# Patient Record
Sex: Female | Born: 1955 | Race: White | Hispanic: No | Smoking: Never smoker
Health system: Southern US, Community
[De-identification: ages and names within clinical notes are randomized; demographics above are authoritative.]

## PROBLEM LIST (undated history)

## (undated) DIAGNOSIS — M340 Progressive systemic sclerosis: Secondary | ICD-10-CM

## (undated) DIAGNOSIS — F410 Panic disorder [episodic paroxysmal anxiety] without agoraphobia: Secondary | ICD-10-CM

## (undated) DIAGNOSIS — C9590 Leukemia, unspecified not having achieved remission: Secondary | ICD-10-CM

## (undated) DIAGNOSIS — C9210 Chronic myeloid leukemia, BCR/ABL-positive, not having achieved remission: Secondary | ICD-10-CM

## (undated) DIAGNOSIS — Z9109 Other allergy status, other than to drugs and biological substances: Secondary | ICD-10-CM

## (undated) DIAGNOSIS — E119 Type 2 diabetes mellitus without complications: Secondary | ICD-10-CM

## (undated) DIAGNOSIS — M1990 Unspecified osteoarthritis, unspecified site: Secondary | ICD-10-CM

## (undated) DIAGNOSIS — E785 Hyperlipidemia, unspecified: Secondary | ICD-10-CM

## (undated) DIAGNOSIS — F32A Depression, unspecified: Secondary | ICD-10-CM

## (undated) DIAGNOSIS — D509 Iron deficiency anemia, unspecified: Secondary | ICD-10-CM

## (undated) DIAGNOSIS — I10 Essential (primary) hypertension: Secondary | ICD-10-CM

## (undated) DIAGNOSIS — Z8711 Personal history of peptic ulcer disease: Secondary | ICD-10-CM

## (undated) DIAGNOSIS — Z8719 Personal history of other diseases of the digestive system: Secondary | ICD-10-CM

## (undated) DIAGNOSIS — F329 Major depressive disorder, single episode, unspecified: Secondary | ICD-10-CM

## (undated) HISTORY — DX: Essential (primary) hypertension: I10

## (undated) HISTORY — DX: Hyperlipidemia, unspecified: E78.5

## (undated) HISTORY — DX: Personal history of other diseases of the digestive system: Z87.19

## (undated) HISTORY — PX: OTHER SURGICAL HISTORY: SHX169

## (undated) HISTORY — DX: Chronic myeloid leukemia, BCR/ABL-positive, not having achieved remission: C92.10

## (undated) HISTORY — DX: Panic disorder (episodic paroxysmal anxiety): F41.0

## (undated) HISTORY — PX: CARPAL TUNNEL RELEASE: SHX101

## (undated) HISTORY — PX: PARTIAL HYSTERECTOMY: SHX80

## (undated) HISTORY — DX: Other allergy status, other than to drugs and biological substances: Z91.09

## (undated) HISTORY — DX: Iron deficiency anemia, unspecified: D50.9

## (undated) HISTORY — DX: Progressive systemic sclerosis: M34.0

## (undated) HISTORY — DX: Personal history of peptic ulcer disease: Z87.11

## (undated) HISTORY — DX: Unspecified osteoarthritis, unspecified site: M19.90

## (undated) HISTORY — DX: Depression, unspecified: F32.A

## (undated) HISTORY — DX: Type 2 diabetes mellitus without complications: E11.9

## (undated) HISTORY — DX: Major depressive disorder, single episode, unspecified: F32.9

---

## 1988-02-26 HISTORY — PX: CARDIAC CATHETERIZATION: SHX172

## 2009-11-25 ENCOUNTER — Ambulatory Visit: Payer: Self-pay | Admitting: Internal Medicine

## 2009-12-08 ENCOUNTER — Ambulatory Visit: Payer: Self-pay | Admitting: Internal Medicine

## 2009-12-26 ENCOUNTER — Ambulatory Visit: Payer: Self-pay | Admitting: Internal Medicine

## 2010-01-02 ENCOUNTER — Ambulatory Visit: Payer: Self-pay | Admitting: Pain Medicine

## 2010-01-25 ENCOUNTER — Ambulatory Visit: Payer: Self-pay | Admitting: Internal Medicine

## 2010-02-25 ENCOUNTER — Ambulatory Visit: Payer: Self-pay | Admitting: Internal Medicine

## 2010-03-28 ENCOUNTER — Ambulatory Visit: Payer: Self-pay | Admitting: Internal Medicine

## 2010-04-22 ENCOUNTER — Emergency Department: Payer: Self-pay | Admitting: Emergency Medicine

## 2010-04-26 ENCOUNTER — Ambulatory Visit: Payer: Self-pay | Admitting: Internal Medicine

## 2010-05-08 ENCOUNTER — Ambulatory Visit: Payer: Self-pay | Admitting: Emergency Medicine

## 2010-05-10 ENCOUNTER — Ambulatory Visit: Payer: Self-pay | Admitting: Emergency Medicine

## 2010-05-27 ENCOUNTER — Ambulatory Visit: Payer: Self-pay | Admitting: Internal Medicine

## 2010-06-26 ENCOUNTER — Ambulatory Visit: Payer: Self-pay | Admitting: Internal Medicine

## 2010-08-27 ENCOUNTER — Ambulatory Visit: Payer: Self-pay | Admitting: Internal Medicine

## 2010-09-01 ENCOUNTER — Emergency Department: Payer: Self-pay | Admitting: Emergency Medicine

## 2010-09-02 ENCOUNTER — Emergency Department: Payer: Self-pay | Admitting: Emergency Medicine

## 2010-09-04 ENCOUNTER — Emergency Department: Payer: Self-pay | Admitting: Internal Medicine

## 2010-09-26 ENCOUNTER — Ambulatory Visit: Payer: Self-pay | Admitting: Internal Medicine

## 2010-10-27 ENCOUNTER — Ambulatory Visit: Payer: Self-pay

## 2010-10-27 ENCOUNTER — Ambulatory Visit: Payer: Self-pay | Admitting: Internal Medicine

## 2010-11-26 ENCOUNTER — Encounter: Payer: Self-pay | Admitting: Cardiovascular Disease

## 2010-11-26 ENCOUNTER — Ambulatory Visit: Payer: Self-pay | Admitting: Internal Medicine

## 2010-12-18 ENCOUNTER — Emergency Department: Payer: Self-pay | Admitting: Emergency Medicine

## 2010-12-27 ENCOUNTER — Ambulatory Visit: Payer: Self-pay | Admitting: Internal Medicine

## 2011-01-03 DIAGNOSIS — C9210 Chronic myeloid leukemia, BCR/ABL-positive, not having achieved remission: Secondary | ICD-10-CM | POA: Insufficient documentation

## 2011-01-15 DIAGNOSIS — M549 Dorsalgia, unspecified: Secondary | ICD-10-CM | POA: Insufficient documentation

## 2011-02-11 ENCOUNTER — Ambulatory Visit: Payer: Self-pay | Admitting: Internal Medicine

## 2011-02-26 ENCOUNTER — Ambulatory Visit: Payer: Self-pay | Admitting: Internal Medicine

## 2011-03-08 LAB — BASIC METABOLIC PANEL
Chloride: 105 mmol/L (ref 98–107)
Co2: 26 mmol/L (ref 21–32)
Creatinine: 0.75 mg/dL (ref 0.60–1.30)
EGFR (African American): >60
Osmolality: 284 (ref 275–301)
Potassium: 4.2 mmol/L (ref 3.5–5.1)
Sodium: 141 mmol/L (ref 136–145)

## 2011-03-08 LAB — URIC ACID: Uric Acid: 3.9 mg/dL (ref 2.6–6.0)

## 2011-03-08 LAB — HEPATIC FUNCTION PANEL A (ARMC)
Albumin: 3.3 g/dL — ABNORMAL LOW (ref 3.4–5.0)
Alkaline Phosphatase: 69 U/L (ref 50–136)
SGOT(AST): 20 U/L (ref 15–37)
Total Protein: 7.2 g/dL (ref 6.4–8.2)

## 2011-03-08 LAB — CBC CANCER CENTER
Basophil #: 0 x10 3/mm (ref 0.0–0.1)
Eosinophil #: 0.1 x10 3/mm (ref 0.0–0.7)
Eosinophil %: 1.8 %
HGB: 12.6 g/dL (ref 12.0–16.0)
Lymphocyte #: 1.7 x10 3/mm (ref 1.0–3.6)
MCH: 30.7 pg (ref 26.0–34.0)
MCHC: 33.9 g/dL (ref 32.0–36.0)
Monocyte #: 0.4 x10 3/mm (ref 0.0–0.7)
Neutrophil %: 64.5 %
Platelet: 262 x10 3/mm (ref 150–440)
RBC: 4.11 10*6/uL (ref 3.80–5.20)
RDW: 13.3 % (ref 11.5–14.5)

## 2011-03-08 LAB — MAGNESIUM: Magnesium: 1.5 mg/dL — ABNORMAL LOW

## 2011-03-08 LAB — PHOSPHORUS: Phosphorus: 2.4 mg/dL — ABNORMAL LOW (ref 2.5–4.9)

## 2011-03-15 ENCOUNTER — Ambulatory Visit: Payer: Self-pay | Admitting: Cardiovascular Disease

## 2011-03-26 LAB — CBC CANCER CENTER
Basophil #: 0 x10 3/mm (ref 0.0–0.1)
Eosinophil #: 0.1 x10 3/mm (ref 0.0–0.7)
Lymphocyte #: 1.3 x10 3/mm (ref 1.0–3.6)
Lymphocyte %: 17.1 %
MCV: 91 fL (ref 80–100)
Monocyte #: 0.5 x10 3/mm (ref 0.0–0.7)
Monocyte %: 6.3 %
Platelet: 337 x10 3/mm (ref 150–440)
RDW: 13.2 % (ref 11.5–14.5)
WBC: 7.8 x10 3/mm (ref 3.6–11.0)

## 2011-03-29 ENCOUNTER — Ambulatory Visit: Payer: Self-pay | Admitting: Internal Medicine

## 2011-04-02 ENCOUNTER — Encounter: Payer: Self-pay | Admitting: Cardiovascular Disease

## 2011-04-02 ENCOUNTER — Encounter: Payer: Self-pay | Admitting: *Deleted

## 2011-04-02 ENCOUNTER — Ambulatory Visit (INDEPENDENT_AMBULATORY_CARE_PROVIDER_SITE_OTHER): Payer: Medicaid Other | Admitting: Cardiovascular Disease

## 2011-04-02 VITALS — BP 141/85 | HR 91 | Ht 67.0 in | Wt 250.0 lb

## 2011-04-02 DIAGNOSIS — F419 Anxiety disorder, unspecified: Secondary | ICD-10-CM | POA: Insufficient documentation

## 2011-04-02 DIAGNOSIS — F329 Major depressive disorder, single episode, unspecified: Secondary | ICD-10-CM

## 2011-04-02 DIAGNOSIS — F341 Dysthymic disorder: Secondary | ICD-10-CM

## 2011-04-02 DIAGNOSIS — R079 Chest pain, unspecified: Secondary | ICD-10-CM

## 2011-04-02 DIAGNOSIS — G8929 Other chronic pain: Secondary | ICD-10-CM | POA: Insufficient documentation

## 2011-04-02 DIAGNOSIS — R0789 Other chest pain: Secondary | ICD-10-CM | POA: Insufficient documentation

## 2011-04-02 DIAGNOSIS — R9431 Abnormal electrocardiogram [ECG] [EKG]: Secondary | ICD-10-CM | POA: Insufficient documentation

## 2011-04-02 DIAGNOSIS — I10 Essential (primary) hypertension: Secondary | ICD-10-CM | POA: Insufficient documentation

## 2011-04-02 NOTE — Patient Instructions (Signed)
You are doing well. No medication changes were made. Please monitor your blood pressure if possible  Please call us if you have new issues that need to be addressed

## 2011-04-02 NOTE — Progress Notes (Signed)
Patient ID: Tara Levine, female    DOB: 11-01-1955, 56 y.o.   MRN: [id number]  HPI Comments: Tara Levine is a 56 year old woman, seen to establish care with Dr. 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 She reports having a cardiac catheterization 5 years ago that showed no significant coronary artery disease. This was done in [place]. She presents for abnormal EKG and chest pain.  Her main complaint today is her inability for anyone to listen to her and provide pain medications for her back pain. She was told that she could not be seen in pain clinic. She is changing primary care physicians. She does have psychiatry though they do not provide pain medication though do provide her benzodiazepines. She needs Xanax for sleep. She reports doing well previously on oxycodone.   Chest pain episodes are rare, seemed to come on at rest, nonexertional. She reports her mother had bypass surgery in her 70s.  EKG shows normal sinus rhythm with rate 92 beats per minute with poor R-wave progression through the anterior precordial leads, left axis deviation, no other significant abnormalities. This is unchanged from EKG at [facility] in January 2013.    Outpatient Encounter Prescriptions as of 04/02/2011  Medication Sig Dispense Refill  . ALPRAZolam (XANAX) 0.5 MG tablet Take 0.5 mg by mouth 3 (three) times daily as needed.      . cetirizine (ZYRTEC) 10 MG tablet Take 10 mg by mouth as needed.      . diphenhydrAMINE (BENADRYL) 25 MG tablet Take 25 mg by mouth daily.      Marland Kitchen docusate sodium (COLACE) 100 MG capsule Take 100 mg by mouth as directed.      . DULoxetine (CYMBALTA) 20 MG capsule Take 20 mg by mouth 3 (three) times daily.      Marland Kitchen  estradiol (ESTRACE) 2 MG tablet Take 2 mg by mouth daily.      . ferrous sulfate 325 (65 FE) MG tablet Take 325 mg by mouth.      . furosemide (LASIX) 20 MG tablet Take 20 mg by mouth daily as needed.      . hydrocortisone (ANUSOL-HC) 2.5 % rectal cream Place 1 application rectally daily.      . meclizine (ANTIVERT) 25 MG tablet Take 25 mg by mouth 3 (three) times daily as needed.      . Naproxen Sodium (ALEVE) 220 MG CAPS Take by mouth as needed.      . nilotinib (TASIGNA) 150 MG capsule Take 300 mg by mouth every 12 (twelve) hours.      Marland Kitchen olopatadine (PATANOL) 0.1 % ophthalmic solution Apply 1 drop to eye 2 (two) times daily.      . promethazine (PHENERGAN) 25 MG tablet Take 25 mg by mouth every 6 (six) hours as needed.      . senna (SENOKOT) 8.6 MG TABS Take 2 tablets by mouth.       . carisoprodol (SOMA) 350 MG tablet Take 350 mg by mouth 4 (four) times daily as needed. HAS NOT HAD RX IN 3 MONTH      . fluticasone (FLONASE) 50 MCG/ACT nasal spray Place 1 spray into the nose daily.      Marland Kitchen oxyCODONE (ROXICODONE) 15 MG immediate release tablet  Take 15 mg by mouth every 6 (six) hours as needed. HAS NOT HAD RX IN 3 MONTHS         Review of Systems  Constitutional: Positive for unexpected weight change.  HENT: Negative.   Eyes: Negative.   Respiratory: Negative.   Cardiovascular: Positive for chest pain.  Gastrointestinal: Negative.   Musculoskeletal: Positive for back pain, arthralgias and gait problem.  Skin: Negative.   Neurological: Negative.   Hematological: Negative.   Psychiatric/Behavioral: Negative.   All other systems reviewed and are negative.    BP 141/85  Pulse 91  Ht 5\' 7"  (1.702 m)  Wt 250 lb (113.399 kg)  BMI 39.16 kg/m2  Physical Exam  Nursing note and vitals reviewed. Constitutional: She is oriented to person, place, and time. She appears well-developed and well-nourished.  HENT:  Head: Normocephalic.  Nose: Nose normal.  Mouth/Throat: Oropharynx is clear  and moist.  Eyes: Conjunctivae are normal. Pupils are equal, round, and reactive to light.  Neck: Normal range of motion. Neck supple. No JVD present.  Cardiovascular: Normal rate, regular rhythm, S1 normal, S2 normal, normal heart sounds and intact distal pulses.  Exam reveals no gallop and no friction rub.   No murmur heard. Pulmonary/Chest: Effort normal and breath sounds normal. No respiratory distress. She has no wheezes. She has no rales. She exhibits no tenderness.  Abdominal: Soft. Bowel sounds are normal. She exhibits no distension. There is no tenderness.  Musculoskeletal: Normal range of motion. She exhibits no edema and no tenderness.  Lymphadenopathy:    She has no cervical adenopathy.  Neurological: She is alert and oriented to person, place, and time. Coordination normal.  Skin: Skin is warm and dry. No rash noted. No erythema.  Psychiatric: She has a normal mood and affect. Her behavior is normal. Judgment and thought content normal.         Assessment and Plan

## 2011-04-02 NOTE — Assessment & Plan Note (Signed)
History of chronic pain. No pain medications by her report over the past several months. She is trying to identify a source that can provide oxycodone for her symptoms.

## 2011-04-02 NOTE — Assessment & Plan Note (Signed)
EKG is essentially benign on today's visit and on EKG in January at [facility]. She has left axis deviation. 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

## 2011-04-02 NOTE — Assessment & Plan Note (Signed)
Chest pain symptoms are very rare, typically not associated with exertion. She reports recent cardiac catheterization history several years ago in [place] showing no significant coronary artery disease. We will confirm this and have requested her records. If in fact she has no history of significant coronary artery disease by recent cardiac catheterization, no further workup would be needed at this time. Symptoms are very atypical and in the setting of insignificant CAD, no further workup would be needed.

## 2011-04-02 NOTE — Assessment & Plan Note (Signed)
Blood pressure is borderline elevated today. She reports being on an ACE inhibitor previously. We have suggested she closely monitor her blood pressure and present her measurements to Dr. Leavy Cella.
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
## 2011-04-16 LAB — CBC CANCER CENTER
Basophil #: 0 x10 3/mm (ref 0.0–0.1)
Basophil %: 0.3 %
HCT: 38 % (ref 35.0–47.0)
Lymphocyte %: 23.3 %
Monocyte %: 6.4 %
Neutrophil #: 5.3 x10 3/mm (ref 1.4–6.5)
RBC: 4.18 10*6/uL (ref 3.80–5.20)
RDW: 14.4 % (ref 11.5–14.5)

## 2011-04-26 ENCOUNTER — Ambulatory Visit: Payer: Self-pay | Admitting: Internal Medicine

## 2011-05-27 DIAGNOSIS — E7800 Pure hypercholesterolemia, unspecified: Secondary | ICD-10-CM | POA: Insufficient documentation

## 2011-05-31 ENCOUNTER — Ambulatory Visit: Payer: Self-pay | Admitting: Internal Medicine

## 2011-05-31 LAB — CBC CANCER CENTER
Basophil %: 0.2 %
Eosinophil #: 0.2 x10 3/mm (ref 0.0–0.7)
HGB: 13.1 g/dL (ref 12.0–16.0)
Lymphocyte %: 20.5 %
MCHC: 33.5 g/dL (ref 32.0–36.0)
MCV: 91 fL (ref 80–100)
Monocyte #: 0.4 x10 3/mm (ref 0.0–0.7)
Monocyte %: 4.2 %
Neutrophil %: 72.7 %
RDW: 14.1 % (ref 11.5–14.5)

## 2011-05-31 LAB — MAGNESIUM: Magnesium: 1.7 mg/dL — ABNORMAL LOW

## 2011-05-31 LAB — POTASSIUM: Potassium: 4.2 mmol/L (ref 3.5–5.1)

## 2011-06-04 ENCOUNTER — Ambulatory Visit: Payer: Self-pay | Admitting: Pain Medicine

## 2011-06-26 ENCOUNTER — Ambulatory Visit: Payer: Self-pay | Admitting: Internal Medicine

## 2011-06-28 LAB — BASIC METABOLIC PANEL
Calcium, Total: 9.8 mg/dL (ref 8.5–10.1)
Chloride: 103 mmol/L (ref 98–107)
Creatinine: 0.81 mg/dL (ref 0.60–1.30)
EGFR (Non-African Amer.): >60

## 2011-06-28 LAB — CBC CANCER CENTER
Basophil #: 0 x10 3/mm (ref 0.0–0.1)
Eosinophil %: 3.4 %
HCT: 39.2 % (ref 35.0–47.0)
HGB: 13 g/dL (ref 12.0–16.0)
Lymphocyte #: 1.7 x10 3/mm (ref 1.0–3.6)
Lymphocyte %: 21.5 %
MCV: 92 fL (ref 80–100)
Monocyte #: 0.4 x10 3/mm (ref 0.2–0.9)
Monocyte %: 5.8 %
Neutrophil #: 5.3 x10 3/mm (ref 1.4–6.5)
Platelet: 266 x10 3/mm (ref 150–440)
RDW: 13.3 % (ref 11.5–14.5)

## 2011-06-28 LAB — HEPATIC FUNCTION PANEL A (ARMC)
Albumin: 3.7 g/dL (ref 3.4–5.0)
Bilirubin, Direct: 0.2 mg/dL (ref 0.00–0.20)
Bilirubin,Total: 0.3 mg/dL (ref 0.2–1.0)
SGOT(AST): 30 U/L (ref 15–37)
SGPT (ALT): 53 U/L
Total Protein: 7.4 g/dL (ref 6.4–8.2)

## 2011-06-28 LAB — PHOSPHORUS: Phosphorus: 2.4 mg/dL — ABNORMAL LOW (ref 2.5–4.9)

## 2011-07-26 LAB — CBC CANCER CENTER
Basophil #: 0 x10 3/mm (ref 0.0–0.1)
Basophil %: 0.2 %
Eosinophil #: 0.2 x10 3/mm (ref 0.0–0.7)
Eosinophil %: 2 %
HCT: 39.9 % (ref 35.0–47.0)
Lymphocyte #: 1.6 x10 3/mm (ref 1.0–3.6)
Lymphocyte %: 15.9 %
MCH: 30.1 pg (ref 26.0–34.0)
MCHC: 33.1 g/dL (ref 32.0–36.0)
MCV: 91 fL (ref 80–100)
Monocyte %: 4.8 %
Neutrophil %: 77.1 %
Platelet: 263 x10 3/mm (ref 150–440)
RDW: 13.3 % (ref 11.5–14.5)
WBC: 10.4 x10 3/mm (ref 3.6–11.0)

## 2011-07-26 LAB — BASIC METABOLIC PANEL
Anion Gap: 13 (ref 7–16)
Calcium, Total: 9.8 mg/dL (ref 8.5–10.1)
Co2: 22 mmol/L (ref 21–32)
Creatinine: 0.87 mg/dL (ref 0.60–1.30)
EGFR (African American): >60
EGFR (Non-African Amer.): >60
Potassium: 4.1 mmol/L (ref 3.5–5.1)
Sodium: 139 mmol/L (ref 136–145)

## 2011-07-26 LAB — HEPATIC FUNCTION PANEL A (ARMC)
Alkaline Phosphatase: 57 U/L (ref 50–136)
Bilirubin, Direct: <0.1 mg/dL (ref 0.00–0.20)
SGOT(AST): 25 U/L (ref 15–37)
SGPT (ALT): 27 U/L
Total Protein: 7.7 g/dL (ref 6.4–8.2)

## 2011-07-26 LAB — MAGNESIUM: Magnesium: 1.3 mg/dL — ABNORMAL LOW

## 2011-07-27 ENCOUNTER — Ambulatory Visit: Payer: Self-pay | Admitting: Internal Medicine

## 2011-08-23 LAB — BASIC METABOLIC PANEL
BUN: 22 mg/dL — ABNORMAL HIGH (ref 7–18)
Calcium, Total: 9.2 mg/dL (ref 8.5–10.1)
Chloride: 104 mmol/L (ref 98–107)
Creatinine: 0.8 mg/dL (ref 0.60–1.30)
EGFR (African American): >60
Glucose: 143 mg/dL — ABNORMAL HIGH (ref 65–99)
Potassium: 4.6 mmol/L (ref 3.5–5.1)

## 2011-08-23 LAB — CBC CANCER CENTER
Basophil %: 0.3 %
Eosinophil #: 0.2 x10 3/mm (ref 0.0–0.7)
HCT: 37.6 % (ref 35.0–47.0)
Lymphocyte %: 22 %
MCH: 29.3 pg (ref 26.0–34.0)
MCHC: 32.3 g/dL (ref 32.0–36.0)
MCV: 91 fL (ref 80–100)
Monocyte %: 5.8 %
Neutrophil #: 5.2 x10 3/mm (ref 1.4–6.5)
Neutrophil %: 69.4 %
Platelet: 233 x10 3/mm (ref 150–440)
RDW: 13.5 % (ref 11.5–14.5)

## 2011-08-23 LAB — HEPATIC FUNCTION PANEL A (ARMC)
Albumin: 3.3 g/dL — ABNORMAL LOW (ref 3.4–5.0)
Alkaline Phosphatase: 75 U/L (ref 50–136)
Bilirubin,Total: 0.3 mg/dL (ref 0.2–1.0)
SGOT(AST): 24 U/L (ref 15–37)
SGPT (ALT): 35 U/L

## 2011-08-23 LAB — MAGNESIUM: Magnesium: 1.9 mg/dL

## 2011-08-23 LAB — PHOSPHORUS: Phosphorus: 2.2 mg/dL — ABNORMAL LOW (ref 2.5–4.9)

## 2011-08-26 ENCOUNTER — Ambulatory Visit: Payer: Self-pay | Admitting: Internal Medicine

## 2011-09-20 LAB — HEPATIC FUNCTION PANEL A (ARMC)
Bilirubin, Direct: 0.1 mg/dL (ref 0.00–0.20)
Bilirubin,Total: 0.4 mg/dL (ref 0.2–1.0)
SGOT(AST): 19 U/L (ref 15–37)
Total Protein: 7.8 g/dL (ref 6.4–8.2)

## 2011-09-20 LAB — CBC CANCER CENTER
Basophil #: 0 x10 3/mm (ref 0.0–0.1)
Eosinophil #: 0.2 x10 3/mm (ref 0.0–0.7)
Eosinophil %: 2.2 %
HCT: 38.3 % (ref 35.0–47.0)
Lymphocyte #: 1.8 x10 3/mm (ref 1.0–3.6)
MCV: 90 fL (ref 80–100)
Monocyte #: 0.6 x10 3/mm (ref 0.2–0.9)
Monocyte %: 5.9 %
Neutrophil #: 6.8 x10 3/mm — ABNORMAL HIGH (ref 1.4–6.5)
Platelet: 279 x10 3/mm (ref 150–440)
RBC: 4.27 10*6/uL (ref 3.80–5.20)
WBC: 9.3 x10 3/mm (ref 3.6–11.0)

## 2011-09-20 LAB — BASIC METABOLIC PANEL
Anion Gap: 10 (ref 7–16)
Calcium, Total: 9.1 mg/dL (ref 8.5–10.1)
Chloride: 102 mmol/L (ref 98–107)
Co2: 24 mmol/L (ref 21–32)
EGFR (African American): >60
EGFR (Non-African Amer.): >60
Osmolality: 278 (ref 275–301)
Potassium: 4.4 mmol/L (ref 3.5–5.1)
Sodium: 136 mmol/L (ref 136–145)

## 2011-09-20 LAB — MAGNESIUM: Magnesium: 1.9 mg/dL

## 2011-09-20 LAB — PHOSPHORUS: Phosphorus: 2.3 mg/dL — ABNORMAL LOW (ref 2.5–4.9)

## 2011-09-26 ENCOUNTER — Ambulatory Visit: Payer: Self-pay | Admitting: Internal Medicine

## 2011-10-09 ENCOUNTER — Observation Stay: Payer: Self-pay | Admitting: Internal Medicine

## 2011-10-09 DIAGNOSIS — R079 Chest pain, unspecified: Secondary | ICD-10-CM

## 2011-10-09 DIAGNOSIS — R9431 Abnormal electrocardiogram [ECG] [EKG]: Secondary | ICD-10-CM

## 2011-10-09 LAB — COMPREHENSIVE METABOLIC PANEL
Albumin: 3.2 g/dL — ABNORMAL LOW (ref 3.4–5.0)
Anion Gap: 8 (ref 7–16)
BUN: 22 mg/dL — ABNORMAL HIGH (ref 7–18)
Bilirubin,Total: 0.2 mg/dL (ref 0.2–1.0)
Calcium, Total: 9.3 mg/dL (ref 8.5–10.1)
Chloride: 100 mmol/L (ref 98–107)
Co2: 29 mmol/L (ref 21–32)
Creatinine: 1.06 mg/dL (ref 0.60–1.30)
EGFR (African American): >60
EGFR (Non-African Amer.): 59 — ABNORMAL LOW
Glucose: 161 mg/dL — ABNORMAL HIGH (ref 65–99)
Potassium: 4.2 mmol/L (ref 3.5–5.1)
SGOT(AST): 22 U/L (ref 15–37)
SGPT (ALT): 28 U/L (ref 12–78)
Total Protein: 7.4 g/dL (ref 6.4–8.2)

## 2011-10-09 LAB — CBC CANCER CENTER
Basophil #: 0 x10 3/mm (ref 0.0–0.1)
Eosinophil #: 0.3 x10 3/mm (ref 0.0–0.7)
Eosinophil %: 4.2 %
HCT: 38.7 % (ref 35.0–47.0)
HGB: 12.6 g/dL (ref 12.0–16.0)
Lymphocyte #: 1.5 x10 3/mm (ref 1.0–3.6)
Lymphocyte %: 20.9 %
MCH: 29.2 pg (ref 26.0–34.0)
MCHC: 32.4 g/dL (ref 32.0–36.0)
MCV: 90 fL (ref 80–100)
Monocyte #: 0.4 x10 3/mm (ref 0.2–0.9)
Neutrophil %: 68.8 %
Platelet: 276 x10 3/mm (ref 150–440)
RBC: 4.3 10*6/uL (ref 3.80–5.20)
RDW: 13.8 % (ref 11.5–14.5)

## 2011-10-09 LAB — CK TOTAL AND CKMB (NOT AT ARMC)
CK, Total: 80 U/L (ref 21–215)
CK-MB: 1.4 ng/mL (ref 0.5–3.6)

## 2011-10-09 LAB — TROPONIN I
Troponin-I: <0.02 ng/mL
Troponin-I: <0.02 ng/mL

## 2011-10-09 LAB — MAGNESIUM: Magnesium: 1.7 mg/dL — ABNORMAL LOW

## 2011-10-10 DIAGNOSIS — R0602 Shortness of breath: Secondary | ICD-10-CM

## 2011-10-10 LAB — CBC WITH DIFFERENTIAL/PLATELET
HCT: 36.3 % (ref 35.0–47.0)
Lymphocyte #: 2.2 10*3/uL (ref 1.0–3.6)
MCH: 29.9 pg (ref 26.0–34.0)
MCHC: 33.6 g/dL (ref 32.0–36.0)
MCV: 89 fL (ref 80–100)
Monocyte #: 0.7 x10 3/mm (ref 0.2–0.9)
Neutrophil #: 4.5 10*3/uL (ref 1.4–6.5)
RDW: 13.4 % (ref 11.5–14.5)

## 2011-10-10 LAB — MAGNESIUM: Magnesium: 1.8 mg/dL

## 2011-10-10 LAB — BASIC METABOLIC PANEL
Anion Gap: 6 — ABNORMAL LOW (ref 7–16)
BUN: 27 mg/dL — ABNORMAL HIGH (ref 7–18)
Calcium, Total: 8.5 mg/dL (ref 8.5–10.1)
Co2: 28 mmol/L (ref 21–32)
EGFR (African American): >60
EGFR (Non-African Amer.): >60
Glucose: 128 mg/dL — ABNORMAL HIGH (ref 65–99)
Osmolality: 279 (ref 275–301)
Potassium: 4.2 mmol/L (ref 3.5–5.1)

## 2011-10-10 LAB — CK TOTAL AND CKMB (NOT AT ARMC): CK, Total: 75 U/L (ref 21–215)

## 2011-10-10 LAB — TROPONIN I: Troponin-I: <0.02 ng/mL

## 2011-10-18 LAB — BASIC METABOLIC PANEL WITH GFR
Anion Gap: 8
BUN: 23 mg/dL — ABNORMAL HIGH
Calcium, Total: 9.5 mg/dL
Chloride: 100 mmol/L
Co2: 28 mmol/L
Creatinine: 1.08 mg/dL
EGFR (African American): >60
EGFR (Non-African Amer.): 57 — ABNORMAL LOW
Glucose: 113 mg/dL — ABNORMAL HIGH
Osmolality: 276
Potassium: 4.5 mmol/L
Sodium: 136 mmol/L

## 2011-10-18 LAB — CBC CANCER CENTER
Basophil %: 0.2 %
Eosinophil #: 0.3 x10 3/mm (ref 0.0–0.7)
Eosinophil %: 2.6 %
HCT: 39.2 % (ref 35.0–47.0)
HGB: 12.8 g/dL (ref 12.0–16.0)
Lymphocyte %: 23.4 %
MCH: 29.2 pg (ref 26.0–34.0)
MCV: 90 fL (ref 80–100)
Monocyte #: 0.6 x10 3/mm (ref 0.2–0.9)
Monocyte %: 6.2 %
Neutrophil #: 6.8 x10 3/mm — ABNORMAL HIGH (ref 1.4–6.5)
Neutrophil %: 67.6 %
Platelet: 264 x10 3/mm (ref 150–440)
RBC: 4.37 10*6/uL (ref 3.80–5.20)
WBC: 10 x10 3/mm (ref 3.6–11.0)

## 2011-10-18 LAB — HEPATIC FUNCTION PANEL A (ARMC)
Albumin: 3.4 g/dL
Alkaline Phosphatase: 76 U/L
Bilirubin, Direct: 0.1 mg/dL
Bilirubin,Total: 0.2 mg/dL
SGOT(AST): 20 U/L
SGPT (ALT): 30 U/L
Total Protein: 7.6 g/dL

## 2011-10-18 LAB — MAGNESIUM: Magnesium: 1.6 mg/dL — ABNORMAL LOW

## 2011-10-18 LAB — PHOSPHORUS: Phosphorus: 2.7 mg/dL (ref 2.5–4.9)

## 2011-10-27 ENCOUNTER — Ambulatory Visit: Payer: Self-pay | Admitting: Internal Medicine

## 2011-10-31 ENCOUNTER — Encounter: Payer: Self-pay | Admitting: Cardiovascular Disease

## 2011-11-01 LAB — HEPATIC FUNCTION PANEL A (ARMC)
Albumin: 3.4 g/dL (ref 3.4–5.0)
Alkaline Phosphatase: 59 U/L (ref 50–136)
Bilirubin,Total: 0.4 mg/dL (ref 0.2–1.0)
SGOT(AST): 19 U/L (ref 15–37)
SGPT (ALT): 22 U/L (ref 12–78)
Total Protein: 7.6 g/dL (ref 6.4–8.2)

## 2011-11-01 LAB — CBC CANCER CENTER
Basophil %: 0.4 %
Eosinophil #: 0.2 x10 3/mm (ref 0.0–0.7)
Eosinophil %: 2.5 %
HCT: 37.3 % (ref 35.0–47.0)
HGB: 12.4 g/dL (ref 12.0–16.0)
Lymphocyte %: 19.9 %
MCH: 29.3 pg (ref 26.0–34.0)
MCHC: 33.1 g/dL (ref 32.0–36.0)
Monocyte #: 0.5 x10 3/mm (ref 0.2–0.9)
Neutrophil #: 6.2 x10 3/mm (ref 1.4–6.5)
RBC: 4.22 10*6/uL (ref 3.80–5.20)

## 2011-11-01 LAB — CREATININE, SERUM: EGFR (Non-African Amer.): 59 — ABNORMAL LOW

## 2011-11-01 LAB — POTASSIUM: Potassium: 4.4 mmol/L (ref 3.5–5.1)

## 2011-11-26 ENCOUNTER — Ambulatory Visit: Payer: Self-pay | Admitting: Internal Medicine

## 2011-11-29 ENCOUNTER — Emergency Department: Payer: Self-pay | Admitting: Unknown Physician Specialty

## 2011-11-29 LAB — URINALYSIS, COMPLETE
Bacteria: NONE SEEN
Bilirubin,UR: NEGATIVE
Glucose,UR: NEGATIVE mg/dL (ref 0–75)
Hyaline Cast: 4
Ketone: NEGATIVE
Leukocyte Esterase: NEGATIVE
Ph: 6 (ref 4.5–8.0)
RBC,UR: 1 /HPF (ref 0–5)
Squamous Epithelial: 1
WBC UR: <1 /HPF (ref 0–5)

## 2011-11-29 LAB — CBC CANCER CENTER
Basophil #: 0 x10 3/mm (ref 0.0–0.1)
Basophil %: 0.3 %
Eosinophil #: 0.2 x10 3/mm (ref 0.0–0.7)
Eosinophil %: 1.4 %
HCT: 39.5 % (ref 35.0–47.0)
HGB: 12.9 g/dL (ref 12.0–16.0)
Lymphocyte #: 2 x10 3/mm (ref 1.0–3.6)
Lymphocyte %: 17.3 %
MCH: 29 pg (ref 26.0–34.0)
MCV: 89 fL (ref 80–100)
Monocyte #: 0.7 x10 3/mm (ref 0.2–0.9)
Monocyte %: 5.8 %
Neutrophil #: 8.7 x10 3/mm — ABNORMAL HIGH (ref 1.4–6.5)
Neutrophil %: 75.2 %
RBC: 4.44 10*6/uL (ref 3.80–5.20)
RDW: 13.8 % (ref 11.5–14.5)
WBC: 11.6 x10 3/mm — ABNORMAL HIGH (ref 3.6–11.0)

## 2011-11-29 LAB — CBC
MCH: 28.8 pg (ref 26.0–34.0)
MCHC: 32.7 g/dL (ref 32.0–36.0)
Platelet: 303 10*3/uL (ref 150–440)
RBC: 4.31 10*6/uL (ref 3.80–5.20)

## 2011-11-29 LAB — COMPREHENSIVE METABOLIC PANEL
Albumin: 3.5 g/dL (ref 3.4–5.0)
Alkaline Phosphatase: 68 U/L (ref 50–136)
BUN: 16 mg/dL (ref 7–18)
Bilirubin,Total: 0.4 mg/dL (ref 0.2–1.0)
Calcium, Total: 9.5 mg/dL (ref 8.5–10.1)
Creatinine: 1.04 mg/dL (ref 0.60–1.30)
EGFR (African American): >60
Glucose: 105 mg/dL — ABNORMAL HIGH (ref 65–99)
SGOT(AST): 27 U/L (ref 15–37)
SGPT (ALT): 26 U/L (ref 12–78)
Total Protein: 7.7 g/dL (ref 6.4–8.2)

## 2011-11-29 LAB — TROPONIN I: Troponin-I: <0.02 ng/mL

## 2011-11-29 LAB — LIPASE, BLOOD: Lipase: 138 U/L (ref 73–393)

## 2011-12-13 LAB — CBC CANCER CENTER
Basophil #: 0 x10 3/mm (ref 0.0–0.1)
Eosinophil #: 0.3 x10 3/mm (ref 0.0–0.7)
HCT: 36.3 % (ref 35.0–47.0)
Lymphocyte %: 23.2 %
MCH: 28.8 pg (ref 26.0–34.0)
MCHC: 32.2 g/dL (ref 32.0–36.0)
Neutrophil #: 4.3 x10 3/mm (ref 1.4–6.5)
Platelet: 269 x10 3/mm (ref 150–440)
RDW: 13.8 % (ref 11.5–14.5)
WBC: 6.6 x10 3/mm (ref 3.6–11.0)

## 2011-12-13 LAB — MAGNESIUM: Magnesium: 1.6 mg/dL — ABNORMAL LOW

## 2011-12-13 LAB — HEPATIC FUNCTION PANEL A (ARMC)
Bilirubin, Direct: 0.1 mg/dL (ref 0.00–0.20)
Bilirubin,Total: 0.4 mg/dL (ref 0.2–1.0)
SGPT (ALT): 23 U/L (ref 12–78)
Total Protein: 7.5 g/dL (ref 6.4–8.2)

## 2011-12-13 LAB — POTASSIUM: Potassium: 4.4 mmol/L (ref 3.5–5.1)

## 2011-12-13 LAB — CREATININE, SERUM
Creatinine: 0.98 mg/dL (ref 0.60–1.30)
EGFR (Non-African Amer.): >60

## 2011-12-13 LAB — PHOSPHORUS: Phosphorus: 2.3 mg/dL — ABNORMAL LOW (ref 2.5–4.9)

## 2011-12-27 ENCOUNTER — Ambulatory Visit: Payer: Self-pay

## 2011-12-27 ENCOUNTER — Ambulatory Visit: Payer: Self-pay | Admitting: Internal Medicine

## 2012-01-15 LAB — CBC CANCER CENTER
Basophil #: 0 x10 3/mm (ref 0.0–0.1)
HCT: 36.6 % (ref 35.0–47.0)
HGB: 12.2 g/dL (ref 12.0–16.0)
Lymphocyte #: 1.7 x10 3/mm (ref 1.0–3.6)
MCHC: 33.5 g/dL (ref 32.0–36.0)
MCV: 89 fL (ref 80–100)
Monocyte #: 0.4 x10 3/mm (ref 0.2–0.9)
Monocyte %: 4.5 %
Neutrophil #: 6.1 x10 3/mm (ref 1.4–6.5)
RDW: 13.6 % (ref 11.5–14.5)

## 2012-01-24 LAB — CBC CANCER CENTER
Basophil %: 0.4 %
Eosinophil #: 0.2 x10 3/mm (ref 0.0–0.7)
HCT: 34.2 % — ABNORMAL LOW (ref 35.0–47.0)
Lymphocyte #: 1.7 x10 3/mm (ref 1.0–3.6)
MCH: 29.2 pg (ref 26.0–34.0)
MCHC: 32.9 g/dL (ref 32.0–36.0)
Monocyte #: 0.5 x10 3/mm (ref 0.2–0.9)
Neutrophil #: 5.9 x10 3/mm (ref 1.4–6.5)
Neutrophil %: 70.9 %
Platelet: 245 x10 3/mm (ref 150–440)
RBC: 3.85 10*6/uL (ref 3.80–5.20)

## 2012-01-24 LAB — CREATININE, SERUM
EGFR (African American): 51 — ABNORMAL LOW
EGFR (Non-African Amer.): 44 — ABNORMAL LOW

## 2012-01-24 LAB — HEPATIC FUNCTION PANEL A (ARMC): Bilirubin, Direct: 0.1 mg/dL (ref 0.00–0.20)

## 2012-01-26 ENCOUNTER — Ambulatory Visit: Payer: Self-pay | Admitting: Internal Medicine

## 2012-02-07 LAB — URINALYSIS, COMPLETE
Bacteria: NONE SEEN
Glucose,UR: NEGATIVE mg/dL (ref 0–75)
Hyaline Cast: 3
Leukocyte Esterase: NEGATIVE
Nitrite: NEGATIVE
Ph: 5 (ref 4.5–8.0)
Specific Gravity: 1.006 (ref 1.003–1.030)
Squamous Epithelial: NONE SEEN
WBC UR: 1 /HPF (ref 0–5)

## 2012-02-07 LAB — HEPATIC FUNCTION PANEL A (ARMC)
Albumin: 3.2 g/dL — ABNORMAL LOW (ref 3.4–5.0)
Alkaline Phosphatase: 66 U/L (ref 50–136)
Bilirubin,Total: 0.5 mg/dL (ref 0.2–1.0)
SGOT(AST): 22 U/L (ref 15–37)
SGPT (ALT): 26 U/L (ref 12–78)
Total Protein: 7.3 g/dL (ref 6.4–8.2)

## 2012-02-07 LAB — CBC CANCER CENTER
Basophil #: 0 x10 3/mm (ref 0.0–0.1)
Lymphocyte #: 1.7 x10 3/mm (ref 1.0–3.6)
MCH: 29.7 pg (ref 26.0–34.0)
MCV: 88 fL (ref 80–100)
Monocyte #: 0.6 x10 3/mm (ref 0.2–0.9)
Monocyte %: 3.9 %
Neutrophil %: 82.7 %
Platelet: 290 x10 3/mm (ref 150–440)
RDW: 13.2 % (ref 11.5–14.5)
WBC: 15 x10 3/mm — ABNORMAL HIGH (ref 3.6–11.0)

## 2012-02-07 LAB — BASIC METABOLIC PANEL
BUN: 17 mg/dL (ref 7–18)
Calcium, Total: 9.3 mg/dL (ref 8.5–10.1)
Chloride: 99 mmol/L (ref 98–107)
Co2: 24 mmol/L (ref 21–32)
Creatinine: 1.2 mg/dL (ref 0.60–1.30)
EGFR (African American): 59 — ABNORMAL LOW
Potassium: 4.2 mmol/L (ref 3.5–5.1)
Sodium: 134 mmol/L — ABNORMAL LOW (ref 136–145)

## 2012-02-07 LAB — MAGNESIUM: Magnesium: 1.5 mg/dL — ABNORMAL LOW

## 2012-02-08 LAB — URINE CULTURE

## 2012-02-17 DIAGNOSIS — M545 Low back pain, unspecified: Secondary | ICD-10-CM | POA: Insufficient documentation

## 2012-02-17 DIAGNOSIS — M542 Cervicalgia: Secondary | ICD-10-CM | POA: Insufficient documentation

## 2012-02-26 ENCOUNTER — Ambulatory Visit: Payer: Self-pay | Admitting: Internal Medicine

## 2012-03-26 DIAGNOSIS — L03319 Cellulitis of trunk, unspecified: Secondary | ICD-10-CM | POA: Insufficient documentation

## 2012-04-02 ENCOUNTER — Ambulatory Visit: Payer: Self-pay | Admitting: Internal Medicine

## 2012-04-02 ENCOUNTER — Ambulatory Visit: Payer: Self-pay | Admitting: Unknown Physician Specialty

## 2012-04-03 ENCOUNTER — Ambulatory Visit: Payer: Self-pay | Admitting: Internal Medicine

## 2012-04-03 LAB — HEPATIC FUNCTION PANEL A (ARMC)
Albumin: 3.3 g/dL — ABNORMAL LOW (ref 3.4–5.0)
Alkaline Phosphatase: 73 U/L (ref 50–136)
Bilirubin,Total: 0.3 mg/dL (ref 0.2–1.0)
SGOT(AST): 27 U/L (ref 15–37)
Total Protein: 7.7 g/dL (ref 6.4–8.2)

## 2012-04-03 LAB — CBC CANCER CENTER
Basophil #: 0 x10 3/mm (ref 0.0–0.1)
Eosinophil #: 0.2 x10 3/mm (ref 0.0–0.7)
Eosinophil %: 2.3 %
HCT: 37.9 % (ref 35.0–47.0)
MCH: 29.3 pg (ref 26.0–34.0)
MCHC: 33.5 g/dL (ref 32.0–36.0)
MCV: 87 fL (ref 80–100)
Monocyte #: 0.4 x10 3/mm (ref 0.2–0.9)
Neutrophil %: 75.1 %
Platelet: 294 x10 3/mm (ref 150–440)
RBC: 4.33 10*6/uL (ref 3.80–5.20)
RDW: 13.5 % (ref 11.5–14.5)
WBC: 8 x10 3/mm (ref 3.6–11.0)

## 2012-04-03 LAB — BASIC METABOLIC PANEL
Anion Gap: 9 (ref 7–16)
BUN: 15 mg/dL (ref 7–18)
Calcium, Total: 8.8 mg/dL (ref 8.5–10.1)
Chloride: 100 mmol/L (ref 98–107)
Co2: 28 mmol/L (ref 21–32)
EGFR (Non-African Amer.): >60
Osmolality: 277 (ref 275–301)
Sodium: 137 mmol/L (ref 136–145)

## 2012-04-03 LAB — PATHOLOGY REPORT

## 2012-04-25 ENCOUNTER — Ambulatory Visit: Payer: Self-pay | Admitting: Internal Medicine

## 2012-05-08 ENCOUNTER — Ambulatory Visit: Payer: Self-pay | Admitting: Internal Medicine

## 2012-05-08 DIAGNOSIS — I4581 Long QT syndrome: Secondary | ICD-10-CM

## 2012-05-08 LAB — BASIC METABOLIC PANEL
Anion Gap: 8 (ref 7–16)
Calcium, Total: 8.3 mg/dL — ABNORMAL LOW (ref 8.5–10.1)
Chloride: 102 mmol/L (ref 98–107)
Co2: 28 mmol/L (ref 21–32)
EGFR (African American): >60
Potassium: 4.1 mmol/L (ref 3.5–5.1)
Sodium: 138 mmol/L (ref 136–145)

## 2012-05-08 LAB — MAGNESIUM: Magnesium: 1.4 mg/dL — ABNORMAL LOW

## 2012-05-08 LAB — CBC CANCER CENTER
Basophil #: 0 x10 3/mm (ref 0.0–0.1)
Basophil %: 0.2 %
Eosinophil #: 0.1 x10 3/mm (ref 0.0–0.7)
HCT: 36.8 % (ref 35.0–47.0)
HGB: 12.2 g/dL (ref 12.0–16.0)
Lymphocyte #: 1.2 x10 3/mm (ref 1.0–3.6)
Lymphocyte %: 13.6 %
MCHC: 33.1 g/dL (ref 32.0–36.0)
MCV: 88 fL (ref 80–100)
RDW: 13.8 % (ref 11.5–14.5)

## 2012-05-08 LAB — HEPATIC FUNCTION PANEL A (ARMC)
Albumin: 3.1 g/dL — ABNORMAL LOW (ref 3.4–5.0)
Alkaline Phosphatase: 67 U/L (ref 50–136)
Bilirubin, Direct: <0.05 mg/dL (ref 0.00–0.20)
SGPT (ALT): 23 U/L (ref 12–78)

## 2012-05-08 LAB — PHOSPHORUS: Phosphorus: 1.4 mg/dL — ABNORMAL LOW (ref 2.5–4.9)

## 2012-05-21 LAB — CBC WITH DIFFERENTIAL/PLATELET
Basophil %: 0.5 %
Eosinophil #: 0.2 10*3/uL (ref 0.0–0.7)
Eosinophil %: 0.9 %
HCT: 37.4 % (ref 35.0–47.0)
HGB: 12.2 g/dL (ref 12.0–16.0)
Lymphocyte #: 1.1 10*3/uL (ref 1.0–3.6)
Lymphocyte %: 5.3 %
MCH: 28.7 pg (ref 26.0–34.0)
MCHC: 32.7 g/dL (ref 32.0–36.0)
MCV: 88 fL (ref 80–100)
Monocyte #: 0.6 x10 3/mm (ref 0.2–0.9)
Monocyte %: 2.9 %
Neutrophil #: 18 10*3/uL — ABNORMAL HIGH (ref 1.4–6.5)
Neutrophil %: 90.4 %
Platelet: 281 10*3/uL (ref 150–440)
WBC: 20 10*3/uL — ABNORMAL HIGH (ref 3.6–11.0)

## 2012-05-21 LAB — COMPREHENSIVE METABOLIC PANEL
Alkaline Phosphatase: 73 U/L (ref 50–136)
Anion Gap: 7 (ref 7–16)
BUN: 19 mg/dL — ABNORMAL HIGH (ref 7–18)
Calcium, Total: 9 mg/dL (ref 8.5–10.1)
Creatinine: 0.91 mg/dL (ref 0.60–1.30)
EGFR (Non-African Amer.): >60
Glucose: 135 mg/dL — ABNORMAL HIGH (ref 65–99)
SGOT(AST): 23 U/L (ref 15–37)
SGPT (ALT): 27 U/L (ref 12–78)
Total Protein: 7.5 g/dL (ref 6.4–8.2)

## 2012-05-21 LAB — LIPASE, BLOOD: Lipase: 180 U/L (ref 73–393)

## 2012-05-22 ENCOUNTER — Inpatient Hospital Stay: Payer: Self-pay | Admitting: Internal Medicine

## 2012-05-23 LAB — BASIC METABOLIC PANEL
Anion Gap: 6 — ABNORMAL LOW (ref 7–16)
BUN: 15 mg/dL (ref 7–18)
Calcium, Total: 7.7 mg/dL — ABNORMAL LOW (ref 8.5–10.1)
Chloride: 104 mmol/L (ref 98–107)
Creatinine: 1.08 mg/dL (ref 0.60–1.30)
Glucose: 119 mg/dL — ABNORMAL HIGH (ref 65–99)
Potassium: 3.6 mmol/L (ref 3.5–5.1)
Sodium: 137 mmol/L (ref 136–145)

## 2012-05-23 LAB — CBC WITH DIFFERENTIAL/PLATELET
Eosinophil %: 2.8 %
HCT: 31.8 % — ABNORMAL LOW (ref 35.0–47.0)
HGB: 10.4 g/dL — ABNORMAL LOW (ref 12.0–16.0)
MCH: 29.1 pg (ref 26.0–34.0)
MCHC: 32.8 g/dL (ref 32.0–36.0)
MCV: 89 fL (ref 80–100)
Monocyte %: 4.8 %
Neutrophil #: 5.8 10*3/uL (ref 1.4–6.5)
WBC: 7.7 10*3/uL (ref 3.6–11.0)

## 2012-05-24 LAB — COMPREHENSIVE METABOLIC PANEL
Alkaline Phosphatase: 68 U/L (ref 50–136)
Anion Gap: 10 (ref 7–16)
Bilirubin,Total: 0.2 mg/dL (ref 0.2–1.0)
Calcium, Total: 7.4 mg/dL — ABNORMAL LOW (ref 8.5–10.1)
Chloride: 100 mmol/L (ref 98–107)
Creatinine: 1.11 mg/dL (ref 0.60–1.30)
EGFR (Non-African Amer.): 55 — ABNORMAL LOW
Glucose: 140 mg/dL — ABNORMAL HIGH (ref 65–99)
Osmolality: 269 (ref 275–301)
Potassium: 4.2 mmol/L (ref 3.5–5.1)

## 2012-05-24 LAB — CBC WITH DIFFERENTIAL/PLATELET
Basophil #: 0 10*3/uL (ref 0.0–0.1)
Eosinophil %: 1.6 %
HCT: 29.1 % — ABNORMAL LOW (ref 35.0–47.0)
Lymphocyte %: 12.9 %
MCH: 30.2 pg (ref 26.0–34.0)
MCHC: 33.9 g/dL (ref 32.0–36.0)
MCV: 89 fL (ref 80–100)
Monocyte %: 5.6 %
Neutrophil #: 7.1 10*3/uL — ABNORMAL HIGH (ref 1.4–6.5)
Neutrophil %: 79.8 %
RBC: 3.27 10*6/uL — ABNORMAL LOW (ref 3.80–5.20)
RDW: 13.7 % (ref 11.5–14.5)

## 2012-05-25 LAB — BASIC METABOLIC PANEL
Anion Gap: 9 (ref 7–16)
Calcium, Total: 7.2 mg/dL — ABNORMAL LOW (ref 8.5–10.1)
Creatinine: 1.46 mg/dL — ABNORMAL HIGH (ref 0.60–1.30)
EGFR (African American): 46 — ABNORMAL LOW
EGFR (Non-African Amer.): 40 — ABNORMAL LOW
Glucose: 165 mg/dL — ABNORMAL HIGH (ref 65–99)
Osmolality: 274 (ref 275–301)
Sodium: 134 mmol/L — ABNORMAL LOW (ref 136–145)

## 2012-05-25 LAB — IRON AND TIBC
Iron Bind.Cap.(Total): 295 ug/dL (ref 250–450)
Unbound Iron-Bind.Cap.: 271 ug/dL

## 2012-05-25 LAB — FERRITIN: Ferritin (ARMC): 88 ng/mL (ref 8–388)

## 2012-05-25 LAB — CBC WITH DIFFERENTIAL/PLATELET
Basophil #: 0 10*3/uL (ref 0.0–0.1)
Eosinophil #: 0 10*3/uL (ref 0.0–0.7)
Eosinophil %: 0.1 %
HGB: 8.9 g/dL — ABNORMAL LOW (ref 12.0–16.0)
Lymphocyte #: 0.8 10*3/uL — ABNORMAL LOW (ref 1.0–3.6)
MCH: 29.7 pg (ref 26.0–34.0)
MCHC: 34.2 g/dL (ref 32.0–36.0)
MCV: 87 fL (ref 80–100)
Monocyte %: 3.4 %
Neutrophil #: 13.7 10*3/uL — ABNORMAL HIGH (ref 1.4–6.5)
RBC: 3 10*6/uL — ABNORMAL LOW (ref 3.80–5.20)
WBC: 15.1 10*3/uL — ABNORMAL HIGH (ref 3.6–11.0)

## 2012-05-26 ENCOUNTER — Ambulatory Visit: Payer: Self-pay | Admitting: Internal Medicine

## 2012-05-26 LAB — CBC WITH DIFFERENTIAL/PLATELET
Basophil #: 0 10*3/uL (ref 0.0–0.1)
Eosinophil #: 0.3 10*3/uL (ref 0.0–0.7)
Eosinophil %: 2.7 %
HCT: 24.2 % — ABNORMAL LOW (ref 35.0–47.0)
MCV: 88 fL (ref 80–100)
Monocyte #: 0.4 x10 3/mm (ref 0.2–0.9)
Monocyte %: 3.8 %
WBC: 9.8 10*3/uL (ref 3.6–11.0)

## 2012-05-26 LAB — BASIC METABOLIC PANEL
Calcium, Total: 7.2 mg/dL — ABNORMAL LOW (ref 8.5–10.1)
Chloride: 106 mmol/L (ref 98–107)
Creatinine: 0.97 mg/dL (ref 0.60–1.30)
Osmolality: 277 (ref 275–301)
Sodium: 138 mmol/L (ref 136–145)

## 2012-05-26 LAB — MAGNESIUM: Magnesium: 1.9 mg/dL

## 2012-05-27 LAB — BASIC METABOLIC PANEL
Anion Gap: 8 (ref 7–16)
Anion Gap: 8 (ref 7–16)
BUN: 18 mg/dL (ref 7–18)
Chloride: 107 mmol/L (ref 98–107)
Co2: 23 mmol/L (ref 21–32)
EGFR (African American): >60
EGFR (Non-African Amer.): 58 — ABNORMAL LOW
Glucose: 269 mg/dL — ABNORMAL HIGH (ref 65–99)
Osmolality: 285 (ref 275–301)
Potassium: 4.7 mmol/L (ref 3.5–5.1)

## 2012-05-27 LAB — CBC WITH DIFFERENTIAL/PLATELET
Basophil #: 0 10*3/uL (ref 0.0–0.1)
Eosinophil %: 0 %
HGB: 8.7 g/dL — ABNORMAL LOW (ref 12.0–16.0)
Lymphocyte %: 5.4 %
MCH: 28.2 pg (ref 26.0–34.0)
Monocyte #: 0.2 x10 3/mm (ref 0.2–0.9)
Monocyte %: 2.2 %
Neutrophil #: 7.7 10*3/uL — ABNORMAL HIGH (ref 1.4–6.5)
Neutrophil %: 92.3 %
RBC: 3.09 10*6/uL — ABNORMAL LOW (ref 3.80–5.20)
RDW: 14.4 % (ref 11.5–14.5)
WBC: 8.4 10*3/uL (ref 3.6–11.0)

## 2012-05-27 LAB — CULTURE, BLOOD (SINGLE)

## 2012-05-28 DIAGNOSIS — J984 Other disorders of lung: Secondary | ICD-10-CM

## 2012-05-28 DIAGNOSIS — I469 Cardiac arrest, cause unspecified: Secondary | ICD-10-CM

## 2012-05-28 DIAGNOSIS — I498 Other specified cardiac arrhythmias: Secondary | ICD-10-CM

## 2012-05-28 LAB — CBC WITH DIFFERENTIAL/PLATELET
Basophil #: 0 10*3/uL (ref 0.0–0.1)
Basophil %: 0.1 %
Eosinophil #: 0 10*3/uL (ref 0.0–0.7)
Eosinophil %: 0 %
HCT: 26.7 % — ABNORMAL LOW (ref 35.0–47.0)
HGB: 8.9 g/dL — ABNORMAL LOW (ref 12.0–16.0)
Lymphocyte #: 0.8 10*3/uL — ABNORMAL LOW (ref 1.0–3.6)
Lymphocyte %: 8.1 %
MCHC: 33.4 g/dL (ref 32.0–36.0)
Monocyte #: 0.4 x10 3/mm (ref 0.2–0.9)
Neutrophil #: 9.1 10*3/uL — ABNORMAL HIGH (ref 1.4–6.5)
Neutrophil %: 88.3 %
RBC: 3.03 10*6/uL — ABNORMAL LOW (ref 3.80–5.20)
RDW: 14.2 % (ref 11.5–14.5)
WBC: 10.3 10*3/uL (ref 3.6–11.0)

## 2012-05-28 LAB — BASIC METABOLIC PANEL
Anion Gap: 7 (ref 7–16)
BUN: 18 mg/dL (ref 7–18)
Chloride: 109 mmol/L — ABNORMAL HIGH (ref 98–107)
Co2: 23 mmol/L (ref 21–32)
Creatinine: 0.8 mg/dL (ref 0.60–1.30)
EGFR (Non-African Amer.): >60
Glucose: 129 mg/dL — ABNORMAL HIGH (ref 65–99)
Potassium: 4.3 mmol/L (ref 3.5–5.1)

## 2012-05-28 LAB — ALBUMIN: Albumin: 2.1 g/dL — ABNORMAL LOW (ref 3.4–5.0)

## 2012-05-29 DIAGNOSIS — I498 Other specified cardiac arrhythmias: Secondary | ICD-10-CM

## 2012-05-29 LAB — CBC WITH DIFFERENTIAL/PLATELET
Basophil #: 0 10*3/uL (ref 0.0–0.1)
Basophil %: 0.1 %
Eosinophil %: 0 %
HGB: 9 g/dL — ABNORMAL LOW (ref 12.0–16.0)
Lymphocyte #: 0.8 10*3/uL — ABNORMAL LOW (ref 1.0–3.6)
Lymphocyte %: 6.7 %
MCHC: 32.2 g/dL (ref 32.0–36.0)
MCV: 89 fL (ref 80–100)
Monocyte #: 0.3 x10 3/mm (ref 0.2–0.9)
Monocyte %: 2.7 %
Neutrophil #: 10.7 10*3/uL — ABNORMAL HIGH (ref 1.4–6.5)
Platelet: 279 10*3/uL (ref 150–440)
RBC: 3.16 10*6/uL — ABNORMAL LOW (ref 3.80–5.20)
RDW: 14.4 % (ref 11.5–14.5)
WBC: 11.8 10*3/uL — ABNORMAL HIGH (ref 3.6–11.0)

## 2012-05-29 LAB — BASIC METABOLIC PANEL
Anion Gap: 6 — ABNORMAL LOW (ref 7–16)
BUN: 24 mg/dL — ABNORMAL HIGH (ref 7–18)
Calcium, Total: 7.5 mg/dL — ABNORMAL LOW (ref 8.5–10.1)
Co2: 26 mmol/L (ref 21–32)
EGFR (African American): >60
EGFR (Non-African Amer.): >60
Potassium: 4.6 mmol/L (ref 3.5–5.1)
Sodium: 138 mmol/L (ref 136–145)

## 2012-05-29 LAB — TSH: Thyroid Stimulating Horm: 0.4 u[IU]/mL — ABNORMAL LOW

## 2012-05-29 LAB — TRIGLYCERIDES: Triglycerides: 374 mg/dL — ABNORMAL HIGH (ref 0–200)

## 2012-05-29 LAB — EXPECTORATED SPUTUM ASSESSMENT W GRAM STAIN, RFLX TO RESP C

## 2012-05-30 LAB — CBC WITH DIFFERENTIAL/PLATELET
Bands: 3 %
HCT: 29.3 % — ABNORMAL LOW (ref 35.0–47.0)
HGB: 9.7 g/dL — ABNORMAL LOW (ref 12.0–16.0)
Lymphocytes: 5 %
MCH: 29.5 pg (ref 26.0–34.0)
MCHC: 32.9 g/dL (ref 32.0–36.0)
MCV: 90 fL (ref 80–100)
Monocytes: 1 %
NRBC/100 WBC: 1 /
RDW: 14.2 % (ref 11.5–14.5)
WBC: 10.7 10*3/uL (ref 3.6–11.0)

## 2012-05-30 LAB — BASIC METABOLIC PANEL
Creatinine: 0.85 mg/dL (ref 0.60–1.30)
EGFR (African American): >60
Osmolality: 287 (ref 275–301)
Potassium: 4.7 mmol/L (ref 3.5–5.1)
Sodium: 140 mmol/L (ref 136–145)

## 2012-05-30 LAB — PHOSPHORUS: Phosphorus: 2.8 mg/dL (ref 2.5–4.9)

## 2012-05-31 LAB — CBC WITH DIFFERENTIAL/PLATELET
Bands: 3 %
HCT: 29.5 % — ABNORMAL LOW (ref 35.0–47.0)
HGB: 9.8 g/dL — ABNORMAL LOW (ref 12.0–16.0)
Lymphocytes: 11 %
MCH: 29.7 pg (ref 26.0–34.0)
MCHC: 33.1 g/dL (ref 32.0–36.0)
Monocytes: 4 %
Myelocyte: 1 %
Platelet: 306 10*3/uL (ref 150–440)
RBC: 3.29 10*6/uL — ABNORMAL LOW (ref 3.80–5.20)
RDW: 14.4 % (ref 11.5–14.5)
Segmented Neutrophils: 80 %
WBC: 12.4 10*3/uL — ABNORMAL HIGH (ref 3.6–11.0)

## 2012-05-31 LAB — BASIC METABOLIC PANEL
Anion Gap: 4 — ABNORMAL LOW (ref 7–16)
BUN: 34 mg/dL — ABNORMAL HIGH (ref 7–18)
Calcium, Total: 8.8 mg/dL (ref 8.5–10.1)
Chloride: 103 mmol/L (ref 98–107)
Co2: 30 mmol/L (ref 21–32)
EGFR (African American): >60
EGFR (Non-African Amer.): >60
Glucose: 141 mg/dL — ABNORMAL HIGH (ref 65–99)
Potassium: 4.8 mmol/L (ref 3.5–5.1)

## 2012-06-01 LAB — TRIGLYCERIDES: Triglycerides: 418 mg/dL — ABNORMAL HIGH (ref 0–200)

## 2012-06-01 LAB — BASIC METABOLIC PANEL
Anion Gap: 6 — ABNORMAL LOW (ref 7–16)
Co2: 31 mmol/L (ref 21–32)
EGFR (Non-African Amer.): 50 — ABNORMAL LOW
Osmolality: 281 (ref 275–301)
Potassium: 5 mmol/L (ref 3.5–5.1)
Sodium: 133 mmol/L — ABNORMAL LOW (ref 136–145)

## 2012-06-01 LAB — T4, FREE: Free Thyroxine: 0.91 ng/dL (ref 0.76–1.46)

## 2012-06-01 LAB — CBC WITH DIFFERENTIAL/PLATELET
Bands: 3 %
HGB: 11 g/dL — ABNORMAL LOW (ref 12.0–16.0)
MCH: 29.2 pg (ref 26.0–34.0)
MCV: 90 fL (ref 80–100)
Monocytes: 3 %
NRBC/100 WBC: 1 /
RBC: 3.75 10*6/uL — ABNORMAL LOW (ref 3.80–5.20)
Segmented Neutrophils: 82 %
WBC: 12.3 10*3/uL — ABNORMAL HIGH (ref 3.6–11.0)

## 2012-06-01 LAB — EXPECTORATED SPUTUM ASSESSMENT W GRAM STAIN, RFLX TO RESP C

## 2012-06-02 DIAGNOSIS — Z01810 Encounter for preprocedural cardiovascular examination: Secondary | ICD-10-CM

## 2012-06-02 LAB — SODIUM: Sodium: 135 mmol/L — ABNORMAL LOW (ref 136–145)

## 2012-06-02 LAB — CBC WITH DIFFERENTIAL/PLATELET
Basophil #: 0 10*3/uL (ref 0.0–0.1)
Basophil %: 0.4 %
Eosinophil #: 0 10*3/uL (ref 0.0–0.7)
Lymphocyte #: 2 10*3/uL (ref 1.0–3.6)
MCH: 29 pg (ref 26.0–34.0)
MCV: 89 fL (ref 80–100)
Neutrophil #: 9.1 10*3/uL — ABNORMAL HIGH (ref 1.4–6.5)
Platelet: 300 10*3/uL (ref 150–440)
WBC: 11.9 10*3/uL — ABNORMAL HIGH (ref 3.6–11.0)

## 2012-06-02 LAB — BASIC METABOLIC PANEL
Anion Gap: 7 (ref 7–16)
BUN: 48 mg/dL — ABNORMAL HIGH (ref 7–18)
Calcium, Total: 8.8 mg/dL (ref 8.5–10.1)
Chloride: 96 mmol/L — ABNORMAL LOW (ref 98–107)
Co2: 30 mmol/L (ref 21–32)
EGFR (African American): >60
EGFR (Non-African Amer.): 54 — ABNORMAL LOW
Glucose: 106 mg/dL — ABNORMAL HIGH (ref 65–99)
Osmolality: 279 (ref 275–301)
Potassium: 4.1 mmol/L (ref 3.5–5.1)

## 2012-06-02 LAB — CK TOTAL AND CKMB (NOT AT ARMC)
CK, Total: 32 U/L (ref 21–215)
CK, Total: 45 U/L (ref 21–215)
CK-MB: <0.5 ng/mL — ABNORMAL LOW (ref 0.5–3.6)
CK-MB: <0.5 ng/mL — ABNORMAL LOW (ref 0.5–3.6)

## 2012-06-02 LAB — TROPONIN I
Troponin-I: <0.02 ng/mL
Troponin-I: <0.02 ng/mL

## 2012-06-02 LAB — BUN: BUN: 51 mg/dL — ABNORMAL HIGH (ref 7–18)

## 2012-06-03 LAB — MAGNESIUM: Magnesium: 2.1 mg/dL

## 2012-06-03 LAB — PHOSPHORUS: Phosphorus: 4.4 mg/dL (ref 2.5–4.9)

## 2012-06-04 LAB — CBC WITH DIFFERENTIAL/PLATELET
Basophil #: 0 10*3/uL (ref 0.0–0.1)
Basophil %: 0.1 %
Eosinophil %: 0.7 %
HGB: 10.3 g/dL — ABNORMAL LOW (ref 12.0–16.0)
Monocyte #: 0.7 x10 3/mm (ref 0.2–0.9)
Monocyte %: 5.3 %
Neutrophil #: 10.2 10*3/uL — ABNORMAL HIGH (ref 1.4–6.5)
Neutrophil %: 78.1 %
Platelet: 263 10*3/uL (ref 150–440)
RBC: 3.55 10*6/uL — ABNORMAL LOW (ref 3.80–5.20)

## 2012-06-04 LAB — COMPREHENSIVE METABOLIC PANEL
Albumin: 2.4 g/dL — ABNORMAL LOW (ref 3.4–5.0)
Alkaline Phosphatase: 46 U/L — ABNORMAL LOW (ref 50–136)
Anion Gap: 7 (ref 7–16)
Bilirubin,Total: 0.3 mg/dL (ref 0.2–1.0)
Creatinine: 0.84 mg/dL (ref 0.60–1.30)
EGFR (African American): >60
EGFR (Non-African Amer.): >60
Glucose: 124 mg/dL — ABNORMAL HIGH (ref 65–99)
SGOT(AST): 19 U/L (ref 15–37)
SGPT (ALT): 23 U/L (ref 12–78)
Total Protein: 6.1 g/dL — ABNORMAL LOW (ref 6.4–8.2)

## 2012-06-06 LAB — CBC WITH DIFFERENTIAL/PLATELET
Basophil #: 0 10*3/uL (ref 0.0–0.1)
Basophil %: 0.2 %
Eosinophil #: 0.1 10*3/uL (ref 0.0–0.7)
Eosinophil %: 0.6 %
HCT: 32.5 % — ABNORMAL LOW (ref 35.0–47.0)
HGB: 10.5 g/dL — ABNORMAL LOW (ref 12.0–16.0)
Lymphocyte #: 1.5 10*3/uL (ref 1.0–3.6)
MCH: 29 pg (ref 26.0–34.0)
Monocyte #: 0.7 x10 3/mm (ref 0.2–0.9)
Monocyte %: 7.2 %
Neutrophil #: 7.9 10*3/uL — ABNORMAL HIGH (ref 1.4–6.5)
Neutrophil %: 77.4 %
Platelet: 231 10*3/uL (ref 150–440)
WBC: 10.2 10*3/uL (ref 3.6–11.0)

## 2012-06-06 LAB — PHOSPHORUS: Phosphorus: 2.7 mg/dL (ref 2.5–4.9)

## 2012-06-06 LAB — POTASSIUM: Potassium: 3.6 mmol/L (ref 3.5–5.1)

## 2012-06-06 LAB — OCCULT BLOOD X 1 CARD TO LAB, STOOL: Occult Blood, Feces: NEGATIVE

## 2012-06-06 LAB — BASIC METABOLIC PANEL
Anion Gap: 7 (ref 7–16)
BUN: 23 mg/dL — ABNORMAL HIGH (ref 7–18)
Calcium, Total: 8.5 mg/dL (ref 8.5–10.1)
Chloride: 105 mmol/L (ref 98–107)
Co2: 26 mmol/L (ref 21–32)
EGFR (African American): >60
Potassium: 3.2 mmol/L — ABNORMAL LOW (ref 3.5–5.1)

## 2012-06-06 LAB — MAGNESIUM: Magnesium: 1.6 mg/dL — ABNORMAL LOW

## 2012-06-07 LAB — CBC WITH DIFFERENTIAL/PLATELET
Basophil #: 0.1 10*3/uL (ref 0.0–0.1)
Basophil %: 0.5 %
Eosinophil %: 0.6 %
HCT: 34.7 % — ABNORMAL LOW (ref 35.0–47.0)
HGB: 11.3 g/dL — ABNORMAL LOW (ref 12.0–16.0)
Lymphocyte #: 1.7 10*3/uL (ref 1.0–3.6)
MCHC: 32.4 g/dL (ref 32.0–36.0)
Neutrophil #: 8.8 10*3/uL — ABNORMAL HIGH (ref 1.4–6.5)
Neutrophil %: 76.6 %
RBC: 3.87 10*6/uL (ref 3.80–5.20)
RDW: 15 % — ABNORMAL HIGH (ref 11.5–14.5)
WBC: 11.5 10*3/uL — ABNORMAL HIGH (ref 3.6–11.0)

## 2012-06-07 LAB — SODIUM: Sodium: 139 mmol/L (ref 136–145)

## 2012-06-07 LAB — POTASSIUM: Potassium: 3.3 mmol/L — ABNORMAL LOW (ref 3.5–5.1)

## 2012-06-08 LAB — CBC WITH DIFFERENTIAL/PLATELET
Basophil #: 0 10*3/uL (ref 0.0–0.1)
Basophil %: 0.3 %
Eosinophil #: 0.1 10*3/uL (ref 0.0–0.7)
Eosinophil %: 0.9 %
HCT: 34.6 % — ABNORMAL LOW (ref 35.0–47.0)
HGB: 11.3 g/dL — ABNORMAL LOW (ref 12.0–16.0)
Lymphocyte #: 1.2 10*3/uL (ref 1.0–3.6)
Lymphocyte %: 10.7 %
MCV: 90 fL (ref 80–100)
Monocyte #: 0.8 x10 3/mm (ref 0.2–0.9)
Monocyte %: 7.5 %
Neutrophil #: 8.9 10*3/uL — ABNORMAL HIGH (ref 1.4–6.5)
RBC: 3.84 10*6/uL (ref 3.80–5.20)
RDW: 15.6 % — ABNORMAL HIGH (ref 11.5–14.5)

## 2012-06-08 LAB — BASIC METABOLIC PANEL
Anion Gap: 8 (ref 7–16)
Calcium, Total: 8.9 mg/dL (ref 8.5–10.1)
Chloride: 108 mmol/L — ABNORMAL HIGH (ref 98–107)
Co2: 24 mmol/L (ref 21–32)
Creatinine: 0.63 mg/dL (ref 0.60–1.30)
EGFR (African American): >60
EGFR (Non-African Amer.): >60
Glucose: 134 mg/dL — ABNORMAL HIGH (ref 65–99)
Osmolality: 283 (ref 275–301)

## 2012-06-10 LAB — CALCIUM: Calcium, Total: 8.9 mg/dL (ref 8.5–10.1)

## 2012-06-10 LAB — MAGNESIUM: Magnesium: 1.3 mg/dL — ABNORMAL LOW

## 2012-06-10 LAB — POTASSIUM: Potassium: 3.2 mmol/L — ABNORMAL LOW (ref 3.5–5.1)

## 2012-06-11 ENCOUNTER — Ambulatory Visit: Payer: Self-pay | Admitting: Internal Medicine

## 2012-06-25 ENCOUNTER — Ambulatory Visit: Payer: Self-pay | Admitting: Internal Medicine

## 2012-06-26 DIAGNOSIS — I10 Essential (primary) hypertension: Secondary | ICD-10-CM | POA: Insufficient documentation

## 2012-06-26 DIAGNOSIS — K2970 Gastritis, unspecified, without bleeding: Secondary | ICD-10-CM | POA: Insufficient documentation

## 2012-06-26 DIAGNOSIS — E119 Type 2 diabetes mellitus without complications: Secondary | ICD-10-CM | POA: Insufficient documentation

## 2012-06-26 DIAGNOSIS — G8929 Other chronic pain: Secondary | ICD-10-CM | POA: Insufficient documentation

## 2012-07-13 LAB — CBC CANCER CENTER
Basophil %: 0.6 %
Eosinophil #: 0.2 x10 3/mm (ref 0.0–0.7)
Eosinophil %: 2 %
HCT: 38.5 % (ref 35.0–47.0)
HGB: 12.7 g/dL (ref 12.0–16.0)
Lymphocyte %: 19.4 %
MCH: 29.1 pg (ref 26.0–34.0)
MCHC: 33 g/dL (ref 32.0–36.0)
Monocyte #: 0.5 x10 3/mm (ref 0.2–0.9)
Monocyte %: 5.3 %
Neutrophil #: 6.4 x10 3/mm (ref 1.4–6.5)
Platelet: 264 x10 3/mm (ref 150–440)
RBC: 4.36 10*6/uL (ref 3.80–5.20)

## 2012-07-13 LAB — BASIC METABOLIC PANEL
Anion Gap: 10 (ref 7–16)
BUN: 15 mg/dL (ref 7–18)
Chloride: 103 mmol/L (ref 98–107)
EGFR (African American): >60
Osmolality: 276 (ref 275–301)
Potassium: 3.7 mmol/L (ref 3.5–5.1)

## 2012-07-13 LAB — PHOSPHORUS: Phosphorus: 3.5 mg/dL (ref 2.5–4.9)

## 2012-07-13 LAB — HEPATIC FUNCTION PANEL A (ARMC)
Bilirubin, Direct: <0.1 mg/dL (ref 0.00–0.20)
SGOT(AST): 29 U/L (ref 15–37)
SGPT (ALT): 30 U/L (ref 12–78)
Total Protein: 7.1 g/dL (ref 6.4–8.2)

## 2012-07-26 ENCOUNTER — Ambulatory Visit: Payer: Self-pay | Admitting: Internal Medicine

## 2012-07-30 DIAGNOSIS — J189 Pneumonia, unspecified organism: Secondary | ICD-10-CM | POA: Insufficient documentation

## 2012-07-31 DIAGNOSIS — K5792 Diverticulitis of intestine, part unspecified, without perforation or abscess without bleeding: Secondary | ICD-10-CM | POA: Insufficient documentation

## 2012-07-31 DIAGNOSIS — M797 Fibromyalgia: Secondary | ICD-10-CM | POA: Insufficient documentation

## 2012-08-17 LAB — CBC CANCER CENTER
HCT: 36.6 % (ref 35.0–47.0)
HGB: 12.5 g/dL (ref 12.0–16.0)
Lymphocyte #: 1.2 x10 3/mm (ref 1.0–3.6)
MCHC: 34.2 g/dL (ref 32.0–36.0)
MCV: 88 fL (ref 80–100)
Monocyte #: 0.4 x10 3/mm (ref 0.2–0.9)
Monocyte %: 7.9 %
Neutrophil #: 2.9 x10 3/mm (ref 1.4–6.5)
Neutrophil %: 62.5 %
WBC: 4.7 x10 3/mm (ref 3.6–11.0)

## 2012-08-17 LAB — BASIC METABOLIC PANEL
BUN: 8 mg/dL (ref 7–18)
Calcium, Total: 9.3 mg/dL (ref 8.5–10.1)
Co2: 29 mmol/L (ref 21–32)
Creatinine: 0.81 mg/dL (ref 0.60–1.30)
Glucose: 159 mg/dL — ABNORMAL HIGH (ref 65–99)
Osmolality: 277 (ref 275–301)
Sodium: 138 mmol/L (ref 136–145)

## 2012-08-17 LAB — HEPATIC FUNCTION PANEL A (ARMC)
Albumin: 3.1 g/dL — ABNORMAL LOW (ref 3.4–5.0)
Alkaline Phosphatase: 67 U/L (ref 50–136)
Bilirubin, Direct: <0.1 mg/dL (ref 0.00–0.20)
SGOT(AST): 15 U/L (ref 15–37)
Total Protein: 7.1 g/dL (ref 6.4–8.2)

## 2012-08-17 LAB — MAGNESIUM: Magnesium: 1.1 mg/dL — ABNORMAL LOW

## 2012-08-25 ENCOUNTER — Ambulatory Visit: Payer: Self-pay | Admitting: Internal Medicine

## 2012-09-08 DIAGNOSIS — I10 Essential (primary) hypertension: Secondary | ICD-10-CM | POA: Insufficient documentation

## 2012-09-08 DIAGNOSIS — I5030 Unspecified diastolic (congestive) heart failure: Secondary | ICD-10-CM | POA: Insufficient documentation

## 2012-09-08 DIAGNOSIS — M1990 Unspecified osteoarthritis, unspecified site: Secondary | ICD-10-CM | POA: Insufficient documentation

## 2012-09-14 LAB — CBC CANCER CENTER
Basophil #: 0 x10 3/mm (ref 0.0–0.1)
Eosinophil #: 0.1 x10 3/mm (ref 0.0–0.7)
Eosinophil %: 1.9 %
HCT: 36 % (ref 35.0–47.0)
HGB: 12.4 g/dL (ref 12.0–16.0)
Lymphocyte %: 19.1 %
MCHC: 34.4 g/dL (ref 32.0–36.0)
Monocyte %: 4.8 %
Neutrophil %: 73.7 %
Platelet: 226 x10 3/mm (ref 150–440)
WBC: 7.7 x10 3/mm (ref 3.6–11.0)

## 2012-09-14 LAB — BASIC METABOLIC PANEL
Calcium, Total: 9 mg/dL (ref 8.5–10.1)
Chloride: 100 mmol/L (ref 98–107)
Co2: 28 mmol/L (ref 21–32)
Creatinine: 0.95 mg/dL (ref 0.60–1.30)
EGFR (African American): >60
Sodium: 137 mmol/L (ref 136–145)

## 2012-09-14 LAB — HEPATIC FUNCTION PANEL A (ARMC)
Albumin: 3 g/dL — ABNORMAL LOW (ref 3.4–5.0)
Bilirubin, Direct: 0.1 mg/dL (ref 0.00–0.20)
SGPT (ALT): 25 U/L (ref 12–78)
Total Protein: 6.8 g/dL (ref 6.4–8.2)

## 2012-09-14 LAB — MAGNESIUM: Magnesium: 1.6 mg/dL — ABNORMAL LOW

## 2012-09-24 DIAGNOSIS — R519 Headache, unspecified: Secondary | ICD-10-CM | POA: Insufficient documentation

## 2012-09-25 ENCOUNTER — Ambulatory Visit: Payer: Self-pay | Admitting: Internal Medicine

## 2012-10-14 LAB — MAGNESIUM: Magnesium: 1.6 mg/dL — ABNORMAL LOW

## 2012-10-14 LAB — CBC CANCER CENTER
Basophil #: 0 x10 3/mm (ref 0.0–0.1)
Basophil %: 0.2 %
Eosinophil #: 0.2 x10 3/mm (ref 0.0–0.7)
Eosinophil %: 2.7 %
HCT: 35.8 % (ref 35.0–47.0)
HGB: 12.2 g/dL (ref 12.0–16.0)
Lymphocyte #: 1.7 x10 3/mm (ref 1.0–3.6)
MCH: 29.9 pg (ref 26.0–34.0)
Monocyte %: 6 %
Neutrophil #: 4.5 x10 3/mm (ref 1.4–6.5)
Neutrophil %: 66.1 %
RDW: 13.4 % (ref 11.5–14.5)

## 2012-10-14 LAB — HEPATIC FUNCTION PANEL A (ARMC)
Albumin: 3 g/dL — ABNORMAL LOW
Alkaline Phosphatase: 79 U/L
Bilirubin, Direct: 0.1 mg/dL
Bilirubin,Total: 0.2 mg/dL
SGOT(AST): 21 U/L
SGPT (ALT): 19 U/L
Total Protein: 6.7 g/dL

## 2012-10-14 LAB — BASIC METABOLIC PANEL
Anion Gap: 7 (ref 7–16)
Calcium, Total: 9 mg/dL (ref 8.5–10.1)
Creatinine: 1.02 mg/dL (ref 0.60–1.30)
EGFR (Non-African Amer.): >60
Osmolality: 276 (ref 275–301)
Potassium: 3.8 mmol/L (ref 3.5–5.1)
Sodium: 138 mmol/L (ref 136–145)

## 2012-10-14 LAB — PHOSPHORUS: Phosphorus: 2.3 mg/dL — ABNORMAL LOW (ref 2.5–4.9)

## 2012-10-26 ENCOUNTER — Ambulatory Visit: Payer: Self-pay | Admitting: Internal Medicine

## 2012-11-23 LAB — CBC CANCER CENTER
Basophil %: 0.2 %
Eosinophil #: 0.1 x10 3/mm (ref 0.0–0.7)
Eosinophil %: 1.7 %
HCT: 38.1 % (ref 35.0–47.0)
HGB: 12.6 g/dL (ref 12.0–16.0)
Lymphocyte %: 19.3 %
MCH: 28.9 pg (ref 26.0–34.0)
MCHC: 33.2 g/dL (ref 32.0–36.0)
MCV: 87 fL (ref 80–100)
Monocyte #: 0.5 x10 3/mm (ref 0.2–0.9)
Monocyte %: 5.7 %
Neutrophil #: 5.9 x10 3/mm (ref 1.4–6.5)
Neutrophil %: 73.1 %
Platelet: 255 x10 3/mm (ref 150–440)
RBC: 4.37 10*6/uL (ref 3.80–5.20)
RDW: 13.2 % (ref 11.5–14.5)
WBC: 8 x10 3/mm (ref 3.6–11.0)

## 2012-11-23 LAB — BASIC METABOLIC PANEL
Anion Gap: 8 (ref 7–16)
BUN: 15 mg/dL (ref 7–18)
Calcium, Total: 9.1 mg/dL (ref 8.5–10.1)
Creatinine: 0.94 mg/dL (ref 0.60–1.30)
Sodium: 139 mmol/L (ref 136–145)

## 2012-11-23 LAB — HEPATIC FUNCTION PANEL A (ARMC)
Bilirubin,Total: 0.2 mg/dL (ref 0.2–1.0)
Total Protein: 6.8 g/dL (ref 6.4–8.2)

## 2012-11-23 LAB — PHOSPHORUS: Phosphorus: 2.5 mg/dL (ref 2.5–4.9)

## 2012-11-25 ENCOUNTER — Ambulatory Visit: Payer: Self-pay | Admitting: Internal Medicine

## 2012-12-31 ENCOUNTER — Ambulatory Visit: Payer: Self-pay | Admitting: Internal Medicine

## 2012-12-31 LAB — HEPATIC FUNCTION PANEL A (ARMC)
Albumin: 2.7 g/dL — ABNORMAL LOW (ref 3.4–5.0)
Bilirubin, Direct: 0.1 mg/dL (ref 0.00–0.20)
Bilirubin,Total: 0.2 mg/dL (ref 0.2–1.0)
SGOT(AST): 17 U/L (ref 15–37)
Total Protein: 6.3 g/dL — ABNORMAL LOW (ref 6.4–8.2)

## 2012-12-31 LAB — BASIC METABOLIC PANEL
Anion Gap: 7 (ref 7–16)
Calcium, Total: 8.6 mg/dL (ref 8.5–10.1)
Co2: 31 mmol/L (ref 21–32)
Creatinine: 1.04 mg/dL (ref 0.60–1.30)
EGFR (African American): >60
EGFR (Non-African Amer.): 60 — ABNORMAL LOW
Glucose: 141 mg/dL — ABNORMAL HIGH (ref 65–99)
Osmolality: 285 (ref 275–301)
Sodium: 141 mmol/L (ref 136–145)

## 2012-12-31 LAB — CBC CANCER CENTER
Eosinophil #: 0.1 x10 3/mm (ref 0.0–0.7)
Lymphocyte %: 21.3 %
MCHC: 32.3 g/dL (ref 32.0–36.0)
MCV: 89 fL (ref 80–100)
Monocyte #: 0.4 x10 3/mm (ref 0.2–0.9)
Monocyte %: 5.8 %
Neutrophil %: 70.8 %
RBC: 4.17 10*6/uL (ref 3.80–5.20)
RDW: 14.3 % (ref 11.5–14.5)
WBC: 6.5 x10 3/mm (ref 3.6–11.0)

## 2012-12-31 LAB — MAGNESIUM: Magnesium: 1.4 mg/dL — ABNORMAL LOW

## 2012-12-31 LAB — PHOSPHORUS: Phosphorus: 3.2 mg/dL (ref 2.5–4.9)

## 2013-01-21 ENCOUNTER — Emergency Department: Payer: Self-pay | Admitting: Emergency Medicine

## 2013-01-25 ENCOUNTER — Ambulatory Visit: Payer: Self-pay | Admitting: Internal Medicine

## 2013-01-29 ENCOUNTER — Ambulatory Visit (INDEPENDENT_AMBULATORY_CARE_PROVIDER_SITE_OTHER): Payer: Medicaid Other | Admitting: Podiatry

## 2013-01-29 ENCOUNTER — Encounter: Payer: Self-pay | Admitting: Podiatry

## 2013-01-29 ENCOUNTER — Ambulatory Visit (INDEPENDENT_AMBULATORY_CARE_PROVIDER_SITE_OTHER): Payer: Medicaid Other

## 2013-01-29 VITALS — BP 155/87 | HR 102 | Resp 20 | Ht 67.0 in | Wt 300.0 lb

## 2013-01-29 DIAGNOSIS — S93409A Sprain of unspecified ligament of unspecified ankle, initial encounter: Secondary | ICD-10-CM

## 2013-01-29 DIAGNOSIS — R609 Edema, unspecified: Secondary | ICD-10-CM

## 2013-01-29 DIAGNOSIS — S93499A Sprain of other ligament of unspecified ankle, initial encounter: Secondary | ICD-10-CM

## 2013-01-29 DIAGNOSIS — M79671 Pain in right foot: Secondary | ICD-10-CM

## 2013-01-29 NOTE — Progress Notes (Signed)
   Subjective:    Patient ID: Tara Levine, female    DOB: 08-08-1955, 57 y.o.   MRN: [id number]  HPI Comments: N pain L right foot-dorsal /lateral D11.27.14 O sudden C worse A walking,standing T went to er- x-rays, removable splint, Endocet,   Foot Pain Associated symptoms include abdominal pain, chills, headaches, nausea, numbness, vomiting and weakness.      Review of Systems  Constitutional: Positive for chills and appetite change.       Weight changes sweating  HENT: Positive for sneezing and trouble swallowing.        Sinus problems Sore throat Ringing in ears  Eyes: Positive for itching and visual disturbance.  Respiratory: Positive for chest tightness, shortness of breath and wheezing.        Difficulty breathing  Cardiovascular:       Calf pain when walking  Gastrointestinal: Positive for nausea, vomiting and abdominal pain.  Endocrine:       Excessive thirst  Genitourinary: Negative.   Musculoskeletal: Positive for back pain.       Joint pain Back pain Difficulty walking Muscle pain  Skin: Negative.   Allergic/Immunologic: Positive for environmental allergies.  Neurological: Positive for dizziness, weakness, numbness and headaches.  Hematological: Bruises/bleeds easily.  Psychiatric/Behavioral: The patient is nervous/anxious.        Objective:   Physical Exam        Assessment & Plan:

## 2013-01-30 NOTE — Progress Notes (Signed)
Subjective:     Patient ID: Tara Levine, female   DOB: 1955-11-07, 57 y.o.   MRN: [id number]  Foot Pain   patient presents stating she twisted her right ankle on Thanksgiving Day and it is still swollen on the outside and painful and she wanted to see if there was anything that could help her. Went to the emergency room and was negative for fracture   Review of Systems  All other systems reviewed and are negative.       Objective:   Physical Exam  Nursing note and vitals reviewed. Constitutional: She is oriented to person, place, and time.  Cardiovascular: Intact distal pulses.   Musculoskeletal: Normal range of motion.  Neurological: She is oriented to person, place, and time.  Skin: Skin is warm.   neurovascular status intact and edema in the entire right ankle with pain along the anterior talofibular ligament and the sinus tarsi. Patient does have some limitation of motion but appears to be splinting due to pain. Quite a bit of edema which is normal for 1 week after injury    Assessment:     Severe sprain right ankle with pain in the anterior talofibular ligament    Plan:     H&P and x-ray reviewed and today I applied Unna boot Ace wrap to reduce swelling and instructed on continuing with bracing and the told recovery period from this will probably be 8-12 weeks. If symptoms persist patient is to come back for further evaluation in the next 2-3 weeks

## 2013-02-24 LAB — HEPATIC FUNCTION PANEL A (ARMC)
Albumin: 3.2 g/dL — ABNORMAL LOW (ref 3.4–5.0)
Alkaline Phosphatase: 73 U/L
Bilirubin, Direct: 0.1 mg/dL (ref 0.00–0.20)
Bilirubin,Total: 0.3 mg/dL (ref 0.2–1.0)
SGOT(AST): 87 U/L — ABNORMAL HIGH (ref 15–37)
SGPT (ALT): 223 U/L — ABNORMAL HIGH (ref 12–78)

## 2013-02-24 LAB — BASIC METABOLIC PANEL
Calcium, Total: 9.1 mg/dL (ref 8.5–10.1)
Chloride: 102 mmol/L (ref 98–107)
Creatinine: 1.14 mg/dL (ref 0.60–1.30)
EGFR (African American): >60
EGFR (Non-African Amer.): 53 — ABNORMAL LOW
Osmolality: 284 (ref 275–301)
Sodium: 139 mmol/L (ref 136–145)

## 2013-02-24 LAB — CBC CANCER CENTER
Basophil %: 0.4 %
Eosinophil %: 2.3 %
HCT: 36 % (ref 35.0–47.0)
Lymphocyte #: 1.2 x10 3/mm (ref 1.0–3.6)
Lymphocyte %: 15.6 %
MCH: 29.4 pg (ref 26.0–34.0)
MCHC: 33 g/dL (ref 32.0–36.0)
Monocyte #: 0.5 x10 3/mm (ref 0.2–0.9)
Neutrophil %: 74.6 %
Platelet: 245 x10 3/mm (ref 150–440)
RBC: 4.04 10*6/uL (ref 3.80–5.20)
WBC: 7.6 x10 3/mm (ref 3.6–11.0)

## 2013-02-24 LAB — PHOSPHORUS: Phosphorus: 3.2 mg/dL (ref 2.5–4.9)

## 2013-02-25 ENCOUNTER — Ambulatory Visit: Payer: Self-pay | Admitting: Internal Medicine

## 2013-03-25 DIAGNOSIS — I4581 Long QT syndrome: Secondary | ICD-10-CM

## 2013-03-25 LAB — CBC CANCER CENTER
BASOS ABS: 0 x10 3/mm (ref 0.0–0.1)
BASOS PCT: 0.3 %
Eosinophil #: 0.1 x10 3/mm (ref 0.0–0.7)
Eosinophil %: 2.1 %
HCT: 37 % (ref 35.0–47.0)
HGB: 12.4 g/dL (ref 12.0–16.0)
LYMPHS ABS: 1.1 x10 3/mm (ref 1.0–3.6)
LYMPHS PCT: 17.8 %
MCH: 29.8 pg (ref 26.0–34.0)
MCHC: 33.5 g/dL (ref 32.0–36.0)
MCV: 89 fL (ref 80–100)
Monocyte #: 0.5 x10 3/mm (ref 0.2–0.9)
Monocyte %: 8.1 %
NEUTROS PCT: 71.7 %
Neutrophil #: 4.3 x10 3/mm (ref 1.4–6.5)
Platelet: 210 x10 3/mm (ref 150–440)
RBC: 4.16 10*6/uL (ref 3.80–5.20)
RDW: 13.8 % (ref 11.5–14.5)
WBC: 6 x10 3/mm (ref 3.6–11.0)

## 2013-03-25 LAB — HEPATIC FUNCTION PANEL A (ARMC)
ALT: 44 U/L (ref 12–78)
Albumin: 3.1 g/dL — ABNORMAL LOW (ref 3.4–5.0)
Alkaline Phosphatase: 77 U/L
BILIRUBIN DIRECT: 0.2 mg/dL (ref 0.00–0.20)
Bilirubin,Total: 0.6 mg/dL (ref 0.2–1.0)
SGOT(AST): 24 U/L (ref 15–37)
Total Protein: 7.1 g/dL (ref 6.4–8.2)

## 2013-03-25 LAB — BASIC METABOLIC PANEL
Anion Gap: 8 (ref 7–16)
BUN: 27 mg/dL — AB (ref 7–18)
Calcium, Total: 8.9 mg/dL (ref 8.5–10.1)
Chloride: 98 mmol/L (ref 98–107)
Co2: 28 mmol/L (ref 21–32)
Creatinine: 1.08 mg/dL (ref 0.60–1.30)
EGFR (African American): >60
GFR CALC NON AF AMER: 57 — AB
GLUCOSE: 199 mg/dL — AB (ref 65–99)
Osmolality: 279 (ref 275–301)
Potassium: 4 mmol/L (ref 3.5–5.1)
Sodium: 134 mmol/L — ABNORMAL LOW (ref 136–145)

## 2013-03-25 LAB — PHOSPHORUS: PHOSPHORUS: 2.8 mg/dL (ref 2.5–4.9)

## 2013-03-25 LAB — MAGNESIUM: Magnesium: 1.6 mg/dL — ABNORMAL LOW

## 2013-03-28 ENCOUNTER — Ambulatory Visit: Payer: Self-pay | Admitting: Internal Medicine

## 2013-05-12 ENCOUNTER — Ambulatory Visit: Payer: Self-pay | Admitting: Internal Medicine

## 2013-05-20 LAB — BASIC METABOLIC PANEL
Anion Gap: 8 (ref 7–16)
BUN: 30 mg/dL — ABNORMAL HIGH (ref 7–18)
CHLORIDE: 101 mmol/L (ref 98–107)
Calcium, Total: 8.8 mg/dL (ref 8.5–10.1)
Co2: 29 mmol/L (ref 21–32)
Creatinine: 1.31 mg/dL — ABNORMAL HIGH (ref 0.60–1.30)
EGFR (African American): 52 — ABNORMAL LOW
EGFR (Non-African Amer.): 45 — ABNORMAL LOW
GLUCOSE: 136 mg/dL — AB (ref 65–99)
Osmolality: 284 (ref 275–301)
Potassium: 4.4 mmol/L (ref 3.5–5.1)
Sodium: 138 mmol/L (ref 136–145)

## 2013-05-20 LAB — CBC CANCER CENTER
Basophil #: 0 x10 3/mm (ref 0.0–0.1)
Basophil %: 0.2 %
Eosinophil #: 0.1 x10 3/mm (ref 0.0–0.7)
Eosinophil %: 1.9 %
HCT: 41 % (ref 35.0–47.0)
HGB: 13.5 g/dL (ref 12.0–16.0)
Lymphocyte #: 1 x10 3/mm (ref 1.0–3.6)
Lymphocyte %: 15.8 %
MCH: 29.3 pg (ref 26.0–34.0)
MCHC: 32.9 g/dL (ref 32.0–36.0)
MCV: 89 fL (ref 80–100)
MONO ABS: 0.6 x10 3/mm (ref 0.2–0.9)
Monocyte %: 9.1 %
Neutrophil #: 4.5 x10 3/mm (ref 1.4–6.5)
Neutrophil %: 73 %
Platelet: 238 x10 3/mm (ref 150–440)
RBC: 4.61 10*6/uL (ref 3.80–5.20)
RDW: 14 % (ref 11.5–14.5)
WBC: 6.1 x10 3/mm (ref 3.6–11.0)

## 2013-05-20 LAB — HEPATIC FUNCTION PANEL A (ARMC)
ALBUMIN: 3.3 g/dL — AB (ref 3.4–5.0)
ALK PHOS: 61 U/L
ALT: 209 U/L — AB (ref 12–78)
BILIRUBIN DIRECT: 0.1 mg/dL (ref 0.00–0.20)
BILIRUBIN TOTAL: 0.3 mg/dL (ref 0.2–1.0)
SGOT(AST): 102 U/L — ABNORMAL HIGH (ref 15–37)
TOTAL PROTEIN: 7 g/dL (ref 6.4–8.2)

## 2013-05-20 LAB — MAGNESIUM: Magnesium: 1.9 mg/dL

## 2013-05-20 LAB — PHOSPHORUS: Phosphorus: 2.7 mg/dL (ref 2.5–4.9)

## 2013-05-26 ENCOUNTER — Ambulatory Visit: Payer: Self-pay | Admitting: Internal Medicine

## 2013-06-08 LAB — HEPATIC FUNCTION PANEL A (ARMC)
Albumin: 3.5 g/dL (ref 3.4–5.0)
Alkaline Phosphatase: 63 U/L
Bilirubin, Direct: 0.1 mg/dL (ref 0.00–0.20)
Bilirubin,Total: 0.3 mg/dL (ref 0.2–1.0)
SGOT(AST): 117 U/L — ABNORMAL HIGH (ref 15–37)
SGPT (ALT): 255 U/L — ABNORMAL HIGH (ref 12–78)
Total Protein: 7.5 g/dL (ref 6.4–8.2)

## 2013-06-25 ENCOUNTER — Ambulatory Visit: Payer: Self-pay | Admitting: Internal Medicine

## 2013-07-15 ENCOUNTER — Ambulatory Visit: Payer: Self-pay | Admitting: Internal Medicine

## 2013-07-21 LAB — HEPATIC FUNCTION PANEL A (ARMC)
Albumin: 3.5 g/dL (ref 3.4–5.0)
Alkaline Phosphatase: 62 U/L
Bilirubin, Direct: <0.1 mg/dL (ref 0.00–0.20)
Bilirubin,Total: 0.4 mg/dL (ref 0.2–1.0)
SGOT(AST): 129 U/L — ABNORMAL HIGH (ref 15–37)
SGPT (ALT): 261 U/L — ABNORMAL HIGH (ref 12–78)
Total Protein: 7.3 g/dL (ref 6.4–8.2)

## 2013-07-21 LAB — CBC CANCER CENTER
BASOS ABS: 0 x10 3/mm (ref 0.0–0.1)
BASOS PCT: 0.2 %
EOS ABS: 0.1 x10 3/mm (ref 0.0–0.7)
Eosinophil %: 1.7 %
HCT: 41.3 % (ref 35.0–47.0)
HGB: 13.6 g/dL (ref 12.0–16.0)
LYMPHS ABS: 1.4 x10 3/mm (ref 1.0–3.6)
LYMPHS PCT: 21.7 %
MCH: 28.9 pg (ref 26.0–34.0)
MCHC: 32.8 g/dL (ref 32.0–36.0)
MCV: 88 fL (ref 80–100)
Monocyte #: 0.5 x10 3/mm (ref 0.2–0.9)
Monocyte %: 7.5 %
NEUTROS ABS: 4.6 x10 3/mm (ref 1.4–6.5)
Neutrophil %: 68.9 %
Platelet: 217 x10 3/mm (ref 150–440)
RBC: 4.69 10*6/uL (ref 3.80–5.20)
RDW: 13.7 % (ref 11.5–14.5)
WBC: 6.6 x10 3/mm (ref 3.6–11.0)

## 2013-07-21 LAB — PHOSPHORUS: PHOSPHORUS: 2.8 mg/dL (ref 2.5–4.9)

## 2013-07-21 LAB — MAGNESIUM: Magnesium: 1.7 mg/dL — ABNORMAL LOW

## 2013-07-21 LAB — BASIC METABOLIC PANEL
Anion Gap: 9 (ref 7–16)
BUN: 28 mg/dL — ABNORMAL HIGH (ref 7–18)
Calcium, Total: 9.4 mg/dL (ref 8.5–10.1)
Chloride: 100 mmol/L (ref 98–107)
Co2: 28 mmol/L (ref 21–32)
Creatinine: 0.89 mg/dL (ref 0.60–1.30)
Glucose: 116 mg/dL — ABNORMAL HIGH (ref 65–99)
OSMOLALITY: 280 (ref 275–301)
POTASSIUM: 3.7 mmol/L (ref 3.5–5.1)
Sodium: 137 mmol/L (ref 136–145)

## 2013-07-26 ENCOUNTER — Ambulatory Visit: Payer: Self-pay | Admitting: Internal Medicine

## 2013-08-13 ENCOUNTER — Ambulatory Visit: Payer: Self-pay | Admitting: Internal Medicine

## 2013-09-17 ENCOUNTER — Ambulatory Visit: Payer: Self-pay | Admitting: Internal Medicine

## 2013-09-17 LAB — BASIC METABOLIC PANEL
ANION GAP: 9 (ref 7–16)
BUN: 30 mg/dL — AB (ref 7–18)
Calcium, Total: 9.5 mg/dL (ref 8.5–10.1)
Chloride: 101 mmol/L (ref 98–107)
Co2: 28 mmol/L (ref 21–32)
Creatinine: 1.28 mg/dL (ref 0.60–1.30)
GFR CALC AF AMER: 53 — AB
GFR CALC NON AF AMER: 46 — AB
GLUCOSE: 115 mg/dL — AB (ref 65–99)
Osmolality: 283 (ref 275–301)
Potassium: 4.1 mmol/L (ref 3.5–5.1)
SODIUM: 138 mmol/L (ref 136–145)

## 2013-09-17 LAB — CBC CANCER CENTER
Basophil #: 0 x10 3/mm (ref 0.0–0.1)
Basophil %: 0.4 %
EOS PCT: 2.3 %
Eosinophil #: 0.2 x10 3/mm (ref 0.0–0.7)
HCT: 40.4 % (ref 35.0–47.0)
HGB: 13.3 g/dL (ref 12.0–16.0)
LYMPHS ABS: 1.8 x10 3/mm (ref 1.0–3.6)
LYMPHS PCT: 26.6 %
MCH: 28.6 pg (ref 26.0–34.0)
MCHC: 32.8 g/dL (ref 32.0–36.0)
MCV: 87 fL (ref 80–100)
Monocyte #: 0.6 x10 3/mm (ref 0.2–0.9)
Monocyte %: 9.1 %
Neutrophil #: 4.3 x10 3/mm (ref 1.4–6.5)
Neutrophil %: 61.6 %
PLATELETS: 211 x10 3/mm (ref 150–440)
RBC: 4.63 10*6/uL (ref 3.80–5.20)
RDW: 13.6 % (ref 11.5–14.5)
WBC: 6.9 x10 3/mm (ref 3.6–11.0)

## 2013-09-17 LAB — HEPATIC FUNCTION PANEL A (ARMC)
ALBUMIN: 3.2 g/dL — AB (ref 3.4–5.0)
ALK PHOS: 78 U/L
Bilirubin, Direct: 0.2 mg/dL (ref 0.00–0.20)
Bilirubin,Total: 0.5 mg/dL (ref 0.2–1.0)
SGOT(AST): 179 U/L — ABNORMAL HIGH (ref 15–37)
SGPT (ALT): 305 U/L — ABNORMAL HIGH
Total Protein: 7.1 g/dL (ref 6.4–8.2)

## 2013-09-17 LAB — PHOSPHORUS: Phosphorus: 2.7 mg/dL (ref 2.5–4.9)

## 2013-09-17 LAB — MAGNESIUM: Magnesium: 1.8 mg/dL

## 2013-09-25 ENCOUNTER — Ambulatory Visit: Payer: Self-pay | Admitting: Internal Medicine

## 2013-10-19 DIAGNOSIS — R042 Hemoptysis: Secondary | ICD-10-CM | POA: Insufficient documentation

## 2013-11-11 ENCOUNTER — Ambulatory Visit: Payer: Self-pay | Admitting: Internal Medicine

## 2013-11-11 LAB — HEPATIC FUNCTION PANEL A (ARMC)
ALK PHOS: 64 U/L
Albumin: 2.8 g/dL — ABNORMAL LOW (ref 3.4–5.0)
BILIRUBIN DIRECT: 0.1 mg/dL (ref 0.00–0.20)
Bilirubin,Total: 0.4 mg/dL (ref 0.2–1.0)
SGOT(AST): 19 U/L (ref 15–37)
SGPT (ALT): 21 U/L
Total Protein: 7 g/dL (ref 6.4–8.2)

## 2013-11-11 LAB — CBC CANCER CENTER
BASOS PCT: 0.2 %
Basophil #: 0 x10 3/mm (ref 0.0–0.1)
EOS ABS: 0.3 x10 3/mm (ref 0.0–0.7)
Eosinophil %: 3.5 %
HCT: 37.2 % (ref 35.0–47.0)
HGB: 12.3 g/dL (ref 12.0–16.0)
Lymphocyte #: 2.1 x10 3/mm (ref 1.0–3.6)
Lymphocyte %: 25.4 %
MCH: 29.2 pg (ref 26.0–34.0)
MCHC: 33.1 g/dL (ref 32.0–36.0)
MCV: 88 fL (ref 80–100)
Monocyte #: 0.5 x10 3/mm (ref 0.2–0.9)
Monocyte %: 5.5 %
Neutrophil #: 5.4 x10 3/mm (ref 1.4–6.5)
Neutrophil %: 65.4 %
Platelet: 236 x10 3/mm (ref 150–440)
RBC: 4.21 10*6/uL (ref 3.80–5.20)
RDW: 14.3 % (ref 11.5–14.5)
WBC: 8.2 x10 3/mm (ref 3.6–11.0)

## 2013-11-11 LAB — BASIC METABOLIC PANEL
ANION GAP: 7 (ref 7–16)
BUN: 19 mg/dL — AB (ref 7–18)
CALCIUM: 8.7 mg/dL (ref 8.5–10.1)
CREATININE: 0.93 mg/dL (ref 0.60–1.30)
Chloride: 99 mmol/L (ref 98–107)
Co2: 29 mmol/L (ref 21–32)
EGFR (African American): >60
EGFR (Non-African Amer.): >60
Glucose: 102 mg/dL — ABNORMAL HIGH (ref 65–99)
Osmolality: 273 (ref 275–301)
POTASSIUM: 3.9 mmol/L (ref 3.5–5.1)
SODIUM: 135 mmol/L — AB (ref 136–145)

## 2013-11-11 LAB — MAGNESIUM: MAGNESIUM: 1.7 mg/dL — AB

## 2013-11-11 LAB — PHOSPHORUS: PHOSPHORUS: 2.5 mg/dL (ref 2.5–4.9)

## 2013-11-19 DIAGNOSIS — F32A Depression, unspecified: Secondary | ICD-10-CM | POA: Insufficient documentation

## 2013-11-19 DIAGNOSIS — F329 Major depressive disorder, single episode, unspecified: Secondary | ICD-10-CM | POA: Insufficient documentation

## 2013-11-25 ENCOUNTER — Ambulatory Visit: Payer: Self-pay | Admitting: Internal Medicine

## 2013-12-09 LAB — CBC CANCER CENTER
BASOS PCT: 0.3 %
Basophil #: 0 x10 3/mm (ref 0.0–0.1)
EOS PCT: 1.8 %
Eosinophil #: 0.1 x10 3/mm (ref 0.0–0.7)
HCT: 39.3 % (ref 35.0–47.0)
HGB: 13.1 g/dL (ref 12.0–16.0)
LYMPHS ABS: 2 x10 3/mm (ref 1.0–3.6)
Lymphocyte %: 25.6 %
MCH: 29.6 pg (ref 26.0–34.0)
MCHC: 33.2 g/dL (ref 32.0–36.0)
MCV: 89 fL (ref 80–100)
MONO ABS: 0.5 x10 3/mm (ref 0.2–0.9)
Monocyte %: 6.3 %
Neutrophil #: 5.2 x10 3/mm (ref 1.4–6.5)
Neutrophil %: 66 %
PLATELETS: 253 x10 3/mm (ref 150–440)
RBC: 4.41 10*6/uL (ref 3.80–5.20)
RDW: 14.1 % (ref 11.5–14.5)
WBC: 7.8 x10 3/mm (ref 3.6–11.0)

## 2013-12-09 LAB — MAGNESIUM: Magnesium: 1.8 mg/dL

## 2013-12-09 LAB — BASIC METABOLIC PANEL
ANION GAP: 5 — AB (ref 7–16)
BUN: 26 mg/dL — ABNORMAL HIGH (ref 7–18)
CALCIUM: 9.6 mg/dL (ref 8.5–10.1)
CREATININE: 1.08 mg/dL (ref 0.60–1.30)
Chloride: 100 mmol/L (ref 98–107)
Co2: 33 mmol/L — ABNORMAL HIGH (ref 21–32)
EGFR (African American): >60
GFR CALC NON AF AMER: 55 — AB
Glucose: 112 mg/dL — ABNORMAL HIGH (ref 65–99)
Osmolality: 281 (ref 275–301)
Potassium: 4.6 mmol/L (ref 3.5–5.1)
SODIUM: 138 mmol/L (ref 136–145)

## 2013-12-09 LAB — HEPATIC FUNCTION PANEL A (ARMC)
ALK PHOS: 66 U/L
AST: 22 U/L (ref 15–37)
Albumin: 3 g/dL — ABNORMAL LOW (ref 3.4–5.0)
BILIRUBIN DIRECT: 0.1 mg/dL (ref 0.00–0.20)
Bilirubin,Total: 0.3 mg/dL (ref 0.2–1.0)
SGPT (ALT): 26 U/L
TOTAL PROTEIN: 7.1 g/dL (ref 6.4–8.2)

## 2013-12-09 LAB — PHOSPHORUS: PHOSPHORUS: 3.3 mg/dL (ref 2.5–4.9)

## 2013-12-26 ENCOUNTER — Ambulatory Visit: Payer: Self-pay | Admitting: Internal Medicine

## 2014-01-17 ENCOUNTER — Institutional Professional Consult (permissible substitution): Payer: Medicaid Other | Admitting: Internal Medicine

## 2014-01-31 ENCOUNTER — Ambulatory Visit (INDEPENDENT_AMBULATORY_CARE_PROVIDER_SITE_OTHER): Payer: Medicaid Other | Admitting: Internal Medicine

## 2014-01-31 ENCOUNTER — Encounter: Payer: Self-pay | Admitting: Internal Medicine

## 2014-01-31 VITALS — BP 128/78 | HR 84 | Ht 67.0 in | Wt 300.0 lb

## 2014-01-31 DIAGNOSIS — R0602 Shortness of breath: Secondary | ICD-10-CM

## 2014-01-31 DIAGNOSIS — R05 Cough: Secondary | ICD-10-CM

## 2014-01-31 DIAGNOSIS — R0683 Snoring: Secondary | ICD-10-CM

## 2014-01-31 DIAGNOSIS — R059 Cough, unspecified: Secondary | ICD-10-CM

## 2014-01-31 DIAGNOSIS — R0982 Postnasal drip: Secondary | ICD-10-CM

## 2014-01-31 NOTE — Patient Instructions (Signed)
A split night sleep study will be scheduled for you at [facility]. Referral to ENT specialist will also be made. Full Pulmonary Function Test will be performed. Follow up with Dr. Stevenson Clinch in 1 month.

## 2014-01-31 NOTE — Progress Notes (Signed)
Date: 01/31/2014  MRN# [id number] Tara Levine [birth year]  Referring Physician - Dr. Darrol Angel Gehling is a 58 y.o. old female seen in consultation for chronic cough  CC: "cough since my hospitalization" Chief Complaint  Patient presents with  . Advice Only    Patient referred by Dr. Doree Fudge for cough. She had a procedure done and had to be intubated and has had cough since.    HPI:  She is a pleasant 58 year old female presenting today with above chief complaint. Patient has a PMHx of HTN, DM, CML, obesity. Patient states that she has seen many physicians for this cough since her intubation in 2014 (diverticulitis and aspiration pneumonitis).  Currently follows up with Dr. Orvil Feil for allergies (cannot recall her meds) Cough since intubation for aspiration (04/2012) - was intubated for approximately 13 days Cough daily, has spells (1-2 spells per day), no pattern, coughing spells usually last 1-6mins, gets light-headed, uses inhaler (albuterol and dulera) for some relief. Productive - greenish sputum at times Previously seeing Dr. Humphrey Rolls and was receiving allergy shots, however transitioned care to Dr. Orvil Feil for allergies.   STOP BANG Snores, tired, apnea, HTN, Obesity, age, inc Delma Post  A brief history from chart review is as follows: Hospitalized at [facility] 05/14/2012 to  06/10/2012 HPI:  Multiple complaints including chills, fever, muscles spasms, nausea, abdominal pain, vomiting, admission on 05/22/2012.   The patient presented with a history of morbid obesity, hypertension, hyperlipidemia, type 2 diabetes, chroniclymphocytic leukemia who presented recently to see to see Dr. Vira Agar where she had an EGD done and a colonoscopy done on February of last year.  The patient developed significant diverticulosis and gastritis and overall at admission date she presented with complaints that were mostly related to gastrointestinal disease.  She had a CT scan of the abdomen that showed evidence of wall  thickening and inflammatory changes of the sigmoid colon consistent with acute diverticulitis. On 05/24/12 found to lethargic, and in respiratory distress, intubated, large vomitus material from ETT noted, short after intubation bradycaria, asystole cardiac, ACLS for 3-4 mins. During hospitalization had diverticulitis and ileus. Extubated on 06/05/12 Discharged on 06/10/12     PMHX:   Past Medical History  Diagnosis Date  . Unspecified essential hypertension   . HLD (hyperlipidemia)   . DM type 2 (diabetes mellitus, type 2)   . Environmental allergies   . Osteoarthritis   . IDA (iron deficiency anemia)     chronic  . History of gastric ulcer   . Panic disorder   . PSS (progressive systemic sclerosis)   . CML (chronic myelocytic leukemia)    Surgical Hx:  Past Surgical History  Procedure Laterality Date  . Back and neck surgery  1999, 2009    had rods placed  . Partial hysterectomy      age 59, no cancer  . Carpal tunnel release     Family Hx:  Family History  Problem Relation Age of Onset  . Diabetes    . Hypertension    . Heart disease    . Prostate cancer     Social Hx:   History  Substance Use Topics  . Smoking status: Former Research scientist (life sciences)  . Smokeless tobacco: Never Used     Comment: quit 1985  . Alcohol Use: No   Medication:   Current Outpatient Rx  Name  Route  Sig  Dispense  Refill  . albuterol (PROAIR HFA) 108 (90 BASE) MCG/ACT inhaler   Inhalation   Inhale 90 mcg into  the lungs as needed.         Marland Kitchen albuterol (PROVENTIL) (2.5 MG/3ML) 0.083% nebulizer solution   Nebulization   Take 2.5 mg by nebulization as needed.         . ALPRAZolam (XANAX) 0.5 MG tablet   Oral   Take 1 mg by mouth 3 (three) times daily as needed.          . Ascorbic Acid (VITAMIN C) 1000 MG tablet   Oral   Take 1,000 mg by mouth 2 (two) times daily.         . butalbital-acetaminophen-caffeine (FIORICET WITH CODEINE) 50-325-40-30 MG per capsule   Oral   Take 1 capsule by  mouth 2 (two) times daily.         . carisoprodol (SOMA) 250 MG tablet   Oral   Take 250 mg by mouth 2 (two) times daily.         . carisoprodol (SOMA) 350 MG tablet   Oral   Take 250 mg by mouth 4 (four) times daily as needed. HAS NOT HAD RX IN 3 MONTH         . celecoxib (CELEBREX) 200 MG capsule   Oral   Take 200 mg by mouth 2 (two) times daily.         . cetirizine (ZYRTEC) 10 MG tablet   Oral   Take 10 mg by mouth as needed.         . diphenhydrAMINE (BENADRYL) 25 MG tablet   Oral   Take 25 mg by mouth daily.         . DULoxetine (CYMBALTA) 20 MG capsule   Oral   Take 20 mg by mouth 3 (three) times daily.         Marland Kitchen estradiol (ESTRACE) 2 MG tablet   Oral   Take 2 mg by mouth daily.         . fluticasone (FLONASE) 50 MCG/ACT nasal spray   Each Nare   Place 1 spray into both nostrils as needed.          . furosemide (LASIX) 20 MG tablet   Oral   Take 40 mg by mouth daily.          Marland Kitchen gabapentin (NEURONTIN) 300 MG capsule   Oral   Take 300 mg by mouth 3 (three) times daily.         . hydrocortisone (ANUSOL-HC) 2.5 % rectal cream   Rectal   Place 1 application rectally daily.         . insulin aspart (NOVOLOG FLEXPEN) 100 UNIT/ML FlexPen   Subcutaneous   Inject 15 Units into the skin 3 (three) times daily.         . insulin glargine (LANTUS) 100 UNIT/ML injection   Subcutaneous   Inject 20 Units into the skin daily.         Marland Kitchen losartan (COZAAR) 100 MG tablet   Oral   Take 100 mg by mouth daily.         . Magnesium (M2 MAGNESIUM) 100 MG CAPS   Oral   Take 100 mg by mouth as needed.         . meclizine (ANTIVERT) 25 MG tablet   Oral   Take 25 mg by mouth 3 (three) times daily as needed.         . Montelukast Sodium (SINGULAIR PO)   Oral   Take by mouth daily.         Marland Kitchen  Naproxen Sodium (ALEVE) 220 MG CAPS   Oral   Take by mouth as needed.         Marland Kitchen olopatadine (PATANOL) 0.1 % ophthalmic solution   Ophthalmic    Apply 1 drop to eye 2 (two) times daily.         . promethazine (PHENERGAN) 25 MG tablet   Oral   Take 25 mg by mouth every 6 (six) hours as needed.         Marland Kitchen SM MULTIPLE VITAMINS/IRON TABS   Oral   Take 1 tablet by mouth daily.         . traZODone (DESYREL) 150 MG tablet   Oral   Take by mouth at bedtime.         . Vilazodone HCl (VIIBRYD) 40 MG TABS   Oral   Take 40 mg by mouth daily.         . vitamin B-12 (CYANOCOBALAMIN) 1000 MCG tablet   Oral   Take 1,000 mcg by mouth daily.         Marland Kitchen docusate sodium (COLACE) 100 MG capsule   Oral   Take 100 mg by mouth as directed.             Allergies:  Codeine; Latex; Morphine and related; Tape; and Nsaids  Review of Systems: Gen:  Denies  fever, sweats, chills HEENT: Denies blurred vision, double vision, ear pain, eye pain, hearing loss, nose bleeds, sore throat Cvc:  No dizziness, chest pain or heaviness Resp:   Cough, sob, sputum production with cough (green) Gi: Denies swallowing difficulty, stomach pain, nausea or vomiting, diarrhea, constipation, bowel incontinence Gu:  Denies bladder incontinence, burning urine Ext:   No Joint pain, stiffness or swelling Skin: No skin rash, easy bruising or bleeding or hives Endoc:  No polyuria, polydipsia , polyphagia or weight change Psych: No depression, insomnia or hallucinations  Other:  All other systems negative  Physical Examination:   VS: BP 128/78 mmHg  Pulse 84  Ht 5\' 7"  (1.702 m)  Wt 300 lb (136.079 kg)  BMI 46.98 kg/m2  SpO2 93%  General Appearance: No distress  Neuro:without focal findings, mental status, speech normal, alert and oriented, cranial nerves 2-12 intact, reflexes normal and symmetric, sensation grossly normal  HEENT: PERRLA, EOM intact, no ptosis, no other lesions noticed; Mallampati  Pulmonary: normal breath sounds., diaphragmatic excursion normal.No wheezing, No rales;   Sputum Production: none in the office  CardiovascularNormal  S1,S2.  No m/r/g.  Abdominal aorta pulsation normal.    Abdomen: Benign, Soft, non-tender, No masses, hepatosplenomegaly, No lymphadenopathy Renal:  No costovertebral tenderness  GU:  No performed at this time. Endoc: No evident thyromegaly, no signs of acromegaly or Cushing features Skin:   warm, no rashes, no ecchymosis  Extremities: normal, no cyanosis, clubbing,  warm with normal capillary refill. Other findings: mild bilateral non pitting LE edema   Radiology results: (The following images and results were reviewed by Dr. Stevenson Clinch). CXR 06/06/12 Findings: The endotracheal tube is not visualized. Left subclavian central venous catheter tip overlies the SVC. The heart and mediastinum are stable. The lung volumes are low. Mild basilar opacities are likely secondary to atelectasis. Possible small pleural effusion.  IMPRESSION: 1. The indication states "vent". However, the endotracheal tube is not visualized and there appears to be nasal cannula oxygen tubing overlying the neck. Correlate for interval extubation. 2. Low lung volumes with mild basilar opacities likely secondary to atelectasis.   Assessment and Plan: Cough  Multifactorial  DDx includes - UACS, allergies, Obesity, OSA, Vocal Cord Dysfunction, tracheal stricture, intubation trauma   The standardized cough guidelines published in Chest by Lissa Morales in 2006 are still the best available and consist of a multiple step process (up to 12!) , not a single office visit,  and are intended  to address this problem logically,  with an alogrithm dependent on response to empiric treatment at  each progressive step  to determine a specific diagnosis with  minimal addtional testing needed. Therefore if adherence is an issue or can't be accurately verified,  it's very unlikely the standard evaluation and treatment will be successful here.    Furthermore, response to therapy (other than acute cough suppression, which should only be used  short term with avoidance of narcotic containing cough syrups if possible), can be a gradual process for which the patient may perceive immediate benefit.  Unlike going to an eye doctor where the best perscription is almost always the first one and is immediately effective, this is almost never the case in the management of chronic cough syndromes. Therefore the patient needs to commit up front to consistently adhere to recommendations  for up to 6 weeks of therapy directed at the likely underlying problem(s) before the response can be reasonably evaluated.  Plan: - ENT evaluation for possible vocal cord dysfunction - full PFTs - wt. Loss - maximize allergy treatment, currently following with allergist and receiving allergy shots.  - evaluation for OSA with split night study - consider HRCT and/or bronchoscopy if the above workup is inconclusive or equivocal.   Post-nasal drip Cont with current allergy regiment per allergist recommendations.   SOB (shortness of breath) Multifactorial - obesity, possible OSA, deconditioning  Plan: - see work up for cough  - weight loss and exercise  Snoring Obstructive Sleep Apnea Screening The patient was screened with the STOP-BANG questionnaire. >3 positive responses is considered a positive screen  Snoring  YES Tiredness  YES Observed Apnea YES Pressure (HTN) YES BMI >35  YES Age > 50  YES Neck >17"  YES Gender (female) FEMALE  Total: 7/8   Screen: POSITIVE  Plan: - split night study  The following was discussed with the patient:   Encouraged proper weight management.  Excessive weight may contribute to snoring.  Monitor sedative use.  Discussed driving precautions and its relationship with hypersomnolence.  Discussed operating dangerous equipment and its relationship with hypersomnolence.  Discussed sleep hygiene, and benefits of a fixed sleep waked time.  The importance of getting eight or more hours of sleep discussed with patient.   Discussed limiting the use of the computer and television before bedtime.  Decrease naps during the day, so night time sleep will become enhanced.  Limit caffeine, and sleep deprivation.  HTN, stroke, and heart failure are potential risk factors of untreated and undiagnosed OSA            Severe obesity (BMI >= 40) OBESITY  Wt: 300 BMI: 46 Discussed importance of weight reduction.  Educated regarding limitation of  intake of greasy/fried foods.  Instructed on benefit of  a low-impact exercise program, starting slowly.  Discussed benefits of 30-45 minutes of some form of exercise daily as well as benefit of supervised exercise program.       Updated Medication List Outpatient Encounter Prescriptions as of 01/31/2014  Medication Sig  . albuterol (PROAIR HFA) 108 (90 BASE) MCG/ACT inhaler Inhale 90 mcg into the lungs as needed.  Marland Kitchen albuterol (PROVENTIL) (  2.5 MG/3ML) 0.083% nebulizer solution Take 2.5 mg by nebulization as needed.  . ALPRAZolam (XANAX) 0.5 MG tablet Take 1 mg by mouth 3 (three) times daily as needed.   . Ascorbic Acid (VITAMIN C) 1000 MG tablet Take 1,000 mg by mouth 2 (two) times daily.  . butalbital-acetaminophen-caffeine (FIORICET WITH CODEINE) 50-325-40-30 MG per capsule Take 1 capsule by mouth 2 (two) times daily.  . carisoprodol (SOMA) 250 MG tablet Take 250 mg by mouth 2 (two) times daily.  . carisoprodol (SOMA) 350 MG tablet Take 250 mg by mouth 4 (four) times daily as needed. HAS NOT HAD RX IN 3 MONTH  . celecoxib (CELEBREX) 200 MG capsule Take 200 mg by mouth 2 (two) times daily.  . cetirizine (ZYRTEC) 10 MG tablet Take 10 mg by mouth as needed.  . diphenhydrAMINE (BENADRYL) 25 MG tablet Take 25 mg by mouth daily.  . DULoxetine (CYMBALTA) 20 MG capsule Take 20 mg by mouth 3 (three) times daily.  Marland Kitchen estradiol (ESTRACE) 2 MG tablet Take 2 mg by mouth daily.  . fluticasone (FLONASE) 50 MCG/ACT nasal spray Place 1 spray into both nostrils as needed.   .  furosemide (LASIX) 20 MG tablet Take 40 mg by mouth daily.   Marland Kitchen gabapentin (NEURONTIN) 300 MG capsule Take 300 mg by mouth 3 (three) times daily.  . hydrocortisone (ANUSOL-HC) 2.5 % rectal cream Place 1 application rectally daily.  . insulin aspart (NOVOLOG FLEXPEN) 100 UNIT/ML FlexPen Inject 15 Units into the skin 3 (three) times daily.  . insulin glargine (LANTUS) 100 UNIT/ML injection Inject 20 Units into the skin daily.  Marland Kitchen losartan (COZAAR) 100 MG tablet Take 100 mg by mouth daily.  . Magnesium (M2 MAGNESIUM) 100 MG CAPS Take 100 mg by mouth as needed.  . meclizine (ANTIVERT) 25 MG tablet Take 25 mg by mouth 3 (three) times daily as needed.  . Montelukast Sodium (SINGULAIR PO) Take by mouth daily.  . Naproxen Sodium (ALEVE) 220 MG CAPS Take by mouth as needed.  Marland Kitchen olopatadine (PATANOL) 0.1 % ophthalmic solution Apply 1 drop to eye 2 (two) times daily.  . promethazine (PHENERGAN) 25 MG tablet Take 25 mg by mouth every 6 (six) hours as needed.  Marland Kitchen SM MULTIPLE VITAMINS/IRON TABS Take 1 tablet by mouth daily.  . traZODone (DESYREL) 150 MG tablet Take by mouth at bedtime.  . Vilazodone HCl (VIIBRYD) 40 MG TABS Take 40 mg by mouth daily.  . vitamin B-12 (CYANOCOBALAMIN) 1000 MCG tablet Take 1,000 mcg by mouth daily.  Marland Kitchen docusate sodium (COLACE) 100 MG capsule Take 100 mg by mouth as directed.  . [DISCONTINUED] ferrous sulfate 325 (65 FE) MG tablet Take 325 mg by mouth.  . [DISCONTINUED] nilotinib (TASIGNA) 150 MG capsule Take 300 mg by mouth every 12 (twelve) hours.  . [DISCONTINUED] oxyCODONE (ROXICODONE) 15 MG immediate release tablet Take 15 mg by mouth every 6 (six) hours as needed. HAS NOT HAD RX IN 3 MONTHS   . [DISCONTINUED] senna (SENOKOT) 8.6 MG TABS Take 2 tablets by mouth.     Orders for this visit: Orders Placed This Encounter  Procedures  . Ambulatory referral to ENT    Referral Priority:  Routine    Referral Type:  Consultation    Referral Reason:  Specialty Services Required     Requested Specialty:  Otolaryngology    Number of Visits Requested:  1  . Pulmonary function test    Standing Status: Future     Number of Occurrences:  Standing Expiration Date: 02/01/2015    Order Specific Question:  Where should this test be performed?    Answer:  [facility]    Order Specific Question:  Full PFT: includes the following: basic spirometry, spirometry pre & post bronchodilator, diffusion capacity (DLCO), lung volumes    Answer:  Full PFT    Order Specific Question:  MIP/MEP    Answer:  No    Order Specific Question:  6 minute walk    Answer:  No    Order Specific Question:  ABG    Answer:  No    Order Specific Question:  Diffusion capacity (DLCO)    Answer:  No    Order Specific Question:  Lung volumes    Answer:  Yes    Order Specific Question:  Methacholine challenge    Answer:  No  . Split night study    Standing Status: Future     Number of Occurrences:      Standing Expiration Date: 02/01/2015    Order Specific Question:  Where should this test be performed:    Answer:  [facility]     Thank  you for the consultation and for allowing [facility] to assist in the care of your patient. Our recommendations are noted above.  Please contact us if we can be of further service.   Vilinda Boehringer, MD [facility] Office Number: [phone]

## 2014-02-02 ENCOUNTER — Telehealth: Payer: Self-pay | Admitting: Internal Medicine

## 2014-02-02 NOTE — Telephone Encounter (Signed)
Spoke with pt. She has been scheduled on 03/02/14 at 12pm in [place]. Nothing further was needed.

## 2014-02-02 NOTE — Telephone Encounter (Signed)
Pt returned Lindsay's call.

## 2014-02-02 NOTE — Telephone Encounter (Signed)
Left message for pt to call back. We need to schedule PFT in [place].

## 2014-02-04 NOTE — Assessment & Plan Note (Signed)
Multifactorial - obesity, possible OSA, deconditioning  Plan: - see work up for cough  - weight loss and exercise

## 2014-02-04 NOTE — Assessment & Plan Note (Signed)
OBESITY  Wt: 300 BMI: 46 Discussed importance of weight reduction.  Educated regarding limitation of  intake of greasy/fried foods.  Instructed on benefit of  a low-impact exercise program, starting slowly.  Discussed benefits of 30-45 minutes of some form of exercise daily as well as benefit of supervised exercise program.

## 2014-02-04 NOTE — Assessment & Plan Note (Signed)
Obstructive Sleep Apnea Screening The patient was screened with the STOP-BANG questionnaire. >3 positive responses is considered a positive screen  Snoring  YES Tiredness  YES Observed Apnea YES Pressure (HTN) YES BMI >35  YES Age > 50  YES Neck >17"  YES Gender (female) FEMALE  Total: 7/8   Screen: POSITIVE  Plan: - split night study  The following was discussed with the patient:   Encouraged proper weight management.  Excessive weight may contribute to snoring.  Monitor sedative use.  Discussed driving precautions and its relationship with hypersomnolence.  Discussed operating dangerous equipment and its relationship with hypersomnolence.  Discussed sleep hygiene, and benefits of a fixed sleep waked time.  The importance of getting eight or more hours of sleep discussed with patient.  Discussed limiting the use of the computer and television before bedtime.  Decrease naps during the day, so night time sleep will become enhanced.  Limit caffeine, and sleep deprivation.  HTN, stroke, and heart failure are potential risk factors of untreated and undiagnosed OSA

## 2014-02-04 NOTE — Assessment & Plan Note (Addendum)
Multifactorial  DDx includes - UACS, allergies, Obesity, OSA, Vocal Cord Dysfunction, tracheal stricture, intubation trauma   The standardized cough guidelines published in Chest by Lissa Morales in 2006 are still the best available and consist of a multiple step process (up to 12!) , not a single office visit,  and are intended  to address this problem logically,  with an alogrithm dependent on response to empiric treatment at  each progressive step  to determine a specific diagnosis with  minimal addtional testing needed. Therefore if adherence is an issue or can't be accurately verified,  it's very unlikely the standard evaluation and treatment will be successful here.    Furthermore, response to therapy (other than acute cough suppression, which should only be used short term with avoidance of narcotic containing cough syrups if possible), can be a gradual process for which the patient may perceive immediate benefit.  Unlike going to an eye doctor where the best perscription is almost always the first one and is immediately effective, this is almost never the case in the management of chronic cough syndromes. Therefore the patient needs to commit up front to consistently adhere to recommendations  for up to 6 weeks of therapy directed at the likely underlying problem(s) before the response can be reasonably evaluated.  Plan: - ENT evaluation for possible vocal cord dysfunction - full PFTs - wt. Loss - maximize allergy treatment, currently following with allergist and receiving allergy shots.  - evaluation for OSA with split night study - consider HRCT and/or bronchoscopy if the above workup is inconclusive or equivocal.

## 2014-02-04 NOTE — Assessment & Plan Note (Signed)
Cont with current allergy regiment per allergist recommendations.

## 2014-02-09 ENCOUNTER — Telehealth: Payer: Self-pay | Admitting: Internal Medicine

## 2014-02-09 NOTE — Telephone Encounter (Signed)
Spoke with pt, she has an ent appt today at 3:00 and called the wrong office.  Nothing further needed.

## 2014-02-16 ENCOUNTER — Ambulatory Visit: Payer: Self-pay | Admitting: Internal Medicine

## 2014-02-21 LAB — CBC CANCER CENTER
BASOS ABS: 0 x10 3/mm (ref 0.0–0.1)
Basophil %: 0.2 %
Eosinophil #: 0.2 x10 3/mm (ref 0.0–0.7)
Eosinophil %: 2.2 %
HCT: 38.2 % (ref 35.0–47.0)
HGB: 12.6 g/dL (ref 12.0–16.0)
Lymphocyte #: 2.1 x10 3/mm (ref 1.0–3.6)
Lymphocyte %: 21 %
MCH: 28.3 pg (ref 26.0–34.0)
MCHC: 32.9 g/dL (ref 32.0–36.0)
MCV: 86 fL (ref 80–100)
MONO ABS: 0.5 x10 3/mm (ref 0.2–0.9)
Monocyte %: 4.6 %
Neutrophil #: 7.2 x10 3/mm — ABNORMAL HIGH (ref 1.4–6.5)
Neutrophil %: 72 %
PLATELETS: 247 x10 3/mm (ref 150–440)
RBC: 4.44 10*6/uL (ref 3.80–5.20)
RDW: 14 % (ref 11.5–14.5)
WBC: 9.9 x10 3/mm (ref 3.6–11.0)

## 2014-02-21 LAB — PHOSPHORUS: PHOSPHORUS: 3.2 mg/dL (ref 2.5–4.9)

## 2014-02-21 LAB — BASIC METABOLIC PANEL
ANION GAP: 8 (ref 7–16)
BUN: 16 mg/dL (ref 7–18)
CALCIUM: 9.2 mg/dL (ref 8.5–10.1)
CO2: 32 mmol/L (ref 21–32)
Chloride: 99 mmol/L (ref 98–107)
Creatinine: 0.95 mg/dL (ref 0.60–1.30)
EGFR (African American): >60
Glucose: 119 mg/dL — ABNORMAL HIGH (ref 65–99)
Osmolality: 280 (ref 275–301)
Potassium: 4.1 mmol/L (ref 3.5–5.1)
SODIUM: 139 mmol/L (ref 136–145)

## 2014-02-21 LAB — HEPATIC FUNCTION PANEL A (ARMC)
ALBUMIN: 3 g/dL — AB (ref 3.4–5.0)
ALK PHOS: 72 U/L
ALT: 22 U/L
Bilirubin, Direct: 0.1 mg/dL (ref 0.0–0.2)
Bilirubin,Total: 0.3 mg/dL (ref 0.2–1.0)
SGOT(AST): 17 U/L (ref 15–37)
TOTAL PROTEIN: 7.1 g/dL (ref 6.4–8.2)

## 2014-02-21 LAB — MAGNESIUM: Magnesium: 1.4 mg/dL — ABNORMAL LOW

## 2014-02-25 ENCOUNTER — Ambulatory Visit: Payer: Self-pay | Admitting: Internal Medicine

## 2014-02-28 ENCOUNTER — Telehealth: Payer: Self-pay | Admitting: Internal Medicine

## 2014-02-28 NOTE — Telephone Encounter (Signed)
Left message for pt to call back x1 

## 2014-03-01 NOTE — Telephone Encounter (Signed)
PFT has been rescheduled to 03/10/14 at 3pm.

## 2014-03-02 LAB — BASIC METABOLIC PANEL
Anion Gap: 7 (ref 7–16)
BUN: 18 mg/dL (ref 7–18)
CHLORIDE: 97 mmol/L — AB (ref 98–107)
CREATININE: 1.26 mg/dL (ref 0.60–1.30)
Calcium, Total: 9.1 mg/dL (ref 8.5–10.1)
Co2: 32 mmol/L (ref 21–32)
EGFR (Non-African Amer.): 46 — ABNORMAL LOW
GFR CALC AF AMER: 56 — AB
Glucose: 141 mg/dL — ABNORMAL HIGH (ref 65–99)
OSMOLALITY: 276 (ref 275–301)
POTASSIUM: 4 mmol/L (ref 3.5–5.1)
Sodium: 136 mmol/L (ref 136–145)

## 2014-03-02 LAB — HEPATIC FUNCTION PANEL A (ARMC)
AST: 17 U/L (ref 15–37)
Albumin: 2.9 g/dL — ABNORMAL LOW (ref 3.4–5.0)
Alkaline Phosphatase: 69 U/L
Bilirubin, Direct: 0.1 mg/dL (ref 0.0–0.2)
Bilirubin,Total: 0.4 mg/dL (ref 0.2–1.0)
SGPT (ALT): 21 U/L
Total Protein: 7.1 g/dL (ref 6.4–8.2)

## 2014-03-02 LAB — MAGNESIUM: Magnesium: 1.7 mg/dL — ABNORMAL LOW

## 2014-03-02 LAB — CBC CANCER CENTER
BASOS ABS: 0 x10 3/mm (ref 0.0–0.1)
Basophil %: 0.4 %
Eosinophil #: 0.1 x10 3/mm (ref 0.0–0.7)
Eosinophil %: 1.9 %
HCT: 37.6 % (ref 35.0–47.0)
HGB: 12.4 g/dL (ref 12.0–16.0)
LYMPHS ABS: 1.6 x10 3/mm (ref 1.0–3.6)
Lymphocyte %: 23.8 %
MCH: 28.8 pg (ref 26.0–34.0)
MCHC: 33 g/dL (ref 32.0–36.0)
MCV: 87 fL (ref 80–100)
MONOS PCT: 6 %
Monocyte #: 0.4 x10 3/mm (ref 0.2–0.9)
NEUTROS ABS: 4.5 x10 3/mm (ref 1.4–6.5)
Neutrophil %: 67.9 %
Platelet: 234 x10 3/mm (ref 150–440)
RBC: 4.32 10*6/uL (ref 3.80–5.20)
RDW: 14.3 % (ref 11.5–14.5)
WBC: 6.6 x10 3/mm (ref 3.6–11.0)

## 2014-03-02 LAB — PHOSPHORUS: Phosphorus: 2.8 mg/dL (ref 2.5–4.9)

## 2014-03-03 ENCOUNTER — Telehealth: Payer: Self-pay | Admitting: Internal Medicine

## 2014-03-03 NOTE — Telephone Encounter (Signed)
LMTCB

## 2014-03-07 ENCOUNTER — Ambulatory Visit: Payer: Medicaid Other | Admitting: Internal Medicine

## 2014-03-08 NOTE — Telephone Encounter (Signed)
Pt rescheduled for PFT and OV with [facility] in [place] .  PFT at 2pm and OV at 3pm. Nothing further needed.

## 2014-03-09 IMAGING — CR DG CHEST 1V PORT
1 series · 1 of 1 positions shown · non-contrast
Comparison: none

REASON FOR EXAM: vent
COMMENTS:

[ap]
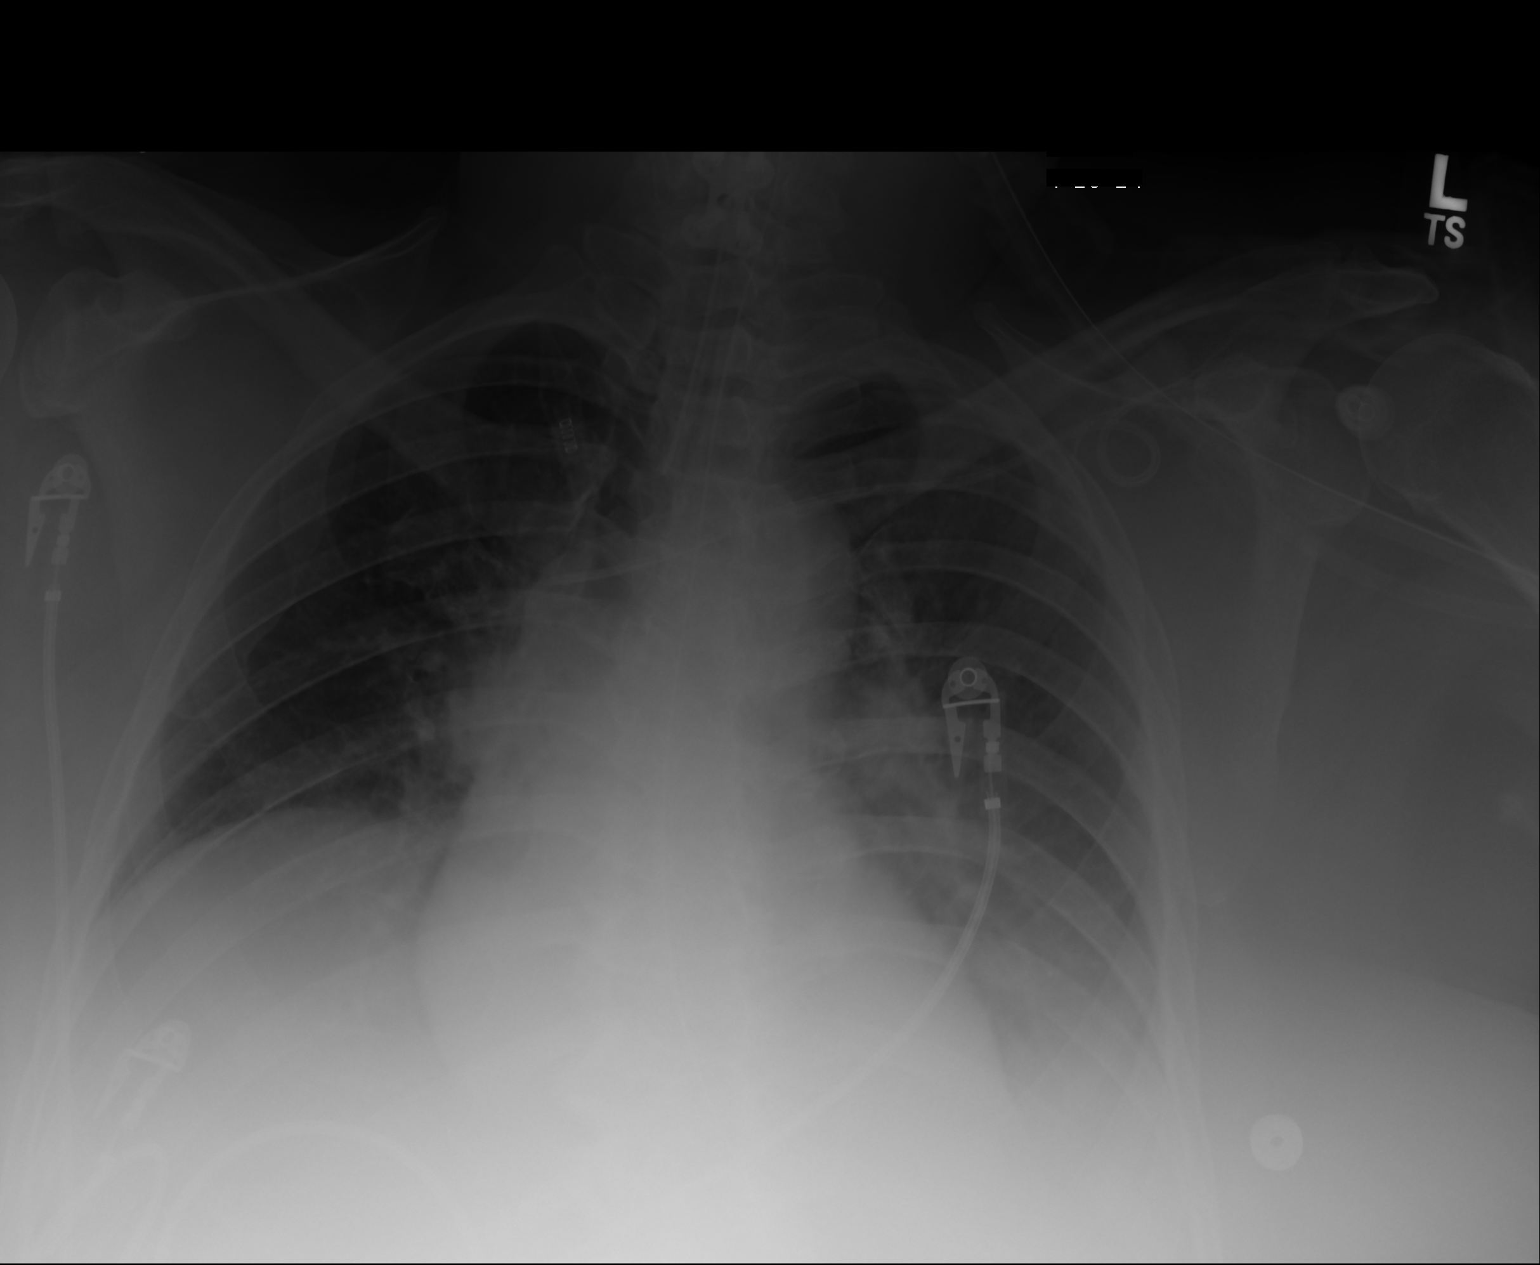

[1 of 1 positions shown; findings below may reference images not displayed]

PROCEDURE:     DXR - DXR PORTABLE CHEST SINGLE VIEW  - June 04, 2012  [DATE]

RESULT:     Comparison made to prior study 06/03/2012. Endotracheal tube and
NG tube in good position. Central line tip noted projected over the
innominate vein. Proximal portion of the central line is coiled in the
region of left subclavian vein.
IMPRESSION: 1. Tube positionings as described above.
2. Left lower lobe infiltrate. Left lower lobe infiltrate present. The left
lower lobe infiltrate may have progressed slightly from 06/03/2012.

## 2014-03-10 ENCOUNTER — Ambulatory Visit: Payer: Medicaid Other | Admitting: Internal Medicine

## 2014-03-11 IMAGING — CR DG CHEST 1V PORT
1 series · 1 of 1 positions shown · non-contrast
Comparison: none

REASON FOR EXAM: vent
COMMENTS:

PROCEDURE:     DXR - DXR PORTABLE CHEST SINGLE VIEW  - June 06, 2012  [DATE]
RESULT:     Comparison: 06/04/2012

[ap]
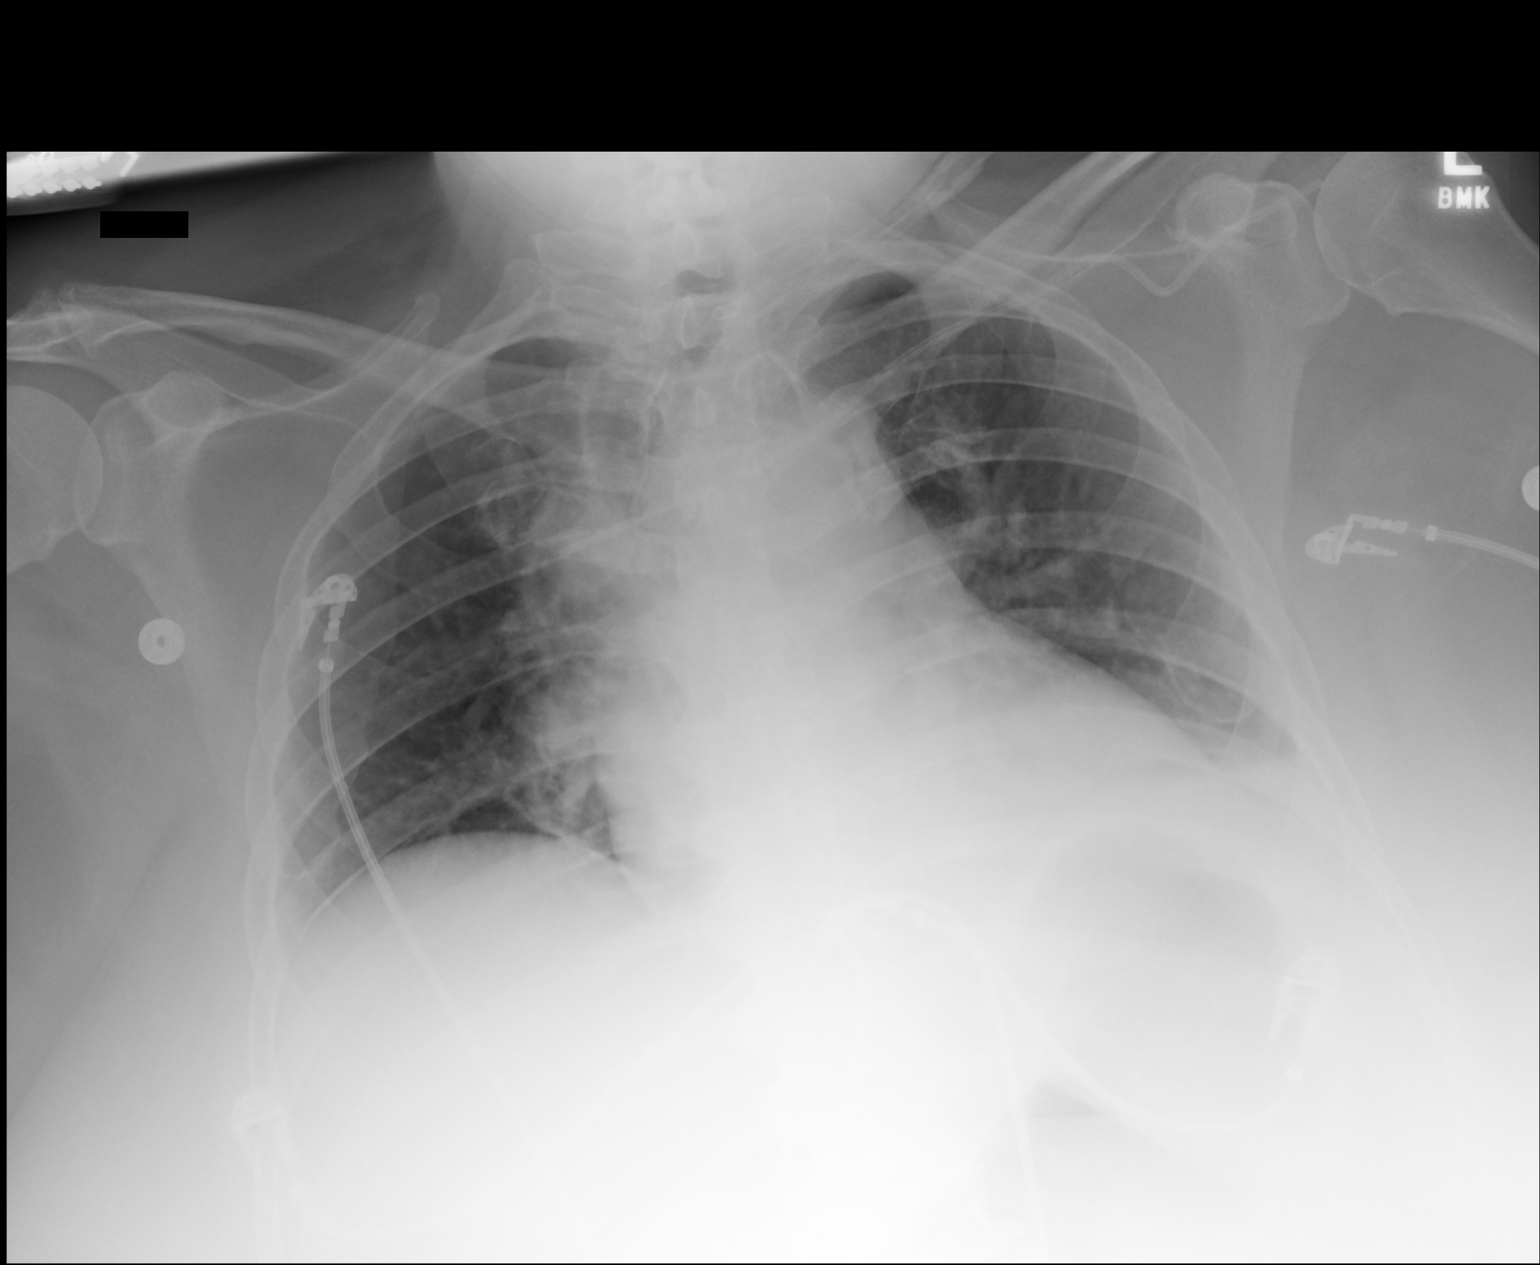

[1 of 1 positions shown; findings below may reference images not displayed]

FINDINGS: The endotracheal tube is not visualized. Left subclavian central venous
catheter tip overlies the SVC. The heart and mediastinum are stable. The
lung volumes are low. Mild basilar opacities are likely secondary to
atelectasis. Possible small pleural effusion.
IMPRESSION: 1. The indication states "vent". However, the endotracheal tube is not
visualized and there appears to be nasal cannula oxygen tubing overlying the
neck. Correlate for interval extubation.
2. Low lung volumes with mild basilar opacities likely secondary to
atelectasis.

## 2014-03-16 ENCOUNTER — Ambulatory Visit: Payer: Medicaid Other | Admitting: Internal Medicine

## 2014-03-28 ENCOUNTER — Ambulatory Visit: Payer: Self-pay | Admitting: Internal Medicine

## 2014-04-04 ENCOUNTER — Telehealth: Payer: Self-pay | Admitting: Internal Medicine

## 2014-04-04 NOTE — Telephone Encounter (Signed)
Called and spoke to pt. Pt c/o hemoptysis (coughing up dime and quarter size amounts of blood and started on 04/03/14), increase in SOB, mid sternal CP, extreme weakness. Pt stated she had a 104 fever on 04/02/14 and took 162mg  of ASA and aleve and pt stated it resolved the fever.  Informed pt she will need to go to ED or call EMS. Pt stated she will not go to [facility] without knowing VM will be there (VM is on vacation). Informed pt that if she does not feel comfortable going to [facility] then she can go to [name] or [facility], have her husband drive her. Pt verbalized understanding and stated she would go to ED in [place] but hesitant about decision. Pt alert and oriented x 4. Appt made with VM on 04/05/2014 if pt does not go to ED. Will call pt back to check on her.   Will forward to myself to check back in with pt.

## 2014-04-04 NOTE — Telephone Encounter (Signed)
Called and left voicemail on pt's phone that I was checking in on her and to call back if needed. Will sign off.

## 2014-04-04 NOTE — Telephone Encounter (Signed)
lmtcb for pt.  

## 2014-04-05 ENCOUNTER — Encounter: Payer: Self-pay | Admitting: Internal Medicine

## 2014-04-05 ENCOUNTER — Ambulatory Visit (INDEPENDENT_AMBULATORY_CARE_PROVIDER_SITE_OTHER): Payer: Medicaid Other | Admitting: Internal Medicine

## 2014-04-05 VITALS — BP 112/80 | HR 85 | Temp 98.0°F | Ht 67.0 in | Wt 300.0 lb

## 2014-04-05 DIAGNOSIS — R05 Cough: Secondary | ICD-10-CM

## 2014-04-05 DIAGNOSIS — J209 Acute bronchitis, unspecified: Secondary | ICD-10-CM | POA: Insufficient documentation

## 2014-04-05 DIAGNOSIS — R059 Cough, unspecified: Secondary | ICD-10-CM

## 2014-04-05 MED ORDER — LEVOFLOXACIN 750 MG PO TABS: 750.0000 mg | ORAL_TABLET | Freq: Every day | ORAL | Status: AC

## 2014-04-05 MED ORDER — BENZONATATE 200 MG PO CAPS: 200.0000 mg | ORAL_CAPSULE | Freq: Three times a day (TID) | ORAL | Status: DC | PRN

## 2014-04-05 NOTE — Assessment & Plan Note (Addendum)
Most likely cause of hemoptysis is URI/bronchitis - patient with fever, body aches, sob, productive sputum and hemoptysis - most likely viral, however with her immunosuppressed from [facility] treatment, will treat as CAP - levaquin 750mg  daily x 7 days - tessalon pearles - CXR  - 1 months follow up.

## 2014-04-05 NOTE — Patient Instructions (Signed)
Cough\hemoptysis - related to URI\Bronchitis - levaquin 1 tab (750mg ) daily x 7 days - Chest Xray - tessalon pearles as directed.  - if symptoms are getting worst then goto the ER.    Follow up in 1 month with Dr. Stevenson Clinch

## 2014-04-05 NOTE — Progress Notes (Signed)
MRN# [id number] Tara Levine 1955/08/02   CC:"I feel sick, coughing up blood" Chief Complaint  Patient presents with  . Follow-up    Pt feels like she has pneumo. She has been coughing up blood. She had a 104 temp on  Sunday 04/03/14. She has had chills, wheezing and chest tightness.      Events since last clinic visit: Patient presents today for an acute care visit.  She was previously seen for chronic cough, was being worked up for OSA, COPD, but did not have the pfts\6MWT\split night study done. She has a history of CML, and her daily dose of bosnulib was increased a few weeks ago. On Sunday she was noted to have a fever of 104 F, was coughing up thick phlegm with blood and dark brown sputum, she took aleve and ASA with relief; she has been coughing since then. Cough since Sunday has been mixed with intermittent blood and dark brown phlegm.  She also endorses a generalized rash.  Since Sunday no fever, but continued cough.  She states she has seen ENT, and was told that her vocal cords were okay.  She describes that hemoptysis has mild - dime size, sometimes with flecks of blood, gradually improving since Sunday.      PMHX:   Past Medical History  Diagnosis Date  . Unspecified essential hypertension   . HLD (hyperlipidemia)   . DM type 2 (diabetes mellitus, type 2)   . Environmental allergies   . Osteoarthritis   . IDA (iron deficiency anemia)     chronic  . History of gastric ulcer   . Panic disorder   . PSS (progressive systemic sclerosis)   . CML (chronic myelocytic leukemia)    Surgical Hx:  Past Surgical History  Procedure Laterality Date  . Back and neck surgery  1999, 2009    had rods placed  . Partial hysterectomy      age 1, no cancer  . Carpal tunnel release     Family Hx:  Family History  Problem Relation Age of Onset  . Diabetes    . Hypertension    . Heart disease    . Prostate cancer     Social Hx:   History  Substance Use Topics  . Smoking  status: Former Research scientist (life sciences)  . Smokeless tobacco: Never Used     Comment: quit 1985  . Alcohol Use: No   Medication:   Current Outpatient Rx  Name  Route  Sig  Dispense  Refill  . albuterol (PROAIR HFA) 108 (90 BASE) MCG/ACT inhaler   Inhalation   Inhale 90 mcg into the lungs as needed.         Marland Kitchen albuterol (PROVENTIL) (2.5 MG/3ML) 0.083% nebulizer solution   Nebulization   Take 2.5 mg by nebulization as needed.         . ALPRAZolam (XANAX) 0.5 MG tablet   Oral   Take 1 mg by mouth 3 (three) times daily as needed.          . Ascorbic Acid (VITAMIN C) 1000 MG tablet   Oral   Take 1,000 mg by mouth 2 (two) times daily.         . butalbital-acetaminophen-caffeine (FIORICET WITH CODEINE) 50-325-40-30 MG per capsule   Oral   Take 1 capsule by mouth 2 (two) times daily.         . carisoprodol (SOMA) 250 MG tablet   Oral   Take 250 mg by mouth 2 (two) times  daily.         . celecoxib (CELEBREX) 200 MG capsule   Oral   Take 200 mg by mouth 2 (two) times daily.         . cetirizine (ZYRTEC) 10 MG tablet   Oral   Take 10 mg by mouth as needed.         . diphenhydrAMINE (BENADRYL) 25 MG tablet   Oral   Take 25 mg by mouth daily.         . DULoxetine (CYMBALTA) 20 MG capsule   Oral   Take 20 mg by mouth 3 (three) times daily.         Marland Kitchen estradiol (ESTRACE) 2 MG tablet   Oral   Take 2 mg by mouth daily.         . fluticasone (FLONASE) 50 MCG/ACT nasal spray   Each Nare   Place 1 spray into both nostrils as needed.          . furosemide (LASIX) 20 MG tablet   Oral   Take 40 mg by mouth daily.          Marland Kitchen gabapentin (NEURONTIN) 300 MG capsule   Oral   Take 300 mg by mouth 3 (three) times daily.         . hydrocortisone (ANUSOL-HC) 2.5 % rectal cream   Rectal   Place 1 application rectally daily.         . insulin aspart (NOVOLOG FLEXPEN) 100 UNIT/ML FlexPen   Subcutaneous   Inject 15 Units into the skin 3 (three) times daily.         .  insulin glargine (LANTUS) 100 UNIT/ML injection   Subcutaneous   Inject 20 Units into the skin daily.         Marland Kitchen losartan (COZAAR) 100 MG tablet   Oral   Take 100 mg by mouth daily.         . Magnesium (M2 MAGNESIUM) 100 MG CAPS   Oral   Take 500 mg by mouth as needed.          . meclizine (ANTIVERT) 25 MG tablet   Oral   Take 25 mg by mouth 3 (three) times daily as needed.         . Montelukast Sodium (SINGULAIR PO)   Oral   Take by mouth daily.         . Naproxen Sodium (ALEVE) 220 MG CAPS   Oral   Take by mouth as needed.         Marland Kitchen olopatadine (PATANOL) 0.1 % ophthalmic solution   Ophthalmic   Apply 1 drop to eye 2 (two) times daily.         . promethazine (PHENERGAN) 25 MG tablet   Oral   Take 25 mg by mouth every 6 (six) hours as needed.         Marland Kitchen SM MULTIPLE VITAMINS/IRON TABS   Oral   Take 1 tablet by mouth daily.         . traZODone (DESYREL) 150 MG tablet   Oral   Take by mouth at bedtime.         . Vilazodone HCl (VIIBRYD) 40 MG TABS   Oral   Take 40 mg by mouth daily.         . vitamin B-12 (CYANOCOBALAMIN) 1000 MCG tablet   Oral   Take 1,000 mcg by mouth daily.         . carisoprodol (  SOMA) 350 MG tablet   Oral   Take 250 mg by mouth 4 (four) times daily as needed. HAS NOT HAD RX IN 3 MONTH         . docusate sodium (COLACE) 100 MG capsule   Oral   Take 100 mg by mouth as directed.            Review of Systems: Gen:  Denies  fever, sweats, chills HEENT: Denies blurred vision, double vision, ear pain, eye pain, hearing loss, nose bleeds, sore throat Cvc:  No dizziness, chest pain or heaviness Resp:   Productive cough, mild intermittent hemoptysis. Gi: Denies swallowing difficulty, stomach pain, nausea or vomiting, diarrhea, constipation, bowel incontinence Gu:  Denies bladder incontinence, burning urine Ext:   No Joint pain, stiffness or swelling Skin: No skin rash, easy bruising or bleeding or hives Endoc:  No  polyuria, polydipsia , polyphagia or weight change Psych: No depression, insomnia or hallucinations  Other:  All other systems negative  Allergies:  Codeine; Latex; Morphine and related; Tape; and Nsaids  Physical Examination:  VS: BP 112/80 mmHg  Pulse 85  Ht 5\' 7"  (1.702 m)  SpO2 94%  General Appearance: No distress  Neuro: EXAM: without focal findings, mental status, speech normal, alert and oriented, cranial nerves 2-12 grossly normal  HEENT: PERRLA, EOM intact, no ptosis, no other lesions noticed Pulmonary:Exam: normal breath sounds., diaphragmatic excursion normal.No wheezing, No rales  : Sputum production - positive with mild hemoptysis, thick white Cardiovascular:@ Exam:  Normal S1,S2.  No m/r/g.     Abdomen:Exam: Benign, Soft, non-tender, No masses  Skin:   warm, generalized, fine rash, mild macular Extremities: normal, no cyanosis, clubbing, no edema, warm with normal capillary refill.   Labs results:  BMP No results found for: NA, K, CL, CO2, GLUCOSE, BUN, CREATININE   CBC No flowsheet data found.   Rad results: none     Assessment and Plan: Acute bronchitis Most likely cause of hemoptysis is URI/bronchitis - patient with fever, body aches, sob, productive sputum and hemoptysis - most likely viral, however with her immunosuppressed from [facility] treatment, will treat as CAP - levaquin 750mg  daily x 7 days - tessalon pearles - CXR  - 1 months follow up.        Updated Medication List Outpatient Encounter Prescriptions as of 04/05/2014  Medication Sig  . albuterol (PROAIR HFA) 108 (90 BASE) MCG/ACT inhaler Inhale 90 mcg into the lungs as needed.  Marland Kitchen albuterol (PROVENTIL) (2.5 MG/3ML) 0.083% nebulizer solution Take 2.5 mg by nebulization as needed.  . ALPRAZolam (XANAX) 0.5 MG tablet Take 1 mg by mouth 3 (three) times daily as needed.   . Ascorbic Acid (VITAMIN C) 1000 MG tablet Take 1,000 mg by mouth 2 (two) times daily.  . butalbital-acetaminophen-caffeine  (FIORICET WITH CODEINE) 50-325-40-30 MG per capsule Take 1 capsule by mouth 2 (two) times daily.  . carisoprodol (SOMA) 250 MG tablet Take 250 mg by mouth 2 (two) times daily.  . celecoxib (CELEBREX) 200 MG capsule Take 200 mg by mouth 2 (two) times daily.  . cetirizine (ZYRTEC) 10 MG tablet Take 10 mg by mouth as needed.  . diphenhydrAMINE (BENADRYL) 25 MG tablet Take 25 mg by mouth daily.  . DULoxetine (CYMBALTA) 20 MG capsule Take 20 mg by mouth 3 (three) times daily.  Marland Kitchen estradiol (ESTRACE) 2 MG tablet Take 2 mg by mouth daily.  . fluticasone (FLONASE) 50 MCG/ACT nasal spray Place 1 spray into both nostrils as needed.   Marland Kitchen  furosemide (LASIX) 20 MG tablet Take 40 mg by mouth daily.   Marland Kitchen gabapentin (NEURONTIN) 300 MG capsule Take 300 mg by mouth 3 (three) times daily.  . hydrocortisone (ANUSOL-HC) 2.5 % rectal cream Place 1 application rectally daily.  . insulin aspart (NOVOLOG FLEXPEN) 100 UNIT/ML FlexPen Inject 15 Units into the skin 3 (three) times daily.  . insulin glargine (LANTUS) 100 UNIT/ML injection Inject 20 Units into the skin daily.  Marland Kitchen losartan (COZAAR) 100 MG tablet Take 100 mg by mouth daily.  . Magnesium (M2 MAGNESIUM) 100 MG CAPS Take 500 mg by mouth as needed.   . meclizine (ANTIVERT) 25 MG tablet Take 25 mg by mouth 3 (three) times daily as needed.  . Montelukast Sodium (SINGULAIR PO) Take by mouth daily.  . Naproxen Sodium (ALEVE) 220 MG CAPS Take by mouth as needed.  Marland Kitchen olopatadine (PATANOL) 0.1 % ophthalmic solution Apply 1 drop to eye 2 (two) times daily.  . promethazine (PHENERGAN) 25 MG tablet Take 25 mg by mouth every 6 (six) hours as needed.  Marland Kitchen SM MULTIPLE VITAMINS/IRON TABS Take 1 tablet by mouth daily.  . traZODone (DESYREL) 150 MG tablet Take by mouth at bedtime.  . Vilazodone HCl (VIIBRYD) 40 MG TABS Take 40 mg by mouth daily.  . vitamin B-12 (CYANOCOBALAMIN) 1000 MCG tablet Take 1,000 mcg by mouth daily.  . carisoprodol (SOMA) 350 MG tablet Take 250 mg by mouth 4  (four) times daily as needed. HAS NOT HAD RX IN 3 MONTH  . docusate sodium (COLACE) 100 MG capsule Take 100 mg by mouth as directed.    Orders for this visit: Orders Placed This Encounter  Procedures  . DG Chest 2 View    Standing Status: Future     Number of Occurrences:      Standing Expiration Date: 06/04/2015    Scheduling Instructions:     Please schedule [facility]  Pt to have done week of March 7    Order Specific Question:  Reason for Exam (SYMPTOM  OR DIAGNOSIS REQUIRED)    Answer:  cough    Order Specific Question:  Is the patient pregnant?    Answer:  No    Order Specific Question:  Preferred imaging location?    Answer:  External    Thank  you for the visitation and for allowing  [facility] to assist in the care of your patient. Our recommendations are noted above.  Please contact us if we can be of further service.  Vilinda Boehringer, MD [facility] Office Number: [phone]

## 2014-04-12 ENCOUNTER — Other Ambulatory Visit: Payer: Self-pay | Admitting: Radiology

## 2014-04-12 DIAGNOSIS — R0602 Shortness of breath: Secondary | ICD-10-CM

## 2014-04-15 ENCOUNTER — Telehealth: Payer: Self-pay | Admitting: Internal Medicine

## 2014-04-15 NOTE — Telephone Encounter (Signed)
Spoke with Lindon Romp at [facility]. States that Dr. Stevenson Clinch wants pt scheduled for an Echo and Stress Test. There aren't any orders in the system. I don't see anything in the pt's OV note about these test either.  Dr. Stevenson Clinch - please advise. Thanks.

## 2014-04-18 ENCOUNTER — Telehealth: Payer: Self-pay | Admitting: Internal Medicine

## 2014-04-18 ENCOUNTER — Telehealth: Payer: Self-pay

## 2014-04-18 NOTE — Telephone Encounter (Signed)
Pt called, states she cannot walk on the treadmill for her test on Thursday, states she has rods in her back and will be in extreme pain. Please call.

## 2014-04-18 NOTE — Telephone Encounter (Signed)
I have reviewed by OP notes, and do see any documentation nor do I recall a stress test for this patient. She has a history of CML and was previously on chemo drugs and I may have suggested an ECHO as part of future workup if split night study\65mwt\pfts were not revealing of an etiology for her cough.  She has not performed these studies, due to being intermittently ill.  She has seen Dr. Rockey Situ [facility]) in the past, and if she request a stress test, then she would first need to be evaluated by him. No need for ECHO at this time, I will decide on this at follow up visit.

## 2014-04-18 NOTE — Telephone Encounter (Signed)
We didn't schedule this.  She will need to speak w/ Dr. Stevenson Clinch about ordering a different type of test.

## 2014-04-18 NOTE — Telephone Encounter (Signed)
Attempted to call. Was on hold for 15 minutes. Per the telephone note from 04/15/14:  Vilinda Boehringer, MD at 04/18/2014 8:45 AM     Status: Signed       Expand All Collapse All   I have reviewed by OP notes, and do see any documentation nor do I recall a stress test for this patient. She has a history of CML and was previously on chemo drugs and I may have suggested an ECHO as part of future workup if split night study\63mwt\pfts were not revealing of an etiology for her cough. She has not performed these studies, due to being intermittently ill.  She has seen Dr. Rockey Situ [facility]) in the past, and if she request a stress test, then she would first need to be evaluated by him. No need for ECHO at this time, I will decide on this at follow up visit.

## 2014-04-18 NOTE — Telephone Encounter (Signed)
Spoke with Tara Levine, made aware of the below per Dr Stevenson Clinch.  Tara Levine states that she is going to cancel the ECHO for now and speak with Dr Rockey Situ about ordering and scheduling the stress test.  I asked that Dr Stevenson Clinch be included on the results when they are returned.  Will send to Dr Stevenson Clinch as [name].  Nothing further needed.

## 2014-04-19 NOTE — Telephone Encounter (Signed)
Spoke with Tara Levine, notified her of the recs below.  Pt has appt with Dr. Rockey Situ next Friday, she can be evaluated for stress test at that time.  Nothing further needed.

## 2014-04-21 ENCOUNTER — Other Ambulatory Visit: Payer: Medicaid Other

## 2014-04-21 ENCOUNTER — Telehealth: Payer: Self-pay | Admitting: Internal Medicine

## 2014-04-21 ENCOUNTER — Encounter: Payer: Medicaid Other | Admitting: Cardiovascular Disease

## 2014-04-21 NOTE — Telephone Encounter (Signed)
Spoke with Dr. Ma Hillock [name]), who is managing her CML, who stated that her weight is increasing (most likely inc water weight secondary to chemo).  He currently has her on lasix and is going to add aldactone. He agreed that having a cardiology follow for further evaluation of cardiomyopathy would be beneficial.

## 2014-04-26 ENCOUNTER — Ambulatory Visit
Admit: 2014-04-26 | Disposition: A | Discharge status: Home / Self Care | Payer: Self-pay | Attending: Internal Medicine | Admitting: Internal Medicine

## 2014-04-29 ENCOUNTER — Ambulatory Visit: Payer: Medicaid Other | Admitting: Cardiovascular Disease

## 2014-04-29 ENCOUNTER — Encounter: Payer: Self-pay | Admitting: *Deleted

## 2014-05-04 ENCOUNTER — Encounter: Payer: Self-pay | Admitting: Internal Medicine

## 2014-05-04 ENCOUNTER — Ambulatory Visit (INDEPENDENT_AMBULATORY_CARE_PROVIDER_SITE_OTHER): Payer: Medicaid Other | Admitting: Internal Medicine

## 2014-05-04 ENCOUNTER — Ambulatory Visit: Payer: Self-pay | Admitting: Internal Medicine

## 2014-05-04 ENCOUNTER — Emergency Department: Payer: Self-pay | Admitting: Emergency Medicine

## 2014-05-04 VITALS — BP 114/62 | HR 87

## 2014-05-04 DIAGNOSIS — R0602 Shortness of breath: Secondary | ICD-10-CM

## 2014-05-04 MED ORDER — LEVALBUTEROL HCL 0.63 MG/3ML IN NEBU
0.6300 mg | INHALATION_SOLUTION | Freq: Once | RESPIRATORY_TRACT | Status: AC
  Administered 2014-05-04: 0.63 mg via RESPIRATORY_TRACT

## 2014-05-04 NOTE — Progress Notes (Signed)
MRN# [id number] Tara Levine [birth year]   CC: Chief Complaint  Patient presents with  . Acute Visit    Pt in office c/o SOB, chest tightness. Reports started while in radiology for cxr prior to arriving to office for follow up.     Events since last clinic visit:   Patient presents today for followup visit. Upon checking patient noted to be severely dyspneic and diaphoretic. Patient with rapid breathing stating that she cannot get her breath of air. She endorses chest tightness and shortness of breath at the same time. 911 called and patient transferred to the emergency room for further evaluation. While waiting for EMS patient received 1 nebulizer treatment and encouraged to have slow deep breathing.   PMHX:   Past Medical History  Diagnosis Date  . Unspecified essential hypertension   . HLD (hyperlipidemia)   . DM type 2 (diabetes mellitus, type 2)   . Environmental allergies   . Osteoarthritis   . IDA (iron deficiency anemia)     chronic  . History of gastric ulcer   . Panic disorder   . PSS (progressive systemic sclerosis)   . CML (chronic myelocytic leukemia)    Surgical Hx:  Past Surgical History  Procedure Laterality Date  . Back and neck surgery  1999, 2009    had rods placed  . Partial hysterectomy      age 59, no cancer  . Carpal tunnel release     Family Hx:  Family History  Problem Relation Age of Onset  . Diabetes Mother   . Hypertension Mother   . Heart disease Mother   . Prostate cancer Father   . Heart disease Father    Social Hx:   History  Substance Use Topics  . Smoking status: Former Research scientist (life sciences)  . Smokeless tobacco: Never Used     Comment: quit 1985  . Alcohol Use: No   Medication:   Current Outpatient Rx  Name  Route  Sig  Dispense  Refill  . albuterol (PROAIR HFA) 108 (90 BASE) MCG/ACT inhaler   Inhalation   Inhale 90 mcg into the lungs as needed.         Marland Kitchen albuterol (PROVENTIL) (2.5 MG/3ML) 0.083% nebulizer solution   Nebulization    Take 2.5 mg by nebulization as needed.         . ALPRAZolam (XANAX) 0.5 MG tablet   Oral   Take 1 mg by mouth 3 (three) times daily as needed.          . Ascorbic Acid (VITAMIN C) 1000 MG tablet   Oral   Take 1,000 mg by mouth 2 (two) times daily.         . benzonatate (TESSALON) 200 MG capsule   Oral   Take 1 capsule (200 mg total) by mouth 3 (three) times daily as needed for cough.   90 capsule   0   . butalbital-acetaminophen-caffeine (FIORICET WITH CODEINE) 50-325-40-30 MG per capsule   Oral   Take 1 capsule by mouth 2 (two) times daily.         . carisoprodol (SOMA) 250 MG tablet   Oral   Take 250 mg by mouth 2 (two) times daily.         . carisoprodol (SOMA) 350 MG tablet   Oral   Take 250 mg by mouth 4 (four) times daily as needed. HAS NOT HAD RX IN 3 MONTH         . celecoxib (CELEBREX) 200 MG  capsule   Oral   Take 200 mg by mouth 2 (two) times daily.         . cetirizine (ZYRTEC) 10 MG tablet   Oral   Take 10 mg by mouth as needed.         . diphenhydrAMINE (BENADRYL) 25 MG tablet   Oral   Take 25 mg by mouth daily.         Marland Kitchen docusate sodium (COLACE) 100 MG capsule   Oral   Take 100 mg by mouth as directed.         . DULoxetine (CYMBALTA) 20 MG capsule   Oral   Take 20 mg by mouth 3 (three) times daily.         Marland Kitchen estradiol (ESTRACE) 2 MG tablet   Oral   Take 2 mg by mouth daily.         . fluticasone (FLONASE) 50 MCG/ACT nasal spray   Each Nare   Place 1 spray into both nostrils as needed.          . furosemide (LASIX) 20 MG tablet   Oral   Take 40 mg by mouth daily.          Marland Kitchen gabapentin (NEURONTIN) 300 MG capsule   Oral   Take 300 mg by mouth 3 (three) times daily.         . hydrocortisone (ANUSOL-HC) 2.5 % rectal cream   Rectal   Place 1 application rectally daily.         . insulin aspart (NOVOLOG FLEXPEN) 100 UNIT/ML FlexPen   Subcutaneous   Inject 15 Units into the skin 3 (three) times daily.          . insulin glargine (LANTUS) 100 UNIT/ML injection   Subcutaneous   Inject 20 Units into the skin daily.         Marland Kitchen losartan (COZAAR) 100 MG tablet   Oral   Take 100 mg by mouth daily.         . Magnesium (M2 MAGNESIUM) 100 MG CAPS   Oral   Take 500 mg by mouth as needed.          . meclizine (ANTIVERT) 25 MG tablet   Oral   Take 25 mg by mouth 3 (three) times daily as needed.         . Montelukast Sodium (SINGULAIR PO)   Oral   Take by mouth daily.         . Naproxen Sodium (ALEVE) 220 MG CAPS   Oral   Take by mouth as needed.         Marland Kitchen olopatadine (PATANOL) 0.1 % ophthalmic solution   Ophthalmic   Apply 1 drop to eye 2 (two) times daily.         . promethazine (PHENERGAN) 25 MG tablet   Oral   Take 25 mg by mouth every 6 (six) hours as needed.         Marland Kitchen SM MULTIPLE VITAMINS/IRON TABS   Oral   Take 1 tablet by mouth daily.         . traZODone (DESYREL) 150 MG tablet   Oral   Take by mouth at bedtime.         . Vilazodone HCl (VIIBRYD) 40 MG TABS   Oral   Take 40 mg by mouth daily.         . vitamin B-12 (CYANOCOBALAMIN) 1000 MCG tablet   Oral   Take 1,000 mcg  by mouth daily.            Review of Systems: Unable to accurately obtain since patient is having rapid breathing stating that she cannot get a breath.  Allergies:  Codeine; Latex; Morphine and related; Tape; and Nsaids  Physical Examination:  VS: BP 114/62 mmHg  Pulse 87  SpO2 98%  General Appearance: acute respiratory distress with tachypnea HEENT: PERRLA, EOM intact, no ptosis, no other lesions noticed Pulmonary:Exam: rapid shallow breathing, tachypnea, decreased breath sounds at the bilateral bases, poor airway movement. Cardiovascular:@ Exam:  Normal S1,S2.  No m/r/g.     Abdomen:Exam: Benign, Soft, non-tender, No masses  Skin:   warm, no rashes, no ecchymosis  Extremities: normal, no cyanosis, clubbing, no edema, warm with normal capillary refill.   Labs  results:  BMP No results found for: NA, K, CL, CO2, GLUCOSE, BUN, CREATININE   CBC No flowsheet data found.   Assessment and Plan: No problem-specific assessment & plan notes found for this encounter.   Updated Medication List Outpatient Encounter Prescriptions as of 05/04/2014  Medication Sig  . albuterol (PROAIR HFA) 108 (90 BASE) MCG/ACT inhaler Inhale 90 mcg into the lungs as needed.  Marland Kitchen albuterol (PROVENTIL) (2.5 MG/3ML) 0.083% nebulizer solution Take 2.5 mg by nebulization as needed.  . ALPRAZolam (XANAX) 0.5 MG tablet Take 1 mg by mouth 3 (three) times daily as needed.   . Ascorbic Acid (VITAMIN C) 1000 MG tablet Take 1,000 mg by mouth 2 (two) times daily.  . benzonatate (TESSALON) 200 MG capsule Take 1 capsule (200 mg total) by mouth 3 (three) times daily as needed for cough.  . butalbital-acetaminophen-caffeine (FIORICET WITH CODEINE) 50-325-40-30 MG per capsule Take 1 capsule by mouth 2 (two) times daily.  . carisoprodol (SOMA) 250 MG tablet Take 250 mg by mouth 2 (two) times daily.  . carisoprodol (SOMA) 350 MG tablet Take 250 mg by mouth 4 (four) times daily as needed. HAS NOT HAD RX IN 3 MONTH  . celecoxib (CELEBREX) 200 MG capsule Take 200 mg by mouth 2 (two) times daily.  . cetirizine (ZYRTEC) 10 MG tablet Take 10 mg by mouth as needed.  . diphenhydrAMINE (BENADRYL) 25 MG tablet Take 25 mg by mouth daily.  Marland Kitchen docusate sodium (COLACE) 100 MG capsule Take 100 mg by mouth as directed.  . DULoxetine (CYMBALTA) 20 MG capsule Take 20 mg by mouth 3 (three) times daily.  Marland Kitchen estradiol (ESTRACE) 2 MG tablet Take 2 mg by mouth daily.  . fluticasone (FLONASE) 50 MCG/ACT nasal spray Place 1 spray into both nostrils as needed.   . furosemide (LASIX) 20 MG tablet Take 40 mg by mouth daily.   Marland Kitchen gabapentin (NEURONTIN) 300 MG capsule Take 300 mg by mouth 3 (three) times daily.  . hydrocortisone (ANUSOL-HC) 2.5 % rectal cream Place 1 application rectally daily.  . insulin aspart (NOVOLOG  FLEXPEN) 100 UNIT/ML FlexPen Inject 15 Units into the skin 3 (three) times daily.  . insulin glargine (LANTUS) 100 UNIT/ML injection Inject 20 Units into the skin daily.  Marland Kitchen losartan (COZAAR) 100 MG tablet Take 100 mg by mouth daily.  . Magnesium (M2 MAGNESIUM) 100 MG CAPS Take 500 mg by mouth as needed.   . meclizine (ANTIVERT) 25 MG tablet Take 25 mg by mouth 3 (three) times daily as needed.  . Montelukast Sodium (SINGULAIR PO) Take by mouth daily.  . Naproxen Sodium (ALEVE) 220 MG CAPS Take by mouth as needed.  Marland Kitchen olopatadine (PATANOL) 0.1 % ophthalmic solution  Apply 1 drop to eye 2 (two) times daily.  . promethazine (PHENERGAN) 25 MG tablet Take 25 mg by mouth every 6 (six) hours as needed.  Marland Kitchen SM MULTIPLE VITAMINS/IRON TABS Take 1 tablet by mouth daily.  . traZODone (DESYREL) 150 MG tablet Take by mouth at bedtime.  . Vilazodone HCl (VIIBRYD) 40 MG TABS Take 40 mg by mouth daily.  . vitamin B-12 (CYANOCOBALAMIN) 1000 MCG tablet Take 1,000 mcg by mouth daily.  . [EXPIRED] levalbuterol (XOPENEX) nebulizer solution 0.63 mg     Orders for this visit: No orders of the defined types were placed in this encounter.    Thank  you for the visitation and for allowing  [facility] to assist in the care of your patient. Our recommendations are noted above.  Please contact us if we can be of further service.  Vilinda Boehringer, MD [facility] Office Number: [phone]

## 2014-05-04 NOTE — Assessment & Plan Note (Signed)
Acute on chronic shortness of breath. Given patient's respiratory distress and severity she was transferred to emergency room for further cardiac and respiratory workup. Transported via EMS

## 2014-05-05 ENCOUNTER — Telehealth: Payer: Self-pay | Admitting: Cardiovascular Disease

## 2014-05-05 NOTE — Telephone Encounter (Signed)
Pt c/o of Chest Pain: STAT if CP now or developed within 24 hours  1. Are you having CP right now? No Per Patient this is most intense cp she has ever experienced.  Patient has had weakness today.  Resting as advised.  Patient has panic disorder and this is a different kind of pain.     2. Are you experiencing any other symptoms (ex. SOB, nausea, vomiting, sweating)?   Sob nausea sweating   3. How long have you been experiencing CP?  3/9   4. Is your CP continuous or coming and going? SOB comes and go     5. Have you taken Nitroglycerin? No  ?

## 2014-05-16 ENCOUNTER — Telehealth: Payer: Self-pay | Admitting: Internal Medicine

## 2014-05-16 NOTE — Telephone Encounter (Signed)
Called and spoke to pt. Informed pt of the recs per VS. Pt verbalized understanding and denied any further questions or concerns at this time.

## 2014-05-16 NOTE — Telephone Encounter (Signed)
Spoke with patient, states that she has been having chills and fever x 1 day. Patient has a fever of 103.1 right now.  Pt c/o SOB, cough with greenish-yellow mucus, chest tightness and pain in center of chest.  Pt feels she may be getting PNA Pt states that she had cxr 05/04/14 Pt states that she is too sick to go to ED or come in for OV(its too cold outside).  Pt very emotional over the phone.  Dr Halford Chessman, please advise as Dr Stevenson Clinch is not available today.   [facility] [place], [place] Allergies  Allergen Reactions  . Codeine Other (See Comments)    LBP  . Latex   . Morphine And Related Other (See Comments)    LBP  . Tape   . Nsaids Rash

## 2014-05-16 NOTE — Telephone Encounter (Signed)
She needs to call 911 and go to hospital.

## 2014-05-18 ENCOUNTER — Ambulatory Visit (INDEPENDENT_AMBULATORY_CARE_PROVIDER_SITE_OTHER): Payer: Medicaid Other | Admitting: Cardiovascular Disease

## 2014-05-18 ENCOUNTER — Encounter: Payer: Self-pay | Admitting: Cardiovascular Disease

## 2014-05-18 VITALS — BP 128/72 | HR 83 | Ht 67.0 in | Wt 318.5 lb

## 2014-05-18 DIAGNOSIS — I5032 Chronic diastolic (congestive) heart failure: Secondary | ICD-10-CM | POA: Insufficient documentation

## 2014-05-18 DIAGNOSIS — R011 Cardiac murmur, unspecified: Secondary | ICD-10-CM

## 2014-05-18 DIAGNOSIS — R0602 Shortness of breath: Secondary | ICD-10-CM | POA: Diagnosis not present

## 2014-05-18 DIAGNOSIS — R0789 Other chest pain: Secondary | ICD-10-CM | POA: Diagnosis not present

## 2014-05-18 DIAGNOSIS — I10 Essential (primary) hypertension: Secondary | ICD-10-CM

## 2014-05-18 NOTE — Assessment & Plan Note (Signed)
Currently on Lasix daily. Likely secondary to chronic diastolic CHF. Echocardiogram pending to help guide diuresis.

## 2014-05-18 NOTE — Assessment & Plan Note (Signed)
Shortness of breath likely multifactorial including obesity, deconditioning, reactive airway disease. Echocardiogram pending to rule out structural cardiac issues or pulmonary hypertension. No notable cardiac findings on recent CT scan

## 2014-05-18 NOTE — Progress Notes (Signed)
Patient ID: Tara Levine, female    DOB: 05-26-1955, 59 y.o.   MRN: [id number]  HPI Comments: Tara Levine is a 59 year old woman, history of CML, who started treatments October 2011 on Tasigna, with weight gain, history of panic disorder, spinal stenosis, back and neck surgeries in 1999 and 2009 with rods placed, osteoarthritis, diabetes type 2, hypertension and hyperlipidemia.  cardiac catheterization in late 2000 on [place]  that showed no significant coronary artery disease. This was done in [place].  She presents today for recent chest tightness  She's had several trips to the emergency room for shortness of breath, chest tightness. Recently seen by pulmonary and while in the clinic was acutely short of breath. Chest CT scan recently showed no significant coronary artery disease, no PE, no aortic atherosclerosis.  She feels they're something going on with her heart. She is relatively inactive, weight continues to be an issue. She sees Dr. Stevenson Clinch of pulmonary.  EKG on today's visit shows normal sinus rhythm with rate 83 bpm, no significant ST or T-wave changes  Other past medical history Prior stress test 10/10/2011 showing no significant ischemia On prior visit was requesting pain medications for back pain Previously seen by psychiatry, needed Xanax for sleep, previously was on oxycodone. Prior notes detail she was not allowed to be seen in the pain clinic  Prior episodes of chest pain  seemed to come on at rest, nonexertional. She reports her mother had bypass surgery in her 86s.   Allergies  Allergen Reactions  . Codeine Other (See Comments)    LBP  . Latex   . Morphine And Related Other (See Comments)    LBP  . Tape   . Nsaids Rash    Outpatient Encounter Prescriptions as of 05/18/2014  Medication Sig  . albuterol (PROAIR HFA) 108 (90 BASE) MCG/ACT inhaler Inhale 90 mcg into the lungs as needed.  Marland Kitchen albuterol (PROVENTIL) (2.5 MG/3ML) 0.083% nebulizer solution Take 2.5 mg  by nebulization as needed.  . ALPRAZolam (XANAX) 0.5 MG tablet Take 1 mg by mouth 3 (three) times daily as needed.   . Ascorbic Acid (VITAMIN C) 1000 MG tablet Take 1,000 mg by mouth 2 (two) times daily.  . butalbital-acetaminophen-caffeine (FIORICET WITH CODEINE) 50-325-40-30 MG per capsule Take 1 capsule by mouth 2 (two) times daily.  . carisoprodol (SOMA) 250 MG tablet Take 250 mg by mouth 2 (two) times daily.  . carisoprodol (SOMA) 350 MG tablet Take 250 mg by mouth 4 (four) times daily as needed. HAS NOT HAD RX IN 3 MONTH  . celecoxib (CELEBREX) 200 MG capsule Take 200 mg by mouth 2 (two) times daily.  . cetirizine (ZYRTEC) 10 MG tablet Take 10 mg by mouth as needed.  . diphenhydrAMINE (BENADRYL) 25 MG tablet Take 25 mg by mouth daily.  Marland Kitchen docusate sodium (COLACE) 100 MG capsule Take 100 mg by mouth as directed.  . doxycycline (VIBRA-TABS) 100 MG tablet Take 100 mg by mouth 2 (two) times daily.   . DULoxetine (CYMBALTA) 20 MG capsule Take 20 mg by mouth 3 (three) times daily.  Marland Kitchen estradiol (ESTRACE) 2 MG tablet Take 2 mg by mouth daily.  . fluticasone (FLONASE) 50 MCG/ACT nasal spray Place 1 spray into both nostrils as needed.   . furosemide (LASIX) 20 MG tablet Take 40 mg by mouth daily.   Marland Kitchen gabapentin (NEURONTIN) 300 MG capsule Take 300 mg by mouth 3 (three) times daily.  . hydrocortisone (ANUSOL-HC) 2.5 % rectal cream Place 1 application  rectally daily.  . insulin aspart (NOVOLOG FLEXPEN) 100 UNIT/ML FlexPen Inject 15 Units into the skin 3 (three) times daily.  . insulin glargine (LANTUS) 100 UNIT/ML injection Inject 20 Units into the skin daily.  Marland Kitchen losartan (COZAAR) 100 MG tablet Take 100 mg by mouth daily.  . Magnesium (M2 MAGNESIUM) 100 MG CAPS Take 500 mg by mouth as needed.   . meclizine (ANTIVERT) 25 MG tablet Take 25 mg by mouth 3 (three) times daily as needed.  . Montelukast Sodium (SINGULAIR PO) Take by mouth daily.  . Naproxen Sodium (ALEVE) 220 MG CAPS Take by mouth as needed.   Marland Kitchen olopatadine (PATANOL) 0.1 % ophthalmic solution Apply 1 drop to eye 2 (two) times daily.  . promethazine (PHENERGAN) 25 MG tablet Take 25 mg by mouth every 6 (six) hours as needed.  Marland Kitchen SM MULTIPLE VITAMINS/IRON TABS Take 1 tablet by mouth daily.  . traZODone (DESYREL) 150 MG tablet Take by mouth at bedtime.  . Vilazodone HCl (VIIBRYD) 40 MG TABS Take 40 mg by mouth daily.  . vitamin B-12 (CYANOCOBALAMIN) 1000 MCG tablet Take 1,000 mcg by mouth daily.  . [DISCONTINUED] benzonatate (TESSALON) 200 MG capsule Take 1 capsule (200 mg total) by mouth 3 (three) times daily as needed for cough. (Patient not taking: Reported on 05/18/2014)    Past Medical History  Diagnosis Date  . Unspecified essential hypertension   . HLD (hyperlipidemia)   . DM type 2 (diabetes mellitus, type 2)   . Environmental allergies   . Osteoarthritis   . IDA (iron deficiency anemia)     chronic  . History of gastric ulcer   . Panic disorder   . PSS (progressive systemic sclerosis)   . CML (chronic myelocytic leukemia)   . Leukemia, chronic myeloid     Past Surgical History  Procedure Laterality Date  . Back and neck surgery  1999, 2009    had rods placed  . Partial hysterectomy      age 27, no cancer  . Carpal tunnel release    . Cardiac catheterization  [facility]) No blackages found.     Social History  reports that she has quit smoking. She has never used smokeless tobacco. She reports that she does not drink alcohol or use illicit drugs.  Family History family history includes Diabetes in her mother; Heart disease in her father and mother; Hypertension in her mother; Prostate cancer in her father.  Review of Systems  HENT: Negative.   Eyes: Negative.   Respiratory: Positive for chest tightness and shortness of breath.   Gastrointestinal: Negative.   Musculoskeletal: Positive for back pain, arthralgias and gait problem.  Skin: Negative.   Neurological: Negative.    Psychiatric/Behavioral: Negative.   All other systems reviewed and are negative.   BP 128/72 mmHg  Pulse 83  Ht 5\' 7"  (1.702 m)  Wt 318 lb 8 oz (144.471 kg)  BMI 49.87 kg/m2  Physical Exam  Constitutional: She is oriented to person, place, and time. She appears well-developed and well-nourished.  Obese  HENT:  Head: Normocephalic.  Nose: Nose normal.  Mouth/Throat: Oropharynx is clear and moist.  Eyes: Conjunctivae are normal. Pupils are equal, round, and reactive to light.  Neck: Normal range of motion. Neck supple. No JVD present.  Cardiovascular: Normal rate, regular rhythm, S1 normal, S2 normal and intact distal pulses.  Exam reveals no gallop and no friction rub.   Murmur heard.  Systolic murmur is present  with a grade of 1/6  Pulmonary/Chest: Effort normal and breath sounds normal. No respiratory distress. She has no wheezes. She has no rales. She exhibits no tenderness.  Abdominal: Soft. Bowel sounds are normal. She exhibits no distension. There is no tenderness.  Musculoskeletal: Normal range of motion. She exhibits no edema or tenderness.  Lymphadenopathy:    She has no cervical adenopathy.  Neurological: She is alert and oriented to person, place, and time. Coordination normal.  Skin: Skin is warm and dry. No rash noted. No erythema.  Psychiatric: She has a normal mood and affect. Her behavior is normal. Judgment and thought content normal.    Assessment and Plan  Nursing note and vitals reviewed.

## 2014-05-18 NOTE — Assessment & Plan Note (Signed)
We have encouraged continued exercise, careful diet management in an effort to lose weight. 

## 2014-05-18 NOTE — Assessment & Plan Note (Signed)
Recent episodes of chest tightness likely noncardiac, most likely secondary to respiratory issues, bronchospasm. She has a deep barking cough on today's visit, some wheezing Recent CT scan reviewed the report including the images. No aortic or coronary calcification noted. Echocardiogram has been ordered to rule out pulmonary hypertension, valvular heart disease. Very low-grade murmur likely from aortic valve sclerosis

## 2014-05-18 NOTE — Patient Instructions (Addendum)
You are doing well. No medication changes were made.  We will schedule you for an echocardiogram for shortness of breath, murmur and chest pain  Please call us if you have new issues that need to be addressed before your next appt.   We will call you with the results of the echocardiogram

## 2014-05-18 NOTE — Assessment & Plan Note (Signed)
Blood pressure is well controlled on today's visit. No changes made to the medications. 

## 2014-06-06 ENCOUNTER — Ambulatory Visit
Admit: 2014-06-06 | Disposition: A | Discharge status: Home / Self Care | Payer: Self-pay | Attending: Internal Medicine | Admitting: Internal Medicine

## 2014-06-09 ENCOUNTER — Other Ambulatory Visit: Payer: Medicaid Other
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
## 2014-06-14 NOTE — H&P (Signed)
PATIENT NAME:  Tara Levine, GERSTENBERGER MR#:  [id number] DATE OF BIRTH:  Feb 27, 1955  DATE OF ADMISSION:  10/09/2011  CHIEF COMPLAINT/REASON FOR ADMISSION: Progressive weight gain of greater than 30 pounds in the last few months, progressive leg swelling and dyspnea on exertion, left-sided chest pain. Has chronic myeloid leukemia, on Tasigna.  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 She also has noticed persistent left-sided chest pains and associated left arm discomfort on and off including currently and states that this was similar to symptoms that she has had in the past. She has some cardiac history and has been evaluated in [place] before and also more recently by Dr. Rockey Situ here in [place]. The patient has significant dyspnea on exertion. She also has poorly controlled pain issues with chronic back pain and neck pain getting worse preventing her from being physically active, states that no one is willing to prescribe her pain medication and has also been evaluated by a pain clinic. She has intermittent nausea, but no vomiting or diarrhea. No bleeding symptoms including blood in stools or urine. Denies any headaches, new focal weakness, falls, or loss of consciousness.   PAST MEDICAL/SURGICAL HISTORY: As in history of present illness above.   FAMILY HISTORY:  Remarkable for hypertension, heart disease, prostate cancer, and diabetes.   SOCIAL HISTORY: Remote history of smoking, quit in 1985. Denies alcohol or recreational drug usage.   ALLERGIES: Latex and tape.     HOME MEDICATIONS:  1. Magnesium oxide 400 mg twice a day. 2. PHOS-NaK one packet twice a day. 3. Ibuprofen as needed for pain. 4. Phenergan 1 tablet 25 mg every 6 hours p.r.n. for nausea and vomiting.  5. Senna 8.6 mg tablet p.r.n. for constipation. 6. Tasigna 300 mg every 12 hours (has been on hold for the last 3 to 4 days now). 7. Estradiol 2 mg tablet once daily.  8. Meclizine 25 mg three times daily p.r.n.  9. Patanol 0.1% solution one drop to each affected eye daily. 10. Flonase 50 mcg nasal spray one spray once daily.  11. Xanax 0.5 mg p.o. three times daily. 12. Allegra 180 mg p.o. once daily.  13. Benadryl 25 mg once daily p.r.n. for insomnia.  14. Cymbalta 60 mg daily.  15. Proctozone HC 2.5% cream rectally p.r.n.  16. B12 500 mcg daily.  17. Co-Q10 100 mg daily.  18. Multivitamin 1 daily.  19. Lantus insulin 20 units subcutaneous every 24 hours.  20. NovoLog insulin sliding scale.  21. Lasix 40 mg p.o. daily for swelling.  22. Prevacid 15 mg p.o. daily.   REVIEW OF SYSTEMS: CONSTITUTIONAL: As in history of present illness. No fevers or night sweats. HEENT: Denies headaches. Has intermittent dizziness on getting up and ambulating. No epistaxis, ear or jaw pain. No new sinus symptoms. CARDIAC: As in history of present illness. LUNGS: Denies any progressive cough, sputum, or hemoptysis. GASTROINTESTINAL: As in history of present illness.  GENITOURINARY: No dysuria or hematuria. Has good urine output. EXTREMITIES: Progressive swelling with no pain. SKIN: No new rashes or pruritus. HEMATOLOGIC: No obvious bleeding symptoms. NEUROLOGIC: No new focal weakness, seizures, or loss of consciousness. ENDOCRINE: No polyuria or polydipsia. Appetite is good.   PHYSICAL EXAMINATION:    GENERAL: The patient is weak and tired looking, otherwise alert and oriented x4, converses appropriately. No icterus or pallor.   VITAL SIGNS: Temperature 97.6, pulse 75, respiratory rate 20, blood pressure 134/83, and 99% on room air.   HEENT: Normocephalic, atraumatic. Extraocular movements intact. Sclera anicteric. No oral thrush.  NECK: Supple without lymphadenopathy.  HEART: S1 and S2, regular rate and rhythm, no murmurs.  LUNGS: Bilateral good air entry, decreased at bases. No crepitations or rhonchi noted.   ABDOMEN: Soft, nontender, no hepatosplenomegaly or masses palpable clinically. No ascites clinically.   EXTREMITIES: Bilateral 1 to 2+ pitting edema, no cyanosis or calf tenderness.   MUSCULOSKELETAL: No obvious joint deformity or swelling.  SKIN: No generalized rashes or major bruising.   NEUROLOGIC: Limited examination. Cranial nerves are intact. Moves all extremities spontaneously.   LABORATORY DATA: WBC 7000, hemoglobin 12.6, and platelets 276. Creatinine 1.06. Liver functions unremarkable, except albumin of 3.2. Glucose 161. Magnesium 1.7.   IMPRESSION AND PLAN:  1. Chronic myeloid leukemia in chronic phase - on Tasigna now which has been on hold for the last 3 to 4 days given acute sickness along with severe fluid retention and unintentional weight gain. These symptoms could be secondary to Tasigna side effect versus underlying cardiac issues like congestive heart failure. Chest x-ray today does not show pulmonary edema. We will hold Tasigna, repeat BCR-ABL1 will be drawn today to reassess CML and then treatment plan will be made in the near future.  2. Dyspnea on exertion, progressive swelling in lower extremities along with severe weight gain - we will admit to the hospital, given chest pain we will also rule out myocardial infarction, place on telemetry, cardiac consultation, cardiac enzymes x3 every 8 hours, and get EKG. We will consult cardiologist, Dr. Rockey Situ, 2-D  echocardiogram to evaluate for congestive heart failure. Continue home medications in the meantime.  3. Diabetes mellitus - we will continue Lantus insulin 20 units daily along with sliding scale insulin as indicated by Accu-Chek monitoring.  4. Monitor renal functions and electrolytes, daily weight, input and output record. We will increase dose of diuretic to Lasix 20 mg IV every 12 hours along with Aldactone 25 mg daily.  5. Severe low back pain and hip pain - we will get plain x-rays to evaluate, start on Norco 5/325 mg 1 to 2 tablets every six hours p.r.n. for pain.   The patient explained above, she is agreeable to this plan. ____________________________ Rhett Bannister. Ma Hillock, MD srp:slb D: 10/10/2011 10:05:19 ET T: 10/10/2011 10:29:51 ET JOB#: [id number]  cc: Kathrina Crosley R. Ma Hillock, MD, <Dictator> Alveta Heimlich MD ELECTRONICALLY SIGNED 10/10/2011 10:51

## 2014-06-14 NOTE — H&P (Signed)
PATIENT NAME:  Tara Levine, Tara Levine MR#:  [id number] DATE OF BIRTH:  June 07, 1955  DATE OF ADMISSION:  10/09/2011  CHIEF COMPLAINT/REASON FOR ADMISSION: Progressive weight gain of greater than 30 pounds in the last few months, leg swelling, dyspnea on exertion, left-sided chest pain, chronic myeloid leukemia on Tasigna.   HISTORY OF PRESENT ILLNESS: The patient is a 59 year old female with past medical history (Dictation Cut Off Here).    Please see repeat dictation note.  ____________________________ Rhett Bannister. Ma Hillock, MD srp:drc D: 10/10/2011 01:48:20 ET T: 10/10/2011 05:38:19 ET JOB#: [id number]  cc: Doug Bucklin R. Ma Hillock, MD, <Dictator> Alveta Heimlich MD ELECTRONICALLY SIGNED 10/10/2011 10:10

## 2014-06-17 NOTE — Consult Note (Signed)
Brief Consult Note: Diagnosis: Delirium due to multiple medical problems/ PTSD.   Patient was seen by consultant.   Consult note dictated.   Comments: Psychiatry: Patient seen. She is known to me as she is my outpatient as well. If I understand consult she is becoming agitated with attempted extubation.  1)As an outpt she takes xanax 1mg  tid and has for a while. She would likely toleratte higher doses than that if needed 2)She is not, at least now, having any signs of serotonin syndrome. In any case Dr Blocker stopped the Zyvox for unrelated reasons but still, I don't think you need to worry about serotonin syndrome. 3)Cymbalta withdrawl might be a little unpleasant but shouldn't cause delirium and shouldn't cause any hemodynamic instability. 4)Opening the Cymbalta capsule and pouring the contents down the tube with water should not do any damage to the medicine or change it's effectiveness. Even if you truly CRUSHED the little microspheres the worst you'd do is turn it from delayed release to immediate release, which shouldn't be a big deal. Patient was unresponsive to me tonight to both speach and moving her arms. I'm not sure I can be of any more help right now since she is not agitated. I'll follow up.  Electronic Signatures: Gonzella Lex (MD)  (Signed 09-Apr-14 20:13)  Authored: Brief Consult Note   Last Updated: 09-Apr-14 20:13 by Gonzella Lex (MD)

## 2014-06-17 NOTE — Consult Note (Signed)
PATIENT NAME:  Tara Levine, Tara Levine MR#:  [id number] DATE OF BIRTH:  [birth year]  DATE OF CONSULTATION:  06/03/2012  REFERRING PHYSICIAN:   CONSULTING PHYSICIAN:  Gonzella Lex, MD  IDENTIFYING INFORMATION AND REASON FOR CONSULTATION:  A 59 year old woman currently in the critical care unit for treatment of respiratory insufficiency.  Consultation questions whether there could be a problem with her Cymbalta that could somehow be leading to agitation.   HISTORY OF PRESENT ILLNESS:  Information obtained entirely from the chart and also from my previous knowledge of this patient who is one of my outpatients in the clinic.  The patient has been in the hospital now since March 28th.  She is currently in the critical care unit and is intubated.  Attempts have been made several times to extubate her, but it seems to have not gone well.  It looks like there was some concern that she was becoming agitated and anxious and that might have played a role in her failure to be able to breathe when she is extubated.  On my evaluation with the patient today, she appeared completely obtunded.  She did not respond to me in any way despite attempts to communicate with her verbally and to move her arms.  I looked her in the eye and spoke to her, asked her to blink her eyes for me.  She was not able to do any of this consistently.  I basically could not get any sense that she could hear me or is responding in any way.  It looks like attempts have been made to continue her on her Xanax and Cymbalta which are her outpatient psychiatric medicines.   PAST PSYCHIATRIC HISTORY:  The patient is known to me from the outpatient clinic, has a history of chronic anxiety and post-traumatic stress disorder.  Had been maintained on 60 mg total of Cymbalta and a milligram 3 times a day of Xanax reasonably well, although she still frequently complained of anxiety.   PAST MEDICAL HISTORY:  Morbid obesity, history of breathing problems.  History of  hypertension, hyperlipidemia, diabetes, allergies, iron deficiency anemia, gastric ulcers.   SOCIAL HISTORY:  The patient is married.  Disabled.   PSYCHIATRIC MEDICINES:  She normally takes Xanax 1 mg 3 times a day and Cymbalta 20 mg 3 times a day.  She has been compliant with her medicines.  She frequently had wanted to increase the dose of benzodiazepines, but I do not have any evidence that she had been misusing it or taking excessive amounts.   MENTAL STATUS EXAMINATION:  As noted, she is completely unresponsive to me today.  Mental status is pretty much blank.   ASSESSMENT:  I am not sure that I can be a great deal of help with this, although I can say that from my exam I do not see her having any signs of serotonin syndrome.  There is no sign of rigidity or spontaneous clonus or shaking.  She is not having spiking fevers or wild swings in blood pressure.  She just does not look like someone who is having serotonin syndrome.  Additionally, I would say that if there was concern about serotonin reuptake inhibitor withdrawal with the Cymbalta that that should not account for agitation or delirium.  It is usually if anything mild to moderately uncomfortable at worst.  I do not think however we need to worry about that because it looks like the Cymbalta has been administered correctly.  I know that the company says that  you are not supposed to open the capsule and not supposed to crush it.  In actuality, there is no harm to the medicine's function if you simply open the capsule and dump out the little pellets inside of it.  The nurses tell me that that is how they have been administering it which should not change anything about the medicine.  Even if they were crushing the medicine completely it would not change the duloxetine itself except to make it immediate release instead of delayed release.   TREATMENT PLAN:  I am sorry, I do not think I can be of much further help except to say that it is probably  best to continue her on the Xanax and Cymbalta.  If needed, higher doses of benzodiazepines would certainly be tolerated by her.  No further psychiatric input at this point.   DIAGNOSIS, PRINCIPAL AND PRIMARY:  AXIS I:  History of post-traumatic stress disorder in the past.  AXIS II:  Deferred.  AXIS III:  Multiple medical problems as obvious.  AXIS IV:  Moderate to severe currently.  AXIS V:  Functioning at time of evaluation 10.    ____________________________ Gonzella Lex, MD jtc:ea D: 06/03/2012 23:49:00 ET T: 06/04/2012 06:03:29 ET JOB#: [id number]  cc: Gonzella Lex, MD, <Dictator> Gonzella Lex MD ELECTRONICALLY SIGNED 06/07/2012 11:28

## 2014-06-17 NOTE — Consult Note (Signed)
ONCOLOGY followup note - patient continues on mech ventilation.No fever.  No obvious bleeding issues.sedated and unresponsive, on mechanical ventilation          vitals-  97.7, 56, 21, 135/51, 96%          lungs - b/l diminished breath sounds overall, no rhonchi          abd - soft          extremities - edema + hemoglobin 8.9, WBC 11800, ANC 16109, platelets 279, creatinine 0.87. Recent Ferritin 88, serum iron low at 24, iron saturation low at 8%, TIBC 295. BCR-ABL study on 05/25/12 shows b3a2 down to 0.004% (was 0.111% in Nov 2013), otherwise b2a2 and e1a2 remain undetectable. with known history of chronic phase CML (chronic myeloid leukemia) on Tasigna which has been on hold at this time given acute sickness, now status post resuscitation and continues on mechanical ventilation alongwith pressor support in CCU.  Agree with ongoing management, continue broad-spectrum antibiotic coverage.  CBC shows normal WBC count, ANC is also normal. Anemia is slightly better, has received one dose of IV Feraheme. Monitor for bleeding, check stool Hemoccult when she has a bowel movement.  Repeat BCR-ABL study on 05/07/12 n 05/25/12 shows b3a2 down to 0.004% (was 0.111% in Nov 2013) indicative of continued good response to Tasigna. Once she gets over acute illness, will need to resume CML treatment based upon her condition and co-morbidities at that time. Tried to call and update her husband Mr. Zigmund Daniel today but unable to reach him. Continue to monitor CBC, will continue to follow intermittently for oncology issues.  Electronic Signatures: Jonn Shingles (MD)  (Signed on 04-Apr-14 12:51)  Authored  Last Updated: 04-Apr-14 12:51 by Jonn Shingles (MD)

## 2014-06-17 NOTE — Consult Note (Signed)
ONCOLOGY followup note - patient is extubated, still weak, on [place] O2. Denies pain issues. No fever.  No obvious bleeding issues.weak, NAD, on [place] O2          vitals-  97.9, 72, 26, 113/66, 95% on 2L O2          lungs - b/l diminished breath sounds overall, no rhonchi          abd - soft, NT          extremities - edema + hemoglobin 10.3, WBC 13100, ANC 63845, platelets 263, creatinine 0.84. Recent Ferritin 88, serum iron low at 24, iron saturation low at 8%, TIBC 295. BCR-ABL study on 05/25/12 shows b3a2 down to 0.004% (was 0.111% in Nov 2013), otherwise b2a2 and e1a2 remain undetectable. with known history of chronic phase CML (chronic myeloid leukemia) on Tasigna which has been on hold at this time given acute sickness, now status post resuscitation and status post mechanical ventilation. Agree with ongoing management. CBC shows only mild neutrophilic leucocytosis. Anemia is steady, has received one dose of IV Feraheme. Monitor for bleeding. Repeat BCR-ABL study on 05/07/12 shows b3a2 down to 0.004% (was 0.111% in Nov 2013) indicative of continued good response to Tasigna. Will continue to follow and consider resuming Tasigna (likely at 50% of prior dose and monitor for side effects) in the next few days if er condition conitnues to improve. Continue to monitor CBC, will continue to follow intermittently for oncology issues.  Electronic Signatures: Jonn Shingles (MD)  (Signed on 11-Apr-14 21:52)  Authored  Last Updated: 11-Apr-14 21:52 by Jonn Shingles (MD)

## 2014-06-17 NOTE — Consult Note (Signed)
PATIENT NAME:  Tara Levine, FOOS MR#:  [id number] DATE OF BIRTH:  15-Oct-1955  PULMONARY/CRITICAL CARE CONSULTATION  CONSULTING PHYSICIAN:  Allyne Gee, MD  REASON FOR CONSULTATION:   Acute respiratory failure requiring intubation.  HISTORY OF PRESENT ILLNESS: A 59 year old female who has got morbid obesity, hypertension, chronic pain syndrome, CML, presented to the hospital with fevers and chills and abdominal pain as well as vomiting. She was admitted down on the first floor, apparently had been complaining about having severe pain and then she had some episodes of vomiting. The patient appears to have aspirated and was found to be unresponsive. Code Blue was called. The patient was intubated. She is now on the ventilator and not responsive.   PAST MEDICAL HISTORY: Significant for hyperlipidemia, hypertension, type 2 diabetes, osteoarthritis, gastric ulcerations in the past, anxiety disorder.   PAST SURGICAL HISTORY: She has had back surgery.   FAMILY HISTORY: Significant for diabetes, hypertension, heart disease.   SOCIAL HISTORY: Positive for tobacco in the past.   ALLERGIES: TO LATEX AND TAPE.   MEDICATIONS: Medications are extensive and reviewed on the electronic chart.   REVIEW OF SYSTEMS: She is orally intubated and is not able to provide.   PHYSICAL EXAMINATION: GENERAL: At the time that she was seen, she was orally intubated on the ventilator. Sats were 100% on 100% FiO2. Blood pressure was running about 157/89, pulse of 101. Temperature was 98.5.  NECK: Neck was short/supple. There was no JVD.  CHEST: Showed diminished breath sounds with some rhonchi bilaterally.  CARDIOVASCULAR: S1, S2 is normal. Regular rate and rhythm. No gallop or rub.  ABDOMEN: Soft and obese. Could not appreciate any organomegaly.  EXTREMITIES: Showing some edema. Pulses are equal.  NEUROLOGIC: She was unresponsive.  SKIN: Without any acute rashes.  MUSCULOSKELETAL: Without any active synovitis.    LABS: Were reviewed on the chart.   IMPRESSIONS: 1.  Acute on chronic respiratory failure, likely aspiration pneumonitis.  2.  Obesity hypoventilation syndrome.   PLAN: She is now orally intubated. Will get central access. Dr. Laurin Coder will perform the central access. Will keep her sedated. Will check repeat ABGs. We will continue following. Followup chest x-ray. May need antibiotics with aspiration event. Will make further recommendations as necessary.      ____________________________ Allyne Gee, MD sak:dm D: 05/24/2012 10:34:00 ET T: 05/24/2012 11:12:53 ET JOB#: [id number]  cc: Allyne Gee, MD, <Dictator> Allyne Gee MD ELECTRONICALLY SIGNED 07/13/2012 23:18

## 2014-06-17 NOTE — Consult Note (Signed)
Psychiatry: Patient seen. Per husband and also by my observation patient is still intermittantly delirious and confused. Multiple reasons. Extended hospital stay, spell of hypoxia, steroids all possible. Reassured pt and husband that long term this should clear up but for nowwill add v small dose risperdal 0.5mg  bid for delirium. Will follow.  Electronic Signatures: Clapacs, Madie Reno (MD)  (Signed on 15-Apr-14 17:24)  Authored  Last Updated: 15-Apr-14 17:24 by Gonzella Lex (MD)

## 2014-06-17 NOTE — Consult Note (Signed)
ONCOLOGY followup - patient had hypoxic respiratory failure, cardiac arrest and underwent resuscitation and is currently on mechanical ventilation and pressor support in CCU.fever +.  No obvious bleeding issues.sedated and unresponsive, on mechanical ventilation          vitals-  98.7, 90, 17, 141/47, 90%          lungs - b/l diminished breath sounds overall, no rhonchi          abd - soft          extremities - edema presenthemoglobin Is 8.0, WBC 9800, ANC 1800, platelets 212, creatinine 0.7.  Ferritin 88, serum iron low at 24, iron saturation low at 8%, TIBC 295. with known history of chronic phase CML (chronic myeloid leukemia) on Tasigna which has been on hold at this time given acute sickness, now status post resuscitation and mechanical ventilation alongwith pressor support in CCU.  Agree with ongoing management, continue broad-spectrum antibiotic coverage.  CBC shows normal WBC count, ANC is also normal.  Hemoglobin has dropped, could be secondary to hydration.  Iron indices are consistent with iron deficiency with iron saturation very low at 8%, agree with giving IV Feraheme to see if anemia improves quicker.  Monitor for bleeding, check stool Hemoccult when she has a bowel movement.  Repeat BCR-ABL study has been sent on March 31 and is pending at this time.  Continue to monitor CBC, will continue to follow intermittently for oncology issues.    Electronic Signatures: Jonn Shingles (MD)  (Signed on 01-Apr-14 17:07)  Authored  Last Updated: 01-Apr-14 17:07 by Jonn Shingles (MD)

## 2014-06-17 NOTE — Discharge Summary (Signed)
PATIENT NAME:  Tara Levine, HANNIBAL MR#:  [id number] DATE OF BIRTH:  [birth year]  DATE OF ADMISSION:  05/22/2012 DATE OF DISCHARGE: 06/10/2012    Addendum  PRIMARY CARE PHYSICIAN: Heinz Knuckles. Blocker, MD  DISCHARGE DIAGNOSES:  1. Acute diverticulitis.  2. Acute respiratory failure.  3. Hypotension.  4. Sepsis.  5. Cardiac arrest.  6. Anxiety.  7. Acute renal failure. 8. Morbid obesity.  9. Obstructive sleep apnea. 10. History of leukemia.  PROCEDURES: Intubation and ventilation.   CONDITION: Stable.   CODE STATUS: Full code.   HOME MEDICATIONS: Please refer to the medication reconciliation list in [facility] discharge instructions.   DIET: Low sodium, low fat, low cholesterol diet.   ACTIVITY: As tolerated.   FOLLOWUP CARE: Follow up with PCP within 1 to 2 weeks.   This is an addendum. For detailed discharge summary, please refer to the discharge summary dictated by Dr. Manuella Ghazi and Dr. Laurin Coder. The patient was successfully extubated on April 11th. The patient has been on oxygen by nasal cannula since that time. The patient has no shortness of breath, no cough or sputum, but has generalized weakness. Physical therapy suggested that the patient needs subacute rehab. The patient's diarrhea has improved.  She is clinically stable and will be discharged to subacute rehab today. I discussed the patient's discharge plan with the patient and the patient's husband and the case manager.   TIME SPENT: About 46 minutes.   ____________________________ Demetrios Loll, MD qc:OSi D: 06/10/2012 15:17:20 ET T: 06/10/2012 15:25:55 ET JOB#: [id number]  cc: Demetrios Loll, MD, <Dictator> Demetrios Loll MD ELECTRONICALLY SIGNED 06/12/2012 10:03

## 2014-06-17 NOTE — H&P (Signed)
PATIENT NAME:  Tara Levine, Tara Levine MR#:  [id number] DATE OF BIRTH:  Jan 19, 1956  DATE OF ADMISSION:  05/22/2012  REFERRING PHYSICIAN:  Dr. Benjaman Lobe.   PRIMARY CARE PHYSICIAN:  Dr. Darl Householder at [facility]   PRIMARY ONCOLOGIST:  Dr. Leia Alf.   CHIEF COMPLAINT:  Multiple complaints including, chills, fever, muscle spasms, nausea, abdominal pain, vomiting.   HISTORY OF PRESENT ILLNESS:  This is a 59 year old female with significant past medical history of morbid obesity, hypertension, hyperlipidemia, type 2 diabetes on insulin, with chronic myelogenous leukemia, who presents with above complaints, the patient has been followed by Dr. Ma Hillock regularly where she is on Tasigna for her leukemia, the patient was recently seen by Dr. Vira Agar where she had EGD and colonoscopy done in February of this year, which were significant for diverticulosis and gastritis.  The patient presents with above complaints where she had CT abdomen and pelvis which did show evidence of wall thickening and inflammatory changes in the sigmoid colon consistent with acute diverticulitis.  The patient had significant leukocytosis of 20,000, and she had a low-grade temperature at 99.7, as well she reports a headache for the last few months as well, where she had been taking occasionally Aleve for that with some help, the patient denies any chest pain, any shortness of breath, any coffee-ground emesis, any bright red blood per rectum, any melena, hospitalist service were requested to admit the patient for further treatment of her acute diverticulitis, the patient had blood cultures sent in ED and was started on IV Zosyn.   PAST MEDICAL HISTORY: 1.  Hypertension.  2.  Hyperlipidemia.  3.  Type 2 diabetes.  4.  Allergies.   5.  Osteoarthritis.  6.  Chronic iron deficiency anemia.  7.  History of gastric ulcer in the past.  8.  Panic disorder.  9.  Progressive spinal stenosis.   PAST SURGICAL HISTORY:   1.  Back and neck surgery x 2 in 1999  and 2009 where she had rods placed.  2.  Partial hysterectomy at age 46.   FAMILY HISTORY:  Significant for diabetes, hypertension, heart disease, prostate cancer.   SOCIAL HISTORY:  Remote history of smoking, quit in 1985.  No alcohol or recreational drug use.   ALLERGIES:  LATEX AND TAPE.   HOME MEDICATIONS: 1.  Spironolactone 25 mg oral daily. 2.  Losartan 100 mg oral daily.  3.  Duloxetine 20 mg oral 3 times a day.  4.  Trazodone 150 mg at bedtime.  5.  Byetta subQ before breakfast and dinner.  6.  Lantus 20 units subQ at bedtime.  7.  NovoLog FlexPen as per sliding scale.  8.  Alprazolam 1 mg oral 3 times a day.  9.  Tasigna 200 mg oral 2 capsules every 12 hours.  10.  Norvasc 10 mg oral daily.  11.  Lasix 20 mg oral daily.  12.  Magnesium oxide 400 mg oral 2 times a day.  13.  Neutra-Phos one packet 2 times a day.  14.  Fluticasone nasal two sprays daily.  15.  Sodium chloride nasal spray as needed.  16.  Co Q-10 100 mg oral capsule daily.  18.  Omeprazole 40 mg oral daily.  19.  Estradiol 2 mg oral daily.  20.  Multivitamin 1 tablet oral daily.  21.  Vitamin B12 500 mcg oral daily.  22.  Vitamin C 500 mg oral daily.   REVIEW OF SYSTEMS: CONSTITUTIONAL:  The patient complains of low-grade temperature at home, chills, fatigue,  weakness.  EYES:  Denies blurry vision, double vision, pain, inflammation, glaucoma.  EARS, NOSE, THROAT:  Denies tinnitus, ear pain, hearing loss, epistaxis or discharge.  RESPIRATORY:  Denies cough, wheezing, hemoptysis, dyspnea, painful respiratory or COPD.  CARDIOVASCULAR:  Denies chest pain, orthopnea, edema, arrhythmia, palpitations, syncope.  GASTROINTESTINAL:  Complains of nausea with some mild vomiting.  Mild abdominal pain.  Denies hematemesis, melena, rectal bleed, jaundice, constipation or diarrhea.  GENITOURINARY:  Denies dysuria, hematuria, renal colic.  ENDOCRINE:  Denies polyuria, polydipsia, heat or cold intolerance.  Has history of  diabetes.  HEMATOLOGY:  Has history of leukemia.  Denies easy bruising, bleeding diathesis.  INTEGUMENTARY:  Denies acne, rash or skin lesions.  MUSCULOSKELETAL:  Complains of back pain, neck pain and cramps.  Denies any gout.  NEUROLOGIC:  Denies any history of CVA, TIA or seizures, ataxia, vertigo, dysarthria, epilepsy.  PSYCHIATRIC:  Has anxiety with insomnia and nervousness and depressive disorder.  Denies any substance or alcohol abuse.   PHYSICAL EXAMINATION: VITAL SIGNS:  Temperature 99.7, pulse 84, respiratory rate 20, blood pressure 109/57, saturating 94% on room air.  GENERAL:  Morbidly obese female sleeping comfortably on the bed.  HEENT:  Head atraumatic, normocephalic.  Pupils equal, reactive to light.  Pink conjunctivae.  Anicteric sclerae.  Moist oral mucosa.  NECK:  Supple.  No thyromegaly.  No JVD.  CHEST:  Good air entry bilaterally.  No wheezing, rales, rhonchi.  CARDIOVASCULAR:  S1, S2 heard.  No rubs, murmur, gallops.  ABDOMEN:  Morbidly obese, mild diffuse tenderness, but very minimal, was most significant in the right lower quadrant, bowel sounds present.  No rebound, no guarding.  Could not appreciate any hepatosplenomegaly due to her significant obesity.  EXTREMITIES:  No edema.  No clubbing.  No cyanosis.  Dorsalis pedis pulses +2.  SKIN:  Normal skin turgor.  Warm and dry.  NEUROLOGIC:  Cranial nerves grossly intact.  Motor 5 out of 5.   PSYCHIATRIC:  The patient appears to be anxious.  Awake, alert x 3.  Intact judgment and insight.   PERTINENT LABORATORY DATA:  Glucose 135, BUN 19, creatinine 0.91, sodium 134, potassium 4.4, chloride 104, CO2 23, total protein 7.5, albumin 2.9, total bilirubin 0.2, alkaline phosphatase 73, AST 23, ALT 27.  White blood cells 20, hemoglobin 12.2, hematocrit 37.4, platelets 281.  EKG showing normal sinus rhythm with a QTc of 427 ms.   ASSESSMENT AND PLAN: 1.  Acute diverticulitis, the patient will have CT evidence of acute  diverticulitis in the sigmoid colon, she is known to have history of diverticulosis with the last colonoscopy by Dr. Vira Agar.  The patient will be kept on clear liquid diet, advance as tolerated, we will have her on as needed nausea and pain medications, the patient will be started on Zosyn.  Blood cultures were sent.   will follow cultures and adjust antibiotics as needed, if there is no symptomatic improvement we will need to consult surgery.  2.  Chronic myelogenous leukemia.  The patient is followed by Dr. Ma Hillock who will be consulted to see if patient should continue on Tasigna as an inpatient or not.  3.  Headache, appeared to be tension headache.  We will try Fioricet for the patient.  4.  Diabetes mellitus.  We will resume her on lower dose Lantus 14 units subQ at bedtime and insulin sliding scale.  5.  Hypertension.  Blood pressure is on the lower side so we will hold all diuresis and we will hold  lisinopril and Norvasc.  6.  History of gastroesophageal reflux and gastritis and gastric ulcer.  We will continue on PPI.  7.  Depression and panic disorder.  We will continue patient on home medication including alprazolam and Cymbalta.  8.  Deep vein thrombosis prophylaxis.  SubQ heparin.  9.  Gastrointestinal prophylaxis.  On PPI.   CODE STATUS:  FULL CODE.  TOTAL TIME SPENT ON ADMISSION AND PATIENT CARE:  60 minutes.    ____________________________ Albertine Patricia, MD dse:ea D: 05/22/2012 01:42:24 ET T: 05/22/2012 03:57:24 ET JOB#: [id number]  cc: Albertine Patricia, MD, <Dictator> DAWOOD Graciela Husbands MD ELECTRONICALLY SIGNED 05/22/2012 7:38

## 2014-06-17 NOTE — Consult Note (Signed)
ONCOLOGY followup - patient continues on mech ventilation.No fever.  No obvious bleeding issues.sedated and unresponsive, on mechanical ventilation          vitals-  98.9, 86, 21, 136/65, 97%          lungs - b/l diminished breath sounds overall, no rhonchi          abd - soft          extremities - edema + hemoglobin 10.7, WBC 11900, ANC 9100, platelets 300, creatinine 1.12. Recent Ferritin 88, serum iron low at 24, iron saturation low at 8%, TIBC 295. BCR-ABL study on 05/25/12 shows b3a2 down to 0.004% (was 0.111% in Nov 2013), otherwise b2a2 and e1a2 remain undetectable. with known history of chronic phase CML (chronic myeloid leukemia) on Tasigna which has been on hold at this time given acute sickness, now status post resuscitation and continues on mechanical ventilation alongwith pressor support in CCU.  Agree with ongoing management, continue broad-spectrum antibiotic coverage.  CBC shows normal WBC count, ANC is also normal. Anemia is slightly better, has received one dose of IV Feraheme. Monitor for bleeding, check stool Hemoccult when she has a bowel movement.  Repeat BCR-ABL study on 05/07/12 shows b3a2 down to 0.004% (was 0.111% in Nov 2013) indicative of continued good response to Tasigna. Jeris Penta feasible, resume Tasigna, if needed may empty contents of the caspule in applesauce and administer. Again tried to call and update her husband Mr. Zigmund Daniel today but unable to reach him. Continue to monitor CBC, will continue to follow intermittently for oncology issues.  Electronic Signatures: Jonn Shingles (MD)  (Signed on 08-Apr-14 23:44)  Authored  Last Updated: 08-Apr-14 23:44 by Jonn Shingles (MD)

## 2014-06-17 NOTE — Consult Note (Signed)
PATIENT NAME:  Tara Levine, COULON MR#:  [id number] DATE OF BIRTH:  1955-11-11  DATE OF CONSULTATION:  06/03/2012  REFERRING PHYSICIAN:  Dr. Manuella Ghazi CONSULTING PHYSICIAN:  Heinz Knuckles. Wilder Amodei, MD  REASON FOR CONSULT: Assistance with antibiotics.   HISTORY OF PRESENT ILLNESS: The patient is a 59 year old white female with a past history significant for diabetes, CML and diverticulosis who was admitted on March 28th with fevers, chills, abdominal pain, nausea and vomiting. The patient was diagnosed with diverticulitis based on a CT scan and admitted to the hospital. She was initially treated with Zosyn. She was also given narcotic pain medicines. The patient developed respiratory arrest and had approximately 5 minutes of bradycardia and asystole. With CPR and epinephrine, she returned to normal sinus rhythm.  Linezolid was added to her regimen due to concerns for aspiration. She continued on both antibiotics until April 6th when the Zosyn was discontinued. The linezolid was stopped today. Sputum cultures have been negative. The patient is intubated and unresponsive and unable to provide any history.  ALLERGIES: LATEX AND TAPE.   PAST MEDICAL HISTORY: 1. CML.  2. Diabetes.  3. Hypertension.  4. Hypercholesterolemia.  5. Osteoarthritis.  6. Gastric ulcer.  7. Diverticulosis.  8. Panic disorder.  9. Spinal stenosis, status post back and neck surgery with rod placement.  10. Status post partial hysterectomy.   FAMILY HISTORY: Positive for diabetes, hypertension, coronary artery disease and prostate cancer.   SOCIAL HISTORY: She is a prior smoker, having quit in the 1980s. She does not drink. She does not have any injecting drug use history per the [facility] and [facility]. REVIEW OF SYSTEMS: Unable to obtain from the patient due to current clinical condition.   PHYSICAL EXAMINATION: VITAL SIGNS: T-max of 100.2, T-current of 99.5, pulse of 106, blood pressure 163/67, 97% on the ventilator. Vent settings include assist  control with a rate of 22, spontaneous rate of zero, tidal volume of 490, minute ventilation of 10.8, FiO2 of 35%, PEEP of 8.  GENERAL: Obese 59 year old female, intubated and on the ventilator, appearing acutely ill.  HEENT: Normocephalic. Pupils are equal and reactive to light. Extraocular motion unable to be assessed.  Sclerae, conjunctiva and lids are without evidence for emboli or petechiae. Oropharynx:  The patient is orally intubated with an OG tube in place. No further exam was possible.  NECK: Midline trachea. No lymphadenopathy. No thyromegaly.  CHEST: Clear to auscultation bilaterally with good air movement. No focal consolidation. CARDIAC:  Distant S1, S2. Regular rate and rhythm without murmur, rub or gallop.  ABDOMEN: Soft and nondistended. No hepatosplenomegaly was appreciated. No hernia is noted.  EXTREMITIES: No evidence for tenosynovitis.  SKIN: No rashes. No stigmata of endocarditis, specifically, no Janeway lesions or Osler nodes.  NEUROLOGIC: The patient was unresponsive on the ventilator.   LABORATORY AND RADIOLOGICAL DATA:  BUN of 51, creatinine 1.12, bicarbonate 30, anion gap of 7. LFTs were unremarkable earlier in her hospital course. White count yesterday was 11.9 with a hemoglobin 10.7, platelet count of 300, ANC of 9.1. White count on admission was 20, and the next day it came down to 8.9 and has been fluctuating between normal and slightly elevated during the rest of her hospitalization. Blood cultures from admission show no growth. Sputum cultures on 2 occasions have shown normal flora.   A CT scan of the abdomen and pelvis on admission showed right lung atelectasis and mild sigmoid diverticulitis. MRI of the surgical spine showed high-grade stenosis at C6-C7 with no areas of  neuroforaminal narrowing and exit nerve root compromise. There was metallic artifact as well. Chest x-ray from admission showed no acute infiltrates. Chest x-ray following her intubation showed  bilateral perihilar densities. Chest x-ray from today showed bilateral hypoventilation and prominence of the central lung markings with possible bilateral atelectasis and/or infiltrate.   IMPRESSION: A 59 year old female with a history of diabetes, CML, and diverticulosis who was admitted with diverticulitis and whose course has been complicated by respiratory failure and aspiration.  RECOMMENDATIONS: 1. Sputum cultures have been negative twice. She had aspiration which often causes a chemical pneumonitis. There is not a need to treat chemical aspiration. Her chest x-rays have not shown an obvious focal infiltrate.  2. She was receiving broad-spectrum therapy for diverticulosis since admission. This was stopped several days ago. She has also been on linezolid, which was stopped today.  3. Her white count was high on admission and has come down. Given her CML, her white count could be misleading indicator of inflammation.  4. Would recommend no antibiotics at this point. We will continue vent support. She is not requiring any pressors.   This is a high-level infectious disease consult.   TIME SPENT: Approximately 40 minutes were spent providing critical care to this patient.   Thank you very much for involving me in this patient's care.   ____________________________ Heinz Knuckles. Brennan Karam, MD meb:cb D: 06/03/2012 16:57:47 ET T: 06/03/2012 21:16:39 ET JOB#: [id number]  cc: Heinz Knuckles. Lexani Corona, MD, <Dictator> Avani Sensabaugh E Danaria Larsen MD ELECTRONICALLY SIGNED 06/04/2012 15:18

## 2014-06-17 NOTE — Consult Note (Signed)
General Aspect 59 year old female with h/o history of morbid obesity, hypertension, hyperlipidemia, type 2 diabetes on insulin, with chronic myelogenous leukemia, diverticulosis and gastritis, presenting with multiple complaints including, chills, fever, muscle spasms, nausea, abdominal pain, vomiting. Cardioogy was consulted for bradycardia, respiratory distress/intubaqted, possible CHF.   CT abdomen and pelvis on arrival suggested acute diverticulitis.  The patient had significant leukocytosis of 20,000, and she had a low-grade temperature at 99.7, started on IV Zosyn. 3/30 rapid response called pt lethargic intubated. Hypotension, shock requiring levophed. self extunbated 05/27/12 and re-intubated as she was severely agitated despite high dose of meds, respiratory distress. She has contined to require high FIO2. Currently on 80%. pt started to get brady yesterday, improved rate this AM ocne she woke up, rate 100 bpm.  so far she is possitive 11L.   Present Illness . PAST SURGICAL HISTORY:   1.  Back and neck surgery x 2 in 1999 and 2009 where she had rods placed.  2.  Partial hysterectomy at age 8.   FAMILY HISTORY:  Significant for diabetes, hypertension, heart disease, prostate cancer.   SOCIAL HISTORY:  Remote history of smoking, quit in 1985.  No alcohol or recreational drug use.   Physical Exam:  GEN obese   HEENT red conjunctivae   NECK supple  No masses   RESP intubated, coarse BS   CARD Regular rate and rhythm   ABD soft   LYMPH negative neck   EXTR negative edema   SKIN normal to palpation   NEURO motor/sensory function intact   PSYCH sedated, intubated   Review of Systems:  ROS Pt not able to provide ROS   Medications/Allergies Reviewed Medications/Allergies reviewed     Intubated: patient is reintubated today by Dr. Laurin Coder using glydoscope. #7.5 Et tube placed at 23 cm at lip. End tidal Co2 monitor turned yellow. Chest x-ray pending. Bilateral breath  sound noted   CHF:    Hernia, Hiatal:    Esophagitis:    Ulcers, Gastric:    Other, see comments: plate in neck   Leukemia:    anxiety and depression:    panic disorder:    gastric ulcer:    Chronic IDA:    osteoarthritis:    spinal stenosis:    DM:    Hyperlipidemia:    HTN:    Carpal Tunnel Release:    rods in back and neck:    hysterectomy:        Admit Diagnosis:   562.11 ACUTE DIVERTICULITIS: Onset Date: 28-May-2012, Status: Active, Description: 562.11 ACUTE DIVERTICULITIS  Home Medications: Medication Instructions Status  estradiol 2 mg tablet 1 tab(s) orally once a day  Active  Vitamin B-12 500 mcg oral tablet 1 tab(s) orally once a day Active  Co Q-10 100 mg oral capsule 1 cap(s) orally once a day Active  magnesium oxide 400 mg tablet 1 tab(s) orally 2 times a day Active  Tasigna 200 mg oral capsule 2 cap(s) orally every 12 hours Active  PHOS-NaK 250 mg-280 mg-160 mg powder for reconstitution 1 PKT(S) orally 2 times a day Active  amlodipine 10 mg oral tablet 1 tab(s) orally once a day Active  alprazolam 1 mg oral tablet 1 tab(s) orally 3 times a day Active  duloxetine 20 mg oral delayed release capsule 1 cap(s) orally 3 times a day Active  Byetta Prefilled Pen 5 mcg/0.02 mL subcutaneous solution 0.02 milliliter(s) subcutaneous 2 times a day 1 before breakfast and dinner Active  sodium chloride nasal - nasal  spray 2 spray(s) nasal 4 times a day, As Needed for congestion Active  fluticasone nasal 50 mcg/inh nasal spray 2 spray(s) nasal once a day Active  furosemide 20 mg oral tablet 1 tab(s) orally 2 times a day Active  Lantus 100 units/mL subcutaneous solution 20 unit(s) subcutaneous once a day (at bedtime) Active  losartan 100 mg oral tablet 1 tab(s) orally once a day Active  NovoLog FlexPen 100 units/mL subcutaneous solution  subcutaneous , As Needed per sliding scale Active  multivitamin 1 tab(s) orally once a day Active  omeprazole 40 mg oral  delayed release capsule 1 cap(s) orally once a day Active  Pat[place] 0.2% ophthalmic solution 1 drop(s) to each eye once a day Active  Vitamin C 500 mg oral tablet 1 tab(s) orally once a day Active  spironolactone 25 mg oral tablet 1 tab(s) orally once a day Active  trazodone 150 mg oral tablet 1 tab(s) orally once a day (at bedtime) Active   Lab Results:  Routine Chem:  02-Apr-14 04:25   Magnesium, Serum 2.3 (1.8-2.4 THERAPEUTIC RANGE: 4-7 mg/dL TOXIC: > 10 mg/dL  -----------------------)  Phosphorus, Serum  1.5 (Result(s) reported on 27 May 2012 at 04:37AM.)  Result Comment LABS - This specimen was collected through an   - indwelling catheter or arterial line.  - A minimum of 27ms of blood was wasted prior    - to collecting the sample.  Interpret  - results with caution.  Result(s) reported on 27 May 2012 at 04:55AM.  Glucose, Serum  212  BUN 15  Creatinine (comp) 1.10  Sodium, Serum 139  Potassium, Serum 4.7  Chloride, Serum  108  CO2, Serum 23  Calcium (Total), Serum  7.4  Anion Gap 8  Osmolality (calc) 285  eGFR (African American) >60  eGFR (Non-African American)  56 (eGFR values <655mmin/1.73 m2 may be an indication of chronic kidney disease (CKD). Calculated eGFR is useful in patients with stable renal function. The eGFR calculation will not be reliable in acutely ill patients when serum creatinine is changing rapidly. It is not useful in  patients on dialysis. The eGFR calculation may not be applicable to patients at the low and high extremes of body sizes, pregnant women, and vegetarians.)  Triglycerides, Serum  253 (Result(s) reported on 27 May 2012 at 10:48AM.)  Routine Hem:  02-Apr-14 04:25   WBC (CBC) 8.4  RBC (CBC)  3.09  Hemoglobin (CBC)  8.7  Hematocrit (CBC)  27.4  Platelet Count (CBC) 245  MCV 89  MCH 28.2  MCHC  31.8  RDW 14.4  Neutrophil % 92.3  Lymphocyte % 5.4  Monocyte % 2.2  Eosinophil % 0.0  Basophil % 0.1  Neutrophil #  7.7   Lymphocyte #  0.5  Monocyte # 0.2  Eosinophil # 0.0  Basophil # 0.0   EKG:  Interpretation Tele strips shows NSR, bradycardia ast night until this AM   Radiology Results: XRay:    03-Apr-14 08:20, Chest Portable Single View  Chest Portable Single View   REASON FOR EXAM:    resp failure  COMMENTS:       PROCEDURE: DXR - DXR PORTABLE CHEST SINGLE VIEW  - May 28 2012  8:20AM     RESULT: The lungs are hypoinflated. The endotracheal tube tip lies at the   level of the inferior margin of the clavicular heads. The esophagogastric   tube tip cannot be discerned. The left subclavian venous catheter tip   lies at the junction  of the right and left brachiocephalic veins. The   left hemidiaphragm is obscured. The cardiac silhouette is mildly   enlarged. Thecentral pulmonary vascularity is prominent.    IMPRESSION:  Since yesterday's study there is developed increased density   at the left lung base suggesting atelectasis. There is persistent   increased density in the perihilar regions.   Dictation Site: 2        Verified By: DAVID A. Martinique, M.D., MD    Tape: Unknown  Latex: Unknown  Vital Signs/Nurse's Notes: **Vital Signs.:   03-Apr-14 06:00  Pulse Pulse 48  Respirations Respirations 21  Systolic BP Systolic BP 074  Diastolic BP (mmHg) Diastolic BP (mmHg) 51  Mean BP 75  Pulse Ox % Pulse Ox % 95  Oxygen Delivery Ventilator Assisted  Pulse Ox Heart Rate 12    Impression 59 year old female with h/o history of morbid obesity, hypertension, hyperlipidemia, type 2 diabetes on insulin, with chronic myelogenous leukemia, diverticulosis and gastritis, presenting with multiple complaints including, chills, fever, muscle spasms, nausea, abdominal pain, vomiting. Cardioogy was consulted for bradycardia, respiratory distress/intubated, possible CHF.   1) Bradycardia: rate improved this AM, woke up per nursing at 7:52, rate increased to >100 bpm, BP 600 systolic, some  aggitation Sedation meds increased. BP stable while brady overnight, no intervention needed.  2) Respiratory distress: Intubated, difficult wean. possible aspiration and possible narcotic effects ( althought pt did not responded to narcan ) she had large amount of vomit succtioned from ETT.  OSA /OHS as pt has been gaining weight rapidly as per Dr. Ma Hillock. as per husband pt is not compliant with cpap. echo pending. Agree with lasix. If RVSP elevated, this will help guide additional diuresis.   3)Cardiac arrest:  brady then asystole responded to epi one dose.  Likely from sepsis  no indication of cardiac event echo pending  4) *clinical sepsis SIRS -  diverticulitis, tachycardia, leukocytosis- present on admission on braod ABX wbc improved, BP stable   Electronic Signatures: Ida Rogue (MD)  (Signed 03-Apr-14 09:47)  Authored: General Aspect/Present Illness, History and Physical Exam, Review of System, Past Medical History, Health Issues, Home Medications, Labs, EKG , Radiology, Allergies, Vital Signs/Nurse's Notes, Impression/Plan   Last Updated: 03-Apr-14 09:47 by Ida Rogue (MD)

## 2014-06-17 NOTE — Consult Note (Signed)
PATIENT NAME:  Tara Levine, Tara Levine MR#:  [id number] DATE OF BIRTH:  1955-06-20  DATE OF CONSULTATION:  05/22/2012  REFERRING PHYSICIAN:  Albertine Patricia, MD CONSULTING PHYSICIAN:  Hajer Dwyer R. Ma Hillock, MD  REASON FOR CONSULTATION: CML, on Tasigna.   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), on Tasigna with most recent BCR-ABL study in November 2013 showing minimal residual disease with b3a2 transcript of 0.111%, was 60.171% at time of diagnosis in November 2011), hypertension, diabetes mellitus, hyperlipidemia, obesity, allergies, osteoarthritis, chronic iron deficiency anemia, history of gastric ulcer in the past, panic disorder, progressive spinal stenosis, back and neck surgery x 2, chronic pain syndrome, partial hysterectomy at age 37, who has been admitted to hospital with multiple complaints of persistent chills over the last few days, muscle spasms, abdominal pain, nausea and vomiting over the last 1 to 2 weeks, and fever on and off which she states that she has had since Thanksgiving. The patient also states that she has had some cough and sputum with occasional scanty hemoptysis. She denies any diarrhea or bloody stools. She has held [place] for the past few days since she has been having some nausea and vomiting. CBC as outpatient on March 14 showed normal WBC of 9100, ANC slightly elevated at 7200, hemoglobin 12.2, platelets 249. CBC upon admission here yesterday showed WBC count up to 20,000 with 18,000 neutrophils, hemoglobin 12.0, platelets 281. Blood cultures negative so far. Chest x-ray negative for acute changes of pneumonia. CT abdomen and pelvis with contrast was suggestive of right lung base atelectasis and mild sigmoid acute diverticulitis. Lipase is normal. Today states that the nausea and vomiting is better. She has chronic back pain and now follows at [facility] for management.   PAST MEDICAL AND SURGICAL HISTORY: As in HPI above.   FAMILY HISTORY: Noncontributory.   SOCIAL HISTORY: Denies smoking, alcohol or recreational drug usage. Ambulates slowly given her chronic back pain issues.   HOME MEDICATIONS: Losartan 100 mg daily, trazodone 150 mg at bedtime, duloxetine 20 mg t.i.d., spironolactone 25 mg daily, Byetta subcutaneous before breakfast and dinner, Lantus 20 units subcutaneous at bedtime, NovoLog Flex pen per sliding scale, Xanax 1 mg t.i.d., Tasigna 400 mg b.i.d., Norvasc 10 mg daily, Lasix 20 mg daily, magnesium oxide 400 mg b.i.d., Neutra-Phos 1 packet b.i.d., fluticasone nasal spray daily, sodium chloride nasal spray p.r.n., omeprazole 40 mg daily, multivitamin 1 daily, vitamin C 500 mg daily, vitamin B12 at 500 mcg daily, estradiol 2 mg daily, CoQ10 at 100 mg daily.   ALLERGIES: INCLUDE LATEX, TAPE.   REVIEW OF SYSTEMS:   CONSTITUTIONAL: As in HPI. Feels weak overall. Eating well, has gained weight.  HEENT: Denies headache or dizziness at rest. No epistaxis, ear or jaw pain.  CARDIAC: No angina, palpitation, orthopnea or PND.  LUNGS: As in HPI. Denies any dyspnea at rest. No new chest pain.  ABDOMEN: As in HPI.  GENITOURINARY: No dysuria or hematuria.  SKIN: No new rashes or pruritus.  MUSCULOSKELETAL: Chronic pain syndrome, chronic back pain.  HEMATOLOGIC: Denies bleeding symptoms, other than scanty occasional hemoptysis on coughing, which is better now.  NEUROLOGIC: Denies any recent focal weakness, seizures or loss of consciousness.  ENDOCRINE: No polyuria or polydipsia.   PHYSICAL EXAMINATION:  GENERAL: The patient is anxious, talkative, otherwise resting in bed, alert and oriented and in no acute distress, converses appropriately. No icterus. No pallor.  VITAL SIGNS: Temperature 97.4, pulse 86, respirations 18, blood pressure 108/67, O2 sat 92% on room air.  HEENT: Normocephalic, atraumatic. Extraocular movements intact.  Sclera anicteric. No oral thrush.  NECK: Negative for lymphadenopathy. CARDIOVASCULAR: S1, S2, regular rate and rhythm.  LUNGS: Bilateral good air entry, decreased at bases. No crepitations or rhonchi noted.  ABDOMEN: Soft, tenderness present in the epigastrium with no guarding or rigidity. No hepatosplenomegaly or masses palpable clinically. Bowel sounds present.  EXTREMITIES: Bilateral mild edema, no cyanosis.  SKIN: No generalized rashes or major bruising.  NEUROLOGIC: Limited exam. Cranial nerves intact. Moves all extremities spontaneously.  MUSCULOSKELETAL: No obvious joint redness or swelling.   LABORATORY DATA: Blood culture negative so far. WBC on March 27 was 20,000 with ANC of 18,000, hemoglobin 12.2, platelets 281, MCV 88. Creatinine 0.91, potassium 4.4, sodium 134, glucose 135. Liver functions unremarkable except albumin of 2.9, lipase 180.   IMPRESSION AND RECOMMENDATIONS: This is a 59 year old patient with history of chronic myeloid leukemia, chronic phase, with most recent BCR-ABL study in November 2013 showing minimal residual disease with b3a2 transcript of 0.111%. Otherwise, b2a2 and e1a2 transcripts were undetectable. Tasigna dose was increased to 400 mg twice daily at that time. Recent CBC done 2 weeks ago was unremarkable. Now she is admitted with chills, fevers intermittently, leukocytosis, tachycardia, and CT scan is suggestive of mild acute sigmoid diverticulitis. The patient also has ongoing cough and may have some bronchitis. Chest x-ray negative for pneumonia or other acute process. Tasigna has been held by patient for the last few days given ongoing intermittent nausea and vomiting. Continue to hold off on Tasigna until her acute issues resolve and gastrointestinal symptoms improve so that she can tolerate it. I have explained to the patient that it is important to resume it as soon as possible and she understands this. Otherwise agree with ongoing supportive treatment,  broad-spectrum antibiotic coverage and pain control. Will continue to follow intermittently as indicated during hospitalization. If discharged soon, the patient was advised to keep outpatient followups at [facility] same as previously scheduled. She is agreeable to this plan.   Thank you for the referral. Please feel free to contact me for additional questions.     ____________________________ Rhett Bannister Ma Hillock, MD srp:jm D: 05/22/2012 19:01:52 ET T: 05/22/2012 20:36:53 ET JOB#: [id number]  cc: Link Burgeson R. Ma Hillock, MD, <Dictator> Alveta Heimlich MD ELECTRONICALLY SIGNED 05/22/2012 23:49

## 2014-06-17 NOTE — Consult Note (Signed)
Impression:     59yo female w/ h/o DM, CML and diverticulosis admitted with diverticulitis whose course was complicated by respiratory failure and aspiration.     Sputum cultures have been negative twice.  She had aspiration which often causes a chemical pneumonitis.  There is not a need to treat chemical aspiration.    She was receiving broad spectrum therapy for diverticulitis since admission.  This was stopped several days ago.  She has also been on linezolid which was stopped today. Today is day 13 of therapy.      Her WBC was high on admission and has come down.  Given her CML, her WBC could be a misleading indicator of inflammation.    Would recommend no stopping antibiotics at this point.    Continue vent support.  Not requiring any pressors.    Electronic Signatures: Trejuan Matherne MPH, Heinz Knuckles (MD) (Signed on 09-Apr-14 16:41)  Authored   Last Updated: 09-Apr-14 16:57 by Quantavis Obryant MPH, Heinz Knuckles (MD)

## 2014-06-17 NOTE — Consult Note (Signed)
ONCOLOGY followup - patient is resting in bed, still weak.  Husband at bedside, states that she ate lunch better.  She is on nasal cannula oxygen. No fever.  No obvious bleeding issues.weak, NAD, on [place] O2          vitals -  99.4, 91, 17, 147/87, 97% on 2L O2          lungs - b/l diminished breath sounds overall, no rhonchi          abd - soft, NT          extremities - edema + hemoglobin 11.3, WBC 11300, ANC 8900, platelets 270, creatinine 0.63. Recent Ferritin 88, serum iron low at 24, iron saturation low at 8%, TIBC 295. BCR-ABL study on 05/25/12 shows b3a2 down to 0.004% (was 0.111% in Nov 2013), otherwise b2a2 and e1a2 remain undetectable. with known history of chronic phase CML (chronic myeloid leukemia) on Tasigna which has been on hold at this time given acute sickness, now status post resuscitation and status post mechanical ventilation. Agree with ongoing management. CBC shows only mild neutrophilic leucocytosis. Anemia is steady, has received one dose of IV Feraheme. Monitor for bleeding. Repeat BCR-ABL study on 05/07/12 shows b3a2 down to 0.004% (was 0.111% in Nov 2013) indicative of continued good response to Tasigna.  Patient is clinically improving slowly, he is now on nasal cannula oxygen,taking by mouth.  Have discussed with patient and her husband present that we could try starting back on Tasigna at lower dose, they are agreeable.  Will start on Tasigna tomorrow at low dose of 200 mg by mouth daily and monitor for side effects. Continue to monitor CBC, will continue to follow intermittently for oncology issues.  Electronic Signatures: Jonn Shingles (MD)  (Signed on 15-Apr-14 13:42)  Authored  Last Updated: 15-Apr-14 13:42 by Jonn Shingles (MD)

## 2014-06-30 ENCOUNTER — Ambulatory Visit (INDEPENDENT_AMBULATORY_CARE_PROVIDER_SITE_OTHER): Payer: Medicaid Other

## 2014-06-30 ENCOUNTER — Other Ambulatory Visit: Payer: Medicaid Other

## 2014-06-30 ENCOUNTER — Other Ambulatory Visit: Payer: Self-pay

## 2014-06-30 DIAGNOSIS — R0602 Shortness of breath: Secondary | ICD-10-CM | POA: Diagnosis not present

## 2014-06-30 DIAGNOSIS — R011 Cardiac murmur, unspecified: Secondary | ICD-10-CM | POA: Diagnosis not present

## 2014-06-30 DIAGNOSIS — R0789 Other chest pain: Secondary | ICD-10-CM

## 2014-07-06 ENCOUNTER — Telehealth: Payer: Self-pay

## 2014-07-06 NOTE — Telephone Encounter (Signed)
I spoke with the patient and results were given.

## 2014-07-06 NOTE — Telephone Encounter (Signed)
Pt returning call regarding echo results

## 2014-07-13 ENCOUNTER — Other Ambulatory Visit: Payer: Self-pay

## 2014-07-13 DIAGNOSIS — C9210 Chronic myeloid leukemia, BCR/ABL-positive, not having achieved remission: Secondary | ICD-10-CM

## 2014-07-14 ENCOUNTER — Other Ambulatory Visit: Payer: Self-pay | Admitting: *Deleted

## 2014-07-14 ENCOUNTER — Inpatient Hospital Stay: Payer: Medicaid Other

## 2014-07-14 ENCOUNTER — Inpatient Hospital Stay: Payer: Medicaid Other | Admitting: Internal Medicine

## 2014-07-14 DIAGNOSIS — C9210 Chronic myeloid leukemia, BCR/ABL-positive, not having achieved remission: Secondary | ICD-10-CM

## 2014-07-26 ENCOUNTER — Other Ambulatory Visit: Payer: Self-pay

## 2014-07-26 DIAGNOSIS — C9210 Chronic myeloid leukemia, BCR/ABL-positive, not having achieved remission: Secondary | ICD-10-CM

## 2014-07-28 ENCOUNTER — Inpatient Hospital Stay: Payer: Medicaid Other | Attending: Internal Medicine

## 2014-07-28 ENCOUNTER — Inpatient Hospital Stay (HOSPITAL_BASED_OUTPATIENT_CLINIC_OR_DEPARTMENT_OTHER): Payer: Medicaid Other | Admitting: Internal Medicine

## 2014-07-28 VITALS — BP 123/71 | HR 82 | Temp 96.9°F | Ht 67.0 in | Wt 321.0 lb

## 2014-07-28 DIAGNOSIS — R609 Edema, unspecified: Secondary | ICD-10-CM

## 2014-07-28 DIAGNOSIS — Z79899 Other long term (current) drug therapy: Secondary | ICD-10-CM

## 2014-07-28 DIAGNOSIS — R740 Nonspecific elevation of levels of transaminase and lactic acid dehydrogenase [LDH]: Secondary | ICD-10-CM

## 2014-07-28 DIAGNOSIS — C9290 Myeloid leukemia, unspecified, not having achieved remission: Secondary | ICD-10-CM

## 2014-07-28 DIAGNOSIS — C9210 Chronic myeloid leukemia, BCR/ABL-positive, not having achieved remission: Secondary | ICD-10-CM

## 2014-07-28 LAB — CBC WITH DIFFERENTIAL/PLATELET
BASOS PCT: 0 %
Basophils Absolute: 0 10*3/uL (ref 0–0.1)
EOS ABS: 0.1 10*3/uL (ref 0–0.7)
EOS PCT: 2 %
HCT: 36.1 % (ref 35.0–47.0)
Hemoglobin: 12.1 g/dL (ref 12.0–16.0)
LYMPHS PCT: 13 %
Lymphs Abs: 1 10*3/uL (ref 1.0–3.6)
MCH: 28.9 pg (ref 26.0–34.0)
MCHC: 33.5 g/dL (ref 32.0–36.0)
MCV: 86.2 fL (ref 80.0–100.0)
MONO ABS: 0.5 10*3/uL (ref 0.2–0.9)
MONOS PCT: 6 %
NEUTROS PCT: 79 %
Neutro Abs: 5.7 10*3/uL (ref 1.4–6.5)
Platelets: 209 10*3/uL (ref 150–440)
RBC: 4.19 MIL/uL (ref 3.80–5.20)
RDW: 13.7 % (ref 11.5–14.5)
WBC: 7.3 10*3/uL (ref 3.6–11.0)

## 2014-07-28 LAB — COMPREHENSIVE METABOLIC PANEL
ALBUMIN: 3.3 g/dL — AB (ref 3.5–5.0)
ALK PHOS: 55 U/L (ref 38–126)
ALT: 63 U/L — ABNORMAL HIGH (ref 14–54)
AST: 58 U/L — AB (ref 15–41)
Anion gap: 9 (ref 5–15)
BUN: 18 mg/dL (ref 6–20)
CO2: 28 mmol/L (ref 22–32)
Calcium: 9 mg/dL (ref 8.9–10.3)
Chloride: 98 mmol/L — ABNORMAL LOW (ref 101–111)
Creatinine, Ser: 0.9 mg/dL (ref 0.44–1.00)
GFR calc Af Amer: >60 mL/min (ref >60)
GFR calc non Af Amer: >60 mL/min (ref >60)
GLUCOSE: 174 mg/dL — AB (ref 65–99)
Potassium: 4.4 mmol/L (ref 3.5–5.1)
Sodium: 135 mmol/L (ref 135–145)
TOTAL PROTEIN: 6.8 g/dL (ref 6.5–8.1)
Total Bilirubin: 0.3 mg/dL (ref 0.3–1.2)

## 2014-07-28 LAB — PHOSPHORUS: Phosphorus: 2.6 mg/dL (ref 2.5–4.6)

## 2014-07-28 LAB — MAGNESIUM: MAGNESIUM: 1.9 mg/dL (ref 1.7–2.4)

## 2014-08-01 LAB — BCR-ABL1, CML/ALL, PCR, QUANT

## 2014-08-03 ENCOUNTER — Telehealth: Payer: Self-pay | Admitting: *Deleted

## 2014-08-03 NOTE — Telephone Encounter (Signed)
Called pt and left her a message that all her tests for CML-BCR tests were back to normal and she is doing well on bosulif and cont. Taking it and make sure she comes to all of her of already scheduled appt. And if she has questions she can call me back.

## 2014-08-06 NOTE — Progress Notes (Signed)
Media  [phone] [phone]     ID: Tara Levine OB: 09/25/1955  MR#: [id number]  BZJ#:[id number]  Patient Care Team: Valera [place], MD as PCP - General  CHIEF COMPLAINT/DIAGNOSIS:  Chronic myelogenous leukemia, chronic phase, diagnosed October 2011. Quantitative BCR-ABL in November 2011 -  b2a2 transcript 0.009%, b3a2 transcript  60.171%, e1a2 transcript 0.006%. On Tasigna since late October 2011. Changed to Bosulif end 2014/early 2015 due to progression on Tasigna.  May 2015 BCR transcripts undetectable including b3a2. Then dose of Bosulif was dropped to 200 mg daily due to abnormal LFTs. In Sept 2015, b3a2 increased to 0.052%. Patient advised to increase Bosulif to 300 mg daily but she took only 100 mg daily and on 03/02/14, the b3a2 further increased to 5.456%. Since then taking  Bosulif 300 mg daily.    Review of Systems:  Review of Systems:  Review of Systems   As in HPI above. In addition, currently no fevers or chills. No new headaches, visual symptoms or focal weakness.  No new mood disturbances. No earache or discharge. No sore throat or dysphagia. No hemoptysis or chest pain. No abdominal pain, constipation, diarrhea, dysuria or hematuria. No new bone pain. No skin rash or ongoing bleeding symptoms. No polyuria polydipsia. PS ECOG 1.  Allergies:  Tape: Unknown  Latex: Unknown  (Removed):  Smoking History: Smoking History Never Smoked.(1)  PFSH: Additional Past Medical and Surgical History: Past Medical History/Past Surgical History -      Family History -     HISTORY OF PRESENT ILLNESS:  Patient returns for continued oncology followup, she has missed a few scheduled appointment and lab monitoring. Last BCR-ABL study had shown increased b3a2 transcript to 5.456% and she was advised to increase Bosulif dose from 100 mg to 300 mg daily since LFT had normalized on January 6. States that she is taking Bosulif 300 mg daily  on a regular basis. She continues to have issues with weight gain but feels issue of fluid retention is overall better. No fevers, chills, or night sweats. Chronic back pain and arthritis are unchanged, no new bone pains. Eating well. No nausea, vomiting, or diarrhea. She has persistent generalized itching but no progressive rash.   REVIEW OF SYSTEMS:   ROS As in HPI above. In addition, no fever, chills or sweats. No new headaches or focal weakness.  No new mood disturbances. No  sore throat, cough, shortness of breath, sputum, hemoptysis or chest pain. No dizziness or palpitation. No abdominal pain, constipation, diarrhea, dysuria or hematuria. No new skin rash or bleeding symptoms. No new paresthesias in extremities. PS ECOG 1.  PAST MEDICAL HISTORY: Reviewed Past Medical History  Diagnosis Date  . Unspecified essential hypertension   . HLD (hyperlipidemia)   . DM type 2 (diabetes mellitus, type 2)   . Environmental allergies   . Osteoarthritis   . IDA (iron deficiency anemia)     chronic  . History of gastric ulcer   . Panic disorder   . PSS (progressive systemic sclerosis)   . CML (chronic myelocytic leukemia)   . Leukemia, chronic myeloid           Hypertension  Hyperlipidemia  Type II diabetes mellitus  Allergies  Osteoarthritis  Chronic iron deficiency anemia  History of gastric ulcer in the past  Panic disorder  Progressive spinal stenosis  Back and neck surgery x2 (1999, 2009 had rods placed)  Partial hysterectomy at age 62 (denies cancer)  PAST SURGICAL HISTORY:Reviewed  Past Surgical History  Procedure Laterality Date  . Back and neck surgery  1999, 2009    had rods placed  . Partial hysterectomy      age 67, no cancer  . Carpal tunnel release    . Cardiac catheterization  [facility]) No blackages found.     FAMILY HISTORY:Reviewed Family History  Problem Relation Age of Onset  . Diabetes Mother   . Hypertension Mother   . Heart  disease Mother   . Prostate cancer Father   . Heart disease Father   Remarkable for diabetes, hypertension, heart disease, prostate cancer.  Denies hematological disorders including leukemia.   ADVANCED DIRECTIVES:  Yes, educational materials given.  SOCIAL HISTORY: Reviewed History  Substance Use Topics  . Smoking status: Former Research scientist (life sciences)  . Smokeless tobacco: Never Used     Comment: quit 1985  . Alcohol Use: No  Remote history of smoking, quit in 1985.  Denies alcohol or recreational drug usage.   Allergies  Allergen Reactions  . Codeine Other (See Comments)    LBP  . Latex   . Morphine And Related Other (See Comments)    LBP  . Tape   . Nsaids Rash    Current Outpatient Prescriptions  Medication Sig Dispense Refill  . albuterol (PROAIR HFA) 108 (90 BASE) MCG/ACT inhaler Inhale 90 mcg into the lungs as needed.    Marland Kitchen albuterol (PROVENTIL) (2.5 MG/3ML) 0.083% nebulizer solution Take 2.5 mg by nebulization as needed.    . ALPRAZolam (XANAX) 0.5 MG tablet Take 1 mg by mouth 3 (three) times daily as needed.     . Ascorbic Acid (VITAMIN C) 1000 MG tablet Take 1,000 mg by mouth 2 (two) times daily.    . bosutinib (BOSULIF) 100 MG tablet Take 100 mg by mouth 3 (three) times daily. Take with food.    . butalbital-acetaminophen-caffeine (FIORICET WITH CODEINE) 50-325-40-30 MG per capsule Take 1 capsule by mouth 2 (two) times daily.    . carisoprodol (SOMA) 250 MG tablet Take 250 mg by mouth 2 (two) times daily.    . carisoprodol (SOMA) 350 MG tablet Take 250 mg by mouth 4 (four) times daily as needed. HAS NOT HAD RX IN 3 MONTH    . celecoxib (CELEBREX) 200 MG capsule Take 200 mg by mouth 2 (two) times daily.    . cetirizine (ZYRTEC) 10 MG tablet Take 10 mg by mouth as needed.    . diphenhydrAMINE (BENADRYL) 25 MG tablet Take 25 mg by mouth daily.    Marland Kitchen docusate sodium (COLACE) 100 MG capsule Take 100 mg by mouth as directed.    . DULoxetine (CYMBALTA) 20 MG capsule Take 20 mg by mouth 3  (three) times daily.    Marland Kitchen estradiol (ESTRACE) 2 MG tablet Take 2 mg by mouth daily.    . fluticasone (FLONASE) 50 MCG/ACT nasal spray Place 1 spray into both nostrils as needed.     . furosemide (LASIX) 20 MG tablet Take 40 mg by mouth daily.     Marland Kitchen gabapentin (NEURONTIN) 300 MG capsule Take 300 mg by mouth 3 (three) times daily.    . hydrocortisone (ANUSOL-HC) 2.5 % rectal cream Place 1 application rectally daily.    . insulin aspart (NOVOLOG FLEXPEN) 100 UNIT/ML FlexPen Inject 15 Units into the skin 3 (three) times daily.    . insulin glargine (LANTUS) 100 UNIT/ML injection Inject 20 Units into the skin daily.    Marland Kitchen losartan (COZAAR)  100 MG tablet Take 100 mg by mouth daily.    . Magnesium (M2 MAGNESIUM) 100 MG CAPS Take 500 mg by mouth as needed.     . meclizine (ANTIVERT) 25 MG tablet Take 25 mg by mouth 3 (three) times daily as needed.    . Montelukast Sodium (SINGULAIR PO) Take by mouth daily.    . Naproxen Sodium (ALEVE) 220 MG CAPS Take by mouth as needed.    Marland Kitchen olopatadine (PATANOL) 0.1 % ophthalmic solution Apply 1 drop to eye 2 (two) times daily.    . promethazine (PHENERGAN) 25 MG tablet Take 25 mg by mouth every 6 (six) hours as needed.    Marland Kitchen SM MULTIPLE VITAMINS/IRON TABS Take 1 tablet by mouth daily.    Marland Kitchen spironolactone (ALDACTONE) 25 MG tablet Take 25 mg by mouth daily.    . traZODone (DESYREL) 150 MG tablet Take by mouth at bedtime.    . Vilazodone HCl (VIIBRYD) 40 MG TABS Take 40 mg by mouth daily.    . vitamin B-12 (CYANOCOBALAMIN) 1000 MCG tablet Take 1,000 mcg by mouth daily.     No current facility-administered medications for this visit.    OBJECTIVE: Filed Vitals:   07/28/14 1209  BP: 123/71  Pulse: 82  Temp: 96.9 F (36.1 C)     Body mass index is 50.26 kg/(m^2).    ECOG FS:1 - Symptomatic but completely ambulatory  GENERAL: Patient is alert and oriented and in no acute distress. There is no icterus. HEENT: EOMs intact. Oral exam negative for thrush or lesions.  No cervical lymphadenopathy. CVS: S1S2, regular LUNGS: Bilaterally clear to auscultation, no rhonchi. ABDOMEN: Soft, nontender. No hepatosplenomegaly clinically.  NEURO: grossly nonfocal, cranial nerves are intact.   EXTREMITIES: trace pedal edema.   LAB RESULTS:    Component Value Date/Time   NA 135 07/28/2014 1142   NA 136 03/02/2014 1411   K 4.4 07/28/2014 1142   K 4.0 03/02/2014 1411   CL 98* 07/28/2014 1142   CL 97* 03/02/2014 1411   CO2 28 07/28/2014 1142   CO2 32 03/02/2014 1411   GLUCOSE 174* 07/28/2014 1142   GLUCOSE 141* 03/02/2014 1411   BUN 18 07/28/2014 1142   BUN 18 03/02/2014 1411   CREATININE 0.90 07/28/2014 1142   CREATININE 1.26 03/02/2014 1411   CALCIUM 9.0 07/28/2014 1142   CALCIUM 9.1 03/02/2014 1411   PROT 6.8 07/28/2014 1142   PROT 7.1 03/02/2014 1411   ALBUMIN 3.3* 07/28/2014 1142   ALBUMIN 2.9* 03/02/2014 1411   AST 58* 07/28/2014 1142   AST 17 03/02/2014 1411   ALT 63* 07/28/2014 1142   ALT 21 03/02/2014 1411   ALKPHOS 55 07/28/2014 1142   ALKPHOS 69 03/02/2014 1411   BILITOT 0.3 07/28/2014 1142   GFRNONAA >60 07/28/2014 1142   GFRNONAA >60 11/11/2013 1451   GFRAA >60 07/28/2014 1142   GFRAA >60 11/11/2013 1451   Lab Results  Component Value Date   WBC 7.3 07/28/2014   NEUTROABS 5.7 07/28/2014   HGB 12.1 07/28/2014   HCT 36.1 07/28/2014   MCV 86.2 07/28/2014   PLT 209 07/28/2014     STUDIES: 07/21/13 -   BCR-ABL1, CML/ALL, PCR, Quant b2a2 transcript <0.001 %  (was <0.001 % in Nov 2014) b3a2 transcript <0.001 %  (was  0.548 % in Nov 2014) e1a2 transcript <0.001 %  (was <0.001 % in Nov 2014)  11/01/13 - b3a2 increased to 0.052%.  Otherwise b2a2 and e1a2 < 0.001%. 03/02/14 - b3a2 further  increased to 5.456%.  Otherwise b2a2 and e1a2 < 0.001%.  07/28/14 - BCR-ABL study shows b2a2, b3a2, e1a2 all < 0.001%.   ASSESSMENT / PLAN:   1. CML (chronic myelogenous leukemia, chronic phase). Peripheral blood BCR-ABL assays on 05/25/12 showed  response to Tasigna with b3a2 transcript down to 0.004% but then had increased back up to 0.575% in July 2014. BCR-ABL in Nov 2014 showed increase in b3a2 transcript to 0.548% and she was changed to [facility], patient however herself changed treatment back to [place] on 03/12/13 shortly after which BCR-ABL on 03/25/13 showed the b3a2 transcript was down to 0.045% which likely was secondary to [facility] treatment. This was then discussed with patient over the phone, and she switched treatment back to Bosulif 400 mg daily on 04/02/13. Repeat BCR-ABL study in May showed complete molecular response. She however has had issues with abnnormal LFTs and Bosulif dose has been reduced.  In Sept 2015, b3a2 increased to 0.052%. Patient advised to increase Bosulif to 300 mg daily but she took only 100 mg daily and on 03/02/14, the b3a2 further increased to 5.456%. Since then taking  Bosulif 300 mg daily - reviewed labs and d/w patient. She is currently on Bosulif 300 mg p.o. daily, liver transaminases are doing fairly well and AST/ALT are just above normal range. The BCR-ABL study shows repeat molecular remission. Plan is to continue on current dose of Bosulif at 300 mg daily. Will continue to monitor CBC, met-B, phosphorus, LFT, magnesium once every 3 weeks. Will get repeat BCR-ABL at12 weeks. Next MD followup at 15 weeks with repeat labs. Patient explained that we may need to adjust dose of Bosulif based on liver function tests and also response to therapy or consider changing to a different agent for CML treatment if indicated.  2. Hypomagnesemia, hypophosphatemia -  better. Continue to monitor and supplement accordingly. 3. Fluid retention, weight gain - continue current dose of diuretics. 4. Abnormal liver transaminases - Plan is as mentioned above, continue close monitoring. 5. In between visits, patient advised to call or come to ER in case of any fevers, sickness, bleeding, or new symptoms. She is agreeable to this plan.      Leia Alf, MD   08/06/2014 12:49 PM

## 2014-08-12 ENCOUNTER — Other Ambulatory Visit: Payer: Self-pay | Admitting: *Deleted

## 2014-08-18 ENCOUNTER — Inpatient Hospital Stay: Payer: Medicaid Other | Attending: Internal Medicine

## 2014-08-30 ENCOUNTER — Ambulatory Visit: Payer: Medicaid Other | Admitting: Pulmonary Disease

## 2014-08-30 ENCOUNTER — Encounter: Payer: Self-pay | Admitting: Internal Medicine

## 2014-08-30 ENCOUNTER — Telehealth: Payer: Self-pay | Admitting: Internal Medicine

## 2014-08-30 NOTE — Telephone Encounter (Signed)
If she can't make it for an urgent care visit in [place], then ask her to visit her local PMD or urgent care.  She may require further w\u with a CXR, and labs; which will not be done over the phone.

## 2014-08-30 NOTE — Telephone Encounter (Signed)
Advised her of VM's recommendations. She has been placed on BQ's schedule today at 4:30pm. Nothing further was needed.

## 2014-08-30 NOTE — Telephone Encounter (Signed)
Called, spoke with pt.  C/o increased SOB with minimal activity like going to the bathroom and hemoptysis with thick, dark blood mixed in with mucus since Saturday.  Fever of 101-102 and chills Saturday and Sunday nights.  Denies chest tightness or CP.  Using albuterol hfa with relief of SOB.  Last seen by VM March 2016.  Advised OV is needed especially with given symptoms.  Pt states she is tired and can't make it in for an appt by herself.  States she knows "this is pna" and is requesting VM send abx.  Advised would send msg to VM for recs.  Pt is aware she is to call 911 if symptoms worsen and feels she needs immediate attention.  Pt verbalized understanding and in agreement with plan.  Dr. Stevenson Clinch, please advise.  Thank you.

## 2014-08-31 ENCOUNTER — Encounter: Payer: Self-pay | Admitting: Internal Medicine

## 2014-08-31 ENCOUNTER — Inpatient Hospital Stay
Admission: EM | Admit: 2014-08-31 | Discharge: 2014-09-07 | DRG: 871 | Disposition: A | Discharge status: Home / Self Care | Payer: Medicaid Other | Attending: Internal Medicine | Admitting: Internal Medicine

## 2014-08-31 ENCOUNTER — Other Ambulatory Visit: Payer: Self-pay | Admitting: Emergency Medicine

## 2014-08-31 ENCOUNTER — Emergency Department: Payer: Medicaid Other

## 2014-08-31 DIAGNOSIS — L22 Diaper dermatitis: Secondary | ICD-10-CM | POA: Diagnosis present

## 2014-08-31 DIAGNOSIS — Z885 Allergy status to narcotic agent status: Secondary | ICD-10-CM | POA: Diagnosis not present

## 2014-08-31 DIAGNOSIS — Z87891 Personal history of nicotine dependence: Secondary | ICD-10-CM | POA: Diagnosis not present

## 2014-08-31 DIAGNOSIS — E875 Hyperkalemia: Secondary | ICD-10-CM | POA: Diagnosis present

## 2014-08-31 DIAGNOSIS — K5900 Constipation, unspecified: Secondary | ICD-10-CM | POA: Diagnosis present

## 2014-08-31 DIAGNOSIS — IMO0001 Reserved for inherently not codable concepts without codable children: Secondary | ICD-10-CM

## 2014-08-31 DIAGNOSIS — M545 Low back pain: Secondary | ICD-10-CM | POA: Diagnosis present

## 2014-08-31 DIAGNOSIS — R042 Hemoptysis: Secondary | ICD-10-CM | POA: Diagnosis present

## 2014-08-31 DIAGNOSIS — Z794 Long term (current) use of insulin: Secondary | ICD-10-CM

## 2014-08-31 DIAGNOSIS — M349 Systemic sclerosis, unspecified: Secondary | ICD-10-CM | POA: Diagnosis present

## 2014-08-31 DIAGNOSIS — Z7951 Long term (current) use of inhaled steroids: Secondary | ICD-10-CM

## 2014-08-31 DIAGNOSIS — N179 Acute kidney failure, unspecified: Secondary | ICD-10-CM

## 2014-08-31 DIAGNOSIS — M4804 Spinal stenosis, thoracic region: Secondary | ICD-10-CM | POA: Diagnosis present

## 2014-08-31 DIAGNOSIS — E785 Hyperlipidemia, unspecified: Secondary | ICD-10-CM | POA: Diagnosis present

## 2014-08-31 DIAGNOSIS — Z6841 Body Mass Index (BMI) 40.0 and over, adult: Secondary | ICD-10-CM | POA: Diagnosis not present

## 2014-08-31 DIAGNOSIS — E119 Type 2 diabetes mellitus without complications: Secondary | ICD-10-CM | POA: Diagnosis present

## 2014-08-31 DIAGNOSIS — R1310 Dysphagia, unspecified: Secondary | ICD-10-CM | POA: Diagnosis present

## 2014-08-31 DIAGNOSIS — J9601 Acute respiratory failure with hypoxia: Secondary | ICD-10-CM | POA: Diagnosis present

## 2014-08-31 DIAGNOSIS — R0902 Hypoxemia: Secondary | ICD-10-CM

## 2014-08-31 DIAGNOSIS — D509 Iron deficiency anemia, unspecified: Secondary | ICD-10-CM | POA: Diagnosis present

## 2014-08-31 DIAGNOSIS — E662 Morbid (severe) obesity with alveolar hypoventilation: Secondary | ICD-10-CM | POA: Diagnosis present

## 2014-08-31 DIAGNOSIS — Z79899 Other long term (current) drug therapy: Secondary | ICD-10-CM

## 2014-08-31 DIAGNOSIS — J189 Pneumonia, unspecified organism: Secondary | ICD-10-CM | POA: Diagnosis present

## 2014-08-31 DIAGNOSIS — E860 Dehydration: Secondary | ICD-10-CM | POA: Diagnosis present

## 2014-08-31 DIAGNOSIS — B372 Candidiasis of skin and nail: Secondary | ICD-10-CM

## 2014-08-31 DIAGNOSIS — N189 Chronic kidney disease, unspecified: Secondary | ICD-10-CM | POA: Diagnosis present

## 2014-08-31 DIAGNOSIS — I10 Essential (primary) hypertension: Secondary | ICD-10-CM | POA: Diagnosis present

## 2014-08-31 DIAGNOSIS — G4733 Obstructive sleep apnea (adult) (pediatric): Secondary | ICD-10-CM

## 2014-08-31 DIAGNOSIS — M1990 Unspecified osteoarthritis, unspecified site: Secondary | ICD-10-CM | POA: Diagnosis present

## 2014-08-31 DIAGNOSIS — D649 Anemia, unspecified: Secondary | ICD-10-CM | POA: Diagnosis present

## 2014-08-31 DIAGNOSIS — L304 Erythema intertrigo: Secondary | ICD-10-CM

## 2014-08-31 DIAGNOSIS — A419 Sepsis, unspecified organism: Principal | ICD-10-CM | POA: Diagnosis present

## 2014-08-31 DIAGNOSIS — B373 Candidiasis of vulva and vagina: Secondary | ICD-10-CM | POA: Diagnosis present

## 2014-08-31 DIAGNOSIS — Z8711 Personal history of peptic ulcer disease: Secondary | ICD-10-CM | POA: Diagnosis not present

## 2014-08-31 DIAGNOSIS — F410 Panic disorder [episodic paroxysmal anxiety] without agoraphobia: Secondary | ICD-10-CM | POA: Diagnosis present

## 2014-08-31 DIAGNOSIS — C9210 Chronic myeloid leukemia, BCR/ABL-positive, not having achieved remission: Secondary | ICD-10-CM | POA: Diagnosis present

## 2014-08-31 DIAGNOSIS — G8929 Other chronic pain: Secondary | ICD-10-CM | POA: Diagnosis present

## 2014-08-31 DIAGNOSIS — I129 Hypertensive chronic kidney disease with stage 1 through stage 4 chronic kidney disease, or unspecified chronic kidney disease: Secondary | ICD-10-CM | POA: Diagnosis present

## 2014-08-31 DIAGNOSIS — F329 Major depressive disorder, single episode, unspecified: Secondary | ICD-10-CM | POA: Diagnosis present

## 2014-08-31 DIAGNOSIS — K219 Gastro-esophageal reflux disease without esophagitis: Secondary | ICD-10-CM

## 2014-08-31 DIAGNOSIS — E871 Hypo-osmolality and hyponatremia: Secondary | ICD-10-CM | POA: Diagnosis present

## 2014-08-31 DIAGNOSIS — M549 Dorsalgia, unspecified: Secondary | ICD-10-CM

## 2014-08-31 LAB — CBC WITH DIFFERENTIAL/PLATELET
BASOS PCT: 0 %
Basophils Absolute: 0 10*3/uL (ref 0–0.1)
Eosinophils Absolute: 0.1 10*3/uL (ref 0–0.7)
Eosinophils Relative: 1 %
HEMATOCRIT: 30.5 % — AB (ref 35.0–47.0)
HEMOGLOBIN: 10.2 g/dL — AB (ref 12.0–16.0)
Lymphocytes Relative: 4 %
Lymphs Abs: 0.6 10*3/uL — ABNORMAL LOW (ref 1.0–3.6)
MCH: 29.4 pg (ref 26.0–34.0)
MCHC: 33.5 g/dL (ref 32.0–36.0)
MCV: 88 fL (ref 80.0–100.0)
MONO ABS: 0.7 10*3/uL (ref 0.2–0.9)
MONOS PCT: 4 %
NEUTROS ABS: 14.3 10*3/uL — AB (ref 1.4–6.5)
Neutrophils Relative %: 91 %
Platelets: 203 10*3/uL (ref 150–440)
RBC: 3.46 MIL/uL — ABNORMAL LOW (ref 3.80–5.20)
RDW: 14.3 % (ref 11.5–14.5)
WBC: 15.7 10*3/uL — AB (ref 3.6–11.0)

## 2014-08-31 LAB — URINALYSIS COMPLETE WITH MICROSCOPIC (ARMC ONLY)
Bilirubin Urine: NEGATIVE
Glucose, UA: NEGATIVE mg/dL
HGB URINE DIPSTICK: NEGATIVE
Ketones, ur: NEGATIVE mg/dL
LEUKOCYTES UA: NEGATIVE
Nitrite: NEGATIVE
PH: 9 — AB (ref 5.0–8.0)
Protein, ur: NEGATIVE mg/dL
RBC / HPF: NONE SEEN RBC/hpf (ref 0–5)
Specific Gravity, Urine: 1.005 (ref 1.005–1.030)
WBC, UA: NONE SEEN WBC/hpf (ref 0–5)

## 2014-08-31 LAB — PROTIME-INR
INR: 1.11
PROTHROMBIN TIME: 14.5 s (ref 11.4–15.0)

## 2014-08-31 LAB — COMPREHENSIVE METABOLIC PANEL
ALK PHOS: 60 U/L (ref 38–126)
ALT: 33 U/L (ref 14–54)
ANION GAP: 8 (ref 5–15)
AST: 25 U/L (ref 15–41)
Albumin: 2.8 g/dL — ABNORMAL LOW (ref 3.5–5.0)
BILIRUBIN TOTAL: 0.6 mg/dL (ref 0.3–1.2)
BUN: 25 mg/dL — AB (ref 6–20)
CO2: 26 mmol/L (ref 22–32)
CREATININE: 1.33 mg/dL — AB (ref 0.44–1.00)
Calcium: 7.9 mg/dL — ABNORMAL LOW (ref 8.9–10.3)
Chloride: 95 mmol/L — ABNORMAL LOW (ref 101–111)
GFR calc non Af Amer: 43 mL/min — ABNORMAL LOW (ref >60)
GFR, EST AFRICAN AMERICAN: 50 mL/min — AB (ref >60)
GLUCOSE: 143 mg/dL — AB (ref 65–99)
POTASSIUM: 4.9 mmol/L (ref 3.5–5.1)
Sodium: 129 mmol/L — ABNORMAL LOW (ref 135–145)
Total Protein: 6.6 g/dL (ref 6.5–8.1)

## 2014-08-31 LAB — GLUCOSE, CAPILLARY: Glucose-Capillary: 89 mg/dL (ref 65–99)

## 2014-08-31 LAB — TYPE AND SCREEN
ABO/RH(D): A POS
Antibody Screen: POSITIVE

## 2014-08-31 LAB — MISC LABCORP TEST (SEND OUT): Labcorp test code: 480510

## 2014-08-31 LAB — TROPONIN I: Troponin I: <0.03 ng/mL (ref <0.031)

## 2014-08-31 MED ORDER — ESTRADIOL 1 MG PO TABS
2.0000 mg | ORAL_TABLET | Freq: Every day | ORAL | Status: DC
  Administered 2014-09-01 – 2014-09-07 (×7): 2 mg via ORAL
  Filled 2014-08-31 (×7): qty 2

## 2014-08-31 MED ORDER — ALPRAZOLAM 1 MG PO TABS
1.0000 mg | ORAL_TABLET | Freq: Three times a day (TID) | ORAL | Status: DC | PRN
  Administered 2014-09-01 – 2014-09-07 (×11): 1 mg via ORAL
  Filled 2014-08-31 (×12): qty 1

## 2014-08-31 MED ORDER — MORPHINE SULFATE 2 MG/ML IJ SOLN
INTRAMUSCULAR | Status: AC
  Filled 2014-08-31: qty 1

## 2014-08-31 MED ORDER — TRAZODONE HCL 50 MG PO TABS
150.0000 mg | ORAL_TABLET | Freq: Every day | ORAL | Status: DC
  Administered 2014-09-01 – 2014-09-06 (×7): 150 mg via ORAL
  Filled 2014-08-31 (×7): qty 1

## 2014-08-31 MED ORDER — BUTALBITAL-APAP-CAFFEINE 50-325-40 MG PO TABS
1.0000 | ORAL_TABLET | ORAL | Status: DC | PRN
  Administered 2014-09-03: 14:00:00 1 via ORAL
  Filled 2014-08-31 (×2): qty 1

## 2014-08-31 MED ORDER — VANCOMYCIN HCL IN DEXTROSE 1-5 GM/200ML-% IV SOLN
1000.0000 mg | Freq: Once | INTRAVENOUS | Status: AC
  Administered 2014-08-31: 1000 mg via INTRAVENOUS

## 2014-08-31 MED ORDER — ONDANSETRON HCL 4 MG/2ML IJ SOLN
4.0000 mg | Freq: Once | INTRAMUSCULAR | Status: AC
  Administered 2014-08-31: 4 mg via INTRAVENOUS

## 2014-08-31 MED ORDER — SODIUM CHLORIDE 0.9 % IV SOLN
INTRAVENOUS | Status: DC
  Administered 2014-09-01 – 2014-09-02 (×4): via INTRAVENOUS

## 2014-08-31 MED ORDER — CEFTRIAXONE SODIUM IN DEXTROSE 20 MG/ML IV SOLN
1.0000 g | Freq: Once | INTRAVENOUS | Status: AC
  Administered 2014-08-31: 1 g via INTRAVENOUS

## 2014-08-31 MED ORDER — INSULIN ASPART 100 UNIT/ML ~~LOC~~ SOLN
0.0000 [IU] | Freq: Three times a day (TID) | SUBCUTANEOUS | Status: DC
  Administered 2014-09-01 (×2): 5 [IU] via SUBCUTANEOUS
  Administered 2014-09-01: 12:00:00 7 [IU] via SUBCUTANEOUS
  Administered 2014-09-02 (×2): 5 [IU] via SUBCUTANEOUS
  Filled 2014-08-31 (×3): qty 5
  Filled 2014-08-31: qty 7
  Filled 2014-08-31: qty 5

## 2014-08-31 MED ORDER — VANCOMYCIN HCL IN DEXTROSE 1-5 GM/200ML-% IV SOLN
INTRAVENOUS | Status: AC
  Filled 2014-08-31: qty 200

## 2014-08-31 MED ORDER — ACETAMINOPHEN 650 MG RE SUPP: 650.0000 mg | Freq: Four times a day (QID) | RECTAL | Status: DC | PRN

## 2014-08-31 MED ORDER — FLUTICASONE PROPIONATE 50 MCG/ACT NA SUSP
1.0000 | NASAL | Status: DC | PRN
  Administered 2014-09-06 – 2014-09-07 (×3): 2 via NASAL
  Filled 2014-08-31: qty 16

## 2014-08-31 MED ORDER — LEVOFLOXACIN IN D5W 750 MG/150ML IV SOLN
750.0000 mg | Freq: Once | INTRAVENOUS | Status: AC
  Administered 2014-08-31: 750 mg via INTRAVENOUS

## 2014-08-31 MED ORDER — METHYLPREDNISOLONE SODIUM SUCC 125 MG IJ SOLR
60.0000 mg | Freq: Four times a day (QID) | INTRAMUSCULAR | Status: DC
  Administered 2014-09-01 (×3): 60 mg via INTRAVENOUS
  Filled 2014-08-31 (×3): qty 2

## 2014-08-31 MED ORDER — ONDANSETRON HCL 4 MG/2ML IJ SOLN
4.0000 mg | Freq: Four times a day (QID) | INTRAMUSCULAR | Status: DC | PRN
  Administered 2014-08-31 – 2014-09-07 (×7): 4 mg via INTRAVENOUS
  Filled 2014-08-31 (×6): qty 2

## 2014-08-31 MED ORDER — PREGABALIN 75 MG PO CAPS
150.0000 mg | ORAL_CAPSULE | Freq: Every day | ORAL | Status: DC
  Administered 2014-09-01 – 2014-09-03 (×3): 150 mg via ORAL
  Filled 2014-08-31 (×3): qty 2

## 2014-08-31 MED ORDER — ACETAMINOPHEN-CODEINE #3 300-30 MG PO TABS
1.0000 | ORAL_TABLET | Freq: Two times a day (BID) | ORAL | Status: DC | PRN
  Administered 2014-09-05 – 2014-09-07 (×4): 1 via ORAL
  Filled 2014-08-31 (×4): qty 1

## 2014-08-31 MED ORDER — INSULIN ASPART 100 UNIT/ML ~~LOC~~ SOLN
0.0000 [IU] | Freq: Every day | SUBCUTANEOUS | Status: DC
  Administered 2014-09-01: 22:00:00 5 [IU] via SUBCUTANEOUS
  Filled 2014-08-31: qty 5

## 2014-08-31 MED ORDER — CELECOXIB 200 MG PO CAPS
200.0000 mg | ORAL_CAPSULE | Freq: Two times a day (BID) | ORAL | Status: DC
  Administered 2014-09-01 – 2014-09-06 (×6): 200 mg via ORAL
  Filled 2014-08-31 (×9): qty 1

## 2014-08-31 MED ORDER — CEFTRIAXONE SODIUM IN DEXTROSE 20 MG/ML IV SOLN
INTRAVENOUS | Status: AC
  Administered 2014-08-31: 1 g via INTRAVENOUS
  Filled 2014-08-31: qty 50

## 2014-08-31 MED ORDER — CEFTRIAXONE SODIUM IN DEXTROSE 20 MG/ML IV SOLN
1.0000 g | INTRAVENOUS | Status: DC
  Administered 2014-09-01: 1 g via INTRAVENOUS
  Filled 2014-08-31 (×2): qty 50

## 2014-08-31 MED ORDER — OMEGA-3-ACID ETHYL ESTERS 1 G PO CAPS
1.0000 | ORAL_CAPSULE | Freq: Every day | ORAL | Status: DC
  Administered 2014-09-01 – 2014-09-07 (×7): 1 g via ORAL
  Filled 2014-08-31 (×13): qty 1

## 2014-08-31 MED ORDER — FENTANYL CITRATE (PF) 100 MCG/2ML IJ SOLN
INTRAMUSCULAR | Status: AC
  Filled 2014-08-31: qty 2

## 2014-08-31 MED ORDER — MORPHINE SULFATE 2 MG/ML IJ SOLN
2.0000 mg | INTRAMUSCULAR | Status: DC | PRN
  Administered 2014-08-31 – 2014-09-01 (×3): 2 mg via INTRAVENOUS
  Filled 2014-08-31 (×2): qty 1

## 2014-08-31 MED ORDER — ENOXAPARIN SODIUM 40 MG/0.4ML ~~LOC~~ SOLN
40.0000 mg | Freq: Two times a day (BID) | SUBCUTANEOUS | Status: DC
  Administered 2014-09-01 – 2014-09-07 (×14): 40 mg via SUBCUTANEOUS
  Filled 2014-08-31 (×14): qty 0.4

## 2014-08-31 MED ORDER — LORATADINE 10 MG PO TABS
10.0000 mg | ORAL_TABLET | Freq: Every day | ORAL | Status: DC
  Administered 2014-09-01 – 2014-09-07 (×7): 10 mg via ORAL
  Filled 2014-08-31 (×7): qty 1

## 2014-08-31 MED ORDER — ENOXAPARIN SODIUM 40 MG/0.4ML ~~LOC~~ SOLN: 40.0000 mg | SUBCUTANEOUS | Status: DC

## 2014-08-31 MED ORDER — DOCUSATE SODIUM 100 MG PO CAPS: 100.0000 mg | ORAL_CAPSULE | Freq: Two times a day (BID) | ORAL | Status: DC | PRN

## 2014-08-31 MED ORDER — BOSUTINIB 100 MG PO TABS: 300.0000 mg | ORAL_TABLET | Freq: Every day | ORAL | Status: DC

## 2014-08-31 MED ORDER — MONTELUKAST SODIUM 10 MG PO TABS
10.0000 mg | ORAL_TABLET | Freq: Every day | ORAL | Status: DC
  Administered 2014-09-01 – 2014-09-06 (×7): 10 mg via ORAL
  Filled 2014-08-31 (×8): qty 1

## 2014-08-31 MED ORDER — GABAPENTIN 100 MG PO CAPS
300.0000 mg | ORAL_CAPSULE | Freq: Four times a day (QID) | ORAL | Status: DC
  Administered 2014-09-01 – 2014-09-06 (×16): 300 mg via ORAL
  Filled 2014-08-31 (×17): qty 3

## 2014-08-31 MED ORDER — ONDANSETRON HCL 4 MG/2ML IJ SOLN
INTRAMUSCULAR | Status: AC
  Filled 2014-08-31: qty 2

## 2014-08-31 MED ORDER — BUTALBITAL-APAP-CAFFEINE 50-325-40 MG PO TABS
ORAL_TABLET | ORAL | Status: AC
  Administered 2014-08-31: 1
  Filled 2014-08-31: qty 1

## 2014-08-31 MED ORDER — LEVOFLOXACIN IN D5W 750 MG/150ML IV SOLN
INTRAVENOUS | Status: AC
  Filled 2014-08-31: qty 150

## 2014-08-31 MED ORDER — LEVOFLOXACIN IN D5W 750 MG/150ML IV SOLN
750.0000 mg | INTRAVENOUS | Status: DC
  Administered 2014-09-01 – 2014-09-03 (×3): 750 mg via INTRAVENOUS
  Filled 2014-08-31 (×4): qty 150

## 2014-08-31 MED ORDER — INSULIN GLARGINE 100 UNIT/ML ~~LOC~~ SOLN
20.0000 [IU] | Freq: Every day | SUBCUTANEOUS | Status: DC
  Administered 2014-09-01 – 2014-09-02 (×2): 20 [IU] via SUBCUTANEOUS
  Filled 2014-08-31 (×3): qty 0.2

## 2014-08-31 MED ORDER — DOCUSATE SODIUM 100 MG PO CAPS
100.0000 mg | ORAL_CAPSULE | Freq: Two times a day (BID) | ORAL | Status: DC
Start: 2014-08-31 — End: 2014-09-07
  Administered 2014-09-01 – 2014-09-07 (×12): 100 mg via ORAL
  Filled 2014-08-31 (×14): qty 1

## 2014-08-31 MED ORDER — DIPHENHYDRAMINE HCL 25 MG PO TABS
25.0000 mg | ORAL_TABLET | Freq: Four times a day (QID) | ORAL | Status: DC | PRN
  Administered 2014-09-01 – 2014-09-04 (×4): 25 mg via ORAL
  Filled 2014-08-31 (×9): qty 1

## 2014-08-31 MED ORDER — ACETAMINOPHEN 325 MG PO TABS
650.0000 mg | ORAL_TABLET | Freq: Four times a day (QID) | ORAL | Status: DC | PRN
  Administered 2014-09-05: 10:00:00 650 mg via ORAL
  Filled 2014-08-31: qty 2

## 2014-08-31 MED ORDER — DULOXETINE HCL 20 MG PO CPEP
20.0000 mg | ORAL_CAPSULE | Freq: Three times a day (TID) | ORAL | Status: DC
  Administered 2014-09-01 – 2014-09-03 (×7): 20 mg via ORAL
  Filled 2014-08-31 (×10): qty 1

## 2014-08-31 MED ORDER — ALBUTEROL SULFATE (2.5 MG/3ML) 0.083% IN NEBU: 2.5000 mg | INHALATION_SOLUTION | Freq: Four times a day (QID) | RESPIRATORY_TRACT | Status: DC | PRN

## 2014-08-31 MED ORDER — FENTANYL CITRATE (PF) 100 MCG/2ML IJ SOLN
50.0000 ug | Freq: Once | INTRAMUSCULAR | Status: AC
Start: 2014-08-31 — End: 2014-08-31
  Administered 2014-08-31: 50 ug via INTRAVENOUS

## 2014-08-31 NOTE — ED Notes (Signed)
Pt to Xray at this time

## 2014-08-31 NOTE — ED Provider Notes (Signed)
[facility] Emergency Department Provider Note  Time seen: 5:16 PM  I have reviewed the triage vital signs and the nursing notes.   HISTORY  Chief Complaint Fever    HPI Tara Levine is a 59 y.o. female with a past medical history of leukemia who currently on daily chemotherapy, presents the emergency department with 5 days of cough, congestion, now with hemoptysis 2 days, and fever to 103 beginning yesterday. Patient states last time she was coughing up blood with a fever she was diagnosed with pneumonia approximately one year ago. Patient states she is having considerable amount of cough getting up globs of sputum with occasional blood and blood clots. Describes some chest pain with cough as moderate, diffuse. Denies any chest pain when she is not coughing. States she has been feeling very weak and has been sleeping all day today.     Past Medical History  Diagnosis Date  . Unspecified essential hypertension   . HLD (hyperlipidemia)   . DM type 2 (diabetes mellitus, type 2)   . Environmental allergies   . Osteoarthritis   . IDA (iron deficiency anemia)     chronic  . History of gastric ulcer   . Panic disorder   . PSS (progressive systemic sclerosis)   . CML (chronic myelocytic leukemia)   . Leukemia, chronic myeloid     Patient Active Problem List   Diagnosis Date Noted  . Chronic diastolic CHF (congestive heart failure) 05/18/2014  . Acute bronchitis 04/05/2014  . Severe obesity (BMI >= 40) 02/04/2014  . Cough 01/31/2014  . Snoring 01/31/2014  . SOB (shortness of breath) 01/31/2014  . Post-nasal drip 01/31/2014  . Abnormal EKG 04/02/2011  . Chest tightness 04/02/2011  . Chronic pain 04/02/2011  . HTN (hypertension) 04/02/2011  . Anxiety and depression 04/02/2011    Past Surgical History  Procedure Laterality Date  . Back and neck surgery  1999, 2009    had rods placed  . Partial hysterectomy      age 68, no cancer  . Carpal  tunnel release    . Cardiac catheterization  [facility]) No blackages found.     Current Outpatient Rx  Name  Route  Sig  Dispense  Refill  . albuterol (PROAIR HFA) 108 (90 BASE) MCG/ACT inhaler   Inhalation   Inhale 90 mcg into the lungs as needed.         Marland Kitchen albuterol (PROVENTIL) (2.5 MG/3ML) 0.083% nebulizer solution   Nebulization   Take 2.5 mg by nebulization as needed.         . ALPRAZolam (XANAX) 0.5 MG tablet   Oral   Take 1 mg by mouth 3 (three) times daily as needed.          . Ascorbic Acid (VITAMIN C) 1000 MG tablet   Oral   Take 1,000 mg by mouth 2 (two) times daily.         . bosutinib (BOSULIF) 100 MG tablet   Oral   Take 100 mg by mouth 3 (three) times daily. Take with food.         . butalbital-acetaminophen-caffeine (FIORICET WITH CODEINE) 50-325-40-30 MG per capsule   Oral   Take 1 capsule by mouth 2 (two) times daily.         . carisoprodol (SOMA) 250 MG tablet   Oral   Take 250 mg by mouth 2 (two) times daily.         Marland Kitchen  carisoprodol (SOMA) 350 MG tablet   Oral   Take 250 mg by mouth 4 (four) times daily as needed. HAS NOT HAD RX IN 3 MONTH         . celecoxib (CELEBREX) 200 MG capsule   Oral   Take 200 mg by mouth 2 (two) times daily.         . cetirizine (ZYRTEC) 10 MG tablet   Oral   Take 10 mg by mouth as needed.         . diphenhydrAMINE (BENADRYL) 25 MG tablet   Oral   Take 25 mg by mouth daily.         Marland Kitchen docusate sodium (COLACE) 100 MG capsule   Oral   Take 100 mg by mouth as directed.         . DULoxetine (CYMBALTA) 20 MG capsule   Oral   Take 20 mg by mouth 3 (three) times daily.         Marland Kitchen estradiol (ESTRACE) 2 MG tablet   Oral   Take 2 mg by mouth daily.         . fluticasone (FLONASE) 50 MCG/ACT nasal spray   Each Nare   Place 1 spray into both nostrils as needed.          . furosemide (LASIX) 20 MG tablet   Oral   Take 40 mg by mouth daily.          Marland Kitchen  gabapentin (NEURONTIN) 300 MG capsule   Oral   Take 300 mg by mouth 3 (three) times daily.         . hydrocortisone (ANUSOL-HC) 2.5 % rectal cream   Rectal   Place 1 application rectally daily.         . insulin aspart (NOVOLOG FLEXPEN) 100 UNIT/ML FlexPen   Subcutaneous   Inject 15 Units into the skin 3 (three) times daily.         . insulin glargine (LANTUS) 100 UNIT/ML injection   Subcutaneous   Inject 20 Units into the skin daily.         Marland Kitchen losartan (COZAAR) 100 MG tablet   Oral   Take 100 mg by mouth daily.         . Magnesium (M2 MAGNESIUM) 100 MG CAPS   Oral   Take 500 mg by mouth as needed.          . meclizine (ANTIVERT) 25 MG tablet   Oral   Take 25 mg by mouth 3 (three) times daily as needed.         . Montelukast Sodium (SINGULAIR PO)   Oral   Take by mouth daily.         . Naproxen Sodium (ALEVE) 220 MG CAPS   Oral   Take by mouth as needed.         Marland Kitchen olopatadine (PATANOL) 0.1 % ophthalmic solution   Ophthalmic   Apply 1 drop to eye 2 (two) times daily.         . promethazine (PHENERGAN) 25 MG tablet   Oral   Take 25 mg by mouth every 6 (six) hours as needed.         Marland Kitchen SM MULTIPLE VITAMINS/IRON TABS   Oral   Take 1 tablet by mouth daily.         Marland Kitchen spironolactone (ALDACTONE) 25 MG tablet   Oral   Take 25 mg by mouth daily.         Marland Kitchen  traZODone (DESYREL) 150 MG tablet   Oral   Take by mouth at bedtime.         . Vilazodone HCl (VIIBRYD) 40 MG TABS   Oral   Take 40 mg by mouth daily.         . vitamin B-12 (CYANOCOBALAMIN) 1000 MCG tablet   Oral   Take 1,000 mcg by mouth daily.           Allergies Codeine; Latex; Morphine and related; Tape; and Nsaids  Family History  Problem Relation Age of Onset  . Diabetes Mother   . Hypertension Mother   . Heart disease Mother   . Prostate cancer Father   . Heart disease Father     Social History History  Substance Use Topics  . Smoking status: Former Research scientist (life sciences)   . Smokeless tobacco: Never Used     Comment: quit 1985  . Alcohol Use: No    Review of Systems Constitutional: Positive for fever. Cardiovascular: Positive for chest pain when coughing. Respiratory: Moderate shortness of breath. Cough with hemoptysis and sputum. Gastrointestinal: Negative for abdominal pain, vomiting and diarrhea. Genitourinary: Negative for dysuria. Musculoskeletal: Negative for back pain.  10-point ROS otherwise negative.  ____________________________________________   PHYSICAL EXAM:  VITAL SIGNS: ED Triage Vitals  Enc Vitals Group     BP 08/31/14 1704 139/79 mmHg     Pulse Rate 08/31/14 1704 92     Resp 08/31/14 1704 26     Temp 08/31/14 1704 98.3 F (36.8 C)     Temp Source 08/31/14 1704 Oral     SpO2 08/31/14 1704 88 %     Weight 08/31/14 1704 300 lb (136.079 kg)     Height 08/31/14 1704 5\' 7"  (1.702 m)     Head Cir --      Peak Flow --      Pain Score --      Pain Loc --      Pain Edu? --      Excl. in [place]? --     Constitutional: Alert and oriented. Well appearing and in no distress. Eyes: Normal exam ENT   Mouth/Throat: Mucous membranes are moist. Occasional cough. Cardiovascular: Normal rate, regular rhythm. No murmur Respiratory: Normal respiratory effort without tachypnea nor retractions. Mild wheezes auscultated in bilateral lung fields, no rales or rhonchi. Gastrointestinal: Soft and nontender. No distention.  Obese. Musculoskeletal: Nontender with normal range of motion in all extremities.  Neurologic:  Normal speech and language. No gross focal neurologic deficits Skin:  Skin is warm, dry and intact. Psychiatric: Mood and affect are normal. Speech and behavior are normal.   ____________________________________________   RADIOLOGY  Chest x-ray consistent with multilobar pneumonia.  ____________________________________________    INITIAL IMPRESSION / ASSESSMENT AND PLAN / ED COURSE  Pertinent labs & imaging results that  were available during my care of the patient were reviewed by me and considered in my medical decision making (see chart for details).  Patient with 4-5 days of cough, congestion, now with hemoptysis and fever to 103. Suspect likely pneumonia. We will check labs, blood cultures, urine, urine culture, chest x-ray to help further evaluate.  Chest x-ray consistent with multilobar pneumonia. Given hypoxia, and fever to 103 at home we will start the patient on IV antibiotics. As the patient is on chemotherapy we will cover broadly with vancomycin, ceftriaxone, Levaquin.  Labs show an elevated white blood cell count of 15, and sodium of 129. Patient receiving IV fluids, as well as  IV antibiotics. We will admit the patient for multilobar pneumonia.  CRITICAL CARE Performed by: Harvest Dark   Total critical care time: 30 minutes  Critical care time was exclusive of separately billable procedures and treating other patients.  Critical care was necessary to treat or prevent imminent or life-threatening deterioration.  Critical care was time spent personally by me on the following activities: development of treatment plan with patient and/or surrogate as well as nursing, discussions with consultants, evaluation of patient's response to treatment, examination of patient, obtaining history from patient or surrogate, ordering and performing treatments and interventions, ordering and review of laboratory studies, ordering and review of radiographic studies, pulse oximetry and re-evaluation of patient's condition.  ____________________________________________   FINAL CLINICAL IMPRESSION(S) / ED DIAGNOSES  Hemoptysis Upper respiratory infection Multilobar pneumonia   Harvest Dark, MD 08/31/14 [phone]

## 2014-08-31 NOTE — ED Notes (Signed)
ED provider at bedside.

## 2014-08-31 NOTE — ED Notes (Signed)
Pt with hx of leukemia (on oral chemo) presents via EMS with c/o cough productive of bloody sputum and fever x 5 days,  SOB x 2 days.  Increased use of albuterol nebulizer with no relief,  Pt reports she has been sleeping most of the day today and she \\says  she has not used albuterol today

## 2014-08-31 NOTE — H&P (Signed)
[facility] [place] NAME: Tara Levine    MR#:  [id number]  DATE OF BIRTH:  02/04/1956  DATE OF ADMISSION:  08/31/2014  PRIMARY CARE PHYSICIAN: Salome Holmes, M.D. at [facility]  REQUESTING/REFERRING PHYSICIAN: Harvest Dark, M.D.  CHIEF COMPLAINT:   Chief Complaint  Patient presents with  . Fever    HISTORY OF PRESENT ILLNESS:  Tara Levine  is a 59 y.o. female with a known history of CML, anemia, diabetes. She presents with feeling sick since Saturday. She's coughing up blood clots at times tablespoons full of dark clots. She's been having pain in the back, legs, head and neck. She's been feeling short of breath. She's having fever chills and sweats. She's been feeling fatigued. She is having pain in the ribs with a deep breath and also coughs with a deep breath. In the ER she was found to have bilateral pneumonia. She was found to be hypoxic on room air with pulse ox of 88%. Hospitalist services were contacted for further evaluation  PAST MEDICAL HISTORY:   Past Medical History  Diagnosis Date  . Unspecified essential hypertension   . HLD (hyperlipidemia)   . DM type 2 (diabetes mellitus, type 2)   . Environmental allergies   . Osteoarthritis   . IDA (iron deficiency anemia)     chronic  . History of gastric ulcer   . Panic disorder   . PSS (progressive systemic sclerosis)   . CML (chronic myelocytic leukemia)   . Leukemia, chronic myeloid     PAST SURGICAL HISTORY:   Past Surgical History  Procedure Laterality Date  . Back and neck surgery  1999, 2009    had rods placed  . Partial hysterectomy      age 21, no cancer  . Carpal tunnel release    . Cardiac catheterization  [facility]) No blackages found.     SOCIAL HISTORY:   History  Substance Use Topics  . Smoking status: Former Research scientist (life sciences)  . Smokeless tobacco: Never Used     Comment: quit 1985  . Alcohol Use: No     FAMILY HISTORY:   Family History  Problem Relation Age of Onset  . Diabetes Mother   . Hypertension Mother   . Heart disease Mother   . Prostate cancer Father   . Heart disease Father   . Diabetes Brother     DRUG ALLERGIES:   Allergies  Allergen Reactions  . Codeine Other (See Comments)    Reaction: Unknown   . Latex Other (See Comments)    Reaction:  Unknown   . Morphine And Related Other (See Comments)    Reaction:  Unknown   . Tape Other (See Comments)    Reaction:  Unknown   . Nsaids Rash    REVIEW OF SYSTEMS:  CONSTITUTIONAL: Positive for fever, chills and sweats. Positive for fatigue EYES: Positive for blurred vision and can't see very well. She wears glasses. EARS, NOSE, AND THROAT: No tinnitus or ear pain. Positive for runny nose and sore throat. Positive for dysphagia to both liquids and solids. RESPIRATORY: Positive for cough and shortness of breath, positive for wheezing and hemoptysis.  CARDIOVASCULAR: Positive for chest pain in the ribs with a deep breath. GASTROINTESTINAL: No nausea, vomited all night on Saturday, no diarrhea, some abdominal pain. No blood in bowel movements. Positive for constipation. GENITOURINARY: Occasional burning on urination ENDOCRINE: No polyuria, nocturia,  HEMATOLOGY:  History of anemia SKIN: No rash or lesion. MUSCULOSKELETAL: Back, neck, leg pain NEUROLOGIC: No tingling, numbness.  PSYCHIATRY: No anxiety or depression.   MEDICATIONS AT HOME:   Prior to Admission medications   Medication Sig Start Date End Date Taking? Authorizing Provider  acetaminophen-codeine (TYLENOL #3) 300-30 MG per tablet Take 1 tablet by mouth every 12 (twelve) hours as needed for moderate pain.    Yes Historical Provider, MD  albuterol (PROVENTIL HFA;VENTOLIN HFA) 108 (90 BASE) MCG/ACT inhaler Inhale 2 puffs into the lungs every 6 (six) hours as needed for wheezing or shortness of breath.   Yes Historical Provider, MD  albuterol (PROVENTIL) (2.5  MG/3ML) 0.083% nebulizer solution Take 2.5 mg by nebulization every 6 (six) hours as needed for wheezing or shortness of breath.   Yes Historical Provider, MD  ALPRAZolam Duanne Moron) 1 MG tablet Take 1 mg by mouth 3 (three) times daily as needed for anxiety.   Yes Historical Provider, MD  Ascorbic Acid (VITAMIN C) 1000 MG tablet Take 3,000 mg by mouth daily.   Yes Historical Provider, MD  bosutinib (BOSULIF) 100 MG tablet Take 300 mg by mouth daily.    Yes Historical Provider, MD  butalbital-acetaminophen-caffeine (FIORICET WITH CODEINE) 50-325-40-30 MG per capsule Take 1 capsule by mouth every 4 (four) hours as needed for migraine.   Yes Historical Provider, MD  celecoxib (CELEBREX) 200 MG capsule Take 200 mg by mouth 2 (two) times daily.   Yes Historical Provider, MD  cetirizine (ZYRTEC) 10 MG tablet Take 20 mg by mouth daily.    Yes Historical Provider, MD  Cholecalciferol (VITAMIN D PO) Take 1 tablet by mouth daily.   Yes Historical Provider, MD  cyanocobalamin (,VITAMIN B-12,) 1000 MCG/ML injection Inject 1,000 mcg into the muscle every 30 (thirty) days.   Yes Historical Provider, MD  diphenhydrAMINE (BENADRYL) 25 MG tablet Take 25 mg by mouth every 6 (six) hours as needed for itching or allergies.    Yes Historical Provider, MD  docusate sodium (COLACE) 100 MG capsule Take 100 mg by mouth 2 (two) times daily as needed for mild constipation.    Yes Historical Provider, MD  DULoxetine (CYMBALTA) 20 MG capsule Take 20 mg by mouth 3 (three) times daily.   Yes Historical Provider, MD  estradiol (ESTRACE) 2 MG tablet Take 2 mg by mouth daily.   Yes Historical Provider, MD  fluticasone (FLONASE) 50 MCG/ACT nasal spray Place 1-2 sprays into both nostrils as needed for rhinitis.    Yes Historical Provider, MD  furosemide (LASIX) 40 MG tablet Take 40 mg by mouth daily.   Yes Historical Provider, MD  gabapentin (NEURONTIN) 300 MG capsule Take 300 mg by mouth 4 (four) times daily.    Yes Historical Provider, MD   insulin aspart (NOVOLOG) 100 UNIT/ML injection Inject 15 Units into the skin 3 (three) times daily as needed for high blood sugar.    Yes Historical Provider, MD  insulin glargine (LANTUS) 100 UNIT/ML injection Inject 20 Units into the skin daily.   Yes Historical Provider, MD  losartan (COZAAR) 100 MG tablet Take 100 mg by mouth daily.   Yes Historical Provider, MD  MAGNESIUM PO Take 5 tablets by mouth daily.   Yes Historical Provider, MD  metFORMIN (GLUCOPHAGE) 1000 MG tablet Take 1,000 mg by mouth 2 (two) times daily.   Yes Historical Provider, MD  montelukast (SINGULAIR) 10 MG tablet Take 10 mg by mouth at bedtime.   Yes Historical Provider, MD  naproxen sodium (ANAPROX) 220  MG tablet Take 440 mg by mouth 2 (two) times daily as needed (for pain).   Yes Historical Provider, MD  Omega-3 Fatty Acids (OMEGA 3 PO) Take 3 capsules by mouth daily.   Yes Historical Provider, MD  pregabalin (LYRICA) 150 MG capsule Take 150 mg by mouth daily.   Yes Historical Provider, MD  spironolactone (ALDACTONE) 25 MG tablet Take 25 mg by mouth 2 (two) times daily.    Yes Historical Provider, MD  traZODone (DESYREL) 150 MG tablet Take 150 mg by mouth at bedtime.    Yes Historical Provider, MD  vitamin B-12 (CYANOCOBALAMIN) 1000 MCG tablet Take 4,000 mcg by mouth daily.   Yes Historical Provider, MD      VITAL SIGNS:  Blood pressure 114/60, pulse 92, temperature 98.3 F (36.8 C), temperature source Oral, resp. rate 22, height 5\' 7"  (1.702 m), weight 136.079 kg (300 lb), SpO2 88 %.  PHYSICAL EXAMINATION:  GENERAL:  59 y.o.-year-old patient lying in the bed with no acute distress.  EYES: Pupils equal, round, reactive to light and accommodation. No scleral icterus. Extraocular muscles intact.  HEENT: Head atraumatic, normocephalic. Oropharynx and nasopharynx clear.  NECK:  Supple, no jugular venous distention. No thyroid enlargement, no tenderness.  LUNGS: Normal breath sounds bilaterally, no wheezing,  rales,rhonchi or crepitation. No use of accessory muscles of respiration.  CARDIOVASCULAR: S1, S2 normal. No murmurs, rubs, or gallops.  ABDOMEN: Soft, nontender, nondistended. Bowel sounds present. No organomegaly or mass.  EXTREMITIES: No pedal edema, cyanosis, or clubbing.  NEUROLOGIC: Cranial nerves II through XII are intact. Muscle strength 5/5 in all extremities. Sensation intact. Gait not checked.  PSYCHIATRIC: The patient is alert and oriented x 3.  SKIN: No rash, lesion, or ulcer.   LABORATORY PANEL:   CBC  Recent Labs Lab 08/31/14 1721  WBC 15.7*  HGB 10.2*  HCT 30.5*  PLT 203    Chemistries   Recent Labs Lab 08/31/14 1721  NA 129*  K 4.9  CL 95*  CO2 26  GLUCOSE 143*  BUN 25*  CREATININE 1.33*  CALCIUM 7.9*  AST 25  ALT 33  ALKPHOS 60  BILITOT 0.6    Cardiac Enzymes  Recent Labs Lab 08/31/14 1721  TROPONINI <0.03    RADIOLOGY:  Dg Chest 2 View  08/31/2014   CLINICAL DATA:  59 year old female with history of leukemia currently on oral chemotherapy, presenting with productive cough with bloody sputum and fever for the past 5 days. Shortness of breath for the past 2 days.  EXAM: CHEST  2 VIEW  COMPARISON:  Chest x-ray 05/04/2014.  FINDINGS: Extensive multifocal airspace consolidation is noted in the lungs bilaterally, particularly on the right. Perihilar consolidation is noted on the left as well. No definite pleural effusions. No evidence of pulmonary edema. Heart size appears borderline and likely accentuated by low lung volumes, portable AP technique and lordotic positioning. Orthopedic fixation hardware in the lower cervical spine incidentally noted.  IMPRESSION: 1. New multifocal airspace consolidation in the lungs bilaterally, concerning for multilobar pneumonia.   Electronically Signed   By: Vinnie Langton M.D.   On: 08/31/2014 17:45    EKG:   Not done in ER  IMPRESSION AND PLAN:   1. Acute respiratory failure with hypoxia- this is secondary  to pneumonia I will give oxygen supplementation. Add Solu-Medrol with poor air entry 2. Clinical sepsis, bilateral pneumonia, tachypnea, leukocytosis, tachycardia- the ER physician ordered triple antibiotics. Since the patient came from home I will hold off on vancomycin  at this point and just go with Rocephin and Levaquin. Get blood cultures 2. 3. Hyponatremia and dehydration- I will hydrate with IV fluids normal saline hold spironolactone Naprosyn Cozaar and Lasix. 4 type 2 diabetes- sugars will be high on steroids. I will put on sliding scale. And continue the patient's Lantus dose. 5. CML- I will consult Dr. Loni Muse it since he follows as outpatient. 6. Morbid obesity weight loss is needed. 7. Anxiety and depression continue psychiatric medications.  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  All the records are reviewed and case discussed with ED provider. Management plans discussed with the patient, family and they are in agreement.  CODE STATUS: Full code  TOTAL TIME TAKING CARE OF THIS PATIENT: 55 minutes.    Loletha Grayer M.D on 08/31/2014 at 8:10 PM  Between 7am to 6pm - Pager - [phone]  After 6pm call admission pager [facility]  Office  [phone]  CC: Primary care physician; Dr. Salome Holmes at [facility]

## 2014-08-31 NOTE — ED Notes (Signed)
Pt is c/o pain all over, she reports that she got no relief from the fioricet and she is begging for something else for pain and also something for nausea.  Discussed with Dr Leslye Peer - no additional orders at this time but states that he will review

## 2014-09-01 ENCOUNTER — Telehealth: Payer: Self-pay | Admitting: Internal Medicine

## 2014-09-01 DIAGNOSIS — J189 Pneumonia, unspecified organism: Secondary | ICD-10-CM

## 2014-09-01 DIAGNOSIS — C9210 Chronic myeloid leukemia, BCR/ABL-positive, not having achieved remission: Secondary | ICD-10-CM

## 2014-09-01 DIAGNOSIS — I10 Essential (primary) hypertension: Secondary | ICD-10-CM

## 2014-09-01 DIAGNOSIS — Z79899 Other long term (current) drug therapy: Secondary | ICD-10-CM

## 2014-09-01 DIAGNOSIS — E119 Type 2 diabetes mellitus without complications: Secondary | ICD-10-CM

## 2014-09-01 LAB — CBC
HCT: 30.7 % — ABNORMAL LOW (ref 35.0–47.0)
Hemoglobin: 10.1 g/dL — ABNORMAL LOW (ref 12.0–16.0)
MCH: 29 pg (ref 26.0–34.0)
MCHC: 32.8 g/dL (ref 32.0–36.0)
MCV: 88.4 fL (ref 80.0–100.0)
Platelets: 185 10*3/uL (ref 150–440)
RBC: 3.47 MIL/uL — ABNORMAL LOW (ref 3.80–5.20)
RDW: 14 % (ref 11.5–14.5)
WBC: 14.5 10*3/uL — ABNORMAL HIGH (ref 3.6–11.0)

## 2014-09-01 LAB — GLUCOSE, CAPILLARY
GLUCOSE-CAPILLARY: 267 mg/dL — AB (ref 65–99)
GLUCOSE-CAPILLARY: 293 mg/dL — AB (ref 65–99)
GLUCOSE-CAPILLARY: 338 mg/dL — AB (ref 65–99)
Glucose-Capillary: 370 mg/dL — ABNORMAL HIGH (ref 65–99)

## 2014-09-01 LAB — BASIC METABOLIC PANEL
ANION GAP: 9 (ref 5–15)
Anion gap: 7 (ref 5–15)
BUN: 21 mg/dL — ABNORMAL HIGH (ref 6–20)
BUN: 23 mg/dL — AB (ref 6–20)
CHLORIDE: 103 mmol/L (ref 101–111)
CO2: 23 mmol/L (ref 22–32)
CO2: 23 mmol/L (ref 22–32)
CREATININE: 1.39 mg/dL — AB (ref 0.44–1.00)
Calcium: 8.6 mg/dL — ABNORMAL LOW (ref 8.9–10.3)
Calcium: 8.5 mg/dL — ABNORMAL LOW (ref 8.9–10.3)
Chloride: 102 mmol/L (ref 101–111)
Creatinine, Ser: 1.2 mg/dL — ABNORMAL HIGH (ref 0.44–1.00)
GFR calc Af Amer: 56 mL/min — ABNORMAL LOW (ref >60)
GFR calc Af Amer: 47 mL/min — ABNORMAL LOW (ref >60)
GFR calc non Af Amer: 48 mL/min — ABNORMAL LOW (ref >60)
GFR calc non Af Amer: 41 mL/min — ABNORMAL LOW (ref >60)
GLUCOSE: 324 mg/dL — AB (ref 65–99)
Glucose, Bld: 226 mg/dL — ABNORMAL HIGH (ref 65–99)
Potassium: 6.2 mmol/L — ABNORMAL HIGH (ref 3.5–5.1)
Potassium: 5.3 mmol/L — ABNORMAL HIGH (ref 3.5–5.1)
SODIUM: 132 mmol/L — AB (ref 135–145)
Sodium: 135 mmol/L (ref 135–145)

## 2014-09-01 MED ORDER — HYDROMORPHONE HCL 1 MG/ML IJ SOLN
1.0000 mg | INTRAMUSCULAR | Status: DC | PRN
  Administered 2014-09-01 – 2014-09-07 (×28): 1 mg via INTRAVENOUS
  Filled 2014-09-01 (×29): qty 1

## 2014-09-01 MED ORDER — METHYLPREDNISOLONE SODIUM SUCC 125 MG IJ SOLR
60.0000 mg | Freq: Two times a day (BID) | INTRAMUSCULAR | Status: DC
  Administered 2014-09-02 – 2014-09-04 (×5): 60 mg via INTRAVENOUS
  Administered 2014-09-04: 62.5 mg via INTRAVENOUS
  Filled 2014-09-01 (×6): qty 2

## 2014-09-01 MED ORDER — HYDROMORPHONE HCL 1 MG/ML IJ SOLN
2.0000 mg | Freq: Once | INTRAMUSCULAR | Status: AC
Start: 2014-09-01 — End: 2014-09-01
  Administered 2014-09-01: 2 mg via INTRAVENOUS
  Filled 2014-09-01: qty 2

## 2014-09-01 MED ORDER — SODIUM POLYSTYRENE SULFONATE 15 GM/60ML PO SUSP
30.0000 g | Freq: Once | ORAL | Status: AC
  Administered 2014-09-01: 10:00:00 30 g via ORAL
  Filled 2014-09-01: qty 120

## 2014-09-01 MED ORDER — MAGNESIUM HYDROXIDE 400 MG/5ML PO SUSP
30.0000 mL | Freq: Two times a day (BID) | ORAL | Status: AC | PRN
  Administered 2014-09-02 (×2): 30 mL via ORAL
  Filled 2014-09-01 (×2): qty 30

## 2014-09-01 NOTE — Plan of Care (Signed)
Problem: Discharge Progression Outcomes Goal: Discharge plan in place and appropriate Individualization of Care Outcome: Progressing 1. Like to be called Tara Levine 2. Lives with husband 3. Hx of CML, anemia, diabetes, IDA, panic disorder, chronic back pain and allergies controlled by home meds. Goal: Other Discharge Outcomes/Goals Outcome: Progressing Plan of care progress to goal for: 1. Pain-pt c/o pain this shift, prn meds given with some improvement, pt denies further action 2. Hemodynamically-             -VSS, pt remains afebrile this shift             -IV fluids continue 3. Complications-pt complained of not being comfortable in bed, bariatric bed order and will arrive today hopefully 4. Diet-pt tolerating diet at this time 5. Activity-pt up to BR with 1 assist

## 2014-09-01 NOTE — Progress Notes (Signed)
This nurse called to the room by patient.  Pt stating that she is in pain all over and that she cannot get comfortable in bed.  Pt requesting a bariatric bed.  Explained to pt that I would have to get order for new bed from MD and that bed would not be here tonight.  Pt complaining of pain at IV site.  Spoke with Dr. Jannifer Franklin, who put in order for bariatric bed.  Company was contacted and order placed for bed.  Company will call back with an ETA.  Pt was given morphine for pain and benadryl as requested.  Pt's IV was removed and replaced per her request.  Pt very unhappy that bed will not be in tonight and still complaining about bed.  Pt requested that bed be padded with blankets to make more comfortable.  This nurse padded bed with 6 blankets and covered with fitted sheet.  Pt happy with this and stated bed was more comfortable.  Pt still complaining of pain.  Offered pt her tylenol #3.  Pt declined.  Offered to contact MD for something else and pt declined.  Pt resting comfortably in bed at this time.  Will continue to monitor.  Clarise Cruz, RN

## 2014-09-01 NOTE — Consult Note (Signed)
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 She also has h/o other medical issues as detailed below. She also has had issues with weight gain and h/o fluid retention. Recent BCR-ABL study had shown good response to therapy.  States that she is taking Bosulif 300 mg daily on a regular basis. She is admitted with progressive cough, DOE and hemoptysis. CXR s/o b/l pneumonia. Denies fever or chills. Currently feels weak and tired.Chronic back pain and arthritis are unchanged, no new bone pains. Eating well. No nausea, vomiting, or diarrhea.    Review of Systems:   As in HPI above. In addition, currently no fevers or chills. No new headaches, visual symptoms or focal weakness.  No new mood disturbances. No earache or discharge. No sore throat or dysphagia. No chest pain. No abdominal pain, constipation, diarrhea, dysuria or hematuria. No new bone pain. No skin rash or ongoing bleeding symptoms. No polyuria polydipsia.    Allergies:  Tape: Unknown  Latex: Unknown   Past Medical and Surgical History:           CML  Hypertension  Hyperlipidemia  Type II diabetes mellitus  Allergies  Osteoarthritis  Chronic iron deficiency anemia  History of gastric ulcer in the past  Panic disorder  Progressive spinal stenosis  Back and neck surgery x2 (1999, 2009 had rods placed)  Partial hysterectomy at age 39 (denies cancer)    Family History -  Remarkable for diabetes, hypertension, heart disease, prostate cancer.  Denies hematological disorders including leukemia.    Social History -  Remote history of smoking, quit in 1985.  Denies alcohol or recreational drug usage.   Home Medications: Medication Instructions Last Modified Date/Time  furosemide 40 mg oral tablet 1 tab(s) orally once a day  25-Feb-16 11:05  Aldactone 25 mg oral tablet 1 tablet orally once a day 2 times a day. 25-Feb-16 10:35  hydrOXYzine hydrochloride 25 mg oral tablet 1 tablet orally 3 times a day as needed for itching.  25-Feb-16 10:34  Bosulif 100 mg oral tablet Take 3 tablets  (300 mg) orally once a day. 25-Feb-16 10:02  alprazolam 1 mg oral tablet 1 tab(s) orally 3 times a day x 10 days 25-Feb-16 10:02  estradiol 2 mg tablet 1 tab(s) orally once a day  25-Feb-16 10:02  Vitamin B-12 500 mcg oral tablet 1 tab(s) orally once a day 25-Feb-16 10:02  trazodone 150 mg oral tablet 1 tab(s) orally once a day (at bedtime) 25-Feb-16 10:02  losartan 100 mg oral tablet 1 tab(s) orally once a day 25-Feb-16 10:02  multivitamin 1 tab(s) orally once a day 25-Feb-16 10:02  omeprazole 40 mg oral delayed release capsule 1 cap(s) orally once a day prn 25-Feb-16 10:02  Pataday 0.2% ophthalmic solution 1 drop(s) to each eye once a day 25-Feb-16 10:02  fluticasone nasal 50 mcg/inh nasal spray 2 spray(s) nasal once a day 25-Feb-16 10:02  Vitamin C 500 mg oral capsule 3 cap(s) orally once a day 25-Feb-16 10:02  Vitamin B-12 1000 mcg/mL injectable solution 1 dose(s) injectable once a month 25-Feb-16 10:02  Zofran 4 mg oral tablet 1 tab(s) orally every 4 hours  -for Nausea, Vomiting 25-Feb-16 10:02  magnesium citrate /malate/oxide 200 mg 4 cap(s) orally once a day 25-Feb-16 10:02  gabapentin 100 mg oral capsule 1  cap(s) orally once a day 25-Feb-16 10:02  gabapentin 300 mg oral capsule 2 cap(s) orally once a day (at bedtime) 25-Feb-16 10:02  Soma 250 mg oral tablet 1 tab(s) orally once a day (at bedtime) 25-Feb-16 10:02  CeleBREX 100 mg oral capsule 1 cap(s) orally once a day 25-Feb-16 10:02  Cymbalta 20 mg oral delayed release capsule 1 cap(s) orally 3 times a day 25-Feb-16 10:02    Physical Exam:  VITALS:  97.4, 76, 17, 110/72, 93% on RA GENERAL: patient is weak and tired looking, otherwise alert and oriented and in no acute  distress. No icterus or pallor. HEENT: EOMs intact. Oral exam negative for thrush or lesions. No cervical lymphadenopathy. CVS: S1S2, regular rate and rhythm. LUNGS: Bilaterally clear to auscultation, no rhonchi. ABDOMEN: Soft, obese, nontender. No hepatosplenomegaly clinically.  NEURO:  Grossly nonfocal. Cranial nerves are intact.  EXTREMITIES: bilateral mild pedal edema SKIN: no rash MUSCULOSKELETAL: no obvious joint redness or swelling  Laboratory Results:  Cr 1.20, Ca 8.6, WBC 14.5, Hb 10.1, platelets 225  Assessment and Plan: Impression:   1. CML (chronic myelogenous leukemia, chronic phase). Most recent Peripheral blood BCR-ABL assay showed good response to ongoing therapy with Bosulif. Patient however has been admitted with cough, DOE, weakness and hemoptysis and felt to have b/l pneumonia. She is on empiric antibxs with levaquin and rocephin. Agree with ongoing supportive treatment. Will hold Bosulif given above issues and restart once she gets better. Leucocytosis likely from pneumonia. Mild anemia likely from h/o CML and Bosulif effect. 2. Pneumonia - on empiric antibiotics as above. 3. Will continue to follow intermittently as indicated. Patient explained above details, she is agreeable to this plan. Thank you for the referral, please feel free to call me if any additional questions.

## 2014-09-01 NOTE — Progress Notes (Signed)
[facility] [place] NAME: Tara Levine    MR#:  [id number]  DATE OF BIRTH:  [birth year]  SUBJECTIVE:  CHIEF COMPLAINT:   Chief Complaint  Patient presents with  . Fever  cough, SOB, back pain. contipation.  REVIEW OF SYSTEMS:  CONSTITUTIONAL: No fever, generalized weakness.  EYES: No blurred or double vision.  EARS, NOSE, AND THROAT: No tinnitus or ear pain.  RESPIRATORY: has cough and shortness of breath, no wheezing or hemoptysis.  CARDIOVASCULAR: No chest pain, orthopnea, edema.  GASTROINTESTINAL: No nausea, vomiting, diarrhea or abdominal pain. But constipation. GENITOURINARY: No dysuria, hematuria.  ENDOCRINE: No polyuria, nocturia,  HEMATOLOGY: No anemia, easy bruising or bleeding SKIN: No rash or lesion. MUSCULOSKELETAL:  Back pain. NEUROLOGIC: No tingling, numbness, weakness.  PSYCHIATRY: No anxiety or depression.   DRUG ALLERGIES:   Allergies  Allergen Reactions  . Codeine Other (See Comments)    Reaction: Unknown   . Latex Other (See Comments)    Reaction:  Unknown   . Morphine And Related Other (See Comments)    Reaction:  Unknown   . Tape Other (See Comments)    Reaction:  Unknown   . Nsaids Rash    VITALS:  Blood pressure 113/65, pulse 90, temperature 97.7 F (36.5 C), temperature source Oral, resp. rate 24, height 5\' 7"  (1.702 m), weight 146.466 kg (322 lb 14.4 oz), SpO2 91 %.  PHYSICAL EXAMINATION:  GENERAL:  59 y.o.-year-old patient lying in the bed with no acute distress. Morbid obese. EYES: Pupils equal, round, reactive to light and accommodation. No scleral icterus. Extraocular muscles intact.  HEENT: Head atraumatic, normocephalic. Oropharynx and nasopharynx clear.  NECK:  Supple, no jugular venous distention. No thyroid enlargement, no tenderness.  LUNGS: Normal breath sounds bilaterally, no wheezing, rales,rhonchi or crepitation. No use of accessory muscles of respiration.  CARDIOVASCULAR:  S1, S2 normal. No murmurs, rubs, or gallops.  ABDOMEN: Soft, nontender, nondistended. Bowel sounds present. No organomegaly or mass.  EXTREMITIES: No pedal edema, cyanosis, or clubbing.  NEUROLOGIC: Cranial nerves II through XII are intact. Muscle strength 5/5 in all extremities. Sensation intact. Gait not checked.  PSYCHIATRIC: The patient is alert and oriented x 3.  SKIN: No obvious rash, lesion, or ulcer.    LABORATORY PANEL:   CBC  Recent Labs Lab 09/01/14 0452  WBC 14.5*  HGB 10.1*  HCT 30.7*  PLT 185   ------------------------------------------------------------------------------------------------------------------  Chemistries   Recent Labs Lab 08/31/14 1721 09/01/14 0452  NA 129* 132*  K 4.9 6.2*  CL 95* 102  CO2 26 23  GLUCOSE 143* 226*  BUN 25* 21*  CREATININE 1.33* 1.20*  CALCIUM 7.9* 8.6*  AST 25  --   ALT 33  --   ALKPHOS 60  --   BILITOT 0.6  --    ------------------------------------------------------------------------------------------------------------------  Cardiac Enzymes  Recent Labs Lab 08/31/14 1721  TROPONINI <0.03   ------------------------------------------------------------------------------------------------------------------  RADIOLOGY:  Dg Chest 2 View  08/31/2014   CLINICAL DATA:  59 year old female with history of leukemia currently on oral chemotherapy, presenting with productive cough with bloody sputum and fever for the past 5 days. Shortness of breath for the past 2 days.  EXAM: CHEST  2 VIEW  COMPARISON:  Chest x-ray 05/04/2014.  FINDINGS: Extensive multifocal airspace consolidation is noted in the lungs bilaterally, particularly on the right. Perihilar consolidation is noted on the left as well. No definite pleural effusions. No evidence of pulmonary edema. Heart size appears borderline and  likely accentuated by low lung volumes, portable AP technique and lordotic positioning. Orthopedic fixation hardware in the lower cervical  spine incidentally noted.  IMPRESSION: 1. New multifocal airspace consolidation in the lungs bilaterally, concerning for multilobar pneumonia.   Electronically Signed   By: Vinnie Langton M.D.   On: 08/31/2014 17:45    EKG:   Orders placed or performed in visit on 05/18/14  . EKG 12-Lead    ASSESSMENT AND PLAN:   1. Acute respiratory failure with hypoxia Continue [place] oxygen,  Solu-Medrol and NEB.  2. Clinical sepsis, bilateral pneumonia, Continue Rocephin and Levaquin.  Follow up blood cultures and CBC.  3. Hyponatremia and dehydration. Continue normal saline and f/u BMP,  hold spironolactone Naprosyn Cozaar and Lasix.  4.  type 2 diabetes. Continue sliding scale and Lantus 20 units.  5. CML. F/u  Dr. Ma Hillock.  * Hyperkalemia. Kayexalate, f/u BMP today.  * Constipation. Colace and MOM prn.   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  All the records are reviewed and case discussed with Care Management/Social Workerr. Management plans discussed with the patient, her husband and they are in agreement.  CODE STATUS: FULL CODE  TOTAL TIME TAKING CARE OF THIS PATIENT: 46 minutes.   POSSIBLE D/C IN 3 DAYS, DEPENDING ON CLINICAL CONDITION.   Demetrios Loll M.D on 09/01/2014 at 12:01 PM  Between 7am to 6pm - Pager - [phone]  After 6pm go to [email or web address] - password [email or web address] [facility]  Office  [phone]  CC: Primary care physician; Valera Castle, MD

## 2014-09-01 NOTE — Plan of Care (Signed)
Problem: Discharge Progression Outcomes Goal: Other Discharge Outcomes/Goals Outcome: Progressing Pt is alert and oriented x 4 , patient is drowsy throughout shift, c/o chronic back pain with no relief from morphine per pt, MD notified and dilaudid given with relief per patient. Pt continues on 2 L oxygen, good appetite, potassium elevated with improvement from previous lab, kaexelate given. Receiving IV fluids, antibiotics, and steroids. Up to bathroom with stand by assist, refuses bed alarm, bariatric bed provided at patients request, denies n/v. Afebrile throughout shift, vital signs remain stable.

## 2014-09-01 NOTE — Telephone Encounter (Signed)
Called and left message that patient does need to continue on the Bosulif and that it has not been discontinued.

## 2014-09-01 NOTE — Progress Notes (Signed)
Bariatric bed to be delivered within the hour.  Clarise Cruz, RN

## 2014-09-01 NOTE — Progress Notes (Signed)
Inpatient Diabetes Program Recommendations  AACE/ADA: New Consensus Statement on Inpatient Glycemic Control (2013)  Target Ranges:  Prepandial:   less than 140 mg/dL      Peak postprandial:   less than 180 mg/dL (1-2 hours)      Critically ill patients:  140 - 180 mg/dL   Reason for assessment: elevated CBG  Diabetes history: Type 2 Outpatient Diabetes medications: Novolog 15 units tid, Lantus 20 units qday Current orders for Inpatient glycemic control: Lantus 20 units qhs, Novolog 0-9 units tid, Novolog 0-5 units qhs  Please consider increasing Novolog correction to resistant scale; Novolog 0-20 units tid with meals; patient is on steroids  Gentry Fitz, RN, [place], [place], CDE Diabetes Coordinator Inpatient Diabetes Program  [phone] (Team Pager) [phone] ([place]) 09/01/2014 9:32 AM

## 2014-09-01 NOTE — Telephone Encounter (Signed)
Has Bosulif been discontinued? They have been unable to reach patient and they need to know. Please call: [phone] ext.[phone]

## 2014-09-01 NOTE — Progress Notes (Signed)
Spoke with Dr. Reece Levy about pt's pain.  Pt is crying out in pain.  9/10.  Pt's morphine was not due for another 50 minutes.  One time order given for pain meds.  Will continue to assess.  Clarise Cruz, RN

## 2014-09-02 LAB — BASIC METABOLIC PANEL
ANION GAP: 9 (ref 5–15)
Anion gap: 4 — ABNORMAL LOW (ref 5–15)
BUN: 22 mg/dL — AB (ref 6–20)
BUN: 25 mg/dL — ABNORMAL HIGH (ref 6–20)
CALCIUM: 8.1 mg/dL — AB (ref 8.9–10.3)
CHLORIDE: 100 mmol/L — AB (ref 101–111)
CO2: 25 mmol/L (ref 22–32)
CO2: 23 mmol/L (ref 22–32)
CREATININE: 1.32 mg/dL — AB (ref 0.44–1.00)
Calcium: 8.1 mg/dL — ABNORMAL LOW (ref 8.9–10.3)
Chloride: 106 mmol/L (ref 101–111)
Creatinine, Ser: 1.33 mg/dL — ABNORMAL HIGH (ref 0.44–1.00)
GFR calc Af Amer: 50 mL/min — ABNORMAL LOW (ref >60)
GFR calc Af Amer: 50 mL/min — ABNORMAL LOW (ref >60)
GFR calc non Af Amer: 43 mL/min — ABNORMAL LOW (ref >60)
GFR calc non Af Amer: 43 mL/min — ABNORMAL LOW (ref >60)
Glucose, Bld: 264 mg/dL — ABNORMAL HIGH (ref 65–99)
Glucose, Bld: 357 mg/dL — ABNORMAL HIGH (ref 65–99)
Potassium: 5.4 mmol/L — ABNORMAL HIGH (ref 3.5–5.1)
Potassium: 5.2 mmol/L — ABNORMAL HIGH (ref 3.5–5.1)
Sodium: 135 mmol/L (ref 135–145)
Sodium: 132 mmol/L — ABNORMAL LOW (ref 135–145)

## 2014-09-02 LAB — CBC
HEMATOCRIT: 28.6 % — AB (ref 35.0–47.0)
Hemoglobin: 9.4 g/dL — ABNORMAL LOW (ref 12.0–16.0)
MCH: 29.4 pg (ref 26.0–34.0)
MCHC: 33 g/dL (ref 32.0–36.0)
MCV: 89.4 fL (ref 80.0–100.0)
Platelets: 221 10*3/uL (ref 150–440)
RBC: 3.2 MIL/uL — AB (ref 3.80–5.20)
RDW: 13.9 % (ref 11.5–14.5)
WBC: 12.6 10*3/uL — ABNORMAL HIGH (ref 3.6–11.0)

## 2014-09-02 LAB — GLUCOSE, CAPILLARY
GLUCOSE-CAPILLARY: 281 mg/dL — AB (ref 65–99)
Glucose-Capillary: 256 mg/dL — ABNORMAL HIGH (ref 65–99)
Glucose-Capillary: 412 mg/dL — ABNORMAL HIGH (ref 65–99)
Glucose-Capillary: 262 mg/dL — ABNORMAL HIGH (ref 65–99)

## 2014-09-02 LAB — URINE CULTURE

## 2014-09-02 MED ORDER — INSULIN GLARGINE 100 UNIT/ML ~~LOC~~ SOLN
30.0000 [IU] | Freq: Every day | SUBCUTANEOUS | Status: DC
  Administered 2014-09-03: 11:00:00 30 [IU] via SUBCUTANEOUS
  Filled 2014-09-02 (×2): qty 0.3

## 2014-09-02 MED ORDER — BISACODYL 10 MG RE SUPP
10.0000 mg | Freq: Once | RECTAL | Status: AC
  Administered 2014-09-02: 17:00:00 10 mg via RECTAL
  Filled 2014-09-02: qty 1

## 2014-09-02 MED ORDER — SODIUM POLYSTYRENE SULFONATE 15 GM/60ML PO SUSP
30.0000 g | Freq: Once | ORAL | Status: AC
  Administered 2014-09-02: 10:00:00 30 g via ORAL
  Filled 2014-09-02: qty 120

## 2014-09-02 MED ORDER — INSULIN ASPART 100 UNIT/ML ~~LOC~~ SOLN
15.0000 [IU] | Freq: Once | SUBCUTANEOUS | Status: AC
  Administered 2014-09-02: 15 [IU] via SUBCUTANEOUS
  Filled 2014-09-02: qty 15

## 2014-09-02 MED ORDER — INSULIN ASPART 100 UNIT/ML ~~LOC~~ SOLN
0.0000 [IU] | Freq: Every day | SUBCUTANEOUS | Status: DC
  Administered 2014-09-02: 3 [IU] via SUBCUTANEOUS
  Administered 2014-09-04: 2 [IU] via SUBCUTANEOUS
  Filled 2014-09-02: qty 2
  Filled 2014-09-02: qty 3
  Filled 2014-09-02: qty 2

## 2014-09-02 MED ORDER — INSULIN ASPART 100 UNIT/ML ~~LOC~~ SOLN
0.0000 [IU] | Freq: Three times a day (TID) | SUBCUTANEOUS | Status: DC
  Administered 2014-09-03 (×3): 5 [IU] via SUBCUTANEOUS
  Administered 2014-09-04: 08:00:00 8 [IU] via SUBCUTANEOUS
  Administered 2014-09-04: 5 [IU] via SUBCUTANEOUS
  Administered 2014-09-04: 8 [IU] via SUBCUTANEOUS
  Administered 2014-09-05: 17:00:00 3 [IU] via SUBCUTANEOUS
  Administered 2014-09-05 (×2): 2 [IU] via SUBCUTANEOUS
  Administered 2014-09-06: 17:00:00 5 [IU] via SUBCUTANEOUS
  Administered 2014-09-06 – 2014-09-07 (×2): 2 [IU] via SUBCUTANEOUS
  Filled 2014-09-02: qty 8
  Filled 2014-09-02 (×2): qty 5
  Filled 2014-09-02: qty 2
  Filled 2014-09-02: qty 8
  Filled 2014-09-02: qty 2
  Filled 2014-09-02: qty 5
  Filled 2014-09-02: qty 2
  Filled 2014-09-02: qty 3
  Filled 2014-09-02: qty 5
  Filled 2014-09-02: qty 2
  Filled 2014-09-02: qty 5

## 2014-09-02 NOTE — Progress Notes (Signed)
Inpatient Diabetes Program Recommendations  AACE/ADA: New Consensus Statement on Inpatient Glycemic Control (2013)  Target Ranges:  Prepandial:   less than 140 mg/dL      Peak postprandial:   less than 180 mg/dL (1-2 hours)      Critically ill patients:  140 - 180 mg/dL   Results for Tara Levine, Tara Levine (MRN [id number]) as of 09/02/2014 11:29  Ref. Range 09/01/2014 17:08 09/01/2014 21:21 09/01/2014 23:45 09/02/2014 07:23 09/02/2014 11:06  Glucose-Capillary Latest Ref Range: 65-99 mg/dL 293 (H) 370 (H) 338 (H) 256 (H) 281 (H)   Reason for assessment: elevated CBG  Diabetes history: Type 2 Outpatient Diabetes medications: Novolog 15 units tid, Lantus 20 units qday Current orders for Inpatient glycemic control: Lantus 20 units qhs, Novolog 0-9 units tid, Novolog 0-5 units qhs  Please consider increasing the Novolog correction to the resistant scale; Novolog 0-20 units tid with meals; patient is on steroids Consider increasing the Lantus to 30 units qday (0.2units/kg)- fasting blood sugar 256mg /dl today Consider ordering a HgA1C to determine overall blood sugar control.   Gentry Fitz, RN, BA, MHA, CDE Diabetes Coordinator Inpatient Diabetes Program  [phone] (Team Pager) [phone] ([place]) 09/02/2014 11:33 AM

## 2014-09-02 NOTE — Progress Notes (Signed)
[place] at [place] NAME: Tara Levine    MR#:  [id number]  DATE OF BIRTH:  [birth year]  SUBJECTIVE:  CHIEF COMPLAINT:   Chief Complaint  Patient presents with  . Fever  still cough, SOB, back pain. contipation.  REVIEW OF SYSTEMS:  CONSTITUTIONAL: No fever, generalized weakness.  EYES: No blurred or double vision.  EARS, NOSE, AND THROAT: No tinnitus or ear pain.  RESPIRATORY: has cough and shortness of breath, no wheezing or hemoptysis.  CARDIOVASCULAR: No chest pain, orthopnea, edema.  GASTROINTESTINAL: No nausea, vomiting, diarrhea or abdominal pain. But constipation. GENITOURINARY: No dysuria, hematuria.  ENDOCRINE: No polyuria, nocturia,  HEMATOLOGY: No anemia, easy bruising or bleeding SKIN: No rash or lesion. MUSCULOSKELETAL:  Back pain. NEUROLOGIC: No tingling, numbness, weakness.  PSYCHIATRY: No anxiety or depression.   DRUG ALLERGIES:   Allergies  Allergen Reactions  . Codeine Other (See Comments)    Reaction: Unknown   . Latex Other (See Comments)    Reaction:  Unknown   . Morphine And Related Other (See Comments)    Reaction:  Unknown   . Tape Other (See Comments)    Reaction:  Unknown   . Nsaids Rash    VITALS:  Blood pressure 112/56, pulse 76, temperature 97.7 F (36.5 C), temperature source Oral, resp. rate 20, height 5\' 7"  (1.702 m), weight 146.466 kg (322 lb 14.4 oz), SpO2 93 %.  PHYSICAL EXAMINATION:  GENERAL:  59 y.o.-year-old patient lying in the bed with no acute distress. Morbid obese. EYES: Pupils equal, round, reactive to light and accommodation. No scleral icterus. Extraocular muscles intact.  HEENT: Head atraumatic, normocephalic. Oropharynx and nasopharynx clear.  NECK:  Supple, no jugular venous distention. No thyroid enlargement, no tenderness.  LUNGS: Normal breath sounds bilaterally, no wheezing, rales,rhonchi or crepitation. No use of accessory muscles of respiration.   CARDIOVASCULAR: S1, S2 normal. No murmurs, rubs, or gallops.  ABDOMEN: Soft, nontender, nondistended. Bowel sounds present. No organomegaly or mass.  EXTREMITIES: No pedal edema, cyanosis, or clubbing.  NEUROLOGIC: Cranial nerves II through XII are intact. Muscle strength 5/5 in all extremities. Sensation intact. Gait not checked.  PSYCHIATRIC: The patient is alert and oriented x 3.  SKIN: No obvious rash, lesion, or ulcer.    LABORATORY PANEL:   CBC  Recent Labs Lab 09/02/14 0442  WBC 12.6*  HGB 9.4*  HCT 28.6*  PLT 221   ------------------------------------------------------------------------------------------------------------------  Chemistries   Recent Labs Lab 08/31/14 1721  09/02/14 1157  NA 129*  < > 132*  K 4.9  < > 5.2*  CL 95*  < > 100*  CO2 26  < > 23  GLUCOSE 143*  < > 357*  BUN 25*  < > 25*  CREATININE 1.33*  < > 1.32*  CALCIUM 7.9*  < > 8.1*  AST 25  --   --   ALT 33  --   --   ALKPHOS 60  --   --   BILITOT 0.6  --   --   < > = values in this interval not displayed. ------------------------------------------------------------------------------------------------------------------  Cardiac Enzymes  Recent Labs Lab 08/31/14 1721  TROPONINI <0.03   ------------------------------------------------------------------------------------------------------------------  RADIOLOGY:  Dg Chest 2 View  08/31/2014   CLINICAL DATA:  59 year old female with history of leukemia currently on oral chemotherapy, presenting with productive cough with bloody sputum and fever for the past 5 days. Shortness of breath for the past 2 days.  EXAM: CHEST  2  VIEW  COMPARISON:  Chest x-ray 05/04/2014.  FINDINGS: Extensive multifocal airspace consolidation is noted in the lungs bilaterally, particularly on the right. Perihilar consolidation is noted on the left as well. No definite pleural effusions. No evidence of pulmonary edema. Heart size appears borderline and likely  accentuated by low lung volumes, portable AP technique and lordotic positioning. Orthopedic fixation hardware in the lower cervical spine incidentally noted.  IMPRESSION: 1. New multifocal airspace consolidation in the lungs bilaterally, concerning for multilobar pneumonia.   Electronically Signed   By: Vinnie Langton M.D.   On: 08/31/2014 17:45    EKG:   Orders placed or performed in visit on 05/18/14  . EKG 12-Lead    ASSESSMENT AND PLAN:   1. Acute respiratory failure with hypoxia Continue [place] oxygen, taper Solu-Medrol and NEB.  2. Clinical sepsis, bilateral pneumonia, ContinueLevaquin.  Follow up blood cultures and CBC.  3. Hyponatremia and ARF/dehydration. Continue normal saline and f/u BMP,  hold spironolactone, Naprosyn,  Cozaar and Lasix.  4.  type 2 diabetes. Uncontrolled.  Increase to moderate sliding scale and increase Lantus from 20 units to 30 units.  5. CML. F/u  Dr. Ma Hillock.  * Hyperkalemia. Kayexalate one dose again this am, f/u BMP today.  * Constipation. Colace and MOM prn.   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  All the records are reviewed and case discussed with Care Management/Social Workerr. Management plans discussed with the patient, her husband and they are in agreement.  CODE STATUS: FULL CODE  TOTAL TIME TAKING CARE OF THIS PATIENT: 42  minutes.   POSSIBLE D/C IN 3 DAYS, DEPENDING ON CLINICAL CONDITION.   Demetrios Loll M.D on 09/02/2014 at 12:34 PM  Between 7am to 6pm - Pager - [phone]  After 6pm go to [email or web address] - password [email or web address] [facility]  Office  [phone]  CC: Primary care physician; Valera Castle, MD

## 2014-09-02 NOTE — Plan of Care (Signed)
Problem: Discharge Progression Outcomes Goal: Other Discharge Outcomes/Goals Outcome: Progressing Pt is alert and oriented x 4, pt c/o chronic back pain improved with dilaudid, pt experiencing anxiety improved with xanax, pt is up to bathroom with stand by assist, receiving iv fluids and iv antibiotics, minimum d.o.e, continues on 2 l oxygen, wbc count decreasing, afebrile throughout shift, good appetite, potassium remains elevated, kaexelate and milk of magnesium given without a bm. Suppository given once for constipation, fsbs elevated throughout shift, insulin coverage provided.

## 2014-09-02 NOTE — Progress Notes (Signed)
Notify MD that fsbs is 412 and pt would like suppository, notified chen via telephone. Per MD, give 15 units novolog now, suppository once.

## 2014-09-02 NOTE — Plan of Care (Signed)
Problem: Discharge Progression Outcomes Goal: Activity appropriate for discharge plan Outcome: Progressing Plan of Care Progress to Goals:  Pt with c/o back, shoulder, leg and head pain this shift. IV dilaudid given with relief noted. Pt with c/o nausea. IV zofran given with relief noted. Pt continues to refuse bed alarm. Up with one assist to BR. O2 continues at 2L. Sats in 90's. VSS.

## 2014-09-02 NOTE — Progress Notes (Signed)
   09/02/14 1000  Clinical Encounter Type  Visited With Patient  Visit Type Initial  Consult/Referral To Chaplain  Provided pastoral care and presence to patient.  She said she was doing ok and hoped to feel better soon.  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  I wished her and her husband a good day.  [place] [phone]

## 2014-09-03 LAB — CBC
HCT: 29.1 % — ABNORMAL LOW (ref 35.0–47.0)
Hemoglobin: 9.7 g/dL — ABNORMAL LOW (ref 12.0–16.0)
MCH: 29.6 pg (ref 26.0–34.0)
MCHC: 33.4 g/dL (ref 32.0–36.0)
MCV: 88.6 fL (ref 80.0–100.0)
PLATELETS: 227 10*3/uL (ref 150–440)
RBC: 3.28 MIL/uL — AB (ref 3.80–5.20)
RDW: 13.8 % (ref 11.5–14.5)
WBC: 12.8 10*3/uL — AB (ref 3.6–11.0)

## 2014-09-03 LAB — BASIC METABOLIC PANEL
Anion gap: 7 (ref 5–15)
BUN: 26 mg/dL — ABNORMAL HIGH (ref 6–20)
CALCIUM: 8.2 mg/dL — AB (ref 8.9–10.3)
CO2: 25 mmol/L (ref 22–32)
CREATININE: 1.23 mg/dL — AB (ref 0.44–1.00)
Chloride: 101 mmol/L (ref 101–111)
GFR calc non Af Amer: 47 mL/min — ABNORMAL LOW (ref >60)
GFR, EST AFRICAN AMERICAN: 55 mL/min — AB (ref >60)
Glucose, Bld: 242 mg/dL — ABNORMAL HIGH (ref 65–99)
Potassium: 4.8 mmol/L (ref 3.5–5.1)
Sodium: 133 mmol/L — ABNORMAL LOW (ref 135–145)

## 2014-09-03 LAB — GLUCOSE, CAPILLARY
GLUCOSE-CAPILLARY: 217 mg/dL — AB (ref 65–99)
GLUCOSE-CAPILLARY: 238 mg/dL — AB (ref 65–99)
Glucose-Capillary: 222 mg/dL — ABNORMAL HIGH (ref 65–99)
Glucose-Capillary: 161 mg/dL — ABNORMAL HIGH (ref 65–99)

## 2014-09-03 MED ORDER — INSULIN GLARGINE 100 UNIT/ML ~~LOC~~ SOLN
37.0000 [IU] | Freq: Every day | SUBCUTANEOUS | Status: DC
  Administered 2014-09-04 – 2014-09-07 (×4): 37 [IU] via SUBCUTANEOUS
  Filled 2014-09-03 (×5): qty 0.37

## 2014-09-03 MED ORDER — SODIUM CHLORIDE 0.9 % IJ SOLN
3.0000 mL | INTRAMUSCULAR | Status: DC | PRN
  Administered 2014-09-03 – 2014-09-04 (×5): 3 mL via INTRAVENOUS
  Filled 2014-09-03 (×5): qty 10

## 2014-09-03 MED ORDER — SODIUM CHLORIDE 0.9 % IJ SOLN
3.0000 mL | Freq: Two times a day (BID) | INTRAMUSCULAR | Status: DC
  Administered 2014-09-03 – 2014-09-07 (×9): 3 mL via INTRAVENOUS

## 2014-09-03 MED ORDER — PREGABALIN 75 MG PO CAPS
150.0000 mg | ORAL_CAPSULE | Freq: Two times a day (BID) | ORAL | Status: DC
  Administered 2014-09-03 – 2014-09-07 (×8): 150 mg via ORAL
  Filled 2014-09-03 (×8): qty 2

## 2014-09-03 NOTE — Progress Notes (Addendum)
[place] at [place] NAME: Tara Levine    MR#:  [id number]  DATE OF BIRTH:  07-Sep-1955  SUBJECTIVE:  CHIEF COMPLAINT:   Chief Complaint  Patient presents with  . Fever  still coughs, SOB, back pain. contipation. Feels unhappy and short of breath. Complains of shortness of breath which is chronic and , wants oxygen therapy at home. Interested in CT scanning of her chest since she is worried about some kind of cancer. Has multiple complains  REVIEW OF SYSTEMS:  CONSTITUTIONAL: No fever, generalized weakness.  EYES: No blurred or double vision.  EARS, NOSE, AND THROAT: No tinnitus or ear pain.  RESPIRATORY: has cough and shortness of breath, no wheezing or hemoptysis.  CARDIOVASCULAR: No chest pain, orthopnea, edema.  GASTROINTESTINAL: No nausea, vomiting, diarrhea or abdominal pain. But constipation. GENITOURINARY: No dysuria, hematuria.  ENDOCRINE: No polyuria, nocturia,  HEMATOLOGY: No anemia, easy bruising or bleeding SKIN: No rash or lesion. MUSCULOSKELETAL:  Back pain. NEUROLOGIC: No tingling, numbness, weakness.  PSYCHIATRY: No anxiety or depression.   DRUG ALLERGIES:   Allergies  Allergen Reactions  . Codeine Other (See Comments)    Reaction: Unknown   . Latex Other (See Comments)    Reaction:  Unknown   . Morphine And Related Other (See Comments)    Reaction:  Unknown   . Tape Other (See Comments)    Reaction:  Unknown   . Nsaids Rash    VITALS:  Blood pressure 105/61, pulse 74, temperature 97.6 F (36.4 C), temperature source Oral, resp. rate 17, height 5\' 7"  (1.702 m), weight 146.466 kg (322 lb 14.4 oz), SpO2 94 %.  PHYSICAL EXAMINATION:  GENERAL:  59 y.o.-year-old patient lying in the bed with no acute distress. Morbid obese. Difficult to move from side to side in bed EYES: Pupils equal, round, reactive to light and accommodation. No scleral icterus. Extraocular muscles intact.  HEENT: Head atraumatic,  normocephalic. Oropharynx and nasopharynx clear.  NECK:  Supple, no jugular venous distention. No thyroid enlargement, no tenderness.  LUNGS: Normal breath sounds bilaterally, no wheezing, rales,rhonchi or crepitation. No use of accessory muscles of respiration.  CARDIOVASCULAR: S1, S2 normal. No murmurs, rubs, or gallops.  ABDOMEN: Soft, nontender, nondistended. Bowel sounds present. No organomegaly or mass.  EXTREMITIES: Trace calve and pedal edema, no cyanosis, or clubbing.  NEUROLOGIC: Cranial nerves II through XII are intact. Muscle strength 5/5 in all extremities. Sensation intact. Gait not checked.  PSYCHIATRIC: The patient is alert and oriented x 3.  SKIN: No obvious rash, lesion, or ulcer.    LABORATORY PANEL:   CBC  Recent Labs Lab 09/03/14 0454  WBC 12.8*  HGB 9.7*  HCT 29.1*  PLT 227   ------------------------------------------------------------------------------------------------------------------  Chemistries   Recent Labs Lab 08/31/14 1721  09/03/14 0454  NA 129*  < > 133*  K 4.9  < > 4.8  CL 95*  < > 101  CO2 26  < > 25  GLUCOSE 143*  < > 242*  BUN 25*  < > 26*  CREATININE 1.33*  < > 1.23*  CALCIUM 7.9*  < > 8.2*  AST 25  --   --   ALT 33  --   --   ALKPHOS 60  --   --   BILITOT 0.6  --   --   < > = values in this interval not displayed. ------------------------------------------------------------------------------------------------------------------  Cardiac Enzymes  Recent Labs Lab 08/31/14 1721  TROPONINI <0.03   ------------------------------------------------------------------------------------------------------------------  RADIOLOGY:  No results found.  EKG:   Orders placed or performed in visit on 05/18/14  . EKG 12-Lead    ASSESSMENT AND PLAN:   1. Acute respiratory failure with hypoxia Continue [place] oxygen, taper Solu-Medrol and NEB. , overall improving and now is on 2 L of oxygen through nasal cannula  2. Clinical sepsis,  bilateral pneumonia, Continue Levaquin.  Negative one blood culture. Urine culture was taken and it revealed multiple species, likely contamination. Sputum cultures are pending.   3. Hyponatremia and ARF/dehydration. Continue normal saline and f/u BMP,  holding spironolactone, Naprosyn,  Cozaar and Lasix. Some improvement from few days ago, but suspect chronic renal insufficiency  4.  type 2 diabetes. Uncontrolled.  Increased to moderate sliding scale and Lantus from 20 units to 30 units, still blood glucose levels remain between 200s to 400s.   5. CML. F/u  Dr. Ma Hillock, patient request. Dr.Pandit's consultation, wonders about CT scan of the chest.  * Hyperkalemia. Kayexalate given yesterday, potassium has improved. Follow BMP today.  * Constipation. Colace and MOM prn.   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  All the records are reviewed and case discussed with Care Management/Social Workerr. Management plans discussed with the patient, her husband and they are in agreement.  CODE STATUS: FULL CODE  TOTAL TIME TAKING CARE OF THIS PATIENT: 50 minutes.  Prolonged discussion about patient's medical condition and her treatment plan and discharge planning, all questions answered, voiced understanding and appreciation, emotional support provided. Time spent additional 15 minutes POSSIBLE D/C IN 3 DAYS, DEPENDING ON CLINICAL CONDITION.   Theodoro Grist M.D on 09/03/2014 at 12:01 PM  Between 7am to 6pm - Pager - [phone]  After 6pm go to [email or web address] - password [email or web address] [facility]  Office  [phone]  CC: Primary care physician; Valera Castle, MD

## 2014-09-03 NOTE — Plan of Care (Signed)
Problem: Discharge Progression Outcomes Goal: Other Discharge Outcomes/Goals Outcome: Progressing Progress to goals: 1. Plans to return home upon discharge. 2. Reports chronic pain improved with pain medication use- reports pain "usually a nine-10" with hydromorphine and fioricet given for HA and chronic back/joint pain with improvement observed. 3. VSS. Labs improved. No respiratory issues. Sputum cup placed at bedside with pt educated- no sputum production this shift. 4. No complications. 5.  Up to BR only- remained otherwise at bedrest. Napped at intervals.

## 2014-09-03 NOTE — Plan of Care (Signed)
Problem: Discharge Progression Outcomes Goal: Other Discharge Outcomes/Goals Afebrile, BP/HR stable. 2L O2 per [place] continues. Dilaudid given x2 for back/shoulders/hips pain with improvement. Ativan given once for anxiety. Refuses bed alarm .Rested well.

## 2014-09-04 LAB — CBC
HCT: 29.7 % — ABNORMAL LOW (ref 35.0–47.0)
Hemoglobin: 9.9 g/dL — ABNORMAL LOW (ref 12.0–16.0)
MCH: 29.7 pg (ref 26.0–34.0)
MCHC: 33.1 g/dL (ref 32.0–36.0)
MCV: 89.5 fL (ref 80.0–100.0)
PLATELETS: 207 10*3/uL (ref 150–440)
RBC: 3.32 MIL/uL — AB (ref 3.80–5.20)
RDW: 14 % (ref 11.5–14.5)
WBC: 9.5 10*3/uL (ref 3.6–11.0)

## 2014-09-04 LAB — GLUCOSE, CAPILLARY
GLUCOSE-CAPILLARY: 204 mg/dL — AB (ref 65–99)
GLUCOSE-CAPILLARY: 269 mg/dL — AB (ref 65–99)
GLUCOSE-CAPILLARY: 242 mg/dL — AB (ref 65–99)
Glucose-Capillary: 259 mg/dL — ABNORMAL HIGH (ref 65–99)

## 2014-09-04 LAB — BASIC METABOLIC PANEL
Anion gap: 6 (ref 5–15)
BUN: 27 mg/dL — ABNORMAL HIGH (ref 6–20)
CO2: 28 mmol/L (ref 22–32)
CREATININE: 1.24 mg/dL — AB (ref 0.44–1.00)
Calcium: 9.3 mg/dL (ref 8.9–10.3)
Chloride: 99 mmol/L — ABNORMAL LOW (ref 101–111)
GFR calc non Af Amer: 47 mL/min — ABNORMAL LOW (ref >60)
GFR, EST AFRICAN AMERICAN: 54 mL/min — AB (ref >60)
Glucose, Bld: 217 mg/dL — ABNORMAL HIGH (ref 65–99)
Potassium: 5.4 mmol/L — ABNORMAL HIGH (ref 3.5–5.1)
Sodium: 133 mmol/L — ABNORMAL LOW (ref 135–145)

## 2014-09-04 LAB — POTASSIUM: Potassium: 4.9 mmol/L (ref 3.5–5.1)

## 2014-09-04 LAB — EXPECTORATED SPUTUM ASSESSMENT W REFEX TO RESP CULTURE

## 2014-09-04 LAB — EXPECTORATED SPUTUM ASSESSMENT W GRAM STAIN, RFLX TO RESP C

## 2014-09-04 MED ORDER — SODIUM POLYSTYRENE SULFONATE 15 GM/60ML PO SUSP
30.0000 g | Freq: Once | ORAL | Status: AC
Start: 2014-09-04 — End: 2014-09-04
  Administered 2014-09-04: 30 g via ORAL
  Filled 2014-09-04 (×2): qty 120

## 2014-09-04 MED ORDER — DULOXETINE HCL 60 MG PO CPEP
60.0000 mg | ORAL_CAPSULE | Freq: Every day | ORAL | Status: DC
  Administered 2014-09-04 – 2014-09-07 (×4): 60 mg via ORAL
  Filled 2014-09-04 (×4): qty 1

## 2014-09-04 MED ORDER — LEVOFLOXACIN 750 MG PO TABS
750.0000 mg | ORAL_TABLET | Freq: Every day | ORAL | Status: AC
  Administered 2014-09-04 – 2014-09-06 (×3): 750 mg via ORAL
  Filled 2014-09-04 (×3): qty 1

## 2014-09-04 MED ORDER — FLUCONAZOLE 100 MG PO TABS
150.0000 mg | ORAL_TABLET | Freq: Once | ORAL | Status: AC
  Administered 2014-09-04: 150 mg via ORAL
  Filled 2014-09-04: qty 1
  Filled 2014-09-04: qty 1.5

## 2014-09-04 MED ORDER — FENTANYL 12 MCG/HR TD PT72
12.5000 ug | MEDICATED_PATCH | TRANSDERMAL | Status: DC
  Administered 2014-09-04: 12.5 ug via TRANSDERMAL
  Filled 2014-09-04: qty 1

## 2014-09-04 MED ORDER — METHYLPREDNISOLONE SODIUM SUCC 40 MG IJ SOLR
40.0000 mg | Freq: Every day | INTRAMUSCULAR | Status: DC
  Administered 2014-09-05: 10:00:00 40 mg via INTRAVENOUS
  Filled 2014-09-04: qty 1

## 2014-09-04 MED ORDER — HYDROMORPHONE HCL 1 MG/ML IJ SOLN
0.5000 mg | Freq: Once | INTRAMUSCULAR | Status: AC
  Administered 2014-09-04: 03:00:00 0.5 mg via INTRAVENOUS
  Filled 2014-09-04: qty 1

## 2014-09-04 NOTE — Progress Notes (Signed)
[place] at [place] NAME: Tara Levine    MR#:  [id number]  DATE OF BIRTH:  [birth year]  SUBJECTIVE:  CHIEF COMPLAINT:   Chief Complaint  Patient presents with  . Fever   some cough, but overall feels improved. Still, back pains, vein graft to initiate fentanyl patch. Complains of itching discomfort in perineal area is concerned about candida infection, requests antifungal medications.   REVIEW OF SYSTEMS:  CONSTITUTIONAL: No fever, generalized weakness.  EYES: No blurred or double vision.  EARS, NOSE, AND THROAT: No tinnitus or ear pain.  RESPIRATORY: has cough and shortness of breath, no wheezing or hemoptysis.  CARDIOVASCULAR: No chest pain, orthopnea, edema.  GASTROINTESTINAL: No nausea, vomiting, diarrhea or abdominal pain. But constipation. GENITOURINARY: No dysuria, hematuria.  ENDOCRINE: No polyuria, nocturia,  HEMATOLOGY: No anemia, easy bruising or bleeding SKIN: No rash or lesion. MUSCULOSKELETAL:  Back pain. NEUROLOGIC: No tingling, numbness, weakness.  PSYCHIATRY: No anxiety or depression.   DRUG ALLERGIES:   Allergies  Allergen Reactions  . Codeine Other (See Comments)    Reaction: Unknown   . Latex Other (See Comments)    Reaction:  Unknown   . Morphine And Related Other (See Comments)    Reaction:  Unknown   . Tape Other (See Comments)    Reaction:  Unknown   . Nsaids Rash    VITALS:  Blood pressure 138/75, pulse 66, temperature 98 F (36.7 C), temperature source Oral, resp. rate 20, height 5\' 7"  (1.702 m), weight 146.466 kg (322 lb 14.4 oz), SpO2 96 %.  PHYSICAL EXAMINATION:  GENERAL:  59 y.o.-year-old patient lying in the bed with no acute distress. Morbid obese. Difficult to move from side to side in bed EYES: Pupils equal, round, reactive to light and accommodation. No scleral icterus. Extraocular muscles intact.  HEENT: Head atraumatic, normocephalic. Oropharynx and nasopharynx clear.  NECK:   Supple, no jugular venous distention. No thyroid enlargement, no tenderness.  LUNGS: Normal breath sounds bilaterally, no wheezing, rales,rhonchi or crepitation. No use of accessory muscles of respiration.  CARDIOVASCULAR: S1, S2 normal. No murmurs, rubs, or gallops.  ABDOMEN: Soft, nontender, nondistended. Bowel sounds present. No organomegaly or mass.  EXTREMITIES: Trace calve and pedal edema, no cyanosis, or clubbing.  NEUROLOGIC: Cranial nerves II through XII are intact. Muscle strength 5/5 in all extremities. Sensation intact. Gait not checked.  PSYCHIATRIC: The patient is alert and oriented x 3.  SKIN: No obvious rash, lesion, or ulcer.    LABORATORY PANEL:   CBC  Recent Labs Lab 09/04/14 0420  WBC 9.5  HGB 9.9*  HCT 29.7*  PLT 207   ------------------------------------------------------------------------------------------------------------------  Chemistries   Recent Labs Lab 08/31/14 1721  09/04/14 0420 09/04/14 1250  NA 129*  < > 133*  --   K 4.9  < > 5.4* 4.9  CL 95*  < > 99*  --   CO2 26  < > 28  --   GLUCOSE 143*  < > 217*  --   BUN 25*  < > 27*  --   CREATININE 1.33*  < > 1.24*  --   CALCIUM 7.9*  < > 9.3  --   AST 25  --   --   --   ALT 33  --   --   --   ALKPHOS 60  --   --   --   BILITOT 0.6  --   --   --   < > =  values in this interval not displayed. ------------------------------------------------------------------------------------------------------------------  Cardiac Enzymes  Recent Labs Lab 08/31/14 1721  TROPONINI <0.03   ------------------------------------------------------------------------------------------------------------------  RADIOLOGY:  No results found.  EKG:   Orders placed or performed in visit on 05/18/14  . EKG 12-Lead    ASSESSMENT AND PLAN:   1. Acute respiratory failure with hypoxia Tapering [place] oxygen, taper steroids and continue NEBs, overall improving and now is on 1-1/2 L of oxygen through nasal  cannula  2. Clinical sepsis, bilateral pneumonia, Continue Levaquin.  Negative one blood culture. Urine culture was taken and it revealed multiple species, likely contamination. Sputum cultures are not reported questionable rejected.   3. Hyponatremia and ARF/dehydration. Continue normal saline and f/u BMP in am,  holding spironolactone, Naprosyn,  Cozaar and Lasix. Some improvement from few days ago, but suspect chronic renal insufficiency  4.  type 2 diabetes. Uncontrolled.  Increased to moderate sliding scale and Lantus from 20 units to 37 units, blood glucose levels around 200s, better than yesterday.   5. CML. F/u  Dr. Ma Hillock, patient request. Dr.Pandit's consultation, wonders about CT scan of the chest.  * Hyperkalemia. Kayexalate given today again, potassium has improved. Following BMP as outpatient.  * Constipation. Colace and MOM prn.  * . Perineal candidiasis per report, initiate patient on Diflucan  Of note, the patient is a Jehovah's Witness and will not take any blood products.  All the records are reviewed and case discussed with Care Management/Social Workerr. Management plans discussed with the patient, her husband and they are in agreement.  CODE STATUS: FULL CODE  TOTAL TIME TAKING CARE OF THIS PATIENT: 35 minutes.      Theodoro Grist M.D on 09/04/2014 at 1:14 PM  Between 7am to 6pm - Pager - [phone]  After 6pm go to [email or web address] - password [email or web address] [facility]  Office  [phone]  CC: Primary care physician; Valera Castle, MD

## 2014-09-04 NOTE — Plan of Care (Signed)
Problem: Discharge Progression Outcomes Goal: Other Discharge Outcomes/Goals Outcome: Progressing Plan of care progress to goals: Discharge plan-Plans to return home upon discharge. Pain- c/o uncontrolled back pain, some improvement noted with PRN IV Dilaudid. Dr. Jannifer Franklin notified of pts c/o uncontrolled pain after ambulation to the BR, one time 0.5mg  IV Dilaudid ordered to help control pain until next PRN dose is available. Hemodynamically stable- VSS. Labs improved. No respiratory issues. Pt is aware that sputum sample is needed and cup is at bedside.  Complications- pt c/o itching X's 2, improvement noted with PRN benadryl. Pt c/o anxiety, crying and emotional, improved with PRN xanax.  Activity- Up to BR only- otherwise bedrest.

## 2014-09-04 NOTE — Plan of Care (Signed)
Problem: Discharge Progression Outcomes Goal: Other Discharge Outcomes/Goals Outcome: Progressing Plan of Care Progress to Goal:  Pt c/o chronic back pain and anxiety.  Pt getting IV pain medication and xanax, but not often enough for her. K+ improved w/kayexelate. Also c/o itching relieved w/PRN benadryl. Sputum specimen accepted. Fentanyl patch added and diflucan for fungal/yeast in perineal area.

## 2014-09-04 NOTE — Progress Notes (Signed)
Dr. Jannifer Franklin notified due to patient c/o back pain 10/10 and next dose PRN pain medication is not due until 0400. New order entered by Dr. Jannifer Franklin. See emar.

## 2014-09-04 NOTE — Progress Notes (Signed)
PHARMACIST - PHYSICIAN COMMUNICATION  CONCERNING: Antibiotic IV to Oral Route Change Policy  RECOMMENDATION: This patient is receiving levofloxacin by the intravenous route.  Based on criteria approved by the Pharmacy and Therapeutics Committee, the antibiotic(s) is/are being converted to the equivalent oral dose form(s).   DESCRIPTION: These criteria include:  Patient being treated for a respiratory tract infection, urinary tract infection, cellulitis or clostridium difficile associated diarrhea if on metronidazole  The patient is not neutropenic and does not exhibit a GI malabsorption state  The patient is eating (either orally or via tube) and/or has been taking other orally administered medications for a least 24 hours  The patient is improving clinically and has a Tmax < 100.5  If you have questions about this conversion, please contact the Pharmacy Department  []   [phone] )  [facility] [x]   [phone] )  [facility] []   [phone] )  [facility] []   [phone] )  [facility] []   [phone] )  [place], PharmD Clinical Pharmacist 09/04/2014

## 2014-09-05 ENCOUNTER — Inpatient Hospital Stay: Payer: Medicaid Other

## 2014-09-05 LAB — HEMOGLOBIN A1C: Hgb A1c MFr Bld: 7.8 % — ABNORMAL HIGH (ref 4.0–6.0)

## 2014-09-05 LAB — CULTURE, BLOOD (ROUTINE X 2): CULTURE: NO GROWTH

## 2014-09-05 LAB — GLUCOSE, CAPILLARY
GLUCOSE-CAPILLARY: 149 mg/dL — AB (ref 65–99)
GLUCOSE-CAPILLARY: 137 mg/dL — AB (ref 65–99)
GLUCOSE-CAPILLARY: 194 mg/dL — AB (ref 65–99)
Glucose-Capillary: 131 mg/dL — ABNORMAL HIGH (ref 65–99)

## 2014-09-05 LAB — POTASSIUM: Potassium: 4.5 mmol/L (ref 3.5–5.1)

## 2014-09-05 MED ORDER — PREDNISONE 10 MG PO TABS: 10.0000 mg | ORAL_TABLET | Freq: Every day | ORAL | Status: DC

## 2014-09-05 MED ORDER — FENTANYL 25 MCG/HR TD PT72
25.0000 ug | MEDICATED_PATCH | TRANSDERMAL | Status: DC
  Administered 2014-09-05: 17:00:00 25 ug via TRANSDERMAL
  Filled 2014-09-05: qty 1

## 2014-09-05 MED ORDER — PREDNISONE 20 MG PO TABS
20.0000 mg | ORAL_TABLET | Freq: Every day | ORAL | Status: AC
  Administered 2014-09-07: 10:00:00 20 mg via ORAL
  Filled 2014-09-05: qty 1

## 2014-09-05 MED ORDER — PREDNISONE 20 MG PO TABS
40.0000 mg | ORAL_TABLET | Freq: Every day | ORAL | Status: AC
  Administered 2014-09-06: 08:00:00 40 mg via ORAL
  Filled 2014-09-05: qty 2

## 2014-09-05 NOTE — Progress Notes (Addendum)
[name] [facility] [place] NAME: Tara Levine    MR#:  [id number]  DATE OF BIRTH:  [birth year]  SUBJECTIVE:  CHIEF COMPLAINT:   Chief Complaint  Patient presents with  . Fever  LESS cough, overall feels improved. Still, significant lumbar back pains. States that she cannot even walk because of seen because of pains in the lower back. Lungs are improving, less cough or phlegm production, although admits of having some yellowish to grayish-looking sputum. Being weaned off oxygen. Admits of headaches. Very emotional and wants to have CT scan of your chest done to rule out mass or cancer   REVIEW OF SYSTEMS:  CONSTITUTIONAL: No fever, generalized weakness.  EYES: No blurred or double vision.  EARS, NOSE, AND THROAT: No tinnitus or ear pain.  RESPIRATORY: has cough and shortness of breath, no wheezing or hemoptysis.  CARDIOVASCULAR: No chest pain, orthopnea, edema.  GASTROINTESTINAL: No nausea, vomiting, diarrhea or abdominal pain. But constipation. GENITOURINARY: No dysuria, hematuria.  ENDOCRINE: No polyuria, nocturia,  HEMATOLOGY: No anemia, easy bruising or bleeding SKIN: No rash or lesion. MUSCULOSKELETAL:  Back pain. NEUROLOGIC: No tingling, numbness, weakness.  PSYCHIATRY: No anxiety or depression.   DRUG ALLERGIES:   Allergies  Allergen Reactions  . Codeine Other (See Comments)    Reaction: Unknown   . Latex Other (See Comments)    Reaction:  Unknown   . Morphine And Related Other (See Comments)    Reaction:  Unknown   . Tape Other (See Comments)    Reaction:  Unknown   . Nsaids Rash    VITALS:  Blood pressure 118/65, pulse 72, temperature 98.6 F (37 C), temperature source Oral, resp. rate 20, height 5\' 7"  (1.702 m), weight 146.466 kg (322 lb 14.4 oz), SpO2 95 %.  PHYSICAL EXAMINATION:  GENERAL:  59 y.o.-year-old patient lying in the bed with no acute distress. Morbid obese. Difficult to move from side to side in  bed EYES: Pupils equal, round, reactive to light and accommodation. No scleral icterus. Extraocular muscles intact.  HEENT: Head atraumatic, normocephalic. Oropharynx and nasopharynx clear.  NECK:  Supple, no jugular venous distention. No thyroid enlargement, no tenderness.  LUNGS: Normal breath sounds bilaterally, no wheezing, rales,rhonchi or crepitation. No use of accessory muscles of respiration.  CARDIOVASCULAR: S1, S2 normal. No murmurs, rubs, or gallops.  ABDOMEN: Soft, nontender, nondistended. Bowel sounds present. No organomegaly or mass.  EXTREMITIES: Trace calve and pedal edema, no cyanosis, or clubbing.  NEUROLOGIC: Cranial nerves II through XII are intact. Muscle strength 5/5 in all extremities. Sensation intact. Gait not checked.  PSYCHIATRIC: The patient is alert and oriented x 3.  SKIN: No obvious rash, lesion, or ulcer.    LABORATORY PANEL:   CBC  Recent Labs Lab 09/04/14 0420  WBC 9.5  HGB 9.9*  HCT 29.7*  PLT 207   ------------------------------------------------------------------------------------------------------------------  Chemistries   Recent Labs Lab 08/31/14 1721  09/04/14 0420 09/04/14 1250  NA 129*  < > 133*  --   K 4.9  < > 5.4* 4.9  CL 95*  < > 99*  --   CO2 26  < > 28  --   GLUCOSE 143*  < > 217*  --   BUN 25*  < > 27*  --   CREATININE 1.33*  < > 1.24*  --   CALCIUM 7.9*  < > 9.3  --   AST 25  --   --   --   ALT  33  --   --   --   ALKPHOS 60  --   --   --   BILITOT 0.6  --   --   --   < > = values in this interval not displayed. ------------------------------------------------------------------------------------------------------------------  Cardiac Enzymes  Recent Labs Lab 08/31/14 1721  TROPONINI <0.03   ------------------------------------------------------------------------------------------------------------------  RADIOLOGY:  No results found.  EKG:   Orders placed or performed in visit on 05/18/14  . EKG 12-Lead     ASSESSMENT AND PLAN:   1. Acute respiratory failure with hypoxia Weaning off [place] oxygen, tapering steroids and continue NEBs 2. Clinical sepsis, bilateral pneumonia, Continue Levaquin.  Negative one blood culture. Urine culture was taken and it revealed multiple species, likely contamination. Sputum culture is pending. CT scan of chest without contrast to rule out mass and evaluate pneumonia 3. Hyponatremia and ARF/dehydration. Continue normal saline and f/u BMP in am,  holding spironolactone, Naprosyn,  Cozaar and Lasix. Some improvement from few days ago, but suspect chronic renal insufficiency, repeated today  4.  type 2 diabetes. Uncontrolled.  Increased to moderate sliding scale and Lantus from 20 units to 37 units, blood glucose levels 137 today in the morning.   5. CML. F/u  Dr. Ma Hillock, patient request. Dr.Pandit's consultation, wonders about CT scan of the chest, which we are going to get.  * Hyperkalemia. Kayexalate given , potassium has improved. Following BMP as outpatient. May not be able to use Cozaar or spironolactone due to hyperkalemia  * Constipation. Colace and MOM prn.  * . Perineal candidiasis per report, given 150 mg of  Diflucan yesterday  *. Back pain, get MRI of the back, lumbar spine to rule out acute event to physical therapy if no acute findings, we discharged to skilled nursing facility for rehabilitation the next few days after the after MRI is done ichronic changes  Of note, the patient is a Jehovah's Witness and will not take any blood products.  All the records are reviewed and case discussed with Care Management/Social Workerr. Management plans discussed with the patient, her husband and they are in agreement.  CODE STATUS: FULL CODE  TOTAL TIME TAKING CARE OF THIS PATIENT: 40 minutes.   Patient is very emotional, crying during my evaluation, tried to reassure her,  answered all her questions,  emotional support provided additional time spent  approximately 20 more minutes   Alyx Mcguirk M.D on 09/05/2014 at 1:51 PM  Between 7am to 6pm - Pager - [phone]  After 6pm go to [email or web address] - password [email or web address] [facility]  Office  [phone]  CC: Primary care physician; Valera [place], MD

## 2014-09-05 NOTE — Plan of Care (Signed)
Problem: Discharge Progression Outcomes Goal: Other Discharge Outcomes/Goals Outcome: Progressing Plan of Care Progress to Goal:  As far as Respiratory issues (admitting diagnosis), much improvement.  Pt is off of O2.  Lung sounds are clear. Still c/o intense back pain - never fully relieved by pain meds.  New fentanyl patch ordered. Also c/o nausea and headache. Dr ordered CT of chest b/c pt is concerned abt spots on lungs.  Also going to do an MRI of back.  Dr V also wants her to participate in sleep study.  K+ check and WNL. PT eval.

## 2014-09-05 NOTE — Plan of Care (Signed)
Plan of Care Progress to Goal:  CT of chest results shows areas of airspace consolidation in the R middle and LLL w/patchy atelectic change and ground glass opacity bilaterally.  Overall sig more ground glass opacity and atelectic change on R side compared to L.  Most consistent w/multifocal infectious PNA.

## 2014-09-05 NOTE — Plan of Care (Signed)
Problem: Discharge Progression Outcomes Goal: Other Discharge Outcomes/Goals Outcome: Progressing Plan of care progress to goals:  VSS. Alprazolam given once for anxiety once with good effect. C/o pain once, dilaudid given with improvement. Pt had a large, soft BM per pt.

## 2014-09-05 NOTE — Progress Notes (Signed)
Inpatient Diabetes Program Recommendations  AACE/ADA: New Consensus Statement on Inpatient Glycemic Control (2013)  Target Ranges:  Prepandial:   less than 140 mg/dL      Peak postprandial:   less than 180 mg/dL (1-2 hours)      Critically ill patients:  140 - 180 mg/dL   Results for MCKENSI, REDINGER (MRN [id number]) as of 09/05/2014 10:23  Ref. Range 09/04/2014 07:34 09/04/2014 11:01 09/04/2014 16:18 09/04/2014 22:06 09/05/2014 07:44  Glucose-Capillary Latest Ref Range: 65-99 mg/dL 259 (H) 204 (H) 269 (H) 242 (H) 149 (H)   Current orders for Inpatient glycemic control: Lantus 37 units daily, Novolog 0-15 units TID with meals, Novolog 0-5 units HS  Inpatient Diabetes Program Recommendations Insulin - Meal Coverage: If steroids are continued as ordered, please consider ordering Novolog 5 units TID with meals for meal coverage.  Thanks, Barnie Alderman, RN, MSN, CCRN, CDE Diabetes Coordinator Inpatient Diabetes Program [phone] (Team Pager from [place] to [place]) [phone] ([facility] office) [phone] [facility] office) [phone] [facility] office)

## 2014-09-06 LAB — BASIC METABOLIC PANEL
ANION GAP: 6 (ref 5–15)
BUN: 29 mg/dL — ABNORMAL HIGH (ref 6–20)
CO2: 35 mmol/L — ABNORMAL HIGH (ref 22–32)
CREATININE: 1.03 mg/dL — AB (ref 0.44–1.00)
Calcium: 9.3 mg/dL (ref 8.9–10.3)
Chloride: 96 mmol/L — ABNORMAL LOW (ref 101–111)
GFR calc Af Amer: >60 mL/min (ref >60)
GFR calc non Af Amer: 58 mL/min — ABNORMAL LOW (ref >60)
GLUCOSE: 119 mg/dL — AB (ref 65–99)
Potassium: 3.8 mmol/L (ref 3.5–5.1)
Sodium: 137 mmol/L (ref 135–145)

## 2014-09-06 LAB — GLUCOSE, CAPILLARY
Glucose-Capillary: 101 mg/dL — ABNORMAL HIGH (ref 65–99)
Glucose-Capillary: 150 mg/dL — ABNORMAL HIGH (ref 65–99)
Glucose-Capillary: 229 mg/dL — ABNORMAL HIGH (ref 65–99)
Glucose-Capillary: 164 mg/dL — ABNORMAL HIGH (ref 65–99)

## 2014-09-06 MED ORDER — POTASSIUM CHLORIDE CRYS ER 20 MEQ PO TBCR
20.0000 meq | EXTENDED_RELEASE_TABLET | Freq: Once | ORAL | Status: AC
Start: 2014-09-06 — End: 2014-09-06
  Administered 2014-09-06: 08:00:00 20 meq via ORAL
  Filled 2014-09-06: qty 1

## 2014-09-06 MED ORDER — FUROSEMIDE 10 MG/ML IJ SOLN
20.0000 mg | Freq: Once | INTRAMUSCULAR | Status: AC
  Administered 2014-09-06: 08:00:00 20 mg via INTRAVENOUS
  Filled 2014-09-06: qty 2

## 2014-09-06 NOTE — Plan of Care (Signed)
Problem: Discharge Progression Outcomes Goal: Discharge plan in place and appropriate Individualization of Care  Outcome: Progressing 1. Like to be called Tara Levine 2. Lives with husband 3. Hx of CML, anemia, diabetes, IDA, panic disorder, chronic back pain and allergies controlled by home meds. Goal: Other Discharge Outcomes/Goals Outcome: Progressing Plan of care progress to goal: Pain - pt continues to c/o severe pain in her back (9/10), dilaudid minimally covers her to 7/10 Hemodynamically stable - vitals stable this shift Complications - pt continues to have severe back pain, worsens with activity Activity - gets up to the bathroom with assistance

## 2014-09-06 NOTE — Evaluation (Signed)
Clinical/Bedside Swallow Evaluation Patient Details  Name: Tara Levine MRN: [id number] Date of Birth: Jan 16, 1956  Today's Date: 09/06/2014 Time: SLP Start Time (ACUTE ONLY): 1440 SLP Stop Time (ACUTE ONLY): 1540 SLP Time Calculation (min) (ACUTE ONLY): 60 min  Past Medical History:  Past Medical History  Diagnosis Date  . Unspecified essential hypertension   . HLD (hyperlipidemia)   . DM type 2 (diabetes mellitus, type 2)   . Environmental allergies   . Osteoarthritis   . IDA (iron deficiency anemia)     chronic  . History of gastric ulcer   . Panic disorder   . PSS (progressive systemic sclerosis)   . CML (chronic myelocytic leukemia)   . Leukemia, chronic myeloid    Past Surgical History:  Past Surgical History  Procedure Laterality Date  . Back and neck surgery  1999, 2009    had rods placed  . Partial hysterectomy      age 34, no cancer  . Carpal tunnel release    . Cardiac catheterization  [facility]) No blackages found.    HPI:  Tara Levine is a 59 y.o. female with a known history of CML, anemia, diabetes, and Reflux - she is on omeprazole. She presented the ED with feeling sick and coughing up blood clots at times tablespoons full of dark clots. She's been having pain in the back, legs, head and neck. She's been feeling short of breath. She's having fever chills and sweats. She's been feeling fatigued. She is having pain in the ribs with a deep breath and also coughs with a deep breath. In the ED, she was found to have bilateral pneumonia. She was found to be hypoxic on room air with pulse ox of 88%. Today there is less report of cough, overall feels improved. Still, significant lumbar back pains. Now off oxygen. CT scan of the chest revealed pneumonia, most noticeable on the right side - ground glass opacities . She admits of having some dysphagia symptoms intermittently, also gastroesophageal reflux . Being planned for barium swallowing study per MD.     Assessment / Plan / Recommendation Clinical Impression  Tara Levine appeared to safely tolerate trials of thin liquids and solids w/ no overt s/s of aspiration noted; no decline in vocal quality or respiratory status noted during/post trials. No oral phase deficits noted as well. During and at end of evaluation, Tara Levine exhibited consistent belching and c/o s/s of Reflux in general during the day and night. Tara Levine stated she has some difficulty upon when knowing to take her PPI as she cannot take it w/ her oral chemotherapy medication. Tara Levine appears at reduced risk for aspiration from an oropharyngeal standpoint, however, can be at increased risk for aspriation from aspiration of regurgitated material/Reflux. Rec. f/u w/ GI; MD has ordered a GI barium study as well. No fruther skilled ST services indicated, Reviewed general aspriation precautions and rec'd f/u w/ GI post discharge. Tara Levine agreed. NSG updated.     Aspiration Risk   (reduced)    Diet Recommendation Age appropriate regular solids;Thin   Medication Administration: Whole meds with liquid Compensations: Slow rate;Small sips/bites (reflux precautions)    Other  Recommendations Recommended Consults: Consider GI evaluation;Consider esophageal assessment Oral Care Recommendations: Oral care BID;Oral care before and after PO;Patient independent with oral care   Follow Up Recommendations       Frequency and Duration        Pertinent Vitals/Pain Stated she had back pain; 8/10. NSG aware  and attending to this.    SLP Swallow Goals  n/a   Swallow Study Prior Functional Status   Tara Levine resides at home; c/o baseline Reflux but indicated it did not seem well-controlled.    General Date of Onset: 09/06/14 Other Pertinent Information: Tara Levine is a 59 y.o. female with a known history of CML, anemia, diabetes, and Reflux - she is on omeprazole. She presented the ED with feeling sick and coughing up blood clots at times tablespoons full of dark clots. She's been having  pain in the back, legs, head and neck. She's been feeling short of breath. She's having fever chills and sweats. She's been feeling fatigued. She is having pain in the ribs with a deep breath and also coughs with a deep breath. In the ED, she was found to have bilateral pneumonia. She was found to be hypoxic on room air with pulse ox of 88%. Today there is less report of cough, overall feels improved. Still, significant lumbar back pains. Now off oxygen. CT scan of the chest revealed pneumonia, most noticeable on the right side - ground glass opacities . She admits of having some dysphagia symptoms intermittently, also gastroesophageal reflux . Being planned for barium swallowing study per MD.  Type of Study: Bedside swallow evaluation Previous Swallow Assessment: none indicated Diet Prior to this Study: Regular;Thin liquids Temperature Spikes Noted: No (wbc wnl) Respiratory Status: Room air History of Recent Intubation: No Behavior/Cognition: Alert;Cooperative;Other (Comment) (uncomfortable w/ pain issues) Oral Cavity - Dentition: Edentulous Self-Feeding Abilities: Able to feed self Patient Positioning: Upright in bed (EOB) Baseline Vocal Quality: Normal Volitional Cough: Strong Volitional Swallow: Able to elicit    Oral/Motor/Sensory Function Overall Oral Motor/Sensory Function: Appears within functional limits for tasks assessed Labial ROM: Within Functional Limits Labial Symmetry: Within Functional Limits Labial Strength: Within Functional Limits Lingual ROM: Within Functional Limits Lingual Symmetry: Within Functional Limits Lingual Strength: Within Functional Limits Facial Symmetry: Within Functional Limits Mandible: Within Functional Limits   Ice Chips Ice chips: Not tested   Thin Liquid Thin Liquid: Within functional limits Presentation: Straw;Self Fed (bottle) Other Comments: few sips     Nectar Thick Nectar Thick Liquid: Not tested   Honey Thick Honey Thick Liquid: Not tested    Puree Puree: Within functional limits Presentation: Self Fed;Spoon (3 trials)   Solid   GO    Solid: Within functional limits Presentation: Self Fed (2 trials)       Barnet Benavides 09/06/2014,4:14 PM

## 2014-09-06 NOTE — Progress Notes (Signed)
Initial Nutrition Assessment  INTERVENTION:  Meals and Snacks: Cater to patient preferences   NUTRITION DIAGNOSIS:  No nutrition diagnosis at this time.  GOAL:  Patient will meet greater than or equal to 90% of their needs  MONITOR:   (Energy Intake, Glucose Profile)  REASON FOR ASSESSMENT:  LOS    ASSESSMENT:  Pt admitted with acute respiratory failure with sepsis secondary to pna. Pt s/p MRI yesterday for lumbar pain.  PMHx:  Past Medical History  Diagnosis Date  . Unspecified essential hypertension   . HLD (hyperlipidemia)   . DM type 2 (diabetes mellitus, type 2)   . Environmental allergies   . Osteoarthritis   . IDA (iron deficiency anemia)     chronic  . History of gastric ulcer   . Panic disorder   . PSS (progressive systemic sclerosis)   . CML (chronic myelocytic leukemia)   . Leukemia, chronic myeloid     Diet Order:  Diet heart healthy/carb modified Room service appropriate?: Yes; Fluid consistency:: Thin  Current Nutrition: Recorded po intake since admission 80-100% of meals. Pt having a shower on visit this am but CNA reports pt ate breakfast.   Food/Nutrition-Related History: Per MST no decrease in appetite PTA.   Medications: prednisone, omega-3, Lantus, Novolog, Colace  Electrolyte/Renal Profile and Glucose Profile:   Recent Labs Lab 09/03/14 0454 09/04/14 0420 09/04/14 1250 09/05/14 1411 09/06/14 0728  NA 133* 133*  --   --  137  K 4.8 5.4* 4.9 4.5 3.8  CL 101 99*  --   --  96*  CO2 25 28  --   --  35*  BUN 26* 27*  --   --  29*  CREATININE 1.23* 1.24*  --   --  1.03*  CALCIUM 8.2* 9.3  --   --  9.3  GLUCOSE 242* 217*  --   --  119*   Protein Profile:  Recent Labs Lab 08/31/14 1721  ALBUMIN 2.8*    Gastrointestinal Profile: Last BM:  09/05/2014   Skin:  Reviewed, no issues  Nutrition-Focused Physical Exam Findings:  Unable to complete Nutrition-Focused physical exam at this time.    Weight Change: per CH[facility] pt with  weight gain. Anthropometrics:  Height:  Ht Readings from Last 1 Encounters:  08/31/14 5\' 7"  (1.702 m)    Weight:  Wt Readings from Last 1 Encounters:  08/31/14 322 lb 14.4 oz (146.466 kg)    Ideal Body Weight:  61.4 kg  Wt Readings from Last 10 Encounters:  08/31/14 322 lb 14.4 oz (146.466 kg)  09/05/14 322 lb (146.058 kg)  07/28/14 320 lb 15.8 oz (145.6 kg)  05/18/14 318 lb 8 oz (144.471 kg)  04/05/14 300 lb (136.079 kg)  01/31/14 300 lb (136.079 kg)  01/29/13 300 lb (136.079 kg)  04/02/11 250 lb (113.399 kg)    BMI:  Body mass index is 50.56 kg/(m^2).  Estimated Nutritional Needs:  Kcal:  1746-2065kcals, BEE: 1222kcals, TEE: (IF 1.1-1.3)(AF 1.3) using IBW of 61.4kg  Protein:  61-74g protein (1.0-1.2g/kg) using IBW of 61.4kg  Fluid:  1535-1873mL of fluid (25-3mL/kg) using IBW of 61.4kg  EDUCATION NEEDS:  Education needs no appropriate at this time  [place], RD, LDN Pager [phone]

## 2014-09-06 NOTE — Care Management (Signed)
Admitted to [facility] with the diagnosis of acute respiratory failure. Lives with husband, Tara Levine.                    [phone]). Sees Dr. Iona Beard at [facility] Last seen a couple of months ago. States she will be requesting another physician for her follow-up care. Home Health in 2014. Doesn't remember the name of the agency.  States she was at [facility] for a couple of weeks following her discharge from this facility. States "that will never happen again." Uses a cane to aid in ambulation as needed. Husband helps her with her basic activities of daily living.  Physical therapy and Barium Swallow pending Shelbie Ammons RN MSN Care Management [phone]

## 2014-09-06 NOTE — Progress Notes (Signed)
[facility] NAME: Tara Levine    MR#:  [id number]  DATE OF BIRTH:  Aug 29, 1955  SUBJECTIVE:  CHIEF COMPLAINT:   Chief Complaint  Patient presents with  . Fever  LESS cough, overall feels improved. Still, significant lumbar back pains. Now off oxygen. CT scan of the chest revealed pneumonia, most noticeable on the right side . She admits of having some dysphagia symptoms intermittently, also gastroesophageal reflux . Being planned for barium swallowing study today .  REVIEW OF SYSTEMS:  CONSTITUTIONAL: No fever, generalized weakness.  EYES: No blurred or double vision.  EARS, NOSE, AND THROAT: No tinnitus or ear pain.  RESPIRATORY: has cough and shortness of breath, no wheezing or hemoptysis.  CARDIOVASCULAR: No chest pain, orthopnea, edema.  GASTROINTESTINAL: No nausea, vomiting, diarrhea or abdominal pain. But constipation. GENITOURINARY: No dysuria, hematuria.  ENDOCRINE: No polyuria, nocturia,  HEMATOLOGY: No anemia, easy bruising or bleeding SKIN: No rash or lesion. MUSCULOSKELETAL:  Back pain. NEUROLOGIC: No tingling, numbness, weakness.  PSYCHIATRY: No anxiety or depression.   DRUG ALLERGIES:   Allergies  Allergen Reactions  . Codeine Other (See Comments)    Reaction: Unknown   . Latex Other (See Comments)    Reaction:  Unknown   . Morphine And Related Other (See Comments)    Reaction:  Unknown   . Tape Other (See Comments)    Reaction:  Unknown   . Nsaids Rash    VITALS:  Blood pressure 138/74, pulse 71, temperature 98 F (36.7 C), temperature source Oral, resp. rate 18, height 5\' 7"  (1.702 m), weight 146.466 kg (322 lb 14.4 oz), SpO2 99 %.  PHYSICAL EXAMINATION:  GENERAL:  59 y.o.-year-old patient lying in the bed with no acute distress. Morbid obese. Difficult to move from side to side in bed EYES: Pupils equal, round, reactive to light and accommodation. No scleral icterus. Extraocular muscles  intact.  HEENT: Head atraumatic, normocephalic. Oropharynx and nasopharynx clear.  NECK:  Supple, no jugular venous distention. No thyroid enlargement, no tenderness.  LUNGS: Normal breath sounds bilaterally, no wheezing, rales,rhonchi or crepitation. No use of accessory muscles of respiration. Diminished sounds anteriorly on the right  CARDIOVASCULAR: S1, S2 normal. No murmurs, rubs, or gallops.  ABDOMEN: Soft, nontender, nondistended. Bowel sounds present. No organomegaly or mass.  EXTREMITIES: Trace calve and pedal edema, no cyanosis, or clubbing.  NEUROLOGIC: Cranial nerves II through XII are intact. Muscle strength 5/5 in all extremities. Sensation intact. Gait not checked.  PSYCHIATRIC: The patient is alert and oriented x 3.  SKIN: No obvious rash, lesion, or ulcer.    LABORATORY PANEL:   CBC  Recent Labs Lab 09/04/14 0420  WBC 9.5  HGB 9.9*  HCT 29.7*  PLT 207   ------------------------------------------------------------------------------------------------------------------  Chemistries   Recent Labs Lab 08/31/14 1721  09/06/14 0728  NA 129*  < > 137  K 4.9  < > 3.8  CL 95*  < > 96*  CO2 26  < > 35*  GLUCOSE 143*  < > 119*  BUN 25*  < > 29*  CREATININE 1.33*  < > 1.03*  CALCIUM 7.9*  < > 9.3  AST 25  --   --   ALT 33  --   --   ALKPHOS 60  --   --   BILITOT 0.6  --   --   < > = values in this interval not displayed. ------------------------------------------------------------------------------------------------------------------  Cardiac Enzymes  Recent Labs Lab  08/31/14 1721  TROPONINI <0.03   ------------------------------------------------------------------------------------------------------------------  RADIOLOGY:  Ct Chest Wo Contrast  09/05/2014   CLINICAL DATA:  Pneumonia ; hypoxia. History of chronic myelocytic leukemia.  EXAM: CT CHEST WITHOUT CONTRAST  TECHNIQUE: Multidetector CT imaging of the chest was performed following the standard  protocol without IV contrast.  COMPARISON:  Chest CT May 04, 2014; chest radiograph August 31, 2014  FINDINGS: There is predominantly perihilar ground-glass type opacity bilaterally, significantly more on the right than on the left. There is patchy airspace consolidation in the right middle lobe as well as in the medial aspect of the posterior segment of the left lower lobe. There are no appreciable effusions are areas of interstitial fibrotic change. There is a small free-flowing effusion on the right.  Visualized thyroid appears normal. There are scattered small mediastinal lymph nodes. There is enlargement of a lymph node just anterior to the carina on the right measuring 2.1 x 1.5 cm. Pericardium is not thickened.  In the visualized upper abdomen, no lesions are identified.  There is degenerative change in the thoracic spine. There are no blastic or lytic bone lesions.  IMPRESSION: Areas of airspace consolidation in the right middle and left lower lobe with patchy atelectatic change and ground-glass type opacity bilaterally in a somewhat perihilar distribution. There is overall significantly more ground-glass opacity and atelectatic change on the right side compared to the left. This appearance is most consistent with multifocal infectious pneumonia, although a degree of noncardiogenic edema or allergic type phenomenon could present with these findings. More than one of these entities may exist concurrently. There is a fairly small right pleural effusion. This effusion may have reactive etiology. A single prominent lymph node probably has reactive etiology.   Electronically Signed   By: Lowella Grip III M.D.   On: 09/05/2014 15:16   Mr Lumbar Spine Wo Contrast  09/05/2014   CLINICAL DATA:  59 year old female with twisting injury last week. Lumbar back pain radiating to both lower extremities with numbness burning and tingling. Prior spine surgery. Initial encounter.  EXAM: MRI LUMBAR SPINE WITHOUT CONTRAST   TECHNIQUE: Multiplanar, multisequence MR imaging of the lumbar spine was performed. No intravenous contrast was administered.  COMPARISON:  CT Abdomen and Pelvis 09/02/2010. Lumbar radiographs 09/01/2010.  FINDINGS: Chronic L2 through S1 level transpedicular hardware. Chronic interbody implant at L2-L3. Chronic interbody ankylosis at L1-L2. Mild to moderate associated spinal hardware susceptibility artifact. Vertebral height and alignment appears stable since 2012. No marrow edema or evidence of acute osseous abnormality.  No signal abnormality identified in the visualized lower thoracic spinal cord. Conus medullaris at L1.  Negative visualized abdominal viscera. Postoperative changes to the posterior paraspinal soft tissues. Suggestion of small postoperative fluid collection in the L3 laminectomy space, but no mass effect on the thecal sac. See additional postoperative details below.  T11-T12: Circumferential disc bulge and mild to moderate posterior element hypertrophy. Mild spinal stenosis (series 2, image 9).  T12-L1: Disc space loss and circumferential disc osteophyte complex with broad-based posterior component of disc. Mild facet and ligament flavum hypertrophy. No significant stenosis.  L1-L2: Interbody ankylosis with residual circumferential endplate osteophytosis. Mild facet hypertrophy greater on the right. Borderline to mild spinal stenosis appears stable since 2012.  L2-L3: Sequelae of decompression and fusion with residual endplate and posterior element osteophytosis. Up to mild L2 foraminal stenosis. No spinal or lateral recess stenosis.  L3-L4: Sequelae of decompression and fusion with residual endplate and posterior element osteophytosis. No spinal stenosis. No convincing  lateral recess stenosis. Up to mild left L3 foraminal stenosis.  L4-L5: Sequelae of decompression and fusion with mild endplate and up to moderate residual facet hypertrophy. No convincing stenosis.  L5-S1: Sequelae of decompression  and fusion. Residual endplate spurring and mild to moderate facet hypertrophy. No spinal or lateral recess stenosis. Up to mild L5 foraminal stenosis.  Visible sacrum is intact.  IMPRESSION: 1. No acute osseous abnormality identified in the lumbar spine. Chronic postoperative changes from the L1 to the S1 level. 2. 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 3. No lumbar spinal stenosis. Mild residual lumbar foraminal stenosis related to endplate and facet spurring.   Electronically Signed   By: Genevie Ann M.D.   On: 09/05/2014 19:18    EKG:   Orders placed or performed in visit on 05/18/14  . EKG 12-Lead    ASSESSMENT AND PLAN:   1. Acute respiratory failure with hypoxia Now off [place] oxygen, tapering steroids and continue NEBs 2. Bilateral pneumonia,Most prominent on the right side, concerning for possible aspiration pneumonitis . Getting barium swallowing study to rule out esophageal dysmotility or gastroesophageal reflux disease. Discussed this patient extensively and recommended elevate head at around 30 at nighttime . Speech therapist will be involved as well to evaluate her for dysphagia. Continue Levaquin.  Negative one blood culture. Urine culture was taken and it revealed multiple species, likely contamination. Sputum culture shows many gram-negative rods, also gram-positive cocci in pairs and clusters and a few gram arrival rods, ID is pending. CT scan of chest reveals mostly right-sided pneumonia, questionable aspiration pneumonitis  3. Hyponatremia and ARF/dehydration. Continue normal saline and f/u BMP in am,  holding spironolactone, Naprosyn,  Cozaar and Lasix. Some improvement from few days ago, but suspect chronic renal insufficiency, repeated today was stable  4.  type 2 diabetes. Uncontrolled.  Increased to moderate sliding scale and Lantus from 20 units to 37 units, blood glucose levels 101 today  in the morning.   5. CML. F/u  Dr. Ma Hillock, patient request.  Dr.Pandit's consultation,follow-up with him as outpatient * Hyperkalemia. Kayexalate given , potassium has improved. Following BMP as outpatient. May not be able to use Cozaar or spironolactone due to hyperkalemia  * Constipation. Colace and MOM prn. Constipation has resolved * . Perineal candidiasis per report, given 150 mg of  Diflucana before yesterday. No more complaints *. Back pain, MRI of the  lumbar spines howed just chronic changes  ,, continue  pain management with fentanyl patch and get physical therapist consultation  Of note, the patient is a Jehovah's Witness and will not take any blood products.  All the records are reviewed and case discussed with Care Management/Social Workerr. Management plans discussed with the patient, her husband and they are in agreement.  CODE STATUS: FULL CODE  TOTAL TIME TAKING CARE OF THIS PATIENT: 40 minutes.   Patient is very emotional, crying during my evaluation, tried to reassure her,  answered all her questions,  emotional support provided additional time spent approximately 20 more minutes   Anntonette Madewell M.D on 09/06/2014 at 1:18 PM  Between 7am to 6pm - Pager - [phone]  After 6pm go to [email or web address] - password [email or web address] [facility]  Office  [phone]  CC: Primary care physician; Valera Castle, MD

## 2014-09-06 NOTE — Evaluation (Signed)
Physical Therapy Evaluation Patient Details Name: Tara Levine MRN: [id number] DOB: 1955/10/12 Today's Date: 09/06/2014   History of Present Illness  Patient is a 59 y/o female that presents with acute respiratory failure. Patient has a history of chronic myelogenous leukemia, and has been noted to be coughing up blood. She has complained of generalized back pain, as well as pain globally during this admission.   Clinical Impression  Patient is a 59 y/o female with history of CML and pneumonia and presents with acute respiratory distress. Patient complains of pain in multiple spots during the session and consistently mentions pain originating from "3 herniated discs" and "bone on bone" in her lower back, though the area she describes pain in his right right posterior flank. PT provided gentle soft tissue mobilization in standing which provided significant relief. Today patient presents with decreased balance from her baseline as she begins reaching for assistance immediately upon standing, and displayed improved balance/safety with a RW. Patient would benefit from HHPT once her acute medical conditions are resolved to increase her safety with mobility and provide exercises to reduce her low back pain. Skilled acute PT services are indicated to address the above deficits.    Follow Up Recommendations Home health PT    Equipment Recommendations  Rolling walker with 5" wheels (Bariatric RW)    Recommendations for Other Services       Precautions / Restrictions        Mobility  Bed Mobility Overal bed mobility: Modified Independent             General bed mobility comments: Patient uses bed rails to transfer in and out of bed.   Transfers Overall transfer level: Needs assistance   Transfers: Sit to/from Stand Sit to Stand: Min guard         General transfer comment: Patient is able to transfer sit <--> stand without AD however she is off balance once in standing.    Ambulation/Gait Ambulation/Gait assistance: Min guard Ambulation Distance (Feet): 60 Feet Assistive device: Rolling walker (2 wheeled) Gait Pattern/deviations: Step-through pattern   Gait velocity interpretation: <1.8 ft/sec, indicative of risk for recurrent falls General Gait Details: Patient initially ambulates without AD and begins reaching out for assistance for balance. Patient states she felt improved pain relief with RW as it offset the load on her lower back.   Stairs            Wheelchair Mobility    Modified Rankin (Stroke Patients Only)       Balance Overall balance assessment: Needs assistance Sitting-balance support: Feet unsupported Sitting balance-Leahy Scale: Good Sitting balance - Comments: Notes she cannot sit up for long secondary to low back pain.      Standing balance-Leahy Scale: Fair Standing balance comment: Patient is able to take several steps without RW, however she reaches out for assistance and looks generally unsafe. Patient felt and looked much more comfortable with a RW for balance assistance.                              Pertinent Vitals/Pain Pain Assessment:  (Patient does not rate her pain but describes pain in her neck, right side of her lower back, and left distal biceps region during the session.)    Home Living Family/patient expects to be discharged to:: Private residence Living Arrangements: Spouse/significant other Available Help at Discharge: Family Type of Home: House Home Access: Level entry  Home Layout: One level Home Equipment: Cane - single point      Prior Function Level of Independence: Independent with assistive device(s)         Comments: Patient typically ambulates without AD, however she does use a cane at times. She states she has had several near falls.      Hand Dominance        Extremity/Trunk Assessment   Upper Extremity Assessment: Overall WFL for tasks assessed            Lower Extremity Assessment: Overall WFL for tasks assessed         Communication   Communication: No difficulties  Cognition Arousal/Alertness: Awake/alert Behavior During Therapy: WFL for tasks assessed/performed;Anxious Overall Cognitive Status: Within Functional Limits for tasks assessed                      General Comments      Exercises        Assessment/Plan    PT Assessment Patient needs continued PT services  PT Diagnosis Difficulty walking;Generalized weakness   PT Problem List Decreased strength;Pain;Decreased balance;Decreased mobility;Decreased knowledge of use of DME  PT Treatment Interventions DME instruction;Gait training;Therapeutic activities;Therapeutic exercise;Balance training   PT Goals (Current goals can be found in the Care Plan section) Acute Rehab PT Goals Patient Stated Goal: To decrease her LBP and increase her ambulation.  PT Goal Formulation: With patient Time For Goal Achievement: 09/20/14 Potential to Achieve Goals: Fair    Frequency Min 2X/week   Barriers to discharge        Co-evaluation               End of Session Equipment Utilized During Treatment: Gait belt Activity Tolerance: Patient tolerated treatment well Patient left: in bed;with call bell/phone within reach (She was received with no alarm on. ) Nurse Communication: Mobility status (Patient requesting shower/bathing. )         Time: 7867-6720 PT Time Calculation (min) (ACUTE ONLY): 29 min   Charges:   PT Evaluation $Initial PT Evaluation Tier I: 1 Procedure     PT G Codes:       Kerman Passey, PT, DPT    09/06/2014, 11:01 AM

## 2014-09-06 NOTE — Progress Notes (Signed)
Plan of care. Pt cont to c/o  Of and require pain med.  Chronic back  Pain.  Improving, p.t. Saw pt today . Pt oob and amb to br. 2 bms today.

## 2014-09-07 ENCOUNTER — Inpatient Hospital Stay: Payer: Medicaid Other

## 2014-09-07 DIAGNOSIS — K5900 Constipation, unspecified: Secondary | ICD-10-CM

## 2014-09-07 DIAGNOSIS — N179 Acute kidney failure, unspecified: Secondary | ICD-10-CM

## 2014-09-07 DIAGNOSIS — M4804 Spinal stenosis, thoracic region: Secondary | ICD-10-CM

## 2014-09-07 DIAGNOSIS — E875 Hyperkalemia: Secondary | ICD-10-CM

## 2014-09-07 DIAGNOSIS — L22 Diaper dermatitis: Secondary | ICD-10-CM

## 2014-09-07 DIAGNOSIS — N189 Chronic kidney disease, unspecified: Secondary | ICD-10-CM

## 2014-09-07 DIAGNOSIS — E871 Hypo-osmolality and hyponatremia: Secondary | ICD-10-CM

## 2014-09-07 DIAGNOSIS — G8929 Other chronic pain: Secondary | ICD-10-CM

## 2014-09-07 DIAGNOSIS — L304 Erythema intertrigo: Secondary | ICD-10-CM

## 2014-09-07 DIAGNOSIS — J189 Pneumonia, unspecified organism: Secondary | ICD-10-CM

## 2014-09-07 DIAGNOSIS — E662 Morbid (severe) obesity with alveolar hypoventilation: Secondary | ICD-10-CM

## 2014-09-07 DIAGNOSIS — G4733 Obstructive sleep apnea (adult) (pediatric): Secondary | ICD-10-CM

## 2014-09-07 DIAGNOSIS — B372 Candidiasis of skin and nail: Secondary | ICD-10-CM

## 2014-09-07 DIAGNOSIS — M549 Dorsalgia, unspecified: Secondary | ICD-10-CM

## 2014-09-07 LAB — GLUCOSE, CAPILLARY
GLUCOSE-CAPILLARY: 131 mg/dL — AB (ref 65–99)
Glucose-Capillary: 108 mg/dL — ABNORMAL HIGH (ref 65–99)

## 2014-09-07 MED ORDER — LEVOFLOXACIN 500 MG PO TABS: 500.0000 mg | ORAL_TABLET | Freq: Every day | ORAL | Status: DC

## 2014-09-07 MED ORDER — HYDROMORPHONE HCL 1 MG/ML IJ SOLN
1.0000 mg | Freq: Once | INTRAMUSCULAR | Status: AC
  Administered 2014-09-07: 1 mg via SUBCUTANEOUS
  Filled 2014-09-07: qty 1

## 2014-09-07 MED ORDER — FENTANYL 25 MCG/HR TD PT72: 25.0000 ug | MEDICATED_PATCH | TRANSDERMAL | Status: DC

## 2014-09-07 MED ORDER — INSULIN GLARGINE 100 UNIT/ML ~~LOC~~ SOLN: 37.0000 [IU] | Freq: Every day | SUBCUTANEOUS | Status: DC

## 2014-09-07 MED ORDER — MORPHINE SULFATE 4 MG/ML IJ SOLN: 4.0000 mg | Freq: Once | INTRAMUSCULAR | Status: DC

## 2014-09-07 NOTE — Plan of Care (Signed)
Problem: Discharge Progression Outcomes Goal: Discharge plan in place and appropriate Individualization of Care  Outcome: Progressing 1. Like to be called Tara Levine 2. Lives with husband 3. Hx of CML, anemia, diabetes, IDA, panic disorder, chronic back pain and allergies controlled by home meds. Goal: Pain controlled with appropriate interventions Outcome: Not Met (add Reason) Unable to meet the demands of pain control for patient due to obstacles such as NPO status past midnight. IV Dilaudid was administered as ordered for severe pain. Pt wanted Tylenol #3 for headache despite NPO status. Pt given options to cancel her procedure today and take po meds. Pt wanted both Dilaudid and Tylenol #3 to be administered simultaneously for her pain. Pt did not want Fiorcet for her headache, stated that the only thing that worked for her headache was Tylenol #3. Even though pt said that Dilaudid was useless for her headache patient still took the Dilaudid for not only back pain but headache as well. Referred to [facility]. Also, this writer was tied up with another patient that just came up from the ED so another staff nurse Andee Poles) went in to see patient because the patient was demanding to see "her nurse". The patient became irate with not only the [facility] but the staff nurse as well. Later on patient apologized to staff nurse Danielle for her behavior and said that she has a history of panic disorder as witnessed by this Probation officer.  Goal: Complications resolved/controlled Outcome: Progressing Numerous test have been ordered for patient since admission . Pt originally admitted with acute respiratory failure but has had an MRI of her lumbar spine and today she is scheduled for a barium swallow. Lungs sounds are diminished. Goal: Other Discharge Outcomes/Goals Outcome: Progressing Pt NPO for barium swallow today. Pt threatened to leave and even called her husband in the middle of the night to come get her  because she could not get two pain medications simultaneously. Pt said that staff were not being compassionate to her needs despite

## 2014-09-07 NOTE — Discharge Summary (Signed)
[facility] NAME: Tara Levine    MR#:  [id number]  DATE OF BIRTH:  [birth year]  DATE OF ADMISSION:  08/31/2014 ADMITTING PHYSICIAN: Loletha Grayer, MD  DATE OF DISCHARGE: No discharge date for patient encounter.  PRIMARY CARE PHYSICIAN: Valera Castle, MD     ADMISSION DIAGNOSIS:  Bilateral pneumonia [J18.9]  DISCHARGE DIAGNOSIS:  Active Problems:   Acute respiratory failure with hypoxia   Bilateral pneumonia   OSA (obstructive sleep apnea)   Obesity hypoventilation syndrome   Morbid obesity   Spinal stenosis, thoracic   Chronic back pain   Hyponatremia   Acute on chronic renal failure   Hyperkalemia   Diaper candidiasis   Constipation   CML (chronic myelocytic leukemia)   SECONDARY DIAGNOSIS:   Past Medical History  Diagnosis Date  . Unspecified essential hypertension   . HLD (hyperlipidemia)   . DM type 2 (diabetes mellitus, type 2)   . Environmental allergies   . Osteoarthritis   . IDA (iron deficiency anemia)     chronic  . History of gastric ulcer   . Panic disorder   . PSS (progressive systemic sclerosis)   . CML (chronic myelocytic leukemia)   . Leukemia, chronic myeloid     .pro HOSPITAL COURSE:   Patient is 59 year old Caucasian female with a history of severe morbid obesity, suspected obstructive sleep apnea, obesity hypoventilation syndrome who presents to the hospital with complaints of fever, also coughing up some dark blood. She was complaining of pain all over her body and was short of breath. Her oxygen saturations were noted to be 88% on room air and the hospitalist services were contacted for admission. Patient's chest x-ray done on 08/31/2014 revealed multifocal airspace consolidation in the lungs bilaterally, concerning for multilobar pneumonia. Patient was admitted to the hospital for further evaluation and therapy with antibiotics. She was continued on steroids, inhalation  therapy and antibiotics as well as oxygen and was slowly weaned off oxygen on the day of discharge, she is able to breathe freely with no significant discomfort and her oxygen saturations adequate at 94% on room air. Patient was advised to continue inhalation therapy as well as antibiotic therapy for 7 more days to complete course. Patient was also seen by speech therapist for questionable aspiration, but there was no suspicion for oropharyngeal phase of aspirations by speech therapist. Patient also underwent barium swallowing study which was unremarkable.  Since there was suspicion that patient has underlying obstructive sleep apnea, obesity hypoventilation syndrome, sleep study as outpatient to be performed was recommended upon discharge.  Patient was also having significant abdominal as well as back pain, maria of patient's lumbar spine was performed which revealed mild lower thoracic stenosis of T11-T12 but no lumbar stenosis was noted. It was felt that patient's back pain as well as abdominal pain was very likely due to chronic changes. Pain medications were advanced adding fentanyl patch and the pain clinic follow-up was recommended. Patient was seen by physical therapist who recommended home health services.  Discussion by problem 1. Acute respiratory failure with hypoxia Weaned off oxygen, tapered off steroids and continue NEBs 2. Bilateral pneumonia,Most prominent on the right side, was concerning for possible aspiration pneumonitis . Barium swallowing study, however, showed no esophageal dysmotility or gastroesophageal reflux disease. Discussed this patient extensively again and still recommended to elevate head at around 30 at nighttime . Speech therapist did not he feel she had oropharyngeal dysphagia. Continue Levaquin for  7 more days to complete course. Negative one blood culture. Urine culture was taken and it revealed multiple species, likely contamination. Sputum culture shows many  gram-negative rods, also gram-positive cocci in pairs and clusters and a few gram arrival rods, ID is still pending  since 09/04/2014, suspect saliva. CT scan of chest reveals mostly right-sided pneumonia, questionable aspiration pneumonitis, as above we will continue antibiotic therapy for prolonged course. Patient would benefit from sleep study as outpatient 3. Hyponatremia and ARF/dehydration. Resolved on normal saline, resume spironolactone, but not Naprosyn,resume Cozaar and Lasix. Kidney function improved. However, patient does have what looks like chronic renal insufficiency,  stable  4. type 2 diabetes. Uncontrolled at home. Continue higher dose of Lantus at 37 units, blood glucose levels 108 today in the morning. Good control on current medications  5. CML. F/u Dr. Ma Hillock as outpatient * Hyperkalemia. Kayexalate given , potassium has normalized. Following BMP as outpatient, since patient is back on Cozaar or spironolactone.   * Constipation. Colace and MOM prn. Constipation has resolved * . Perineal candidiasis per report, given 150 mg of Diflucan once, no more complains *. Back pain, MRI of the lumbar spines howed chronic changes ,, continue pain management with fentanyl patch and get HH physical therapy * Anxiety, depression patient is to continue her outpatient medications and follow up with Dr. Weber Cooks as outpatient DISCHARGE CONDITIONS:   Stable  CONSULTS OBTAINED:  Treatment Team:  Leia Alf, MD  DRUG ALLERGIES:   Allergies  Allergen Reactions  . Codeine Other (See Comments)    Reaction: Unknown   . Latex Other (See Comments)    Reaction:  Unknown   . Morphine And Related Other (See Comments)    Reaction:  Unknown   . Tape Other (See Comments)    Reaction:  Unknown   . Nsaids Rash    DISCHARGE MEDICATIONS:   Current Discharge Medication List    START taking these medications   Details  fentaNYL (DURAGESIC - DOSED MCG/HR) 25 MCG/HR patch Place 1  patch (25 mcg total) onto the skin every 3 (three) days. Qty: 5 patch, Refills: 0    levofloxacin (LEVAQUIN) 500 MG tablet Take 1 tablet (500 mg total) by mouth daily. Qty: 7 tablet, Refills: 0      CONTINUE these medications which have CHANGED   Details  insulin glargine (LANTUS) 100 UNIT/ML injection Inject 0.37 mLs (37 Units total) into the skin daily. Qty: 10 mL, Refills: 11      CONTINUE these medications which have NOT CHANGED   Details  acetaminophen-codeine (TYLENOL #3) 300-30 MG per tablet Take 1 tablet by mouth every 12 (twelve) hours as needed for moderate pain.     albuterol (PROVENTIL HFA;VENTOLIN HFA) 108 (90 BASE) MCG/ACT inhaler Inhale 2 puffs into the lungs every 6 (six) hours as needed for wheezing or shortness of breath.    albuterol (PROVENTIL) (2.5 MG/3ML) 0.083% nebulizer solution Take 2.5 mg by nebulization every 6 (six) hours as needed for wheezing or shortness of breath.    ALPRAZolam (XANAX) 1 MG tablet Take 1 mg by mouth 3 (three) times daily as needed for anxiety.    Ascorbic Acid (VITAMIN C) 1000 MG tablet Take 3,000 mg by mouth daily.    bosutinib (BOSULIF) 100 MG tablet Take 300 mg by mouth daily.    Associated Diagnoses: CML (chronic myeloid leukemia)    butalbital-acetaminophen-caffeine (FIORICET WITH CODEINE) 50-325-40-30 MG per capsule Take 1 capsule by mouth every 4 (four) hours as needed for  migraine.    cetirizine (ZYRTEC) 10 MG tablet Take 20 mg by mouth daily.     Cholecalciferol (VITAMIN D PO) Take 1 tablet by mouth daily.    cyanocobalamin (,VITAMIN B-12,) 1000 MCG/ML injection Inject 1,000 mcg into the muscle every 30 (thirty) days.    diphenhydrAMINE (BENADRYL) 25 MG tablet Take 25 mg by mouth every 6 (six) hours as needed for itching or allergies.     docusate sodium (COLACE) 100 MG capsule Take 100 mg by mouth 2 (two) times daily as needed for mild constipation.     DULoxetine (CYMBALTA) 20 MG capsule Take 20 mg by mouth 3 (three)  times daily.    estradiol (ESTRACE) 2 MG tablet Take 2 mg by mouth daily.    fluticasone (FLONASE) 50 MCG/ACT nasal spray Place 1-2 sprays into both nostrils as needed for rhinitis.     furosemide (LASIX) 40 MG tablet Take 40 mg by mouth daily.    gabapentin (NEURONTIN) 300 MG capsule Take 300 mg by mouth 4 (four) times daily.     insulin aspart (NOVOLOG) 100 UNIT/ML injection Inject 15 Units into the skin 3 (three) times daily as needed for high blood sugar.     losartan (COZAAR) 100 MG tablet Take 100 mg by mouth daily.    MAGNESIUM PO Take 5 tablets by mouth daily.    metFORMIN (GLUCOPHAGE) 1000 MG tablet Take 1,000 mg by mouth 2 (two) times daily.    montelukast (SINGULAIR) 10 MG tablet Take 10 mg by mouth at bedtime.    Omega-3 Fatty Acids (OMEGA 3 PO) Take 3 capsules by mouth daily.    pregabalin (LYRICA) 150 MG capsule Take 150 mg by mouth daily.    spironolactone (ALDACTONE) 25 MG tablet Take 25 mg by mouth 2 (two) times daily.    Associated Diagnoses: CML (chronic myeloid leukemia)    traZODone (DESYREL) 150 MG tablet Take 150 mg by mouth at bedtime.     vitamin B-12 (CYANOCOBALAMIN) 1000 MCG tablet Take 4,000 mcg by mouth daily.      STOP taking these medications     celecoxib (CELEBREX) 200 MG capsule      naproxen sodium (ANAPROX) 220 MG tablet          DISCHARGE INSTRUCTIONS:    Follow-up with her primary care physician also such as Dr. Weber Cooks also pain clinic as outpatient. He will need to have obstructive sleep apnea evaluation done with sleep study as outpatient. Please have BMP checked in the next 3-4 days after discharged, to rule out hyperkalemia, which could be due to use of spironolactone and ARB  If you experience worsening of your admission symptoms, develop shortness of breath, life threatening emergency, suicidal or homicidal thoughts you must seek medical attention immediately by calling 911 or calling your MD immediately  if symptoms less  severe.  You Must read complete instructions/literature along with all the possible adverse reactions/side effects for all the Medicines you take and that have been prescribed to you. Take any new Medicines after you have completely understood and accept all the possible adverse reactions/side effects.   Please note  You were cared for by a hospitalist during your hospital stay. If you have any questions about your discharge medications or the care you received while you were in the hospital after you are discharged, you can call the unit and asked to speak with the hospitalist on call if the hospitalist that took care of you is not available. Once you are  discharged, your primary care physician will handle any further medical issues. Please note that NO REFILLS for any discharge medications will be authorized once you are discharged, as it is imperative that you return to your primary care physician (or establish a relationship with a primary care physician if you do not have one) for your aftercare needs so that they can reassess your need for medications and monitor your lab values.    Today   CHIEF COMPLAINT:   Chief Complaint  Patient presents with  . Fever    HISTORY OF PRESENT ILLNESS:  Benay Rumberger  is a 59 y.o. female with a known history of morbid obesity, chronic back pains who presents to the hospital with shortness of breath and hypoxia. Her oxygen saturations were noted to be 88% on room air and the hospitalist services were contacted for admission. Patient's chest x-ray done on 08/31/2014 revealed multifocal airspace consolidation in the lungs bilaterally, concerning for multilobar pneumonia. Patient was admitted to the hospital for further evaluation and therapy with antibiotics. She was continued on steroids, inhalation therapy and antibiotics as well as oxygen and was slowly weaned off oxygen on the day of discharge, she is able to breathe freely with no significant discomfort  and her oxygen saturations adequate at 94% on room air. Patient was advised to continue inhalation therapy as well as antibiotic therapy for 7 more days to complete course. Patient was also seen by speech therapist for questionable aspiration, but there was no suspicion for oropharyngeal phase of aspirations by speech therapist. Patient also underwent barium swallowing study which was unremarkable.  Since there was suspicion that patient has underlying obstructive sleep apnea, obesity hypoventilation syndrome, sleep study as outpatient to be performed was recommended upon discharge.  Patient was also having significant abdominal as well as back pain, maria of patient's lumbar spine was performed which revealed mild lower thoracic stenosis of T11-T12 but no lumbar stenosis was noted. It was felt that patient's back pain as well as abdominal pain was very likely due to chronic changes. Pain medications were advanced adding fentanyl patch and the pain clinic follow-up was recommended. Patient was seen by physical therapist who recommended home health services.  Discussion by problem 1. Acute respiratory failure with hypoxia Weaned off oxygen, tapered off steroids and continue NEBs 2. Bilateral pneumonia,Most prominent on the right side, was concerning for possible aspiration pneumonitis . Barium swallowing study, however, showed no esophageal dysmotility or gastroesophageal reflux disease. Discussed this patient extensively again and still recommended to elevate head at around 30 at nighttime . Speech therapist did not he feel she had oropharyngeal dysphagia. Continue Levaquin for 7 more days to complete course. Negative one blood culture. Urine culture was taken and it revealed multiple species, likely contamination. Sputum culture shows many gram-negative rods, also gram-positive cocci in pairs and clusters and a few gram arrival rods, ID is still pending  since 09/04/2014, suspect saliva. CT scan of chest  reveals mostly right-sided pneumonia, questionable aspiration pneumonitis, as above we will continue antibiotic therapy for prolonged course. Patient would benefit from sleep study as outpatient 3. Hyponatremia and ARF/dehydration. Resolved on normal saline, resume spironolactone, but not Naprosyn,resume Cozaar and Lasix. Kidney function improved. However, patient does have what looks like chronic renal insufficiency,  stable  4. type 2 diabetes. Uncontrolled at home. Continue higher dose of Lantus at 37 units, blood glucose levels 108 today in the morning. Good control on current medications  5. CML. F/u Dr. Ma Hillock as outpatient *  Hyperkalemia. Kayexalate given , potassium has normalized. Following BMP as outpatient, since patient is back on Cozaar or spironolactone.   * Constipation. Colace and MOM prn. Constipation has resolved * . Perineal candidiasis per report, given 150 mg of Diflucan once, no more complains *. Back pain, MRI of the lumbar spines howed chronic changes ,, continue pain management with fentanyl patch and get HH physical therapy * Anxiety, depression patient is to continue her outpatient medications and follow up with Dr. Weber Cooks as outpatient   VITAL SIGNS:  Blood pressure 116/59, pulse 67, temperature 97.9 F (36.6 C), temperature source Oral, resp. rate 18, height 5\' 7"  (1.702 m), weight 146.466 kg (322 lb 14.4 oz), SpO2 94 %.  I/O:   Intake/Output Summary (Last 24 hours) at 09/07/14 1130 Last data filed at 09/06/14 1810  Gross per 24 hour  Intake    120 ml  Output      0 ml  Net    120 ml    PHYSICAL EXAMINATION:  GENERAL:  59 y.o.-year-old patient lying in the bed with no acute distress.  EYES: Pupils equal, round, reactive to light and accommodation. No scleral icterus. Extraocular muscles intact.  HEENT: Head atraumatic, normocephalic. Oropharynx and nasopharynx clear.  NECK:  Supple, no jugular venous distention. No thyroid enlargement, no  tenderness.  LUNGS: Normal breath sounds bilaterally, no wheezing, rales,rhonchi or crepitation. No use of accessory muscles of respiration.  CARDIOVASCULAR: S1, S2 normal. No murmurs, rubs, or gallops.  ABDOMEN: Soft, non-tender, non-distended. Bowel sounds present. No organomegaly or mass.  EXTREMITIES: No pedal edema, cyanosis, or clubbing.  NEUROLOGIC: Cranial nerves II through XII are intact. Muscle strength 5/5 in all extremities. Sensation intact. Gait not checked.  PSYCHIATRIC: The patient is alert and oriented x 3.  SKIN: No obvious rash, lesion, or ulcer.   DATA REVIEW:   CBC  Recent Labs Lab 09/04/14 0420  WBC 9.5  HGB 9.9*  HCT 29.7*  PLT 207    Chemistries   Recent Labs Lab 08/31/14 1721  09/06/14 0728  NA 129*  < > 137  K 4.9  < > 3.8  CL 95*  < > 96*  CO2 26  < > 35*  GLUCOSE 143*  < > 119*  BUN 25*  < > 29*  CREATININE 1.33*  < > 1.03*  CALCIUM 7.9*  < > 9.3  AST 25  --   --   ALT 33  --   --   ALKPHOS 60  --   --   BILITOT 0.6  --   --   < > = values in this interval not displayed.  Cardiac Enzymes  Recent Labs Lab 08/31/14 1721  TROPONINI <0.03    Microbiology Results  Results for orders placed or performed during the hospital encounter of 08/31/14  Culture, blood (routine x 2)     Status: None   Collection Time: 08/31/14  5:21 PM  Result Value Ref Range Status   Specimen Description BLOOD RESISTANT ASSIST CONTROL  Final   Special Requests NONE  Final   Culture NO GROWTH 5 DAYS  Final   Report Status 09/05/2014 FINAL  Final  Urine culture     Status: None   Collection Time: 08/31/14  7:30 PM  Result Value Ref Range Status   Specimen Description URINE, CLEAN CATCH  Final   Special Requests NONE  Final   Culture   Final    MULTIPLE SPECIES PRESENT, SUGGEST RECOLLECTION IF CLINICALLY INDICATED  Report Status 09/02/2014 FINAL  Final  Culture, expectorated sputum-assessment     Status: None   Collection Time: 09/04/14  7:48 AM  Result  Value Ref Range Status   Specimen Description SPUTUM  Final   Special Requests Immunocompromised  Final   Sputum evaluation THIS SPECIMEN IS ACCEPTABLE FOR SPUTUM CULTURE  Final   Report Status 09/04/2014 FINAL  Final  Culture, respiratory (NON-Expectorated)     Status: None (Preliminary result)   Collection Time: 09/04/14  7:48 AM  Result Value Ref Range Status   Specimen Description SPUTUM  Final   Special Requests Immunocompromised Reflexed from [id number]  Final   Gram Stain   Final    FAIR SPECIMEN - 70-80% WBCS FEW WBC SEEN MANY GRAM NEGATIVE RODS MANY GRAM POSITIVE COCCI IN PAIRS IN CLUSTERS FEW GRAM VARIABLE ROD    Culture HOLDING FOR POSSIBLE PATHOGEN  Final   Report Status PENDING  Incomplete    RADIOLOGY:  Ct Chest Wo Contrast  09/05/2014   CLINICAL DATA:  Pneumonia ; hypoxia. History of chronic myelocytic leukemia.  EXAM: CT CHEST WITHOUT CONTRAST  TECHNIQUE: Multidetector CT imaging of the chest was performed following the standard protocol without IV contrast.  COMPARISON:  Chest CT May 04, 2014; chest radiograph August 31, 2014  FINDINGS: There is predominantly perihilar ground-glass type opacity bilaterally, significantly more on the right than on the left. There is patchy airspace consolidation in the right middle lobe as well as in the medial aspect of the posterior segment of the left lower lobe. There are no appreciable effusions are areas of interstitial fibrotic change. There is a small free-flowing effusion on the right.  Visualized thyroid appears normal. There are scattered small mediastinal lymph nodes. There is enlargement of a lymph node just anterior to the carina on the right measuring 2.1 x 1.5 cm. Pericardium is not thickened.  In the visualized upper abdomen, no lesions are identified.  There is degenerative change in the thoracic spine. There are no blastic or lytic bone lesions.  IMPRESSION: Areas of airspace consolidation in the right middle and left lower lobe  with patchy atelectatic change and ground-glass type opacity bilaterally in a somewhat perihilar distribution. There is overall significantly more ground-glass opacity and atelectatic change on the right side compared to the left. This appearance is most consistent with multifocal infectious pneumonia, although a degree of noncardiogenic edema or allergic type phenomenon could present with these findings. More than one of these entities may exist concurrently. There is a fairly small right pleural effusion. This effusion may have reactive etiology. A single prominent lymph node probably has reactive etiology.   Electronically Signed   By: Lowella Grip III M.D.   On: 09/05/2014 15:16   Mr Lumbar Spine Wo Contrast  09/05/2014   CLINICAL DATA:  59 year old female with twisting injury last week. Lumbar back pain radiating to both lower extremities with numbness burning and tingling. Prior spine surgery. Initial encounter.  EXAM: MRI LUMBAR SPINE WITHOUT CONTRAST  TECHNIQUE: Multiplanar, multisequence MR imaging of the lumbar spine was performed. No intravenous contrast was administered.  COMPARISON:  CT Abdomen and Pelvis 09/02/2010. Lumbar radiographs 09/01/2010.  FINDINGS: Chronic L2 through S1 level transpedicular hardware. Chronic interbody implant at L2-L3. Chronic interbody ankylosis at L1-L2. Mild to moderate associated spinal hardware susceptibility artifact. Vertebral height and alignment appears stable since 2012. No marrow edema or evidence of acute osseous abnormality.  No signal abnormality identified in the visualized lower thoracic spinal cord. Conus medullaris  at L1.  Negative visualized abdominal viscera. Postoperative changes to the posterior paraspinal soft tissues. Suggestion of small postoperative fluid collection in the L3 laminectomy space, but no mass effect on the thecal sac. See additional postoperative details below.  T11-T12: Circumferential disc bulge and mild to moderate posterior  element hypertrophy. Mild spinal stenosis (series 2, image 9).  T12-L1: Disc space loss and circumferential disc osteophyte complex with broad-based posterior component of disc. Mild facet and ligament flavum hypertrophy. No significant stenosis.  L1-L2: Interbody ankylosis with residual circumferential endplate osteophytosis. Mild facet hypertrophy greater on the right. Borderline to mild spinal stenosis appears stable since 2012.  L2-L3: Sequelae of decompression and fusion with residual endplate and posterior element osteophytosis. Up to mild L2 foraminal stenosis. No spinal or lateral recess stenosis.  L3-L4: Sequelae of decompression and fusion with residual endplate and posterior element osteophytosis. No spinal stenosis. No convincing lateral recess stenosis. Up to mild left L3 foraminal stenosis.  L4-L5: Sequelae of decompression and fusion with mild endplate and up to moderate residual facet hypertrophy. No convincing stenosis.  L5-S1: Sequelae of decompression and fusion. Residual endplate spurring and mild to moderate facet hypertrophy. No spinal or lateral recess stenosis. Up to mild L5 foraminal stenosis.  Visible sacrum is intact.  IMPRESSION: 1. No acute osseous abnormality identified in the lumbar spine. Chronic postoperative changes from the L1 to the S1 level. 2. 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 3. No lumbar spinal stenosis. Mild residual lumbar foraminal stenosis related to endplate and facet spurring.   Electronically Signed   By: Genevie Ann M.D.   On: 09/05/2014 19:18   Dg Esophagus  09/07/2014   CLINICAL DATA:  Dysphagia.  EXAM: ESOPHOGRAM/BARIUM SWALLOW  TECHNIQUE: Single contrast examination was performed using  thin.  FLUOROSCOPY TIME:  Radiation Exposure Index (as provided by the fluoroscopic device): 31.5 mGy  COMPARISON:  None.  FINDINGS: This is a limited exam due the patient's clinical condition. The esophagus is widely patent. No obstructing lesion. No  reflux demonstrated.  IMPRESSION: Normal limited exam.   Electronically Signed   By: Marcello Moores  Register   On: 09/07/2014 08:46    EKG:   Orders placed or performed in visit on 05/18/14  . EKG 12-Lead      Management plans discussed with the patient, family and they are in agreement.  CODE STATUS:     Code Status Orders        Start     Ordered   08/31/14 2001  Full code   Continuous     08/31/14 2001    Advance Directive Documentation        Most Recent Value   Type of Advance Directive  Healthcare Power of Attorney, Living will   Pre-existing out of facility DNR order (yellow form or pink MOST form)     "MOST" Form in Place?        TOTAL TIME TAKING CARE OF THIS PATIENT: 45  minutes.    Theodoro Grist M.D on 09/07/2014 at 11:30 AM  Between 7am to 6pm - Pager - [phone]  After 6pm go to [email or web address] - password [email or web address] [facility]  Office  [phone]  CC: Primary care physician; Valera Castle, MD

## 2014-09-07 NOTE — Progress Notes (Signed)
Received multiple calls overnight that patient wants to be given her dilaudid dose in conjunction with her tylenol #3 dose.  Patient states that she needs her dilaudid for severe pain, but that she also has a headache for which she needs tylenol #3.  Tylenol #3 is ordered for moderate pain, and dilaudid for severe pain.  Patient states her headache is like a migraine, for which she has her home dose fioricet ordered.  This write gave permission for her to have fioricet with the dilaudid, despite being NPO for a barium swallow.  Pt has been emotional and upset with nursing staff for the events listed above.  I have not authorized this patient to get tylenol #3 and dilaudid together, and she refuses to take fioricet.  She also at first refused to be NPO, stating she wanted the barium swallow cancelled, then stating she did want the test.  I encouraged nursing to explain to the patient that tylenol #3 is a narcotic as is dilaudid (with the latter being a much more potent medication), and that she could not receive a medication for moderate pain and severe pain at the same time, as he pain can only be rated in one category or the other.  Of note, the patient has codeine listed in her allergy list as and adverse medication reaction, which is the narcotic ingredient in tylenol #3.  Jacqulyn Bath, MD [facility] 09/07/2014, 4:25 AM

## 2014-09-07 NOTE — Plan of Care (Signed)
Problem: Discharge Progression Outcomes Goal: Other Discharge Outcomes/Goals Outcome: Progressing See previous comments related to pain. Pt up with standby assist and rolling walker. NPO for barium swallow. Emotionally distraught. Hx of panic disorder. Xanax once earlier on the shift. Zofran once for c/o nausea, no emesis noted.

## 2014-09-07 NOTE — Care Management (Signed)
Physical therapy evaluation completed. Recommends home with home health/physical therapy. Discussed home health agencies at the bedside. [place]. Will update Floydene Flock, [employer] representative.  Husband will transport. Shelbie Ammons RN MSN Care Management [phone]

## 2014-09-07 NOTE — Progress Notes (Addendum)
I talked with the pt. about Dr. Jannifer Franklin saying she needed to be NPO for procedure including no oral medications. Pt. states, "I know you can give it to me you are just being mean."  I explained to the pt. that we had to follow doctors orders and that nurses could not practice without a doctors order in place. Pt. states, "Just forget the test tomorrow, I want my pain medication for my headache."   I talked with Dr. Jannifer Franklin again and he reports he is not comfortable with the pt. Having Dilaudid and tylenol #3 as one is for severe pain and one is for moderate pain. When I shared this with the pt. she was upset saying, "They have been giving me both."

## 2014-09-07 NOTE — Progress Notes (Signed)
Spoke with Dr. Jannifer Franklin about pt's request for tylenol #3 for headache with her IV hydromorphone.  Pt is NPO for a morning procedure.  MD does not want pt to have oral medication at this time.  MD states that pt's IV hydromorphone will take care of her headache.  MD states that if pt have oral medication at this time she cannot have the procedure in the morning.  Colletta [place] Nurse Supervisor in room speaking with pt.  Clarise Cruz, RN

## 2014-09-08 ENCOUNTER — Inpatient Hospital Stay: Payer: Medicaid Other | Attending: Internal Medicine

## 2014-09-08 LAB — CULTURE, RESPIRATORY W GRAM STAIN

## 2014-09-08 LAB — CULTURE, RESPIRATORY: CULTURE: NORMAL

## 2014-09-09 ENCOUNTER — Other Ambulatory Visit: Payer: Self-pay | Admitting: Psychiatry

## 2014-09-09 LAB — CULTURE, BLOOD (ROUTINE X 2)

## 2014-09-13 ENCOUNTER — Telehealth: Payer: Self-pay | Admitting: *Deleted

## 2014-09-13 MED ORDER — FUROSEMIDE 40 MG PO TABS: 40.0000 mg | ORAL_TABLET | Freq: Every day | ORAL | Status: DC

## 2014-09-13 NOTE — Telephone Encounter (Signed)
Escribed

## 2014-09-16 ENCOUNTER — Telehealth: Payer: Self-pay | Admitting: Internal Medicine

## 2014-09-16 DIAGNOSIS — G4733 Obstructive sleep apnea (adult) (pediatric): Secondary | ICD-10-CM

## 2014-09-16 NOTE — Telephone Encounter (Signed)
Patient says that Sleep Study was ordered a few months ago, but she was too sick to do the test then.  Patient has been in the hospital for 7 days with pneumonia.  Doctor at hospital says that she may have sleep apnea.  She would like to go ahead and get the sleep study.  Patient has been choking and coughing in her sleep.  She was tested for reflux and that came back negative for reflux.   Sleep study re-entered to be scheduled.  FYI to Dr. Stevenson Clinch

## 2014-09-21 ENCOUNTER — Telehealth: Payer: Self-pay | Admitting: *Deleted

## 2014-09-21 NOTE — Telephone Encounter (Signed)
Asked pharmacy to send request to PMD

## 2014-09-27 ENCOUNTER — Other Ambulatory Visit: Payer: Self-pay | Admitting: Family Medicine

## 2014-09-27 DIAGNOSIS — Z1239 Encounter for other screening for malignant neoplasm of breast: Secondary | ICD-10-CM

## 2014-09-28 ENCOUNTER — Other Ambulatory Visit: Payer: Self-pay | Admitting: *Deleted

## 2014-09-28 ENCOUNTER — Telehealth: Payer: Self-pay

## 2014-09-28 DIAGNOSIS — C9210 Chronic myeloid leukemia, BCR/ABL-positive, not having achieved remission: Secondary | ICD-10-CM

## 2014-09-28 DIAGNOSIS — R059 Cough, unspecified: Secondary | ICD-10-CM

## 2014-09-28 DIAGNOSIS — R05 Cough: Secondary | ICD-10-CM

## 2014-09-28 DIAGNOSIS — R509 Fever, unspecified: Secondary | ICD-10-CM

## 2014-09-28 NOTE — Telephone Encounter (Signed)
Pt contacted and said she wanted to know when her appt was. And it was given to her.  She had home health nurse come today and she had fever, and coughing up green sputum after she did IS.  Per pt the nurse told her her lungs sounded about the same. Have ordered cxr that she was told that she needs to come in am 9:30 or 10 am and get xray in oupt area in [place]. No appt. Needed. Then come to cancer center to get her labs done and we will see results she is agreeable to plan.

## 2014-09-28 NOTE — Telephone Encounter (Signed)
Pt called in in wanting to know when her next appt was.  Scheduled for lab tomorrow in [place].  Pt then stated she needed to talk with Judeen Hammans about running a fever.  I asked pt what fever was and she stated it is 101.1 with additional complaints of coughing up mucus yellow and green in color overnight with no sleep.  Pt also stated she was also recently dc'd from the hospital for pneumonia.   Pt would like to know if you would see her today or tomorrow?

## 2014-09-29 ENCOUNTER — Inpatient Hospital Stay: Payer: Medicaid Other | Attending: Internal Medicine

## 2014-10-06 ENCOUNTER — Ambulatory Visit: Payer: Medicaid Other | Attending: Internal Medicine

## 2014-10-06 ENCOUNTER — Encounter: Payer: Self-pay | Admitting: Internal Medicine

## 2014-10-06 DIAGNOSIS — G4763 Sleep related bruxism: Secondary | ICD-10-CM | POA: Insufficient documentation

## 2014-10-06 DIAGNOSIS — G4761 Periodic limb movement disorder: Secondary | ICD-10-CM | POA: Diagnosis not present

## 2014-10-06 DIAGNOSIS — G4733 Obstructive sleep apnea (adult) (pediatric): Secondary | ICD-10-CM | POA: Diagnosis present

## 2014-10-18 ENCOUNTER — Other Ambulatory Visit: Payer: Self-pay | Admitting: *Deleted

## 2014-10-18 DIAGNOSIS — C9210 Chronic myeloid leukemia, BCR/ABL-positive, not having achieved remission: Secondary | ICD-10-CM

## 2014-10-18 MED ORDER — SPIRONOLACTONE 25 MG PO TABS: 25.0000 mg | ORAL_TABLET | Freq: Two times a day (BID) | ORAL | Status: DC

## 2014-10-20 ENCOUNTER — Telehealth: Payer: Self-pay | Admitting: Internal Medicine

## 2014-10-20 ENCOUNTER — Inpatient Hospital Stay: Payer: Medicaid Other

## 2014-10-20 NOTE — Telephone Encounter (Signed)
Pt called asking for her sleep study results. They are being faxed to [place]. Pt states the night she did the sleep study that the tech kept waking her up and then told her in the am that she didn't qualify for a CPAP. i informed pt to let you look at results and we would go from there. I will place in your folder. Told pt I would call back once you responded. Thanks.

## 2014-10-21 NOTE — Telephone Encounter (Signed)
Pt informed per VM that she doesn't need CPAP. Nothing further needed.

## 2014-10-27 ENCOUNTER — Other Ambulatory Visit: Payer: Self-pay | Admitting: Internal Medicine

## 2014-10-27 DIAGNOSIS — C9210 Chronic myeloid leukemia, BCR/ABL-positive, not having achieved remission: Secondary | ICD-10-CM

## 2014-11-01 ENCOUNTER — Telehealth: Payer: Self-pay | Admitting: Internal Medicine

## 2014-11-01 NOTE — Telephone Encounter (Signed)
She is running out of her Bonsulif med. She has less than a week left. Please call in new Rx. Thanks.

## 2014-11-01 NOTE — Telephone Encounter (Signed)
Called pt and told her to come in this week and have labs checked because she has missed a lot of lab appt and that is part of how we manage some of the side effects that she has and what labs are doing.  Also we need to to the lab check that tells [place] how the medication is doing with her CML control.  Dr. Ma Hillock does want me to refill her bosulif and I have called biologics and gave refill orders.  Will send Mickel Baas in [place] a message to change the labs to this week.

## 2014-11-03 ENCOUNTER — Inpatient Hospital Stay: Payer: Medicaid Other | Attending: Internal Medicine

## 2014-11-03 DIAGNOSIS — I10 Essential (primary) hypertension: Secondary | ICD-10-CM | POA: Diagnosis not present

## 2014-11-03 DIAGNOSIS — R609 Edema, unspecified: Secondary | ICD-10-CM | POA: Insufficient documentation

## 2014-11-03 DIAGNOSIS — C9210 Chronic myeloid leukemia, BCR/ABL-positive, not having achieved remission: Secondary | ICD-10-CM | POA: Insufficient documentation

## 2014-11-03 DIAGNOSIS — J449 Chronic obstructive pulmonary disease, unspecified: Secondary | ICD-10-CM | POA: Insufficient documentation

## 2014-11-03 DIAGNOSIS — J40 Bronchitis, not specified as acute or chronic: Secondary | ICD-10-CM | POA: Insufficient documentation

## 2014-11-03 DIAGNOSIS — E785 Hyperlipidemia, unspecified: Secondary | ICD-10-CM | POA: Diagnosis not present

## 2014-11-03 DIAGNOSIS — Z87891 Personal history of nicotine dependence: Secondary | ICD-10-CM | POA: Diagnosis not present

## 2014-11-03 DIAGNOSIS — Z79899 Other long term (current) drug therapy: Secondary | ICD-10-CM | POA: Insufficient documentation

## 2014-11-03 DIAGNOSIS — Z794 Long term (current) use of insulin: Secondary | ICD-10-CM | POA: Diagnosis not present

## 2014-11-03 DIAGNOSIS — E119 Type 2 diabetes mellitus without complications: Secondary | ICD-10-CM | POA: Diagnosis not present

## 2014-11-03 LAB — CBC WITH DIFFERENTIAL/PLATELET
BASOS ABS: 0 10*3/uL (ref 0–0.1)
BASOS PCT: 0 %
Eosinophils Absolute: 0.2 10*3/uL (ref 0–0.7)
Eosinophils Relative: 3 %
HEMATOCRIT: 33.7 % — AB (ref 35.0–47.0)
HEMOGLOBIN: 11.3 g/dL — AB (ref 12.0–16.0)
LYMPHS PCT: 20 %
Lymphs Abs: 1 10*3/uL (ref 1.0–3.6)
MCH: 28 pg (ref 26.0–34.0)
MCHC: 33.6 g/dL (ref 32.0–36.0)
MCV: 83.3 fL (ref 80.0–100.0)
MONOS PCT: 9 %
Monocytes Absolute: 0.5 10*3/uL (ref 0.2–0.9)
NEUTROS ABS: 3.4 10*3/uL (ref 1.4–6.5)
Neutrophils Relative %: 68 %
Platelets: 212 10*3/uL (ref 150–440)
RBC: 4.05 MIL/uL (ref 3.80–5.20)
RDW: 13.8 % (ref 11.5–14.5)
WBC: 5 10*3/uL (ref 3.6–11.0)

## 2014-11-03 LAB — HEPATIC FUNCTION PANEL
ALBUMIN: 3.1 g/dL — AB (ref 3.5–5.0)
ALK PHOS: 50 U/L (ref 38–126)
ALT: 98 U/L — AB (ref 14–54)
AST: 99 U/L — AB (ref 15–41)
Total Bilirubin: 0.3 mg/dL (ref 0.3–1.2)
Total Protein: 6.7 g/dL (ref 6.5–8.1)

## 2014-11-03 LAB — BASIC METABOLIC PANEL
ANION GAP: 7 (ref 5–15)
BUN: 21 mg/dL — ABNORMAL HIGH (ref 6–20)
CHLORIDE: 97 mmol/L — AB (ref 101–111)
CO2: 30 mmol/L (ref 22–32)
Calcium: 9.3 mg/dL (ref 8.9–10.3)
Creatinine, Ser: 0.99 mg/dL (ref 0.44–1.00)
GFR calc non Af Amer: >60 mL/min (ref >60)
Glucose, Bld: 182 mg/dL — ABNORMAL HIGH (ref 65–99)
Potassium: 4 mmol/L (ref 3.5–5.1)
Sodium: 134 mmol/L — ABNORMAL LOW (ref 135–145)

## 2014-11-04 ENCOUNTER — Other Ambulatory Visit: Payer: Self-pay | Admitting: Internal Medicine

## 2014-11-04 ENCOUNTER — Other Ambulatory Visit: Payer: Self-pay

## 2014-11-04 DIAGNOSIS — C9210 Chronic myeloid leukemia, BCR/ABL-positive, not having achieved remission: Secondary | ICD-10-CM

## 2014-11-06 ENCOUNTER — Other Ambulatory Visit: Payer: Self-pay | Admitting: Psychiatry

## 2014-11-07 ENCOUNTER — Other Ambulatory Visit: Payer: Self-pay | Admitting: *Deleted

## 2014-11-07 DIAGNOSIS — C9210 Chronic myeloid leukemia, BCR/ABL-positive, not having achieved remission: Secondary | ICD-10-CM

## 2014-11-09 LAB — BCR-ABL1, CML/ALL, PCR, QUANT: b3a2 transcript: 0.026 %

## 2014-11-10 ENCOUNTER — Inpatient Hospital Stay: Payer: Medicaid Other

## 2014-11-10 ENCOUNTER — Encounter: Payer: Self-pay | Admitting: Internal Medicine

## 2014-11-10 ENCOUNTER — Inpatient Hospital Stay (HOSPITAL_BASED_OUTPATIENT_CLINIC_OR_DEPARTMENT_OTHER): Payer: Medicaid Other | Admitting: Internal Medicine

## 2014-11-10 ENCOUNTER — Other Ambulatory Visit: Payer: Medicaid Other

## 2014-11-10 VITALS — BP 134/74 | HR 92 | Temp 97.6°F | Resp 18 | Ht 67.0 in | Wt 316.4 lb

## 2014-11-10 DIAGNOSIS — J40 Bronchitis, not specified as acute or chronic: Secondary | ICD-10-CM | POA: Diagnosis not present

## 2014-11-10 DIAGNOSIS — C9210 Chronic myeloid leukemia, BCR/ABL-positive, not having achieved remission: Secondary | ICD-10-CM

## 2014-11-10 DIAGNOSIS — R609 Edema, unspecified: Secondary | ICD-10-CM

## 2014-11-10 DIAGNOSIS — Z79899 Other long term (current) drug therapy: Secondary | ICD-10-CM

## 2014-11-10 LAB — HEPATIC FUNCTION PANEL
ALT: 91 U/L — ABNORMAL HIGH (ref 14–54)
AST: 90 U/L — AB (ref 15–41)
Albumin: 3.3 g/dL — ABNORMAL LOW (ref 3.5–5.0)
Alkaline Phosphatase: 54 U/L (ref 38–126)
BILIRUBIN TOTAL: 0.3 mg/dL (ref 0.3–1.2)
Bilirubin, Direct: 0.1 mg/dL (ref 0.1–0.5)
Indirect Bilirubin: 0.2 mg/dL — ABNORMAL LOW (ref 0.3–0.9)
Total Protein: 7.4 g/dL (ref 6.5–8.1)

## 2014-11-10 MED ORDER — LEVOFLOXACIN 500 MG PO TABS: 500.0000 mg | ORAL_TABLET | Freq: Every day | ORAL | Status: DC

## 2014-11-10 NOTE — Progress Notes (Signed)
Pt having chills and then sweats. She does not have fever today.  Coughing up yellow sputum.  Needs atb before she gets into pneumonia. She is taking the bosulif every day 3 pills.

## 2014-11-14 ENCOUNTER — Other Ambulatory Visit: Payer: Self-pay | Admitting: *Deleted

## 2014-11-14 DIAGNOSIS — C9210 Chronic myeloid leukemia, BCR/ABL-positive, not having achieved remission: Secondary | ICD-10-CM

## 2014-11-14 MED ORDER — SPIRONOLACTONE 25 MG PO TABS: 25.0000 mg | ORAL_TABLET | Freq: Two times a day (BID) | ORAL | Status: DC

## 2014-11-29 NOTE — Progress Notes (Signed)
[facility]  [phone] [phone]) [phone]     ID: Tara Levine OB: 1955/11/01  MR#: [id number]  MMN#:[id number]  Patient Care Team: Valera Castle, MD as PCP - General  CHIEF COMPLAINT/DIAGNOSIS:  Chronic myelogenous leukemia, chronic phase, diagnosed October 2011. Quantitative BCR-ABL in November 2011 -  b2a2 transcript 0.009%, b3a2 transcript  60.171%, e1a2 transcript 0.006%. On Tasigna since late October 2011. Changed to Bosulif end 2014/early 2015 due to progression on Tasigna.  May 2015 BCR transcripts undetectable including b3a2. Then dose of Bosulif was dropped to 200 mg daily due to abnormal LFTs. In Sept 2015, b3a2 increased to 0.052%. Patient advised to increase Bosulif to 300 mg daily but she took only 100 mg daily and on 03/02/14, the b3a2 further increased to 5.456%. Since then taking  Bosulif 300 mg daily.    HISTORY OF PRESENT ILLNESS:  Patient returns for continued oncology followup, she was seen few months ago. States that she is again having symptoms of bronchitis with persistent cough, yellowish sputum and is concerned that she may end up developing recurrent pneumonia. Otherwise denies any fevers or chills or pleuritic chest pain at this time. She continues to have same chronic symptoms of weight gain and sometimes loses weight after she diuresis. States that she is down about 6 pounds weight since July. States that she did not take Bosulif for about 2 weeks when she was hospitalized but since then has been taking it regularly after being discharged from hospital. She has missed a few scheduled appointment and lab monitoring. The BCR-ABL study in January 2016 had shown increased b3a2 transcript to 5.456% and we increased Bosulif dose from 100 mg to 300 mg daily (with continued monitoring of LFT). Subsequent BCR-ABL study on 07/28/14 had shown excellent response with all 3 transcripts being < 0.001%.  No fevers, chills, or night sweats. Chronic back  pain and arthritis are unchanged, no new bone pains. Eating well. No nausea, vomiting, or diarrhea. She has persistent generalized itching but no progressive rash.   REVIEW OF SYSTEMS:   ROS As in HPI above. In addition, no fevers or night sweats. No new headaches or focal weakness.  No sore throat or dysphagia. Denies new mood disturbances. No  sore throat or dysphagia. No hemoptysis or chest pain. No abdominal pain, constipation, diarrhea, dysuria or hematuria. No new skin rash or bleeding symptoms. No new paresthesias in extremities. Denies imbalance, false or loss of consciousness. PS ECOG 1.  PAST MEDICAL HISTORY: Reviewed Past Medical History  Diagnosis Date  . Unspecified essential hypertension   . HLD (hyperlipidemia)   . DM type 2 (diabetes mellitus, type 2)   . Environmental allergies   . Osteoarthritis   . IDA (iron deficiency anemia)     chronic  . History of gastric ulcer   . Panic disorder   . PSS (progressive systemic sclerosis)   . CML (chronic myelocytic leukemia)   . Leukemia, chronic myeloid   . COPD (chronic obstructive pulmonary disease)           Hypertension  Hyperlipidemia  Type II diabetes mellitus  Allergies  Osteoarthritis  Chronic iron deficiency anemia  History of gastric ulcer in the past  Panic disorder  Progressive spinal stenosis  Back and neck surgery x2 (1999, 2009 had rods placed)  Partial hysterectomy at age 42 (denies cancer)  PAST SURGICAL HISTORY:Reviewed Past Surgical History  Procedure Laterality Date  . Back and neck surgery  1999, 2009    had  rods placed  . Partial hysterectomy      age 50, no cancer  . Carpal tunnel release    . Cardiac catheterization  [facility]) No blackages found.     FAMILY HISTORY:Reviewed Family History  Problem Relation Age of Onset  . Diabetes Mother   . Hypertension Mother   . Heart disease Mother   . Prostate cancer Father   . Heart disease Father   . Diabetes  Brother   Remarkable for diabetes, hypertension, heart disease, prostate cancer.  Denies hematological disorders including leukemia.   ADVANCED DIRECTIVES:  Yes, educational materials given.  SOCIAL HISTORY: Reviewed Social History  Substance Use Topics  . Smoking status: Former Research scientist (life sciences)  . Smokeless tobacco: Never Used     Comment: quit 1985  . Alcohol Use: No  Remote history of smoking, quit in 1985.  Denies alcohol or recreational drug usage.   Allergies  Allergen Reactions  . Codeine Other (See Comments)    Reaction: Unknown   . Latex Other (See Comments)    Reaction:  Unknown   . Morphine And Related Other (See Comments)    Reaction:  Unknown   . Relafen [Nabumetone] Hives  . Tape Other (See Comments)    Reaction:  Unknown     Current Outpatient Prescriptions  Medication Sig Dispense Refill  . acetaminophen-codeine (TYLENOL #3) 300-30 MG per tablet Take 1 tablet by mouth every 12 (twelve) hours as needed for moderate pain.     Marland Kitchen albuterol (PROVENTIL HFA;VENTOLIN HFA) 108 (90 BASE) MCG/ACT inhaler Inhale 2 puffs into the lungs every 6 (six) hours as needed for wheezing or shortness of breath.    Marland Kitchen albuterol (PROVENTIL) (2.5 MG/3ML) 0.083% nebulizer solution Take 2.5 mg by nebulization every 6 (six) hours as needed for wheezing or shortness of breath.    . ALPRAZolam (XANAX) 1 MG tablet TAKE 1 TABLET 3 TIMES A DAY 90 tablet 1  . Ascorbic Acid (VITAMIN C) 1000 MG tablet Take 3,000 mg by mouth daily.    . bosutinib (BOSULIF) 100 MG tablet Take 300 mg by mouth daily.     . cetirizine (ZYRTEC) 10 MG tablet Take 20 mg by mouth daily.     . Cholecalciferol (VITAMIN D PO) Take 1 tablet by mouth daily.    . cyanocobalamin (,VITAMIN B-12,) 1000 MCG/ML injection Inject 1,000 mcg into the muscle every 30 (thirty) days.    . diphenhydrAMINE (BENADRYL) 25 MG tablet Take 25 mg by mouth every 6 (six) hours as needed for itching or allergies.     Marland Kitchen docusate sodium (COLACE) 100 MG capsule  Take 100 mg by mouth 2 (two) times daily as needed for mild constipation.     Marland Kitchen estradiol (ESTRACE) 2 MG tablet Take 2 mg by mouth daily.    . fluticasone (FLONASE) 50 MCG/ACT nasal spray Place 1-2 sprays into both nostrils as needed for rhinitis.     . furosemide (LASIX) 40 MG tablet Take 1 tablet (40 mg total) by mouth daily. 30 tablet 3  . insulin aspart (NOVOLOG) 100 UNIT/ML injection Inject 15 Units into the skin 3 (three) times daily as needed for high blood sugar.     . insulin glargine (LANTUS) 100 UNIT/ML injection Inject 0.37 mLs (37 Units total) into the skin daily. 10 mL 11  . losartan (COZAAR) 100 MG tablet Take 100 mg by mouth daily.    Marland Kitchen MAGNESIUM PO Take 4 tablets by mouth daily.     Marland Kitchen  montelukast (SINGULAIR) 10 MG tablet Take 10 mg by mouth at bedtime.    . Omega-3 Fatty Acids (OMEGA 3 PO) Take 3 capsules by mouth daily.    . pregabalin (LYRICA) 150 MG capsule Take 150 mg by mouth daily.    . traZODone (DESYREL) 150 MG tablet Take 150 mg by mouth at bedtime.     . vitamin B-12 (CYANOCOBALAMIN) 1000 MCG tablet Take 4,000 mcg by mouth daily.    . CYMBALTA 20 MG capsule TAKE 1 CAPSULE THREE TIMES A DAY 90 capsule 5  . levofloxacin (LEVAQUIN) 500 MG tablet Take 1 tablet (500 mg total) by mouth daily. 7 tablet 0  . spironolactone (ALDACTONE) 25 MG tablet Take 1 tablet (25 mg total) by mouth 2 (two) times daily. 60 tablet 0   No current facility-administered medications for this visit.    OBJECTIVE: Filed Vitals:   11/10/14 1153  BP: 134/74  Pulse: 92  Temp: 97.6 F (36.4 C)  Resp: 18     Body mass index is 49.54 kg/(m^2).    ECOG FS:1 - Symptomatic but completely ambulatory  GENERAL: Alert and oriented and in no acute distress. No icterus. HEENT: EOMs intact. Oral exam negative for thrush or lesions. No cervical lymphadenopathy. CVS: S1S2, regular LUNGS: Bilaterally clear to auscultation, no crepitations or rhonchi. ABDOMEN: Soft, nontender. No hepatosplenomegaly  clinically.  NEURO: grossly nonfocal, cranial nerves are intact.   EXTREMITIES: trace pedal edema.   LAB RESULTS:    Component Value Date/Time   NA 134* 11/03/2014 1125   NA 136 03/02/2014 1411   K 4.0 11/03/2014 1125   K 4.0 03/02/2014 1411   CL 97* 11/03/2014 1125   CL 97* 03/02/2014 1411   CO2 30 11/03/2014 1125   CO2 32 03/02/2014 1411   GLUCOSE 182* 11/03/2014 1125   GLUCOSE 141* 03/02/2014 1411   BUN 21* 11/03/2014 1125   BUN 18 03/02/2014 1411   CREATININE 0.99 11/03/2014 1125   CREATININE 1.26 03/02/2014 1411   CALCIUM 9.3 11/03/2014 1125   CALCIUM 9.1 03/02/2014 1411   PROT 7.4 11/10/2014 1115   PROT 7.1 03/02/2014 1411   ALBUMIN 3.3* 11/10/2014 1115   ALBUMIN 2.9* 03/02/2014 1411   AST 90* 11/10/2014 1115   AST 17 03/02/2014 1411   ALT 91* 11/10/2014 1115   ALT 21 03/02/2014 1411   ALKPHOS 54 11/10/2014 1115   ALKPHOS 69 03/02/2014 1411   BILITOT 0.3 11/10/2014 1115   BILITOT 0.4 03/02/2014 1411   GFRNONAA >60 11/03/2014 1125   GFRNONAA 46* 03/02/2014 1411   GFRNONAA >60 11/11/2013 1451   GFRAA >60 11/03/2014 1125   GFRAA 56* 03/02/2014 1411   GFRAA >60 11/11/2013 1451   Lab Results  Component Value Date   WBC 5.0 11/03/2014   NEUTROABS 3.4 11/03/2014   HGB 11.3* 11/03/2014   HCT 33.7* 11/03/2014   MCV 83.3 11/03/2014   PLT 212 11/03/2014   ANC 3.4. 11/10/14 - AST 90, ALT 91, bilirubin 0.3, alkaline phosphatase 54, albumin 3.3   STUDIES: 07/21/13 -   BCR-ABL1, CML/ALL, PCR, Quant b2a2 transcript <0.001 %  (was <0.001 % in Nov 2014) b3a2 transcript <0.001 %  (was  0.548 % in Nov 2014) e1a2 transcript <0.001 %  (was <0.001 % in Nov 2014)  11/01/13 - b3a2 increased to 0.052%.  Otherwise b2a2 and e1a2 < 0.001%. 03/02/14 - b3a2 further increased to 5.456%.  Otherwise b2a2 and e1a2 < 0.001%.  07/28/14 - BCR-ABL study shows b2a2, b3a2, e1a2 all <  0.001%.  07/28/14 - BCR-ABL 1 kinase domain analysis reports:  Very low amount of b3a2 transcript (0.022%, this  is below the major molecular response level 0.1%) was detected in this specimen. Recommend to monitor the b3a2 transcript level using the quantitative BCR/ABL1 PCR test.  11/03/14 - BCR-ABL1, CML/ALL, PCR, Quant b2a2 transcript <0.001 %  (was <0.001 % in Nov 2014) b3a2 transcript   0.026 %  (was  0.548 % in Nov 2014) e1a2 transcript <0.001 %  (was <0.001 % in Nov 2014)   ASSESSMENT / PLAN:   1. CML (chronic myelogenous leukemia, chronic phase). Peripheral blood BCR-ABL assays on 05/25/12 showed response to Tasigna with b3a2 transcript down to 0.004% but then had increased back up to 0.575% in July 2014. BCR-ABL in Nov 2014 showed increase in b3a2 transcript to 0.548% and she was changed to [facility], patient however herself changed treatment back to [place] on 03/12/13 shortly after which BCR-ABL on 03/25/13 showed the b3a2 transcript was down to 0.045% which likely was secondary to [facility] treatment. This was then discussed with patient over the phone, and she switched treatment back to Bosulif 400 mg daily on 04/02/13. Repeat BCR-ABL study in May showed complete molecular response. She however has had issues with abnnormal LFTs and Bosulif dose has been reduced.  In Sept 2015, b3a2 increased to 0.052%. Patient advised to increase Bosulif to 300 mg daily but she took only 100 mg daily and on 03/02/14, the b3a2 further increased to 5.456%. Since then taking  Bosulif 300 mg daily -    Have reviewed labs done on September 8 and also liver functions done today, and d/w patient. She is currently on Bosulif 300 mg p.o. daily, liver transaminases are doing fairly well and AST/ALT continue to remain slightly elevated but steady and other liver function tests are unremarkable including bilirubin. Recent BCR-ABL study showed that the b3a2 transcript was again detectable at a very low level but still below the major molecular response level of 0.1%. Patient does not want to increase Bosulif dose to 400 mg daily. Plan at this  time is to continue on current dose of Bosulif at 300 mg daily. Will continue to monitor CBC, met-B, phosphorus, LFT, magnesium once every 3 weeks. Will get repeat BCR-ABL at 9 weeks. Next MD followup at 12 weeks with repeat labs. Patient explained that we may need to adjust dose of Bosulif based on liver function tests and also response to therapy or consider changing to a different agent for CML treatment if indicated.  2. Hypomagnesemia, hypophosphatemia -  better. Continue to monitor and supplement accordingly. 3. Fluid retention, weight gain - continue current dose of diuretics. 4. Abnormal liver transaminases - Plan is as mentioned above, continue close monitoring. 5. Bronchitis - has history of recurrent pneumonia. Will treat with empiric Levaquin 500 mg by mouth daily 7. 6. In between visits, patient advised to call or come to ER in case of any fevers, sickness, bleeding, or new symptoms. She is agreeable to this plan.     Leia Alf, MD   11/29/2014 5:20 AM

## 2014-12-01 ENCOUNTER — Inpatient Hospital Stay: Payer: Medicaid Other | Attending: Internal Medicine

## 2014-12-01 DIAGNOSIS — F329 Major depressive disorder, single episode, unspecified: Secondary | ICD-10-CM | POA: Insufficient documentation

## 2014-12-01 DIAGNOSIS — Z79899 Other long term (current) drug therapy: Secondary | ICD-10-CM | POA: Insufficient documentation

## 2014-12-01 DIAGNOSIS — C9210 Chronic myeloid leukemia, BCR/ABL-positive, not having achieved remission: Secondary | ICD-10-CM | POA: Insufficient documentation

## 2014-12-01 DIAGNOSIS — I10 Essential (primary) hypertension: Secondary | ICD-10-CM | POA: Insufficient documentation

## 2014-12-01 DIAGNOSIS — E119 Type 2 diabetes mellitus without complications: Secondary | ICD-10-CM | POA: Insufficient documentation

## 2014-12-01 DIAGNOSIS — E785 Hyperlipidemia, unspecified: Secondary | ICD-10-CM | POA: Insufficient documentation

## 2014-12-01 DIAGNOSIS — Z794 Long term (current) use of insulin: Secondary | ICD-10-CM | POA: Insufficient documentation

## 2014-12-01 DIAGNOSIS — J449 Chronic obstructive pulmonary disease, unspecified: Secondary | ICD-10-CM | POA: Insufficient documentation

## 2014-12-01 DIAGNOSIS — Z87891 Personal history of nicotine dependence: Secondary | ICD-10-CM | POA: Insufficient documentation

## 2014-12-08 ENCOUNTER — Telehealth: Payer: Self-pay | Admitting: *Deleted

## 2014-12-08 NOTE — Telephone Encounter (Signed)
Pt called and wants to get switched from Bosulif to [place].  She states that she has read up on gleevec and she will have less side effects.  She states that she has read that it does not affect your good cells.  I explained to her that every med that she would be on for CML would make her tired and all of them will affect blood counts.  She also wants to know if she can have pneumonia and flu shot.  I told her that I can review our records and not sure if she needs pneumonia shot if she has had one within 5 years.  She tells me that she gets all that done at our office and I told her I would check on both things and call her back. I reviewed the old system and the new system all the way back to 2011 and the only vaccine she had rcvd is the flu vaccine and the last one given was 2014.  Dr. B said that he would be glad to see her and discuss the matter but would not make any promises.  I did check with Dr. Jacinto Reap and he does want her to get the labs ordered by Dr. Ma Hillock but she did come and get them last Thursday.  I have entered them and pt called and told that she can come on tues. To [place].  Be there 10:15 am for labs and 10:30 for f/u.  She did tell me that she is jaundice and I told her if she was yellow like  A banana peel and if her eyes were like that she needs to go to ER.  She states her color is around her eyes but not her actual eyes and then she states it is yellow gray.  She also states that she stopped her bosulif and can't remember when but to my best detective work it was sometime last week and she feels better off the med.  She states from the time she saw pandit on 9/15 and she was sick that day and felt like she was getting pneumonia again and pandit did give her levaquin that day and she got better but then she got so tired that she went to sleep on Sunday and did not get out of bed until wed.  She then said she had water, crackers and peanut butter.  She would get up and go to the bathroom when  needed. She knows her liver enzymes are elevated and that is why she feels bad.  I told her that her liver enzymes were elevated but they are not out of normal to cause jaundice.  She then started crying because she is missing pandit and she wants me to be her nurse and I will not be there in [place] on tues..  I assured her that she is in good hands with caring and knowledgeable people.

## 2014-12-12 ENCOUNTER — Inpatient Hospital Stay: Payer: Medicaid Other

## 2014-12-12 DIAGNOSIS — F329 Major depressive disorder, single episode, unspecified: Secondary | ICD-10-CM | POA: Diagnosis not present

## 2014-12-12 DIAGNOSIS — I10 Essential (primary) hypertension: Secondary | ICD-10-CM | POA: Diagnosis not present

## 2014-12-12 DIAGNOSIS — E119 Type 2 diabetes mellitus without complications: Secondary | ICD-10-CM | POA: Diagnosis not present

## 2014-12-12 DIAGNOSIS — Z79899 Other long term (current) drug therapy: Secondary | ICD-10-CM | POA: Diagnosis not present

## 2014-12-12 DIAGNOSIS — Z794 Long term (current) use of insulin: Secondary | ICD-10-CM | POA: Diagnosis not present

## 2014-12-12 DIAGNOSIS — J449 Chronic obstructive pulmonary disease, unspecified: Secondary | ICD-10-CM | POA: Diagnosis not present

## 2014-12-12 DIAGNOSIS — E785 Hyperlipidemia, unspecified: Secondary | ICD-10-CM | POA: Diagnosis not present

## 2014-12-12 DIAGNOSIS — Z87891 Personal history of nicotine dependence: Secondary | ICD-10-CM | POA: Diagnosis not present

## 2014-12-12 DIAGNOSIS — C9210 Chronic myeloid leukemia, BCR/ABL-positive, not having achieved remission: Secondary | ICD-10-CM | POA: Diagnosis present

## 2014-12-12 LAB — HEPATIC FUNCTION PANEL
ALK PHOS: 46 U/L (ref 38–126)
ALT: 24 U/L (ref 14–54)
AST: 32 U/L (ref 15–41)
Albumin: 3.3 g/dL — ABNORMAL LOW (ref 3.5–5.0)
BILIRUBIN TOTAL: 0.4 mg/dL (ref 0.3–1.2)
Total Protein: 6.8 g/dL (ref 6.5–8.1)

## 2014-12-12 LAB — CBC WITH DIFFERENTIAL/PLATELET
Basophils Absolute: 0 10*3/uL (ref 0–0.1)
Basophils Relative: 0 %
Eosinophils Absolute: 0.1 10*3/uL (ref 0–0.7)
Eosinophils Relative: 2 %
HCT: 36.5 % (ref 35.0–47.0)
Hemoglobin: 12.2 g/dL (ref 12.0–16.0)
LYMPHS PCT: 22 %
Lymphs Abs: 1.3 10*3/uL (ref 1.0–3.6)
MCH: 27.8 pg (ref 26.0–34.0)
MCHC: 33.3 g/dL (ref 32.0–36.0)
MCV: 83.5 fL (ref 80.0–100.0)
MONO ABS: 0.5 10*3/uL (ref 0.2–0.9)
MONOS PCT: 8 %
NEUTROS ABS: 3.9 10*3/uL (ref 1.4–6.5)
Neutrophils Relative %: 68 %
Platelets: 247 10*3/uL (ref 150–440)
RBC: 4.37 MIL/uL (ref 3.80–5.20)
RDW: 14.1 % (ref 11.5–14.5)
WBC: 5.8 10*3/uL (ref 3.6–11.0)

## 2014-12-12 LAB — PHOSPHORUS: Phosphorus: 3.6 mg/dL (ref 2.5–4.6)

## 2014-12-12 LAB — CREATININE, SERUM
Creatinine, Ser: 1.54 mg/dL — ABNORMAL HIGH (ref 0.44–1.00)
GFR calc Af Amer: 42 mL/min — ABNORMAL LOW (ref >60)
GFR calc non Af Amer: 36 mL/min — ABNORMAL LOW (ref >60)

## 2014-12-12 LAB — MAGNESIUM: Magnesium: 1.7 mg/dL (ref 1.7–2.4)

## 2014-12-13 ENCOUNTER — Other Ambulatory Visit: Payer: Medicaid Other

## 2014-12-13 ENCOUNTER — Inpatient Hospital Stay (HOSPITAL_BASED_OUTPATIENT_CLINIC_OR_DEPARTMENT_OTHER): Payer: Medicaid Other | Admitting: Internal Medicine

## 2014-12-13 ENCOUNTER — Inpatient Hospital Stay: Payer: Medicaid Other

## 2014-12-13 ENCOUNTER — Encounter: Payer: Self-pay | Admitting: Internal Medicine

## 2014-12-13 VITALS — BP 119/69 | HR 92 | Temp 97.7°F | Resp 18 | Ht 67.0 in | Wt 308.6 lb

## 2014-12-13 DIAGNOSIS — C9210 Chronic myeloid leukemia, BCR/ABL-positive, not having achieved remission: Secondary | ICD-10-CM

## 2014-12-13 DIAGNOSIS — Z79899 Other long term (current) drug therapy: Secondary | ICD-10-CM | POA: Diagnosis not present

## 2014-12-13 NOTE — Progress Notes (Signed)
In [place] OFFICE PROGRESS NOTE  Patient Care Team: Valera Castle, MD as PCP - General   SUMMARY OF ONCOLOGIC HISTORY:  # OCT 2011- CML- CHRONIC PHASE [b3a2-60%; Incidental] NOV 2011- TASIGNA   # DEC 2014-JAN 2015-PROGRESSION [Increase in bcr-abl; ? Non-compliance/SE-hypomag]; START BOSULIF [no Mutation analysis done] [400mg ]; May 2015- bcr-abl-Undetectable; 200mg  [AE-LFTs] SEP 2015- bcr-abl- Increase 0.052%; 100mg  [non-compliance] JAN 2016 bcr-abl Increased 5.456%; 300mg ; June 2016- bcr-abl <0.001%; STOPPED Bosulif end SEP 2016 [non-compliance]  INTERVAL HISTORY:  This is my first interaction with the patient since I joined the practice September 2016. I reviewed the patient's prior chart/pertinent labs/imaging in detail; findings are summarized.  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  Patient states that she has significant detriment in the quality of life on Bosulif- multiple vague side effects- grayish discoloration of the eyes; extreme fatigue [to a point that she could not get on the bed]; extreme nausea/sickness to stomach. She is also concerned about weight gain. She denies any significant diarrhea. She decided to stop taking the medication.  She states that she has read about imatinib "helping to lose weight".   Patient is chronic cough with sputum. This is not new. No new shortness of breath or chest pain. Denies any skin rash.  REVIEW OF SYSTEMS:  A complete 10 point review of system is done which is negative except mentioned above/history of present illness.   PAST MEDICAL HISTORY :  Past Medical History  Diagnosis Date  . Unspecified essential hypertension   . HLD (hyperlipidemia)   . DM type 2 (diabetes mellitus, type 2) ([facility])   . Environmental allergies   . Osteoarthritis   . IDA (iron deficiency anemia)      chronic  . History of gastric ulcer   . Panic disorder   . PSS (progressive systemic sclerosis) ([place])   . COPD (chronic obstructive pulmonary disease) ([place])   . CML (chronic myelocytic leukemia) ([place])   . Leukemia, chronic myeloid ([place])   . Depression     PAST SURGICAL HISTORY :   Past Surgical History  Procedure Laterality Date  . Back and neck surgery  1999, 2009    had rods placed  . Partial hysterectomy      age 89, no cancer  . Carpal tunnel release    . Cardiac catheterization  [name]) No blackages found.     FAMILY HISTORY :   Family History  Problem Relation Age of Onset  . Diabetes Mother   . Hypertension Mother   . Heart disease Mother   . Prostate cancer Father   . Heart disease Father   . Diabetes Brother     SOCIAL HISTORY:   Social History  Substance Use Topics  . Smoking status: Former Research scientist (life sciences)  . Smokeless tobacco: Never Used     Comment: quit 1985  . Alcohol Use: No    ALLERGIES:  is allergic to codeine; latex; morphine and related; relafen; and tape.  MEDICATIONS:  Current Outpatient Prescriptions  Medication Sig Dispense Refill  . acetaminophen-codeine (TYLENOL #3) 300-30 MG per tablet Take 1 tablet by mouth every 12 (twelve) hours as needed for moderate pain.     Marland Kitchen albuterol (PROVENTIL HFA;VENTOLIN HFA) 108 (90 BASE) MCG/ACT inhaler Inhale 2 puffs into the lungs every 6 (six) hours as needed for wheezing or shortness  of breath.    . ALPRAZolam (XANAX) 1 MG tablet TAKE 1 TABLET 3 TIMES A DAY 90 tablet 1  . Ascorbic Acid (VITAMIN C) 1000 MG tablet Take 3,000 mg by mouth daily.    . cetirizine (ZYRTEC) 10 MG tablet Take 20 mg by mouth daily.     . Cholecalciferol (VITAMIN D PO) Take 1 tablet by mouth daily.    Marland Kitchen co-enzyme Q-10 50 MG capsule Take 50 mg by mouth daily.    . cyanocobalamin (,VITAMIN B-12,) 1000 MCG/ML injection Inject 1,000 mcg into the muscle every 30 (thirty) days.    . CYMBALTA 20 MG capsule TAKE  1 CAPSULE THREE TIMES A DAY 90 capsule 5  . diphenhydrAMINE (BENADRYL) 25 MG tablet Take 25 mg by mouth every 6 (six) hours as needed for itching or allergies.     Marland Kitchen docusate sodium (COLACE) 100 MG capsule Take 100 mg by mouth 2 (two) times daily as needed for mild constipation.     Marland Kitchen estradiol (ESTRACE) 2 MG tablet Take 2 mg by mouth daily.    . furosemide (LASIX) 40 MG tablet Take 1 tablet (40 mg total) by mouth daily. 30 tablet 3  . insulin aspart (NOVOLOG) 100 UNIT/ML injection Inject 15 Units into the skin 3 (three) times daily as needed for high blood sugar.     . losartan (COZAAR) 100 MG tablet Take 100 mg by mouth daily.    Marland Kitchen MAGNESIUM PO Take 4 tablets by mouth daily.     . montelukast (SINGULAIR) 10 MG tablet Take 10 mg by mouth at bedtime.    . Omega-3 Fatty Acids (OMEGA 3 PO) Take 3 capsules by mouth daily.    . pregabalin (LYRICA) 150 MG capsule Take 150 mg by mouth daily.    Marland Kitchen spironolactone (ALDACTONE) 25 MG tablet Take 1 tablet (25 mg total) by mouth 2 (two) times daily. 60 tablet 0  . traZODone (DESYREL) 150 MG tablet Take 150 mg by mouth at bedtime.     . vitamin B-12 (CYANOCOBALAMIN) 1000 MCG tablet Take 4,000 mcg by mouth daily.    . bosutinib (BOSULIF) 100 MG tablet Take 300 mg by mouth daily.     . fluticasone (FLONASE) 50 MCG/ACT nasal spray Place 1-2 sprays into both nostrils as needed for rhinitis.     Marland Kitchen insulin glargine (LANTUS) 100 UNIT/ML injection Inject 0.37 mLs (37 Units total) into the skin daily. (Patient not taking: Reported on 12/13/2014) 10 mL 11   No current facility-administered medications for this visit.    PHYSICAL EXAMINATION: ECOG PERFORMANCE STATUS: 1 - Symptomatic but completely ambulatory  BP 119/69 mmHg  Pulse 92  Temp(Src) 97.7 F (36.5 C)  Resp 18  Ht 5\' 7"  (1.702 m)  Wt 308 lb 10.3 oz (140 kg)  BMI 48.33 kg/m2  Filed Weights   12/13/14 1049  Weight: 308 lb 10.3 oz (140 kg)    GENERAL: Well-nourished well-developed; Alert, no  distress and comfortable.  Morbidly obese. She is alone. EYES: no pallor or icterus OROPHARYNX: no thrush or ulceration; good dentition  NECK: supple, no masses felt LYMPH:  no palpable lymphadenopathy in the cervical, axillary or inguinal regions LUNGS: clear to auscultation and  No wheeze or crackles HEART/CVS: regular rate & rhythm and no murmurs; No lower extremity edema ABDOMEN:abdomen soft, non-tender and normal bowel sounds Musculoskeletal:no cyanosis of digits and no clubbing  PSYCH: alert & oriented x 3 with fluent speech NEURO: no focal motor/sensory deficits SKIN:  no rashes  or significant lesions  LABORATORY DATA:  I have reviewed the data as listed    Component Value Date/Time   NA 134* 11/03/2014 1125   NA 136 03/02/2014 1411   K 4.0 11/03/2014 1125   K 4.0 03/02/2014 1411   CL 97* 11/03/2014 1125   CL 97* 03/02/2014 1411   CO2 30 11/03/2014 1125   CO2 32 03/02/2014 1411   GLUCOSE 182* 11/03/2014 1125   GLUCOSE 141* 03/02/2014 1411   BUN 21* 11/03/2014 1125   BUN 18 03/02/2014 1411   CREATININE 1.54* 12/12/2014 1013   CREATININE 1.26 03/02/2014 1411   CALCIUM 9.3 11/03/2014 1125   CALCIUM 9.1 03/02/2014 1411   PROT 6.8 12/12/2014 1013   PROT 7.1 03/02/2014 1411   ALBUMIN 3.3* 12/12/2014 1013   ALBUMIN 2.9* 03/02/2014 1411   AST 32 12/12/2014 1013   AST 17 03/02/2014 1411   ALT 24 12/12/2014 1013   ALT 21 03/02/2014 1411   ALKPHOS 46 12/12/2014 1013   ALKPHOS 69 03/02/2014 1411   BILITOT 0.4 12/12/2014 1013   BILITOT 0.4 03/02/2014 1411   GFRNONAA 36* 12/12/2014 1013   GFRNONAA 46* 03/02/2014 1411   GFRNONAA >60 11/11/2013 1451   GFRAA 42* 12/12/2014 1013   GFRAA 56* 03/02/2014 1411   GFRAA >60 11/11/2013 1451    No results found for: SPEP, UPEP  Lab Results  Component Value Date   WBC 5.8 12/12/2014   NEUTROABS 3.9 12/12/2014   HGB 12.2 12/12/2014   HCT 36.5 12/12/2014   MCV 83.5 12/12/2014   PLT 247 12/12/2014      Chemistry       Component Value Date/Time   NA 134* 11/03/2014 1125   NA 136 03/02/2014 1411   K 4.0 11/03/2014 1125   K 4.0 03/02/2014 1411   CL 97* 11/03/2014 1125   CL 97* 03/02/2014 1411   CO2 30 11/03/2014 1125   CO2 32 03/02/2014 1411   BUN 21* 11/03/2014 1125   BUN 18 03/02/2014 1411   CREATININE 1.54* 12/12/2014 1013   CREATININE 1.26 03/02/2014 1411      Component Value Date/Time   CALCIUM 9.3 11/03/2014 1125   CALCIUM 9.1 03/02/2014 1411   ALKPHOS 46 12/12/2014 1013   ALKPHOS 69 03/02/2014 1411   AST 32 12/12/2014 1013   AST 17 03/02/2014 1411   ALT 24 12/12/2014 1013   ALT 21 03/02/2014 1411   BILITOT 0.4 12/12/2014 1013   BILITOT 0.4 03/02/2014 1411       RADIOGRAPHIC STUDIES: I have personally reviewed the radiological images as listed and agreed with the findings in the report. No results found.   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 When patient on 300 mg dose of Bosulif; since January 2016- BCR ABL transcript in June 2016 being less than 0.001%.   # September 2016- B3 A2- 0.026% [increasing]; I suspect noncompliance  #  Patient stopped taking Bosulif end of September 2016 secondary to intolerance.  I would recommend checking BCR ABL mutation analysis. Plan to start her on tyrosine kinase inhibitor based on the results of the mutation analysis. Patient is interested in Imatinib as she is convinced that this will help her lose weight. However I had cautioned her that all the medications have multiple side effects- including weight gain; and imatinib is no such exception.  Patient will follow-up with me in approximately 2 weeks; discuss the results of the above testing.     Orders Placed This Encounter  Procedures  . BCR-ABL1 Kinase domain mutation analysis    Standing Status: Future      Number of Occurrences:      Standing Expiration Date: 12/13/2015   All questions were answered. The patient knows to call the clinic with any problems, questions or concerns. No barriers to learning was detected. I spent 40 minutes counseling the patient face to face. The total time spent in the appointment was 55 minutes and more than 50% was on counseling and review of test results     Cammie Sickle, MD 12/13/2014 11:20 AM

## 2014-12-13 NOTE — Progress Notes (Signed)
Pt here to discuss changing from bosulif to gleevec.  She feels that she would do better on gleevec.  She states she stopped the bosulif shortly after her 11/10/14 visit. Chronic pain from back surgery with rod placement.

## 2014-12-14 ENCOUNTER — Other Ambulatory Visit: Payer: Self-pay | Admitting: Psychiatry

## 2014-12-19 LAB — BCR-ABL1 KINASE DOMAIN MUTATION ANALYSIS

## 2014-12-22 ENCOUNTER — Inpatient Hospital Stay: Payer: Medicaid Other

## 2014-12-22 ENCOUNTER — Ambulatory Visit (INDEPENDENT_AMBULATORY_CARE_PROVIDER_SITE_OTHER): Payer: Self-pay | Admitting: Psychiatry

## 2014-12-22 ENCOUNTER — Encounter: Payer: Self-pay | Admitting: Psychiatry

## 2014-12-22 VITALS — BP 132/88 | HR 104 | Temp 97.9°F | Ht 67.0 in | Wt 307.8 lb

## 2014-12-22 DIAGNOSIS — F3130 Bipolar disorder, current episode depressed, mild or moderate severity, unspecified: Secondary | ICD-10-CM

## 2014-12-22 DIAGNOSIS — F4310 Post-traumatic stress disorder, unspecified: Secondary | ICD-10-CM

## 2014-12-22 MED ORDER — TRAZODONE HCL 150 MG PO TABS: 150.0000 mg | ORAL_TABLET | Freq: Every day | ORAL | Status: DC

## 2014-12-22 MED ORDER — PREGABALIN 150 MG PO CAPS: 300.0000 mg | ORAL_CAPSULE | Freq: Every day | ORAL | Status: DC

## 2014-12-22 MED ORDER — CYMBALTA 20 MG PO CPEP: 20.0000 mg | ORAL_CAPSULE | Freq: Three times a day (TID) | ORAL | Status: DC

## 2014-12-22 MED ORDER — ALPRAZOLAM 1 MG PO TABS: 1.0000 mg | ORAL_TABLET | Freq: Three times a day (TID) | ORAL | Status: DC

## 2014-12-22 MED ORDER — DULOXETINE HCL 20 MG PO CPEP: 20.0000 mg | ORAL_CAPSULE | Freq: Three times a day (TID) | ORAL | Status: DC

## 2014-12-27 ENCOUNTER — Inpatient Hospital Stay: Payer: Medicaid Other | Attending: Internal Medicine | Admitting: Internal Medicine

## 2014-12-27 VITALS — BP 138/73 | HR 91 | Temp 97.8°F | Resp 18 | Ht 67.0 in | Wt 308.4 lb

## 2014-12-27 DIAGNOSIS — Z9119 Patient's noncompliance with other medical treatment and regimen: Secondary | ICD-10-CM | POA: Insufficient documentation

## 2014-12-27 DIAGNOSIS — R05 Cough: Secondary | ICD-10-CM | POA: Diagnosis not present

## 2014-12-27 DIAGNOSIS — Z79899 Other long term (current) drug therapy: Secondary | ICD-10-CM | POA: Insufficient documentation

## 2014-12-27 DIAGNOSIS — F329 Major depressive disorder, single episode, unspecified: Secondary | ICD-10-CM | POA: Insufficient documentation

## 2014-12-27 DIAGNOSIS — R093 Abnormal sputum: Secondary | ICD-10-CM | POA: Insufficient documentation

## 2014-12-27 DIAGNOSIS — E785 Hyperlipidemia, unspecified: Secondary | ICD-10-CM | POA: Insufficient documentation

## 2014-12-27 DIAGNOSIS — C9210 Chronic myeloid leukemia, BCR/ABL-positive, not having achieved remission: Secondary | ICD-10-CM | POA: Diagnosis not present

## 2014-12-27 DIAGNOSIS — J449 Chronic obstructive pulmonary disease, unspecified: Secondary | ICD-10-CM | POA: Diagnosis not present

## 2014-12-27 DIAGNOSIS — R635 Abnormal weight gain: Secondary | ICD-10-CM | POA: Diagnosis not present

## 2014-12-27 DIAGNOSIS — Z794 Long term (current) use of insulin: Secondary | ICD-10-CM | POA: Diagnosis not present

## 2014-12-27 DIAGNOSIS — E119 Type 2 diabetes mellitus without complications: Secondary | ICD-10-CM | POA: Diagnosis not present

## 2014-12-27 DIAGNOSIS — I10 Essential (primary) hypertension: Secondary | ICD-10-CM | POA: Insufficient documentation

## 2014-12-27 NOTE — Progress Notes (Signed)
In [place] OFFICE PROGRESS NOTE  Patient Care Team: Valera Castle, MD as PCP - General   SUMMARY OF ONCOLOGIC HISTORY:  # OCT 2011- CML- CHRONIC PHASE [b3a2-60%; Incidental] NOV 2011- TASIGNA   # DEC 2014-JAN 2015-PROGRESSION [Increase in bcr-abl; ? 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  Since stopping Bosulif approximately 4 weeks ago patient noted to have some improvement in her "quality of life" [fatigue; skin rash etc.]. She is also concerned about weight gain.  However, in the interim patient has read up on imatinib - and now feels that this is an inferior medication- prefers to continue Bosulif.   Patient is chronic cough with sputum. This is not new. No new shortness of breath or chest pain. Denies any skin rash.  REVIEW OF SYSTEMS:  A complete 10 point review of system is done which is negative except mentioned above/history of present illness.   PAST MEDICAL HISTORY :  Past Medical History  Diagnosis Date  . Unspecified essential hypertension   . HLD (hyperlipidemia)   . DM type 2 (diabetes mellitus, type 2) ([place])   . Environmental allergies   . Osteoarthritis   . IDA (iron deficiency anemia)     chronic  . History of gastric ulcer   . Panic disorder   . PSS (progressive systemic sclerosis) ([place])   . COPD (chronic obstructive pulmonary  disease) ([place])   . CML (chronic myelocytic leukemia) ([place])   . Leukemia, chronic myeloid ([place])   . Depression     PAST SURGICAL HISTORY :   Past Surgical History  Procedure Laterality Date  . Back and neck surgery  1999, 2009    had rods placed  . Partial hysterectomy      age 89, no cancer  . Carpal tunnel release    . Cardiac catheterization  [facility]) No blackages found.     FAMILY HISTORY :   Family History  Problem Relation Age of Onset  . Diabetes Mother   . Hypertension Mother   . Heart disease Mother   . Prostate cancer Father   . Heart disease Father   . Diabetes Brother     SOCIAL HISTORY:   Social History  Substance Use Topics  . Smoking status: Never Smoker   . Smokeless tobacco: Never Used     Comment: quit 1985  . Alcohol Use: No    ALLERGIES:  is allergic to codeine; latex; morphine and related; relafen; and tape.  MEDICATIONS:  Current Outpatient Prescriptions  Medication Sig Dispense Refill  . acetaminophen-codeine (TYLENOL #3) 300-30 MG per tablet Take 1 tablet by mouth every 12 (twelve) hours as needed for moderate pain.     Marland Kitchen albuterol (PROVENTIL HFA;VENTOLIN HFA) 108 (90 BASE) MCG/ACT inhaler Inhale 2 puffs into the lungs every 6 (six) hours as needed for wheezing or shortness of breath.    . ALPRAZolam (XANAX) 1 MG tablet Take 1 tablet (1 mg total) by mouth 3 (  three) times daily. 90 tablet 5  . Ascorbic Acid (VITAMIN C) 1000 MG tablet Take 3,000 mg by mouth daily.    . bosutinib (BOSULIF) 100 MG tablet Take 300 mg by mouth daily.     . celecoxib (CELEBREX) 200 MG capsule TAKE 1 CAPSULE (200 MG TOTAL) BY MOUTH TWO (2) TIMES A DAY.    . cetirizine (ZYRTEC) 10 MG tablet Take 20 mg by mouth daily.     . Cholecalciferol (VITAMIN D PO) Take 1 tablet by mouth daily.    Marland Kitchen co-enzyme Q-10 50 MG capsule Take 50 mg by mouth daily.    . cyanocobalamin (,VITAMIN B-12,) 1000 MCG/ML injection Inject 1,000 mcg into the muscle every  30 (thirty) days.    . CYMBALTA 20 MG capsule Take 1 capsule (20 mg total) by mouth 3 (three) times daily. 90 capsule 5  . diphenhydrAMINE (BENADRYL) 25 MG tablet Take 25 mg by mouth every 6 (six) hours as needed for itching or allergies.     . DULoxetine (CYMBALTA) 20 MG capsule Take 1 capsule (20 mg total) by mouth 3 (three) times daily. 90 capsule 5  . estradiol (ESTRACE) 2 MG tablet Take 2 mg by mouth daily.    . fluticasone (FLONASE) 50 MCG/ACT nasal spray Place 1-2 sprays into both nostrils as needed for rhinitis.     . furosemide (LASIX) 40 MG tablet Take 1 tablet (40 mg total) by mouth daily. 30 tablet 3  . insulin aspart (NOVOLOG) 100 UNIT/ML injection Inject 15 Units into the skin 3 (three) times daily as needed for high blood sugar.     . insulin glargine (LANTUS) 100 UNIT/ML injection Inject 0.37 mLs (37 Units total) into the skin daily. 10 mL 11  . losartan (COZAAR) 100 MG tablet Take 100 mg by mouth daily.    Marland Kitchen MAGNESIUM PO Take 4 tablets by mouth daily.     . montelukast (SINGULAIR) 10 MG tablet Take 10 mg by mouth at bedtime.    . Omega-3 Fatty Acids (OMEGA 3 PO) Take 3 capsules by mouth daily.    Marland Kitchen omeprazole (PRILOSEC) 40 MG capsule Take by mouth.    . pregabalin (LYRICA) 150 MG capsule Take 2 capsules (300 mg total) by mouth at bedtime. 60 capsule 5  . spironolactone (ALDACTONE) 25 MG tablet Take 1 tablet (25 mg total) by mouth 2 (two) times daily. 60 tablet 0  . traZODone (DESYREL) 150 MG tablet Take 1 tablet (150 mg total) by mouth at bedtime. 30 tablet 5  . vitamin B-12 (CYANOCOBALAMIN) 1000 MCG tablet Take 4,000 mcg by mouth daily.     No current facility-administered medications for this visit.    PHYSICAL EXAMINATION: ECOG PERFORMANCE STATUS: 1 - Symptomatic but completely ambulatory  BP 138/73 mmHg  Pulse 91  Temp(Src) 97.8 F (36.6 C) (Tympanic)  Resp 18  Ht 5\' 7"  (1.702 m)  Wt 308 lb 6.8 oz (139.9 kg)  BMI 48.29 kg/m2  Filed Weights   12/27/14 1056   Weight: 308 lb 6.8 oz (139.9 kg)    GENERAL: Well-nourished well-developed; Alert, no distress and comfortable.  Morbidly obese. She is alone.  LABORATORY DATA:  I have reviewed the data as listed    Component Value Date/Time   NA 134* 11/03/2014 1125   NA 136 03/02/2014 1411   K 4.0 11/03/2014 1125   K 4.0 03/02/2014 1411   CL 97* 11/03/2014 1125   CL 97* 03/02/2014 1411   CO2 30 11/03/2014 1125  CO2 32 03/02/2014 1411   GLUCOSE 182* 11/03/2014 1125   GLUCOSE 141* 03/02/2014 1411   BUN 21* 11/03/2014 1125   BUN 18 03/02/2014 1411   CREATININE 1.54* 12/12/2014 1013   CREATININE 1.26 03/02/2014 1411   CALCIUM 9.3 11/03/2014 1125   CALCIUM 9.1 03/02/2014 1411   PROT 6.8 12/12/2014 1013   PROT 7.1 03/02/2014 1411   ALBUMIN 3.3* 12/12/2014 1013   ALBUMIN 2.9* 03/02/2014 1411   AST 32 12/12/2014 1013   AST 17 03/02/2014 1411   ALT 24 12/12/2014 1013   ALT 21 03/02/2014 1411   ALKPHOS 46 12/12/2014 1013   ALKPHOS 69 03/02/2014 1411   BILITOT 0.4 12/12/2014 1013   BILITOT 0.4 03/02/2014 1411   GFRNONAA 36* 12/12/2014 1013   GFRNONAA 46* 03/02/2014 1411   GFRNONAA >60 11/11/2013 1451   GFRAA 42* 12/12/2014 1013   GFRAA 56* 03/02/2014 1411   GFRAA >60 11/11/2013 1451    No results found for: SPEP, UPEP  Lab Results  Component Value Date   WBC 5.8 12/12/2014   NEUTROABS 3.9 12/12/2014   HGB 12.2 12/12/2014   HCT 36.5 12/12/2014   MCV 83.5 12/12/2014   PLT 247 12/12/2014      Chemistry      Component Value Date/Time   NA 134* 11/03/2014 1125   NA 136 03/02/2014 1411   K 4.0 11/03/2014 1125   K 4.0 03/02/2014 1411   CL 97* 11/03/2014 1125   CL 97* 03/02/2014 1411   CO2 30 11/03/2014 1125   CO2 32 03/02/2014 1411   BUN 21* 11/03/2014 1125   BUN 18 03/02/2014 1411   CREATININE 1.54* 12/12/2014 1013   CREATININE 1.26 03/02/2014 1411      Component Value Date/Time   CALCIUM 9.3 11/03/2014 1125   CALCIUM 9.1 03/02/2014 1411   ALKPHOS 46 12/12/2014 1013    ALKPHOS 69 03/02/2014 1411   AST 32 12/12/2014 1013   AST 17 03/02/2014 1411   ALT 24 12/12/2014 1013   ALT 21 03/02/2014 1411   BILITOT 0.4 12/12/2014 1013   BILITOT 0.4 03/02/2014 1411       RADIOGRAPHIC STUDIES: I have personally reviewed the radiological images as listed and agreed with the findings in the report. No results found.   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 However patient has been noncompliant with Bosulif given the intolerance [fatigue; weight gain; question skin rash].  BCR ABL tyrosine kinase mutation analysis could not be done because of low quantity of the transcripts. I left a message for Dr.Cai at [place] to discuss the details. I recommend restarting Bosulif at 300 mg day; however patient reluctant- wants to start at 200 mg a day. We'll recheck CBC CMP in 6 weeks; and in 3 months. We will recheck BCR ABL RT-PCR in approximately 3 months/prior to next visit.  # Recommend patient talking to her PCP regarding weight loss.    No orders of the defined types were placed in this encounter.   All questions were answered. The patient knows to call the clinic with any problems, questions or concerns. No barriers to learning was detected.  I spent 25 minutes counseling the patient face to face. The total time spent in the appointment was 30 minutes and more than 50% was on counseling and review of test results  Cammie Sickle, MD 12/27/2014 11:07 AM

## 2014-12-27 NOTE — Progress Notes (Signed)
[facility] MD Progress Note  12/27/2014 4:37 PM Tara Levine  MRN:  [id number] Subjective:  Follow-up for this 59 year old woman with a history of bipolar disorder and PTSD. Also multiple severe medical problems which have caused her a great deal of trouble recently. Her mood fortunately is fairly stable. Nerves are under control. Not having any psychotic symptoms. No suicidal thoughts. No side effects from medicine and no evidence of any abuse of medicine. Principal Problem: @PPROB @ Diagnosis:   Patient Active Problem List   Diagnosis Date Noted  . Bilateral pneumonia [J18.9] 09/07/2014  . OSA (obstructive sleep apnea) [G47.33] 09/07/2014  . Obesity hypoventilation syndrome ([place]) [E66.2] 09/07/2014  . Hyponatremia [E87.1] 09/07/2014  . Acute on chronic renal failure ([place]) [N17.9, N18.9] 09/07/2014  . Morbid obesity ([place]) [E66.01] 09/07/2014  . Hyperkalemia [E87.5] 09/07/2014  . Spinal stenosis, thoracic [M48.04] 09/07/2014  . Chronic back pain [M54.9, G89.29] 09/07/2014  . Diaper candidiasis [L22, B37.2] 09/07/2014  . Constipation [K59.00] 09/07/2014  . 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 Acute respiratory failure with hypoxia ([place]) [J96.01] 08/31/2014  . Chronic diastolic CHF (congestive heart failure) ([place]) [I50.32] 05/18/2014  . Acute bronchitis [J20.9] 04/05/2014  . Severe obesity (BMI >= 40) ([place]) [E66.01] 02/04/2014  . Cough [R05] 01/31/2014  . Snoring [R06.83] 01/31/2014  . SOB (shortness of breath) [R06.02] 01/31/2014  . Post-nasal drip [R09.82] 01/31/2014  . Clinical depression [F32.9] 11/19/2013  . Blood in sputum [R04.2] 10/19/2013  . Cephalalgia [R51] 09/24/2012  . Chronic lymphocytic leukemia ([place]) [C91.10] 09/08/2012  . Diastolic heart failure ([place]) [I50.30] 09/08/2012  . Essential (primary) hypertension [I10] 09/08/2012  . Arthritis, degenerative [M19.90] 09/08/2012  . Diverticulitis [K57.92] 07/31/2012  . Fibromyalgia [M79.7] 07/31/2012  .  PNA (pneumonia) [J18.9] 07/30/2012  . Chronic generalized pain [R52, G89.29] 06/26/2012  . Diabetes mellitus ([place]) [E11.9] 06/26/2012  . Gastric catarrh [K29.70] 06/26/2012  . BP (high blood pressure) [I10] 06/26/2012  . Cellulitis and abscess of trunk [L03.319, L02.219] 03/26/2012  . Cervical pain [M54.2] 02/17/2012  . LBP (low back pain) [M54.5] 02/17/2012  . Hypercholesterolemia [E78.00] 05/27/2011  . Abnormal EKG [R94.31] 04/02/2011  . Chest tightness [R07.89] 04/02/2011  . Chronic pain [G89.29] 04/02/2011  . HTN (hypertension) [I10] 04/02/2011  . Anxiety and depression [F41.8] 04/02/2011  . Back ache [M54.9] 01/15/2011  . Chronic myeloid leukemia ([place]) [C92.10] 01/03/2011   Total Time spent with patient: 20 minutes  Past Psychiatric History: patient has a history of mood instability and disorganized thinking at times in the past with mood lability but has been under pretty good control recently especially with her medical problems.  Past Medical History:  Past Medical History  Diagnosis Date  . Unspecified essential hypertension   . HLD (hyperlipidemia)   . DM type 2 (diabetes mellitus, type 2) ([place])   . Environmental allergies   . Osteoarthritis   . IDA (iron deficiency anemia)     chronic  . History of gastric ulcer   . Panic disorder   . PSS (progressive systemic sclerosis) ([place])   . COPD (chronic obstructive pulmonary disease) ([place])   . CML (chronic myelocytic leukemia) ([place])   . Leukemia, chronic myeloid ([place])   . Depression     Past Surgical History  Procedure Laterality Date  . Back and neck surgery  1999, 2009    had rods placed  . Partial hysterectomy      age 48, no cancer  . Carpal tunnel release    . Cardiac catheterization  1990  [facility]) No blackages found.    Family History:  Family History  Problem Relation Age of Onset  . Diabetes Mother   . Hypertension Mother   . Heart disease Mother   . Prostate cancer Father   .  Heart disease Father   . Diabetes Brother    Family Psychiatric  History: family history of anxiety Social History:  History  Alcohol Use No     History  Drug Use No    Social History   Social History  . Marital Status: Single    Spouse Name: N/A  . Number of Children: N/A  . Years of Education: N/A   Social History Main Topics  . Smoking status: Never Smoker   . Smokeless tobacco: Never Used     Comment: quit 1985  . Alcohol Use: No  . Drug Use: No  . Sexual Activity: Not Currently   Other Topics Concern  . None   Social History Narrative   Additional Social History:                         Sleep: Fair  Appetite:  Fair  Current Medications: Current Outpatient Prescriptions  Medication Sig Dispense Refill  . acetaminophen-codeine (TYLENOL #3) 300-30 MG per tablet Take 1 tablet by mouth every 12 (twelve) hours as needed for moderate pain.     Marland Kitchen albuterol (PROVENTIL HFA;VENTOLIN HFA) 108 (90 BASE) MCG/ACT inhaler Inhale 2 puffs into the lungs every 6 (six) hours as needed for wheezing or shortness of breath.    . ALPRAZolam (XANAX) 1 MG tablet Take 1 tablet (1 mg total) by mouth 3 (three) times daily. 90 tablet 5  . Ascorbic Acid (VITAMIN C) 1000 MG tablet Take 3,000 mg by mouth daily.    . bosutinib (BOSULIF) 100 MG tablet Take 300 mg by mouth daily.     . celecoxib (CELEBREX) 200 MG capsule TAKE 1 CAPSULE (200 MG TOTAL) BY MOUTH TWO (2) TIMES A DAY.    . cetirizine (ZYRTEC) 10 MG tablet Take 20 mg by mouth daily.     . Cholecalciferol (VITAMIN D PO) Take 1 tablet by mouth daily.    Marland Kitchen co-enzyme Q-10 50 MG capsule Take 50 mg by mouth daily.    . cyanocobalamin (,VITAMIN B-12,) 1000 MCG/ML injection Inject 1,000 mcg into the muscle every 30 (thirty) days.    . CYMBALTA 20 MG capsule Take 1 capsule (20 mg total) by mouth 3 (three) times daily. 90 capsule 5  . diphenhydrAMINE (BENADRYL) 25 MG tablet Take 25 mg by mouth every 6 (six) hours as needed for  itching or allergies.     Marland Kitchen estradiol (ESTRACE) 2 MG tablet Take 2 mg by mouth daily.    . fluticasone (FLONASE) 50 MCG/ACT nasal spray Place 1-2 sprays into both nostrils as needed for rhinitis.     . furosemide (LASIX) 40 MG tablet Take 1 tablet (40 mg total) by mouth daily. 30 tablet 3  . insulin aspart (NOVOLOG) 100 UNIT/ML injection Inject 15 Units into the skin 3 (three) times daily as needed for high blood sugar.     . insulin glargine (LANTUS) 100 UNIT/ML injection Inject 0.37 mLs (37 Units total) into the skin daily. 10 mL 11  . losartan (COZAAR) 100 MG tablet Take 100 mg by mouth daily.    Marland Kitchen MAGNESIUM PO Take 4 tablets by mouth daily.     . montelukast (SINGULAIR) 10 MG tablet Take  10 mg by mouth at bedtime.    . Omega-3 Fatty Acids (OMEGA 3 PO) Take 3 capsules by mouth daily.    Marland Kitchen omeprazole (PRILOSEC) 40 MG capsule Take by mouth.    . pregabalin (LYRICA) 150 MG capsule Take 2 capsules (300 mg total) by mouth at bedtime. 60 capsule 5  . spironolactone (ALDACTONE) 25 MG tablet Take 1 tablet (25 mg total) by mouth 2 (two) times daily. 60 tablet 0  . traZODone (DESYREL) 150 MG tablet Take 1 tablet (150 mg total) by mouth at bedtime. 30 tablet 5  . vitamin B-12 (CYANOCOBALAMIN) 1000 MCG tablet Take 4,000 mcg by mouth daily.    . DULoxetine (CYMBALTA) 20 MG capsule Take 1 capsule (20 mg total) by mouth 3 (three) times daily. 90 capsule 5   No current facility-administered medications for this visit.    Lab Results: No results found for this or any previous visit (from the past 48 hour(s)).  Physical Findings: AIMS:  , ,  ,  ,    CIWA:    COWS:     Musculoskeletal: Strength & Muscle Tone: within normal limits Gait & Station: normal Patient leans: N/A  Psychiatric Specialty Exam: ROS  Blood pressure 132/88, pulse 104, temperature 97.9 F (36.6 C), temperature source Tympanic, height 5\' 7"  (1.702 m), weight 307 lb 12.8 oz (139.617 kg), SpO2 95 %.Body mass index is 48.2 kg/(m^2).   General Appearance: Casual  Eye Contact::  Good  Speech:  Clear and Coherent  Volume:  Normal  Mood:  Euthymic  Affect:  Full Range  Thought Process:  Goal Directed  Orientation:  Full (Time, Place, and Person)  Thought Content:  Negative  Suicidal Thoughts:  No  Homicidal Thoughts:  No  Memory:  Immediate;   Good Recent;   Good Remote;   Good  Judgement:  Good  Insight:  Good  Psychomotor Activity:  Normal  Concentration:  Good  Recall:  Good  Fund of Knowledge:Good  Language: Good  Akathisia:  No  Handed:  Right  AIMS (if indicated):     Assets:  Communication Skills Desire for Improvement Financial Resources/Insurance Housing Intimacy Social Support  ADL's:  Intact  Cognition: WNL  Sleep:      Treatment Plan Summary: Medication management and Plan supportive counseling. Review of medication. Patient is currently stable on Xanax. Does not appear to be abusing it or having excess sedation or confusion. We will leave the dose as it has been. No change to antidepressant medication. Cymbalta 20 mg 3 times a day. Supportive counseling. Follow-up in another 6 months.  John Clapacs 12/27/2014, 4:37 PM

## 2015-01-12 ENCOUNTER — Inpatient Hospital Stay: Payer: Medicaid Other

## 2015-01-23 ENCOUNTER — Other Ambulatory Visit: Payer: Self-pay | Admitting: *Deleted

## 2015-01-23 MED ORDER — FUROSEMIDE 40 MG PO TABS: 40.0000 mg | ORAL_TABLET | Freq: Every day | ORAL | Status: AC

## 2015-02-02 ENCOUNTER — Other Ambulatory Visit: Payer: Medicaid Other

## 2015-02-02 ENCOUNTER — Ambulatory Visit: Payer: Medicaid Other

## 2015-02-07 ENCOUNTER — Other Ambulatory Visit: Payer: Medicaid Other

## 2015-02-10 ENCOUNTER — Other Ambulatory Visit: Payer: Medicaid Other

## 2015-03-14 ENCOUNTER — Telehealth: Payer: Self-pay | Admitting: *Deleted

## 2015-03-14 ENCOUNTER — Inpatient Hospital Stay: Payer: Medicaid Other | Attending: Internal Medicine

## 2015-03-14 DIAGNOSIS — J449 Chronic obstructive pulmonary disease, unspecified: Secondary | ICD-10-CM | POA: Insufficient documentation

## 2015-03-14 DIAGNOSIS — E119 Type 2 diabetes mellitus without complications: Secondary | ICD-10-CM | POA: Insufficient documentation

## 2015-03-14 DIAGNOSIS — Z79899 Other long term (current) drug therapy: Secondary | ICD-10-CM | POA: Insufficient documentation

## 2015-03-14 DIAGNOSIS — F329 Major depressive disorder, single episode, unspecified: Secondary | ICD-10-CM | POA: Diagnosis not present

## 2015-03-14 DIAGNOSIS — I10 Essential (primary) hypertension: Secondary | ICD-10-CM | POA: Diagnosis not present

## 2015-03-14 DIAGNOSIS — C9210 Chronic myeloid leukemia, BCR/ABL-positive, not having achieved remission: Secondary | ICD-10-CM | POA: Insufficient documentation

## 2015-03-14 DIAGNOSIS — Z794 Long term (current) use of insulin: Secondary | ICD-10-CM | POA: Insufficient documentation

## 2015-03-14 DIAGNOSIS — E785 Hyperlipidemia, unspecified: Secondary | ICD-10-CM | POA: Insufficient documentation

## 2015-03-14 LAB — CBC WITH DIFFERENTIAL/PLATELET
BASOS PCT: 0 %
Basophils Absolute: 0 10*3/uL (ref 0–0.1)
EOS PCT: 1 %
Eosinophils Absolute: 0.1 10*3/uL (ref 0–0.7)
HEMATOCRIT: 36.6 % (ref 35.0–47.0)
Hemoglobin: 12.1 g/dL (ref 12.0–16.0)
Lymphocytes Relative: 15 %
Lymphs Abs: 1.4 10*3/uL (ref 1.0–3.6)
MCH: 27.7 pg (ref 26.0–34.0)
MCHC: 33.2 g/dL (ref 32.0–36.0)
MCV: 83.4 fL (ref 80.0–100.0)
MONO ABS: 0.6 10*3/uL (ref 0.2–0.9)
MONOS PCT: 6 %
NEUTROS ABS: 7.4 10*3/uL — AB (ref 1.4–6.5)
Neutrophils Relative %: 78 %
Platelets: 264 10*3/uL (ref 150–440)
RBC: 4.39 MIL/uL (ref 3.80–5.20)
RDW: 15.4 % — AB (ref 11.5–14.5)
WBC: 9.5 10*3/uL (ref 3.6–11.0)

## 2015-03-14 LAB — COMPREHENSIVE METABOLIC PANEL
ALBUMIN: 3.4 g/dL — AB (ref 3.5–5.0)
ALK PHOS: 63 U/L (ref 38–126)
ALT: 26 U/L (ref 14–54)
ANION GAP: 11 (ref 5–15)
AST: 26 U/L (ref 15–41)
BUN: 26 mg/dL — AB (ref 6–20)
CALCIUM: 9.2 mg/dL (ref 8.9–10.3)
CO2: 28 mmol/L (ref 22–32)
CREATININE: 1.04 mg/dL — AB (ref 0.44–1.00)
Chloride: 95 mmol/L — ABNORMAL LOW (ref 101–111)
GFR calc Af Amer: >60 mL/min (ref >60)
GFR calc non Af Amer: 57 mL/min — ABNORMAL LOW (ref >60)
GLUCOSE: 215 mg/dL — AB (ref 65–99)
Potassium: 3.5 mmol/L (ref 3.5–5.1)
SODIUM: 134 mmol/L — AB (ref 135–145)
Total Bilirubin: 0.6 mg/dL (ref 0.3–1.2)
Total Protein: 7.6 g/dL (ref 6.5–8.1)

## 2015-03-14 NOTE — Telephone Encounter (Signed)
Pt called [facility] to speak to a nurse. Pt stated, "I've missed several appointments. I've been really sick with this upper respiratory infection. I need you to tell me if I can come in today for my labs and see the doctor next week for the results. I can come at 130pm today." I told the patient that she has missed several appointments and had rescheduled these appointments multiple times. The patient replied, "It's your responsibility to see that I keep these appointments. Judeen Hammans always called me to made sure I kept them. 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 I reminded the patient that she must take on a level of personal responsibility to ensure that these appointments are kept. I explained that since she is still on Bosulf an oral biologic therapy for her CML. We must monitor her labs for this reason to make sure the patient is safe while receiving this drug.  An apt was given for today at 130pm for a lab check and she will be scheduled to see md next Tuesday for the lab results.

## 2015-03-16 ENCOUNTER — Ambulatory Visit: Payer: Self-pay | Admitting: Psychiatry

## 2015-03-20 LAB — BCR-ABL1, CML/ALL, PCR, QUANT: B3A2 TRANSCRIPT: 2.44 %

## 2015-03-21 ENCOUNTER — Ambulatory Visit: Payer: Medicaid Other | Admitting: Internal Medicine

## 2015-03-21 ENCOUNTER — Encounter: Payer: Self-pay | Admitting: *Deleted

## 2015-03-21 NOTE — Progress Notes (Signed)
Patient no showed with apt with Dr. Rogue Bussing. Msg sent to cancer center scheduling to have this apt be r/s for next week.

## 2015-03-28 ENCOUNTER — Inpatient Hospital Stay (HOSPITAL_BASED_OUTPATIENT_CLINIC_OR_DEPARTMENT_OTHER): Payer: Medicaid Other | Admitting: Internal Medicine

## 2015-03-28 ENCOUNTER — Encounter: Payer: Self-pay | Admitting: Internal Medicine

## 2015-03-28 VITALS — BP 126/77 | HR 81 | Temp 98.0°F | Resp 18 | Ht 67.0 in | Wt 309.0 lb

## 2015-03-28 DIAGNOSIS — Z79899 Other long term (current) drug therapy: Secondary | ICD-10-CM | POA: Diagnosis not present

## 2015-03-28 DIAGNOSIS — C9210 Chronic myeloid leukemia, BCR/ABL-positive, not having achieved remission: Secondary | ICD-10-CM

## 2015-03-28 NOTE — Progress Notes (Signed)
Pt crying and unhappy at the end of visit today. She asked receptionist if she could to different change providers. Patient specifically requested to see Dr. Grayland Ormond. Dr. Rogue Bussing made aware. He will speak to Dr. Grayland Ormond and his team regarding the patient's care before next appointment is changed. Pt made aware.

## 2015-03-28 NOTE — Progress Notes (Signed)
[facility] OFFICE PROGRESS NOTE  Patient Care Team: Valera Castle, MD as PCP - General   SUMMARY OF ONCOLOGIC HISTORY:  # OCT 2011- CML- CHRONIC PHASE [b3a2-60%; Incidental] NOV 2011- TASIGNA  ["fluid over load"]  # DEC 2014-JAN 2015-PROGRESSION [Increase in bcr-abl; ? Non-compliance/SE-hypomag]; START BOSULIF [no Mutation analysis done] [455m]; May 2015- bcr-abl-Undetectable; 20110m[AE-LFTs] SEP 2015- bcr-abl- Increase 0.052%; 10022mnon-compliance] JAN 2016 bcr-abl Increased 5.456%; 300m50mune 2016- bcr-abl <0.001%; STOPPED Bosulif end SEP 2016 [non-compliance];Sep 2016- B3 A2- 0.026% [increasing]; OCT 2016- MUTATION ANALYSIS- not done sec to lack of enough transcripts;   # NOV 1st Re-START BOLSULIF at 300mg86mJAN 2017- 2.44 [RISING]  INTERVAL HISTORY:  60 -y30r-old female patient with above history of chronic phase CML most recently on Bosulif [stopped in September 2016/noncompliance] is here to discuss the results of the recent testing.   patient is currently on Bosulif 300 mg once a day.  She has multiple complaints-  Most predominantly fatigue arthritic pain.  Otherwise no  Unusual weight loss or night sweats or fevers. No new shortness of breath or chest pain. Denies any skin rash. Denies any diarrhea.  REVIEW OF SYSTEMS:  A complete 10 point review of system is done which is negative except mentioned above/history of present illness.   PAST MEDICAL HISTORY :  Past Medical History  Diagnosis Date  . Unspecified essential hypertension   . HLD (hyperlipidemia)   . DM type 2 (diabetes mellitus, type 2) (HCC) [place] Environmental allergies   . Osteoarthritis   . IDA (iron deficiency anemia)     chronic  . History of gastric ulcer   . Panic disorder   . PSS (progressive systemic sclerosis) (HCC) [place] COPD (chronic obstructive pulmonary disease) (HCC) [place] CML (chronic myelocytic leukemia) (HCC) [place] Leukemia, chronic myeloid (HCC) [place] Depression      PAST SURGICAL HISTORY :   Past Surgical History  Procedure Laterality Date  . Back and neck surgery  1999, 2009    had rods placed  . Partial hysterectomy      age 81, n25cancer  . Carpal tunnel release    . [facility]     FAMILY HISTORY :   Family History  Problem Relation Age of Onset  . Diabetes Mother   . Hypertension Mother   . Heart disease Mother   . Prostate cancer Father   . Heart disease Father   . Diabetes Brother     SOCIAL HISTORY:   Social History  Substance Use Topics  . Smoking status: Never Smoker   . Smokeless tobacco: Never Used     Comment: quit 1985  . Alcohol Use: No    ALLERGIES:  is allergic to codeine; latex; morphine and related; relafen; and tape.  MEDICATIONS:  Current Outpatient Prescriptions  Medication Sig Dispense Refill  . acetaminophen-codeine (TYLENOL #3) 300-30 MG per tablet Take 1 tablet by mouth every 12 (twelve) hours as needed for moderate pain.     . albMarland Kitchenterol (PROVENTIL HFA;VENTOLIN HFA) 108 (90 BASE) MCG/ACT inhaler Inhale 2 puffs into the lungs every 6 (six) hours as needed for wheezing or shortness of breath.    . ALPRAZolam (XANAX) 1 MG tablet Take 1 tablet (1 mg total) by mouth 3 (three) times daily. 90 tablet 5  . Ascorbic Acid (VITAMIN C) 1000 MG tablet Take 3,000 mg  by mouth daily.    . bosutinib (BOSULIF) 100 MG tablet Take 300 mg by mouth daily.     . celecoxib (CELEBREX) 200 MG capsule TAKE 1 CAPSULE (200 MG TOTAL) BY MOUTH TWO (2) TIMES A DAY.    . cetirizine (ZYRTEC) 10 MG tablet Take 20 mg by mouth daily.     . Cholecalciferol (VITAMIN D PO) Take 1 tablet by mouth daily.    . co-enzyme Q-10 50 MG capsule Take 50 mg by mouth daily.    . cyanocobalamin (,VITAMIN B-12,) 1000 MCG/ML injection Inject 1,000 mcg into the muscle every 30 (thirty) days.    . CYMBALTA 20 MG capsule Take 1 capsule (20 mg total) by mouth 3 (three) times daily. 90  capsule 5  . diphenhydrAMINE (BENADRYL) 25 MG tablet Take 25 mg by mouth every 6 (six) hours as needed for itching or allergies.     . DULoxetine (CYMBALTA) 20 MG capsule Take 1 capsule (20 mg total) by mouth 3 (three) times daily. 90 capsule 5  . estradiol (ESTRACE) 2 MG tablet Take 2 mg by mouth daily.    . fluticasone (FLONASE) 50 MCG/ACT nasal spray Place 1-2 sprays into both nostrils as needed for rhinitis.     . furosemide (LASIX) 40 MG tablet Take 1 tablet (40 mg total) by mouth daily. 30 tablet 3  . insulin aspart (NOVOLOG) 100 UNIT/ML injection Inject 15 Units into the skin 3 (three) times daily as needed for high blood sugar.     . insulin glargine (LANTUS) 100 UNIT/ML injection Inject 0.37 mLs (37 Units total) into the skin daily. 10 mL 11  . losartan (COZAAR) 100 MG tablet Take 100 mg by mouth daily.    . MAGNESIUM PO Take 4 tablets by mouth daily.     . montelukast (SINGULAIR) 10 MG tablet Take 10 mg by mouth at bedtime.    . Omega-3 Fatty Acids (OMEGA 3 PO) Take 3 capsules by mouth daily.    . omeprazole (PRILOSEC) 40 MG capsule Take by mouth.    . pregabalin (LYRICA) 150 MG capsule Take 2 capsules (300 mg total) by mouth at bedtime. 60 capsule 5  . spironolactone (ALDACTONE) 25 MG tablet Take 1 tablet (25 mg total) by mouth 2 (two) times daily. 60 tablet 0  . traZODone (DESYREL) 150 MG tablet Take 1 tablet (150 mg total) by mouth at bedtime. 30 tablet 5  . vitamin B-12 (CYANOCOBALAMIN) 1000 MCG tablet Take 4,000 mcg by mouth daily.     No current facility-administered medications for this visit.    PHYSICAL EXAMINATION: ECOG PERFORMANCE STATUS: 1 - Symptomatic but completely ambulatory  GENERAL: Well-nourished well-developed; Alert, no distress and comfortable. Morbidly obese. She is alone. EYES: no pallor or icterus OROPHARYNX: no thrush or ulceration; good dentition  NECK: supple, no masses felt LYMPH: no palpable lymphadenopathy in the cervical, axillary or inguinal  regions LUNGS: clear to auscultation and No wheeze or crackles HEART/CVS: regular rate & rhythm and no murmurs; No lower extremity edema ABDOMEN:abdomen soft, non-tender and normal bowel sounds Musculoskeletal:no cyanosis of digits and no clubbing  PSYCH: alert & oriented x 3 with fluent speech NEURO: no focal motor/sensory deficits SKIN: no rashes or significant lesions  BP 126/77 mmHg  Pulse 81  Temp(Src) 98 F (36.7 C) (Tympanic)  Resp 18  Ht 5' 7" (1.702 m)  Wt 309 lb (140.161 kg)  BMI 48.38 kg/m2  Filed Weights   03/28/15 1137  Weight: 309 lb (140.161 kg)      GENERAL: Well-nourished well-developed; Alert, no distress and comfortable.  Morbidly obese. She is alone.  LABORATORY DATA:  I have reviewed the data as listed    Component Value Date/Time   NA 134* 03/14/2015 1337   NA 136 03/02/2014 1411   K 3.5 03/14/2015 1337   K 4.0 03/02/2014 1411   CL 95* 03/14/2015 1337   CL 97* 03/02/2014 1411   CO2 28 03/14/2015 1337   CO2 32 03/02/2014 1411   GLUCOSE 215* 03/14/2015 1337   GLUCOSE 141* 03/02/2014 1411   BUN 26* 03/14/2015 1337   BUN 18 03/02/2014 1411   CREATININE 1.04* 03/14/2015 1337   CREATININE 1.26 03/02/2014 1411   CALCIUM 9.2 03/14/2015 1337   CALCIUM 9.1 03/02/2014 1411   PROT 7.6 03/14/2015 1337   PROT 7.1 03/02/2014 1411   ALBUMIN 3.4* 03/14/2015 1337   ALBUMIN 2.9* 03/02/2014 1411   AST 26 03/14/2015 1337   AST 17 03/02/2014 1411   ALT 26 03/14/2015 1337   ALT 21 03/02/2014 1411   ALKPHOS 63 03/14/2015 1337   ALKPHOS 69 03/02/2014 1411   BILITOT 0.6 03/14/2015 1337   BILITOT 0.4 03/02/2014 1411   GFRNONAA 57* 03/14/2015 1337   GFRNONAA 46* 03/02/2014 1411   GFRNONAA >60 11/11/2013 1451   GFRAA >60 03/14/2015 1337   GFRAA 56* 03/02/2014 1411   GFRAA >60 11/11/2013 1451    No results found for: SPEP, UPEP  Lab Results  Component Value Date   WBC 9.5 03/14/2015   NEUTROABS 7.4* 03/14/2015   HGB 12.1 03/14/2015   HCT 36.6  03/14/2015   MCV 83.4 03/14/2015   PLT 264 03/14/2015      Chemistry      Component Value Date/Time   NA 134* 03/14/2015 1337   NA 136 03/02/2014 1411   K 3.5 03/14/2015 1337   K 4.0 03/02/2014 1411   CL 95* 03/14/2015 1337   CL 97* 03/02/2014 1411   CO2 28 03/14/2015 1337   CO2 32 03/02/2014 1411   BUN 26* 03/14/2015 1337   BUN 18 03/02/2014 1411   CREATININE 1.04* 03/14/2015 1337   CREATININE 1.26 03/02/2014 1411      Component Value Date/Time   CALCIUM 9.2 03/14/2015 1337   CALCIUM 9.1 03/02/2014 1411   ALKPHOS 63 03/14/2015 1337   ALKPHOS 69 03/02/2014 1411   AST 26 03/14/2015 1337   AST 17 03/02/2014 1411   ALT 26 03/14/2015 1337   ALT 21 03/02/2014 1411   BILITOT 0.6 03/14/2015 1337   BILITOT 0.4 03/02/2014 1411        No results found.   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  Patient has been on Bosulif 300 mg for the last 3 months-  BCR ABL transcripts rising JAN 2017- 2.44 [oct 2016- 0.26].   I explained my concerns to the patient that  If she has been on Bosulif 300 mg;  And the BCR ABL is increasing-  I would recommend going up on Bosulif  To either 400 mg or 500 mg once a day.  Patient declined/  Stated  She is concerned about liver enzyme abnormalities/ and kidney  Problems.  I offered  To slowly go up on the Bosulif to 400 mg-  Which she again declined.   Given the difficulty  In managing her  CML On the tyrosine kinase inhibitors-  I offered her to seek second opinion at a tertiary care Institute;  Which she declined.  At the end of the discussion it was decided that  For now she'll continue 300 mg of Bosulif and We'll repeat her labs CBC CMP in 6 weeks;  With the plan to repeat BCR ABL transcripts in 3 months.  #  However patient at the [place] that  her needs were not met/ concerns not addressed completely.  And wanted to seek  A different doctor in the group. I will check into it.   I spent 25 minutes counseling the patient face to face. The total time spent in the appointment was 30 minutes and more than 50% was on counseling and review of test results     Cammie Sickle, MD 03/28/2015 11:49 AM

## 2015-04-04 ENCOUNTER — Other Ambulatory Visit: Payer: Medicaid Other

## 2015-04-07 ENCOUNTER — Other Ambulatory Visit: Payer: Medicaid Other

## 2015-04-11 ENCOUNTER — Ambulatory Visit: Payer: Medicaid Other | Admitting: Internal Medicine

## 2015-04-12 ENCOUNTER — Inpatient Hospital Stay: Payer: Medicaid Other | Attending: Internal Medicine

## 2015-04-12 DIAGNOSIS — Z79899 Other long term (current) drug therapy: Secondary | ICD-10-CM | POA: Insufficient documentation

## 2015-04-12 DIAGNOSIS — E119 Type 2 diabetes mellitus without complications: Secondary | ICD-10-CM | POA: Insufficient documentation

## 2015-04-12 DIAGNOSIS — C9210 Chronic myeloid leukemia, BCR/ABL-positive, not having achieved remission: Secondary | ICD-10-CM | POA: Insufficient documentation

## 2015-04-12 DIAGNOSIS — I10 Essential (primary) hypertension: Secondary | ICD-10-CM | POA: Insufficient documentation

## 2015-04-12 DIAGNOSIS — Z794 Long term (current) use of insulin: Secondary | ICD-10-CM | POA: Insufficient documentation

## 2015-04-12 DIAGNOSIS — F329 Major depressive disorder, single episode, unspecified: Secondary | ICD-10-CM | POA: Insufficient documentation

## 2015-04-12 DIAGNOSIS — J449 Chronic obstructive pulmonary disease, unspecified: Secondary | ICD-10-CM | POA: Insufficient documentation

## 2015-04-12 DIAGNOSIS — E785 Hyperlipidemia, unspecified: Secondary | ICD-10-CM | POA: Insufficient documentation

## 2015-04-14 ENCOUNTER — Inpatient Hospital Stay: Payer: Medicaid Other | Admitting: Oncology

## 2015-04-18 ENCOUNTER — Other Ambulatory Visit: Payer: Medicaid Other

## 2015-04-19 ENCOUNTER — Inpatient Hospital Stay: Payer: Medicaid Other

## 2015-04-19 DIAGNOSIS — E785 Hyperlipidemia, unspecified: Secondary | ICD-10-CM | POA: Diagnosis not present

## 2015-04-19 DIAGNOSIS — C9210 Chronic myeloid leukemia, BCR/ABL-positive, not having achieved remission: Secondary | ICD-10-CM | POA: Diagnosis present

## 2015-04-19 DIAGNOSIS — I10 Essential (primary) hypertension: Secondary | ICD-10-CM | POA: Diagnosis not present

## 2015-04-19 DIAGNOSIS — Z794 Long term (current) use of insulin: Secondary | ICD-10-CM | POA: Diagnosis not present

## 2015-04-19 DIAGNOSIS — F329 Major depressive disorder, single episode, unspecified: Secondary | ICD-10-CM | POA: Diagnosis not present

## 2015-04-19 DIAGNOSIS — Z79899 Other long term (current) drug therapy: Secondary | ICD-10-CM | POA: Diagnosis not present

## 2015-04-19 DIAGNOSIS — J449 Chronic obstructive pulmonary disease, unspecified: Secondary | ICD-10-CM | POA: Diagnosis not present

## 2015-04-19 DIAGNOSIS — E119 Type 2 diabetes mellitus without complications: Secondary | ICD-10-CM | POA: Diagnosis not present

## 2015-04-19 LAB — CBC WITH DIFFERENTIAL/PLATELET
BASOS PCT: 0 %
Basophils Absolute: 0 10*3/uL (ref 0–0.1)
EOS ABS: 0 10*3/uL (ref 0–0.7)
Eosinophils Relative: 1 %
HCT: 35.5 % (ref 35.0–47.0)
HEMOGLOBIN: 11.7 g/dL — AB (ref 12.0–16.0)
Lymphocytes Relative: 19 %
Lymphs Abs: 1.1 10*3/uL (ref 1.0–3.6)
MCH: 27.9 pg (ref 26.0–34.0)
MCHC: 32.9 g/dL (ref 32.0–36.0)
MCV: 84.8 fL (ref 80.0–100.0)
MONOS PCT: 6 %
Monocytes Absolute: 0.4 10*3/uL (ref 0.2–0.9)
NEUTROS PCT: 74 %
Neutro Abs: 4.4 10*3/uL (ref 1.4–6.5)
Platelets: 238 10*3/uL (ref 150–440)
RBC: 4.19 MIL/uL (ref 3.80–5.20)
RDW: 15.2 % — AB (ref 11.5–14.5)
WBC: 6 10*3/uL (ref 3.6–11.0)

## 2015-04-19 LAB — COMPREHENSIVE METABOLIC PANEL
ALBUMIN: 3.3 g/dL — AB (ref 3.5–5.0)
ALK PHOS: 59 U/L (ref 38–126)
ALT: 50 U/L (ref 14–54)
ANION GAP: 7 (ref 5–15)
AST: 38 U/L (ref 15–41)
BILIRUBIN TOTAL: 0.3 mg/dL (ref 0.3–1.2)
BUN: 21 mg/dL — ABNORMAL HIGH (ref 6–20)
CALCIUM: 8.7 mg/dL — AB (ref 8.9–10.3)
CO2: 26 mmol/L (ref 22–32)
Chloride: 99 mmol/L — ABNORMAL LOW (ref 101–111)
Creatinine, Ser: 1.06 mg/dL — ABNORMAL HIGH (ref 0.44–1.00)
GFR calc non Af Amer: 56 mL/min — ABNORMAL LOW (ref >60)
Glucose, Bld: 187 mg/dL — ABNORMAL HIGH (ref 65–99)
POTASSIUM: 4.1 mmol/L (ref 3.5–5.1)
Sodium: 132 mmol/L — ABNORMAL LOW (ref 135–145)
TOTAL PROTEIN: 7.2 g/dL (ref 6.5–8.1)

## 2015-04-21 ENCOUNTER — Inpatient Hospital Stay (HOSPITAL_BASED_OUTPATIENT_CLINIC_OR_DEPARTMENT_OTHER): Payer: Medicaid Other | Admitting: Oncology

## 2015-04-21 VITALS — BP 145/77 | HR 77 | Temp 97.3°F | Wt 310.2 lb

## 2015-04-21 DIAGNOSIS — Z79899 Other long term (current) drug therapy: Secondary | ICD-10-CM

## 2015-04-21 DIAGNOSIS — C9210 Chronic myeloid leukemia, BCR/ABL-positive, not having achieved remission: Secondary | ICD-10-CM

## 2015-04-21 MED ORDER — BOSUTINIB 100 MG PO TABS: 400.0000 mg | ORAL_TABLET | Freq: Every day | ORAL | Status: DC

## 2015-04-21 NOTE — Progress Notes (Signed)
Patient here today for follow up regarding CML. Patient denies any concerns today.

## 2015-05-02 ENCOUNTER — Other Ambulatory Visit: Payer: Medicaid Other

## 2015-05-02 ENCOUNTER — Telehealth: Payer: Self-pay | Admitting: *Deleted

## 2015-05-02 NOTE — Telephone Encounter (Signed)
[employer] left message stating they have made multiple attempts to reach patient to set up shipping/delivery for Bosulif. [employer] has placed Bosulif on hold until they can reach patient.

## 2015-05-02 NOTE — Telephone Encounter (Signed)
Ok, thank you for the update.

## 2015-05-03 NOTE — Progress Notes (Signed)
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 Non-compliance/SE-hypomag]; START BOSULIF [no Mutation analysis done] [400mg ]; May 2015- bcr-abl-Undetectable; 200mg  [AE-LFTs] SEP 2015- bcr-abl- Increase 0.052%; 100mg  [non-compliance] JAN 2016 bcr-abl Increased 5.456%; 300mg ; June 2016- bcr-abl <0.001%; STOPPED Bosulif end SEP 2016 [non-compliance];Sep 2016- B3 A2- 0.026% [increasing]; OCT 2016- MUTATION ANALYSIS- not done sec to lack of enough transcripts;   # NOV 1st Re-START BOLSULIF at 300mg /d; JAN 2017- 2.44 [RISING]  INTERVAL HISTORY:  Patient returns to clinic today for further evaluation and laboratory work. She currently is taking Bosulif 300 mg once a day, but has a long history of noncompliance. She has multiple medical complaints all of which are chronic and unchanged. She is no neurologic complaints. She denies any fevers, chills, night sweats, or weight loss. She has no chest pain or shortness of breath. She denies any nausea, vomiting, constipation, or diarrhea. She has no urinary complaints. Patient feels at her baseline and offers no specific complaints today.   REVIEW OF SYSTEMS:  A complete 10 point review of system is done which is negative except mentioned above/history of present illness.   PAST MEDICAL HISTORY :  Past Medical History  Diagnosis Date  . Unspecified essential hypertension   . HLD (hyperlipidemia)   . DM type 2 (diabetes mellitus, type 2) ([place])   . Environmental allergies   . Osteoarthritis   . IDA (iron deficiency anemia)     chronic  . History of gastric ulcer   . Panic disorder   . PSS (progressive systemic sclerosis) ([place])   . COPD (chronic obstructive pulmonary disease) ([place])   . CML (chronic  myelocytic leukemia) ([place])   . Leukemia, chronic myeloid ([place])   . Depression     PAST SURGICAL HISTORY :   Past Surgical History  Procedure Laterality Date  . Back and neck surgery  1999, 2009    had rods placed  . Partial hysterectomy      age 58, no cancer  . Carpal tunnel release    . Cardiac catheterization  [facility]) No blackages found.     FAMILY HISTORY :   Family History  Problem Relation Age of Onset  . Diabetes Mother   . Hypertension Mother   . Heart disease Mother   . Prostate cancer Father   . Heart disease Father   . Diabetes Brother     SOCIAL HISTORY:   Social History  Substance Use Topics  . Smoking status: Never Smoker   . Smokeless tobacco: Never Used     Comment: quit 1985  . Alcohol Use: No    ALLERGIES:  is allergic to codeine; latex; morphine and related; relafen; and tape.  MEDICATIONS:  Current Outpatient Prescriptions  Medication Sig Dispense Refill  . acetaminophen-codeine (TYLENOL #3) 300-30 MG per tablet Take 1 tablet by mouth every 12 (twelve) hours as needed for moderate pain.     Marland Kitchen albuterol (PROVENTIL HFA;VENTOLIN HFA) 108 (90 BASE) MCG/ACT inhaler Inhale 2 puffs into the lungs every 6 (six) hours as needed for wheezing or shortness of breath.    Marland Kitchen albuterol (PROVENTIL) (2.5 MG/3ML) 0.083% nebulizer solution INHALE CONTENTS OF 1 VIAL VIA NEBULIZER THREE TIMES A DAY prn    .  ALPRAZolam (XANAX) 1 MG tablet Take 1 tablet (1 mg total) by mouth 3 (three) times daily. 90 tablet 5  . Ascorbic Acid (VITAMIN C) 1000 MG tablet Take 3,000 mg by mouth daily.    . bosutinib (BOSULIF) 100 MG tablet Take 300 mg by mouth daily.     . celecoxib (CELEBREX) 200 MG capsule TAKE 1 CAPSULE (200 MG TOTAL) BY MOUTH TWO (2) TIMES A DAY.    . cetirizine (ZYRTEC) 10 MG tablet Take 20 mg by mouth daily.     . Cholecalciferol (VITAMIN D PO) Take 1 tablet by mouth daily.    Marland Kitchen co-enzyme Q-10 50 MG capsule Take 50 mg by mouth daily.     . cyanocobalamin (,VITAMIN B-12,) 1000 MCG/ML injection Inject 1,000 mcg into the muscle every 30 (thirty) days.    . CYMBALTA 20 MG capsule Take 1 capsule (20 mg total) by mouth 3 (three) times daily. 90 capsule 5  . diphenhydrAMINE (BENADRYL) 25 MG tablet Take 25 mg by mouth every 6 (six) hours as needed for itching or allergies.     . DULoxetine (CYMBALTA) 20 MG capsule Take 1 capsule (20 mg total) by mouth 3 (three) times daily. 90 capsule 5  . estradiol (ESTRACE) 2 MG tablet Take 2 mg by mouth daily.    . fluticasone (FLONASE) 50 MCG/ACT nasal spray Place 1-2 sprays into both nostrils as needed for rhinitis.     . furosemide (LASIX) 40 MG tablet Take 1 tablet (40 mg total) by mouth daily. 30 tablet 3  . insulin aspart (NOVOLOG) 100 UNIT/ML injection Inject 15 Units into the skin 3 (three) times daily as needed for high blood sugar.     . insulin glargine (LANTUS) 100 UNIT/ML injection Inject 0.37 mLs (37 Units total) into the skin daily. 10 mL 11  . losartan (COZAAR) 100 MG tablet Take 100 mg by mouth daily.    Marland Kitchen MAGNESIUM PO Take 4 tablets by mouth daily.     . montelukast (SINGULAIR) 10 MG tablet Take 10 mg by mouth at bedtime.    . montelukast (SINGULAIR) 10 MG tablet TAKE 1 TABLET (10 MG TOTAL) BY MOUTH NIGHTLY.    Marland Kitchen Olopatadine HCl (PATADAY) 0.2 % SOLN Place 1 drop into both eyes daily.    . Omega-3 Fatty Acids (OMEGA 3 PO) Take 3 capsules by mouth daily.    Marland Kitchen omeprazole (PRILOSEC) 40 MG capsule Take by mouth. Reported on 04/21/2015    . pregabalin (LYRICA) 150 MG capsule Take 2 capsules (300 mg total) by mouth at bedtime. 60 capsule 5  . spironolactone (ALDACTONE) 25 MG tablet Take 1 tablet (25 mg total) by mouth 2 (two) times daily. 60 tablet 0  . traZODone (DESYREL) 150 MG tablet Take 1 tablet (150 mg total) by mouth at bedtime. 30 tablet 5  . vitamin B-12 (CYANOCOBALAMIN) 1000 MCG tablet Take 4,000 mcg by mouth daily.    . bosutinib (BOSULIF) 100 MG tablet Take 4 tablets (400 mg  total) by mouth daily with supper. Take with food. 120 tablet 2   No current facility-administered medications for this visit.    PHYSICAL EXAMINATION: ECOG PERFORMANCE STATUS: 1 - Symptomatic but completely ambulatory   General: Well-developed, well-nourished, no acute distress. Eyes: Pink conjunctiva, anicteric sclera. HEENT: Normocephalic, moist mucous membranes, clear oropharnyx. Lungs: Clear to auscultation bilaterally. Heart: Regular rate and rhythm. No rubs, murmurs, or gallops. Abdomen: Soft, nontender, nondistended. No organomegaly noted, normoactive bowel sounds. Musculoskeletal: No edema, cyanosis, or clubbing. Neuro:  Alert, answering all questions appropriately. Cranial nerves grossly intact. Skin: No rashes or petechiae noted. Psych: Normal affect. Lymphatics: No cervical, calvicular, axillary or inguinal LAD.  BP 145/77 mmHg  Pulse 77  Temp(Src) 97.3 F (36.3 C) (Tympanic)  Wt 310 lb 3 oz (140.7 kg)  Filed Weights   04/21/15 1203  Weight: 310 lb 3 oz (140.7 kg)    LABORATORY DATA:  I have reviewed the data as listed    Component Value Date/Time   NA 132* 04/19/2015 1407   NA 136 03/02/2014 1411   K 4.1 04/19/2015 1407   K 4.0 03/02/2014 1411   CL 99* 04/19/2015 1407   CL 97* 03/02/2014 1411   CO2 26 04/19/2015 1407   CO2 32 03/02/2014 1411   GLUCOSE 187* 04/19/2015 1407   GLUCOSE 141* 03/02/2014 1411   BUN 21* 04/19/2015 1407   BUN 18 03/02/2014 1411   CREATININE 1.06* 04/19/2015 1407   CREATININE 1.26 03/02/2014 1411   CALCIUM 8.7* 04/19/2015 1407   CALCIUM 9.1 03/02/2014 1411   PROT 7.2 04/19/2015 1407   PROT 7.1 03/02/2014 1411   ALBUMIN 3.3* 04/19/2015 1407   ALBUMIN 2.9* 03/02/2014 1411   AST 38 04/19/2015 1407   AST 17 03/02/2014 1411   ALT 50 04/19/2015 1407   ALT 21 03/02/2014 1411   ALKPHOS 59 04/19/2015 1407   ALKPHOS 69 03/02/2014 1411   BILITOT 0.3 04/19/2015 1407   BILITOT 0.4 03/02/2014 1411   GFRNONAA 56* 04/19/2015 1407    GFRNONAA 46* 03/02/2014 1411   GFRNONAA >60 11/11/2013 1451   GFRAA >60 04/19/2015 1407   GFRAA 56* 03/02/2014 1411   GFRAA >60 11/11/2013 1451    No results found for: SPEP, UPEP  Lab Results  Component Value Date   WBC 6.0 04/19/2015   NEUTROABS 4.4 04/19/2015   HGB 11.7* 04/19/2015   HCT 35.5 04/19/2015   MCV 84.8 04/19/2015   PLT 238 04/19/2015      Chemistry      Component Value Date/Time   NA 132* 04/19/2015 1407   NA 136 03/02/2014 1411   K 4.1 04/19/2015 1407   K 4.0 03/02/2014 1411   CL 99* 04/19/2015 1407   CL 97* 03/02/2014 1411   CO2 26 04/19/2015 1407   CO2 32 03/02/2014 1411   BUN 21* 04/19/2015 1407   BUN 18 03/02/2014 1411   CREATININE 1.06* 04/19/2015 1407   CREATININE 1.26 03/02/2014 1411      Component Value Date/Time   CALCIUM 8.7* 04/19/2015 1407   CALCIUM 9.1 03/02/2014 1411   ALKPHOS 59 04/19/2015 1407   ALKPHOS 69 03/02/2014 1411   AST 38 04/19/2015 1407   AST 17 03/02/2014 1411   ALT 50 04/19/2015 1407   ALT 21 03/02/2014 1411   BILITOT 0.3 04/19/2015 1407   BILITOT 0.4 03/02/2014 1411        No results found.   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   1. CML:  After lengthy discussion with the patient she has agreed to continue Bosulif 300 mg daily. Will repeat BCR this ABL transcript in April 2017. She has knowledge that she may have to increase her dose to 400 or 500 mg daily. It was previously offered to the patient for a referral for second opinion since her disease is difficult to control, but she has declined.  Return to clinic at the end of April with repeat laboratory work  and further evaluation.  Approximately 30 minutes was spent in discussion of which greater than 50% was consultation.    Lloyd Huger, MD 05/03/2015 4:38 PM

## 2015-05-09 ENCOUNTER — Other Ambulatory Visit: Payer: Medicaid Other

## 2015-05-09 ENCOUNTER — Ambulatory Visit: Payer: Medicaid Other | Admitting: Internal Medicine

## 2015-05-11 ENCOUNTER — Other Ambulatory Visit: Payer: Self-pay | Admitting: Psychiatry

## 2015-05-30 ENCOUNTER — Encounter: Payer: Self-pay | Admitting: Psychiatry

## 2015-05-30 ENCOUNTER — Ambulatory Visit (INDEPENDENT_AMBULATORY_CARE_PROVIDER_SITE_OTHER): Payer: Medicaid Other | Admitting: Psychiatry

## 2015-05-30 VITALS — BP 138/76 | HR 92 | Temp 98.2°F | Ht 67.0 in | Wt 320.0 lb

## 2015-05-30 DIAGNOSIS — F3130 Bipolar disorder, current episode depressed, mild or moderate severity, unspecified: Secondary | ICD-10-CM

## 2015-05-30 DIAGNOSIS — F4310 Post-traumatic stress disorder, unspecified: Secondary | ICD-10-CM | POA: Diagnosis not present

## 2015-05-30 MED ORDER — PREGABALIN 150 MG PO CAPS: 300.0000 mg | ORAL_CAPSULE | Freq: Every day | ORAL | Status: DC

## 2015-05-30 MED ORDER — TRAZODONE HCL 150 MG PO TABS
150.0000 mg | ORAL_TABLET | Freq: Every day | ORAL | Status: DC
Start: 2015-05-30 — End: 2016-03-11

## 2015-05-30 MED ORDER — ALPRAZOLAM 1 MG PO TABS: 1.0000 mg | ORAL_TABLET | Freq: Three times a day (TID) | ORAL | Status: DC

## 2015-05-30 MED ORDER — CYMBALTA 20 MG PO CPEP: 20.0000 mg | ORAL_CAPSULE | Freq: Three times a day (TID) | ORAL | Status: DC

## 2015-06-20 ENCOUNTER — Other Ambulatory Visit: Payer: Self-pay | Admitting: Psychiatry

## 2015-06-23 ENCOUNTER — Ambulatory Visit: Payer: Medicaid Other | Admitting: Oncology

## 2015-06-30 ENCOUNTER — Other Ambulatory Visit: Payer: Medicaid Other

## 2015-07-07 ENCOUNTER — Other Ambulatory Visit: Payer: Medicaid Other

## 2015-07-11 ENCOUNTER — Inpatient Hospital Stay: Payer: Medicaid Other | Attending: Internal Medicine

## 2015-07-11 DIAGNOSIS — E785 Hyperlipidemia, unspecified: Secondary | ICD-10-CM | POA: Diagnosis not present

## 2015-07-11 DIAGNOSIS — J449 Chronic obstructive pulmonary disease, unspecified: Secondary | ICD-10-CM | POA: Insufficient documentation

## 2015-07-11 DIAGNOSIS — E119 Type 2 diabetes mellitus without complications: Secondary | ICD-10-CM | POA: Insufficient documentation

## 2015-07-11 DIAGNOSIS — Z79899 Other long term (current) drug therapy: Secondary | ICD-10-CM | POA: Diagnosis not present

## 2015-07-11 DIAGNOSIS — C9210 Chronic myeloid leukemia, BCR/ABL-positive, not having achieved remission: Secondary | ICD-10-CM | POA: Diagnosis present

## 2015-07-11 DIAGNOSIS — I10 Essential (primary) hypertension: Secondary | ICD-10-CM | POA: Insufficient documentation

## 2015-07-11 DIAGNOSIS — M1990 Unspecified osteoarthritis, unspecified site: Secondary | ICD-10-CM | POA: Diagnosis not present

## 2015-07-11 LAB — COMPREHENSIVE METABOLIC PANEL
ALT: 55 U/L — AB (ref 14–54)
AST: 55 U/L — ABNORMAL HIGH (ref 15–41)
Albumin: 3.1 g/dL — ABNORMAL LOW (ref 3.5–5.0)
Alkaline Phosphatase: 68 U/L (ref 38–126)
Anion gap: 7 (ref 5–15)
BUN: 21 mg/dL — ABNORMAL HIGH (ref 6–20)
CHLORIDE: 98 mmol/L — AB (ref 101–111)
CO2: 29 mmol/L (ref 22–32)
CREATININE: 0.97 mg/dL (ref 0.44–1.00)
Calcium: 9.1 mg/dL (ref 8.9–10.3)
GFR calc non Af Amer: >60 mL/min (ref >60)
Glucose, Bld: 256 mg/dL — ABNORMAL HIGH (ref 65–99)
POTASSIUM: 4.3 mmol/L (ref 3.5–5.1)
SODIUM: 134 mmol/L — AB (ref 135–145)
Total Bilirubin: 0.4 mg/dL (ref 0.3–1.2)
Total Protein: 7.3 g/dL (ref 6.5–8.1)

## 2015-07-11 LAB — CBC WITH DIFFERENTIAL/PLATELET
BASOS ABS: 0 10*3/uL (ref 0–0.1)
Basophils Relative: 0 %
EOS ABS: 0.1 10*3/uL (ref 0–0.7)
HCT: 36.6 % (ref 35.0–47.0)
Hemoglobin: 12.1 g/dL (ref 12.0–16.0)
LYMPHS ABS: 1.2 10*3/uL (ref 1.0–3.6)
MCH: 27.6 pg (ref 26.0–34.0)
MCHC: 33 g/dL (ref 32.0–36.0)
MCV: 83.8 fL (ref 80.0–100.0)
MONO ABS: 0.5 10*3/uL (ref 0.2–0.9)
Monocytes Relative: 6 %
Neutro Abs: 6.3 10*3/uL (ref 1.4–6.5)
Neutrophils Relative %: 78 %
PLATELETS: 246 10*3/uL (ref 150–440)
RBC: 4.37 MIL/uL (ref 3.80–5.20)
RDW: 14.6 % — AB (ref 11.5–14.5)
WBC: 8.1 10*3/uL (ref 3.6–11.0)

## 2015-07-14 ENCOUNTER — Inpatient Hospital Stay (HOSPITAL_BASED_OUTPATIENT_CLINIC_OR_DEPARTMENT_OTHER): Payer: Medicaid Other | Admitting: Oncology

## 2015-07-14 VITALS — BP 143/90 | HR 87 | Temp 97.0°F | Resp 18

## 2015-07-14 DIAGNOSIS — Z79899 Other long term (current) drug therapy: Secondary | ICD-10-CM | POA: Diagnosis not present

## 2015-07-14 DIAGNOSIS — C9210 Chronic myeloid leukemia, BCR/ABL-positive, not having achieved remission: Secondary | ICD-10-CM | POA: Diagnosis not present

## 2015-07-14 LAB — BCR-ABL1, CML/ALL, PCR, QUANT: B3A2 TRANSCRIPT: 0.012 %

## 2015-07-14 NOTE — Progress Notes (Signed)
[facility]  [phone]  [phone]) [place] DOB: October 29, 1955  MR#: [id number]  JYN#:[id number]  Patient Care Team: Valera Castle, MD as PCP - General  CHIEF COMPLAINT:  Chief Complaint  Patient presents with  . CML    INTERVAL HISTORY: Patient is here today for evaluation and lab review. She has complaints of bone pain that is chronic but otherwise feels well. She is currently taking 400 mg Bosulif daily. She does have a long history of noncompliance and admits to missing a week of treatment in March 2017 and then a day last week otherwise states she has been taking regularly. She is no neurologic complaints. She denies any fevers, chills, night sweats, or weight loss. She has no chest pain or shortness of breath. She denies any nausea, vomiting, constipation, or diarrhea. She has no urinary complaints.  REVIEW OF SYSTEMS:   Review of Systems  Constitutional: Negative.   Eyes: Negative.   Respiratory: Negative.   Cardiovascular: Negative.   Gastrointestinal: Negative.   Genitourinary: Negative.   Musculoskeletal: Positive for myalgias, back pain, joint pain and neck pain.  Skin: Negative.   Neurological: Negative.   Psychiatric/Behavioral: Negative.     As per HPI. Otherwise, a complete review of systems is negatve.  ONCOLOGY HISTORY:  No history exists.    PAST MEDICAL HISTORY: Past Medical History  Diagnosis Date  . Unspecified essential hypertension   . HLD (hyperlipidemia)   . DM type 2 (diabetes mellitus, type 2) ([place])   . Environmental allergies   . Osteoarthritis   . IDA (iron deficiency anemia)     chronic  . History of gastric ulcer   . Panic disorder   . PSS (progressive systemic sclerosis) ([place])   . COPD (chronic obstructive pulmonary disease) ([place])   . CML (chronic myelocytic leukemia) ([place])   . Leukemia, chronic myeloid ([place])   . Depression     PAST SURGICAL HISTORY: Past Surgical History    Procedure Laterality Date  . Back and neck surgery  1999, 2009    had rods placed  . Partial hysterectomy      age 61, no cancer  . Carpal tunnel release    . Cardiac catheterization  [facility]) No blackages found.     FAMILY HISTORY Family History  Problem Relation Age of Onset  . Diabetes Mother   . Hypertension Mother   . Heart disease Mother   . Prostate cancer Father   . Heart disease Father   . Diabetes Brother     GYNECOLOGIC HISTORY:  No LMP recorded. Patient has had a hysterectomy.     ADVANCED DIRECTIVES:    HEALTH MAINTENANCE: Social History  Substance Use Topics  . Smoking status: Never Smoker   . Smokeless tobacco: Never Used     Comment: quit 1985  . Alcohol Use: No    Allergies  Allergen Reactions  . Codeine Other (See Comments)    Reaction: Unknown   . Latex Other (See Comments)    Reaction:  Unknown   . Morphine And Related Other (See Comments)    Reaction:  Unknown   . Relafen [Nabumetone] Hives  . Tape Other (See Comments)    Reaction:  Unknown     Current Outpatient Prescriptions  Medication Sig Dispense Refill  . acetaminophen-codeine (TYLENOL #3) 300-30 MG per tablet Take 1 tablet by mouth every 12 (twelve) hours as needed for moderate pain.     Marland Kitchen  albuterol (PROVENTIL HFA;VENTOLIN HFA) 108 (90 BASE) MCG/ACT inhaler Inhale 2 puffs into the lungs every 6 (six) hours as needed for wheezing or shortness of breath.    Marland Kitchen albuterol (PROVENTIL) (2.5 MG/3ML) 0.083% nebulizer solution INHALE CONTENTS OF 1 VIAL VIA NEBULIZER THREE TIMES A DAY prn    . ALPRAZolam (XANAX) 1 MG tablet Take 1 tablet (1 mg total) by mouth 3 (three) times daily. 90 tablet 5  . Ascorbic Acid (VITAMIN C) 1000 MG tablet Take 3,000 mg by mouth daily.    . bosutinib (BOSULIF) 100 MG tablet Take 300 mg by mouth daily.     . celecoxib (CELEBREX) 200 MG capsule TAKE 1 CAPSULE (200 MG TOTAL) BY MOUTH TWO (2) TIMES A DAY.    . cetirizine (ZYRTEC) 10  MG tablet Take 20 mg by mouth daily.     . Cholecalciferol (VITAMIN D PO) Take 1 tablet by mouth daily.    Marland Kitchen co-enzyme Q-10 50 MG capsule Take 50 mg by mouth daily.    . cyanocobalamin (,VITAMIN B-12,) 1000 MCG/ML injection Inject 1,000 mcg into the muscle every 30 (thirty) days.    . CYMBALTA 20 MG capsule Take 1 capsule (20 mg total) by mouth 3 (three) times daily. 90 capsule 5  . diphenhydrAMINE (BENADRYL) 25 MG tablet Take 25 mg by mouth every 6 (six) hours as needed for itching or allergies.     . DULoxetine (CYMBALTA) 20 MG capsule Take 1 capsule (20 mg total) by mouth 3 (three) times daily. 90 capsule 5  . estradiol (ESTRACE) 2 MG tablet Take 2 mg by mouth daily.    . fluconazole (DIFLUCAN) 150 MG tablet     . fluticasone (FLONASE) 50 MCG/ACT nasal spray Place 1-2 sprays into both nostrils as needed for rhinitis.     . furosemide (LASIX) 40 MG tablet Take 1 tablet (40 mg total) by mouth daily. 30 tablet 3  . insulin aspart (NOVOLOG) 100 UNIT/ML injection Inject 15 Units into the skin 3 (three) times daily as needed for high blood sugar.     . insulin glargine (LANTUS) 100 UNIT/ML injection Inject 0.37 mLs (37 Units total) into the skin daily. 10 mL 11  . losartan (COZAAR) 100 MG tablet Take 100 mg by mouth daily.    Marland Kitchen MAGNESIUM PO Take 4 tablets by mouth daily.     . montelukast (SINGULAIR) 10 MG tablet TAKE 1 TABLET (10 MG TOTAL) BY MOUTH NIGHTLY.    Marland Kitchen Olopatadine HCl (PATADAY) 0.2 % SOLN Place 1 drop into both eyes daily.    . Omega-3 Fatty Acids (OMEGA 3 PO) Take 3 capsules by mouth daily.    Marland Kitchen omeprazole (PRILOSEC) 40 MG capsule Take by mouth. Reported on 04/21/2015    . pregabalin (LYRICA) 150 MG capsule Take 2 capsules (300 mg total) by mouth at bedtime. 60 capsule 5  . spironolactone (ALDACTONE) 25 MG tablet Take 1 tablet (25 mg total) by mouth 2 (two) times daily. 60 tablet 0  . traZODone (DESYREL) 150 MG tablet Take 1 tablet (150 mg total) by mouth at bedtime. 30 tablet 5  .  vitamin B-12 (CYANOCOBALAMIN) 1000 MCG tablet Take 4,000 mcg by mouth daily.     No current facility-administered medications for this visit.    OBJECTIVE: BP 143/90 mmHg  Pulse 87  Temp(Src) 97 F (36.1 C) (Tympanic)  Resp 18   There is no weight on file to calculate BMI.    ECOG FS:1 - Symptomatic but completely ambulatory  General: Well-developed, obese, no acute distress. Eyes: Pink conjunctiva, anicteric sclera. HEENT: Normocephalic, moist mucous membranes, clear oropharnyx. Lungs: Clear to auscultation bilaterally. Heart: Regular rate and rhythm. No rubs, murmurs, or gallops. Abdomen: Soft, nontender, nondistended. No organomegaly noted, normoactive bowel sounds. Musculoskeletal: No edema, cyanosis, or clubbing. Neuro: Alert, answering all questions appropriately. Cranial nerves grossly intact. Skin: No rashes or petechiae noted. Psych: Normal affect.  LAB RESULTS:  No visits with results within 3 Day(s) from this visit. Latest known visit with results is:  Appointment on 07/11/2015  Component Date Value Ref Range Status  . WBC 07/11/2015 8.1  3.6 - 11.0 K/uL Final  . RBC 07/11/2015 4.37  3.80 - 5.20 MIL/uL Final  . Hemoglobin 07/11/2015 12.1  12.0 - 16.0 g/dL Final  . HCT 07/11/2015 36.6  35.0 - 47.0 % Final  . MCV 07/11/2015 83.8  80.0 - 100.0 fL Final  . MCH 07/11/2015 27.6  26.0 - 34.0 pg Final  . MCHC 07/11/2015 33.0  32.0 - 36.0 g/dL Final  . RDW 07/11/2015 14.6* 11.5 - 14.5 % Final  . Platelets 07/11/2015 246  150 - 440 K/uL Final  . Neutrophils Relative % 07/11/2015 78%   Final  . Neutro Abs 07/11/2015 6.3  1.4 - 6.5 K/uL Final  . Lymphocytes Relative 07/11/2015 14%   Final  . Lymphs Abs 07/11/2015 1.2  1.0 - 3.6 K/uL Final  . Monocytes Relative 07/11/2015 6%   Final  . Monocytes Absolute 07/11/2015 0.5  0.2 - 0.9 K/uL Final  . Eosinophils Relative 07/11/2015 2%   Final  . Eosinophils Absolute 07/11/2015 0.1  0 - 0.7 K/uL Final  . Basophils Relative  07/11/2015 0%   Final  . Basophils Absolute 07/11/2015 0.0  0 - 0.1 K/uL Final  . Sodium 07/11/2015 134* 135 - 145 mmol/L Final  . Potassium 07/11/2015 4.3  3.5 - 5.1 mmol/L Final  . Chloride 07/11/2015 98* 101 - 111 mmol/L Final  . CO2 07/11/2015 29  22 - 32 mmol/L Final  . Glucose, Bld 07/11/2015 256* 65 - 99 mg/dL Final  . BUN 07/11/2015 21* 6 - 20 mg/dL Final  . Creatinine, Ser 07/11/2015 0.97  0.44 - 1.00 mg/dL Final  . Calcium 07/11/2015 9.1  8.9 - 10.3 mg/dL Final  . Total Protein 07/11/2015 7.3  6.5 - 8.1 g/dL Final  . Albumin 07/11/2015 3.1* 3.5 - 5.0 g/dL Final  . AST 07/11/2015 55* 15 - 41 U/L Final  . ALT 07/11/2015 55* 14 - 54 U/L Final  . Alkaline Phosphatase 07/11/2015 68  38 - 126 U/L Final  . Total Bilirubin 07/11/2015 0.4  0.3 - 1.2 mg/dL Final  . GFR calc non Af Amer 07/11/2015 >60  >60 mL/min Final  . GFR calc Af Amer 07/11/2015 >60  >60 mL/min Final   Comment: (NOTE) The eGFR has been calculated using the CKD EPI equation. This calculation has not been validated in all clinical situations. eGFR's persistently <60 mL/min signify possible Chronic Kidney Disease.   . Anion gap 07/11/2015 7  5 - 15 Final    STUDIES: No results found.  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 Non-compliance/SE-hypomag]; START BOSULIF [no Mutation analysis done] [460m]; May 2015- bcr-abl-Undetectable; 208m[AE-LFTs] SEP 2015- bcr-abl- Increase 0.052%; 10050mnon-compliance] JAN 2016 bcr-abl Increased 5.456%; 300m60mune 2016- bcr-abl <0.001%; STOPPED Bosulif end SEP 2016 [non-compliance];Sep 2016- B3 A2- 0.026% [increasing]; OCT  2016- MUTATION ANALYSIS- not done sec to lack of enough transcripts;  # NOV 1st Re-START BOLSULIF at 312m/d; JAN 2017- 2.44 [RISING]  PLAN:    1. CML: Patient self increased her Bosulif from 300 to 400 mg daily after her last visit  in March 2017. BCR-ABL is still pending for today. She will continue with the 400 mg dosing for now and Return to clinic in 3 months with repeat labs one week prior to her office visit. It was previously offered to the patient for a referral for second opinion since her disease is difficult to control, but she has declined. 2. Pain: As per treatment of her primary care Physician.  Patient expressed understanding and was in agreement with this plan. She also understands that She can call clinic at any time with any questions, concerns, or complaints.    TMayra Reel NP   07/14/2015 11:57 AM  Patient was seen and evaluated independently and I agree with the assessment and plan as dictated above.  TLloyd Huger MD 07/14/2015 5:01 PM

## 2015-07-21 ENCOUNTER — Other Ambulatory Visit: Payer: Self-pay | Admitting: Psychiatry

## 2015-07-25 ENCOUNTER — Telehealth: Payer: Self-pay

## 2015-07-25 NOTE — Telephone Encounter (Signed)
denied both the cymbalta and lyrica.  pt needs to try and fail two preferred drugs before prior auth will be approved.

## 2015-07-27 ENCOUNTER — Ambulatory Visit: Payer: Self-pay | Admitting: Psychiatry

## 2015-07-31 ENCOUNTER — Other Ambulatory Visit: Payer: Self-pay | Admitting: Psychiatry

## 2015-07-31 MED ORDER — PREGABALIN 150 MG PO CAPS: 300.0000 mg | ORAL_CAPSULE | Freq: Every day | ORAL | Status: DC

## 2015-07-31 MED ORDER — CYMBALTA 20 MG PO CPEP: 20.0000 mg | ORAL_CAPSULE | Freq: Three times a day (TID) | ORAL | Status: DC

## 2015-07-31 NOTE — Telephone Encounter (Signed)
pt called again. pt is out of medication and needs refills.

## 2015-07-31 NOTE — Telephone Encounter (Signed)
pt called left message on voice mail that she needed refills on both lyrica and cymbalta. pt had been made aware on a previous phone call that insurance denied both medication and that a messgae was sent to dr. Algie Coffer about the denial

## 2015-08-02 ENCOUNTER — Telehealth: Payer: Self-pay | Admitting: Psychiatry

## 2015-08-03 DIAGNOSIS — F339 Major depressive disorder, recurrent, unspecified: Secondary | ICD-10-CM | POA: Insufficient documentation

## 2015-08-08 ENCOUNTER — Telehealth: Payer: Self-pay | Admitting: Psychiatry

## 2015-08-08 ENCOUNTER — Ambulatory Visit: Payer: Self-pay | Admitting: Psychiatry

## 2015-08-16 ENCOUNTER — Other Ambulatory Visit: Payer: Self-pay | Admitting: *Deleted

## 2015-08-16 DIAGNOSIS — C9210 Chronic myeloid leukemia, BCR/ABL-positive, not having achieved remission: Secondary | ICD-10-CM

## 2015-08-16 MED ORDER — SPIRONOLACTONE 25 MG PO TABS: 25.0000 mg | ORAL_TABLET | Freq: Two times a day (BID) | ORAL | Status: DC

## 2015-08-21 ENCOUNTER — Other Ambulatory Visit: Payer: Self-pay | Admitting: *Deleted

## 2015-08-21 DIAGNOSIS — C9210 Chronic myeloid leukemia, BCR/ABL-positive, not having achieved remission: Secondary | ICD-10-CM

## 2015-08-23 ENCOUNTER — Other Ambulatory Visit: Payer: Self-pay | Admitting: *Deleted

## 2015-08-23 NOTE — Telephone Encounter (Signed)
Do you want to refill her Lasix?

## 2015-08-23 NOTE — Telephone Encounter (Signed)
Okay 

## 2015-08-28 ENCOUNTER — Ambulatory Visit: Payer: Self-pay | Admitting: Unknown Physician Specialty

## 2015-09-14 ENCOUNTER — Encounter: Payer: Self-pay | Admitting: Psychiatry

## 2015-09-14 ENCOUNTER — Ambulatory Visit (INDEPENDENT_AMBULATORY_CARE_PROVIDER_SITE_OTHER): Payer: Medicaid Other | Admitting: Psychiatry

## 2015-09-14 VITALS — BP 169/101 | HR 89 | Temp 98.5°F | Ht 67.0 in | Wt 328.4 lb

## 2015-09-14 DIAGNOSIS — F3130 Bipolar disorder, current episode depressed, mild or moderate severity, unspecified: Secondary | ICD-10-CM | POA: Diagnosis not present

## 2015-09-14 MED ORDER — BUPROPION HCL ER (SR) 150 MG PO TB12: 150.0000 mg | ORAL_TABLET | Freq: Two times a day (BID) | ORAL | Status: DC

## 2015-09-16 ENCOUNTER — Other Ambulatory Visit: Payer: Self-pay | Admitting: Oncology

## 2015-10-12 ENCOUNTER — Other Ambulatory Visit: Payer: Self-pay | Admitting: *Deleted

## 2015-10-12 DIAGNOSIS — C9210 Chronic myeloid leukemia, BCR/ABL-positive, not having achieved remission: Secondary | ICD-10-CM

## 2015-10-13 ENCOUNTER — Other Ambulatory Visit: Payer: Self-pay | Admitting: *Deleted

## 2015-10-13 ENCOUNTER — Inpatient Hospital Stay: Payer: Medicaid Other

## 2015-10-13 DIAGNOSIS — C9210 Chronic myeloid leukemia, BCR/ABL-positive, not having achieved remission: Secondary | ICD-10-CM

## 2015-10-16 ENCOUNTER — Inpatient Hospital Stay: Payer: Medicaid Other | Attending: Internal Medicine

## 2015-10-16 ENCOUNTER — Other Ambulatory Visit: Payer: Self-pay

## 2015-10-16 DIAGNOSIS — E785 Hyperlipidemia, unspecified: Secondary | ICD-10-CM | POA: Diagnosis not present

## 2015-10-16 DIAGNOSIS — Z79899 Other long term (current) drug therapy: Secondary | ICD-10-CM | POA: Diagnosis not present

## 2015-10-16 DIAGNOSIS — J449 Chronic obstructive pulmonary disease, unspecified: Secondary | ICD-10-CM | POA: Diagnosis not present

## 2015-10-16 DIAGNOSIS — C9210 Chronic myeloid leukemia, BCR/ABL-positive, not having achieved remission: Secondary | ICD-10-CM

## 2015-10-16 DIAGNOSIS — E119 Type 2 diabetes mellitus without complications: Secondary | ICD-10-CM | POA: Diagnosis not present

## 2015-10-16 DIAGNOSIS — M1990 Unspecified osteoarthritis, unspecified site: Secondary | ICD-10-CM | POA: Insufficient documentation

## 2015-10-16 DIAGNOSIS — I10 Essential (primary) hypertension: Secondary | ICD-10-CM | POA: Diagnosis not present

## 2015-10-16 LAB — COMPREHENSIVE METABOLIC PANEL
ALBUMIN: 3.7 g/dL (ref 3.5–5.0)
ALK PHOS: 51 U/L (ref 38–126)
ALT: 81 U/L — AB (ref 14–54)
AST: 61 U/L — ABNORMAL HIGH (ref 15–41)
Anion gap: 9 (ref 5–15)
BILIRUBIN TOTAL: 0.5 mg/dL (ref 0.3–1.2)
BUN: 23 mg/dL — ABNORMAL HIGH (ref 6–20)
CALCIUM: 9.9 mg/dL (ref 8.9–10.3)
CO2: 27 mmol/L (ref 22–32)
CREATININE: 1.21 mg/dL — AB (ref 0.44–1.00)
Chloride: 98 mmol/L — ABNORMAL LOW (ref 101–111)
GFR calc Af Amer: 55 mL/min — ABNORMAL LOW (ref >60)
GFR calc non Af Amer: 48 mL/min — ABNORMAL LOW (ref >60)
GLUCOSE: 160 mg/dL — AB (ref 65–99)
Potassium: 4.4 mmol/L (ref 3.5–5.1)
SODIUM: 134 mmol/L — AB (ref 135–145)
TOTAL PROTEIN: 7.4 g/dL (ref 6.5–8.1)

## 2015-10-16 LAB — CBC WITH DIFFERENTIAL/PLATELET
BASOS PCT: 0 %
Basophils Absolute: 0 10*3/uL (ref 0–0.1)
EOS ABS: 0.1 10*3/uL (ref 0–0.7)
Eosinophils Relative: 2 %
HEMATOCRIT: 39.7 % (ref 35.0–47.0)
HEMOGLOBIN: 12.9 g/dL (ref 12.0–16.0)
LYMPHS ABS: 1.1 10*3/uL (ref 1.0–3.6)
Lymphocytes Relative: 14 %
MCH: 27.7 pg (ref 26.0–34.0)
MCHC: 32.6 g/dL (ref 32.0–36.0)
MCV: 85 fL (ref 80.0–100.0)
MONOS PCT: 5 %
Monocytes Absolute: 0.4 10*3/uL (ref 0.2–0.9)
NEUTROS ABS: 6.4 10*3/uL (ref 1.4–6.5)
NEUTROS PCT: 79 %
Platelets: 238 10*3/uL (ref 150–440)
RBC: 4.68 MIL/uL (ref 3.80–5.20)
RDW: 14.5 % (ref 11.5–14.5)
WBC: 8 10*3/uL (ref 3.6–11.0)

## 2015-10-17 ENCOUNTER — Other Ambulatory Visit: Payer: Medicaid Other

## 2015-10-19 NOTE — Progress Notes (Deleted)
[facility]  [phone]  [phone]     Tara Levine DOB: Aug 14, 1955  MR#: [id number]  GRN#:[id number]  Patient Care Team: Dione Housekeeper, MD as PCP - General  CHIEF COMPLAINT: CML  INTERVAL HISTORY: Patient is here today for evaluation and lab review. She has complaints of bone pain that is chronic but otherwise feels well. She is currently taking 400 mg Bosulif daily. She does have a long history of noncompliance and admits to missing a week of treatment in March 2017 and then a day last week otherwise states she has been taking regularly. She is no neurologic complaints. She denies any fevers, chills, night sweats, or weight loss. She has no chest pain or shortness of breath. She denies any nausea, vomiting, constipation, or diarrhea. She has no urinary complaints.  REVIEW OF SYSTEMS:   Review of Systems  Constitutional: Negative.   Eyes: Negative.   Respiratory: Negative.   Cardiovascular: Negative.   Gastrointestinal: Negative.   Genitourinary: Negative.   Musculoskeletal: Positive for back pain, joint pain, myalgias and neck pain.  Skin: Negative.   Neurological: Negative.   Psychiatric/Behavioral: Negative.     As per HPI. Otherwise, a complete review of systems is negatve.  ONCOLOGY HISTORY:  No history exists.    PAST MEDICAL HISTORY: Past Medical History:  Diagnosis Date  . CML (chronic myelocytic leukemia) (HCC)   . COPD (chronic obstructive pulmonary disease) (HCC)   . Depression   . DM type 2 (diabetes mellitus, type 2) (HCC)   . Environmental allergies   . History of gastric ulcer   . HLD (hyperlipidemia)   . IDA (iron deficiency anemia)    chronic  . Leukemia, chronic myeloid (HCC)   . Osteoarthritis   . Panic disorder   . PSS (progressive systemic sclerosis) (HCC)   . Unspecified essential hypertension     PAST SURGICAL HISTORY: Past Surgical History:  Procedure Laterality Date  . back and neck surgery   1999, 2009   had rods placed  . CARDIAC CATHETERIZATION  1990   [facility]) No blackages found.   . CARPAL TUNNEL RELEASE    . PARTIAL HYSTERECTOMY     age 33, no cancer    FAMILY HISTORY Family History  Problem Relation Age of Onset  . Diabetes Mother   . Hypertension Mother   . Heart disease Mother   . Prostate cancer Father   . Heart disease Father   . Diabetes Brother     GYNECOLOGIC HISTORY:  No LMP recorded. Patient has had a hysterectomy.     ADVANCED DIRECTIVES:    HEALTH MAINTENANCE: Social History  Substance Use Topics  . Smoking status: Never Smoker  . Smokeless tobacco: Never Used     Comment: quit 1985  . Alcohol use No    Allergies  Allergen Reactions  . Codeine Other (See Comments)    Reaction: Unknown   . Latex Other (See Comments)    Reaction:  Unknown   . Morphine And Related Other (See Comments)    Reaction:  Unknown   . Relafen [Nabumetone] Hives  . Tape Other (See Comments)    Reaction:  Unknown     Current Outpatient Prescriptions  Medication Sig Dispense Refill  . acetaminophen-codeine (TYLENOL #3) 300-30 MG per tablet Take 1 tablet by mouth every 12 (twelve) hours as needed for moderate pain.     Marland Kitchen albuterol (PROVENTIL HFA;VENTOLIN HFA) 108 (90 BASE) MCG/ACT inhaler Inhale 2 puffs  into the lungs every 6 (six) hours as needed for wheezing or shortness of breath.    Marland Kitchen albuterol (PROVENTIL) (2.5 MG/3ML) 0.083% nebulizer solution INHALE CONTENTS OF 1 VIAL VIA NEBULIZER THREE TIMES A DAY prn    . ALPRAZolam (XANAX) 1 MG tablet TAKE 1 TABLET THREE TIMES A DAY 90 tablet 3  . Ascorbic Acid (VITAMIN C) 1000 MG tablet Take 3,000 mg by mouth daily.    . bosutinib (BOSULIF) 100 MG tablet Take 300 mg by mouth daily.     Marland Kitchen buPROPion (WELLBUTRIN SR) 150 MG 12 hr tablet Take 1 tablet (150 mg total) by mouth 2 (two) times daily. 60 tablet 3  . celecoxib (CELEBREX) 200 MG capsule TAKE 1 CAPSULE (200 MG TOTAL) BY MOUTH TWO (2) TIMES A  DAY.    . cetirizine (ZYRTEC) 10 MG tablet Take 20 mg by mouth daily.     . Cholecalciferol (VITAMIN D PO) Take 1 tablet by mouth daily.    Marland Kitchen co-enzyme Q-10 50 MG capsule Take 50 mg by mouth daily.    . cyanocobalamin (,VITAMIN B-12,) 1000 MCG/ML injection Inject 1,000 mcg into the muscle every 30 (thirty) days.    . CYMBALTA 20 MG capsule Take 1 capsule (20 mg total) by mouth 3 (three) times daily. 90 capsule 5  . diphenhydrAMINE (BENADRYL) 25 MG tablet Take 25 mg by mouth every 6 (six) hours as needed for itching or allergies.     . DULoxetine (CYMBALTA) 20 MG capsule Take 1 capsule (20 mg total) by mouth 3 (three) times daily. 90 capsule 5  . estradiol (ESTRACE) 2 MG tablet Take 2 mg by mouth daily.    . fluconazole (DIFLUCAN) 150 MG tablet     . fluticasone (FLONASE) 50 MCG/ACT nasal spray Place 1-2 sprays into both nostrils as needed for rhinitis.     . furosemide (LASIX) 40 MG tablet Take 1 tablet (40 mg total) by mouth daily. 30 tablet 3  . insulin aspart (NOVOLOG) 100 UNIT/ML injection Inject 15 Units into the skin 3 (three) times daily as needed for high blood sugar.     . insulin glargine (LANTUS) 100 UNIT/ML injection Inject 0.37 mLs (37 Units total) into the skin daily. 10 mL 11  . losartan (COZAAR) 100 MG tablet Take 100 mg by mouth daily.    Marland Kitchen MAGNESIUM PO Take 4 tablets by mouth daily.     . montelukast (SINGULAIR) 10 MG tablet TAKE 1 TABLET (10 MG TOTAL) BY MOUTH NIGHTLY.    Marland Kitchen Olopatadine HCl (PATADAY) 0.2 % SOLN Place 1 drop into both eyes daily.    . Omega-3 Fatty Acids (OMEGA 3 PO) Take 3 capsules by mouth daily.    Marland Kitchen omeprazole (PRILOSEC) 40 MG capsule Take by mouth. Reported on 04/21/2015    . pregabalin (LYRICA) 150 MG capsule Take 2 capsules (300 mg total) by mouth at bedtime. 60 capsule 5  . spironolactone (ALDACTONE) 25 MG tablet Take 1 tablet (25 mg total) by mouth 2 (two) times daily. 60 tablet 0  . traZODone (DESYREL) 150 MG tablet Take 1 tablet (150 mg total) by  mouth at bedtime. 30 tablet 5  . vitamin B-12 (CYANOCOBALAMIN) 1000 MCG tablet Take 4,000 mcg by mouth daily.     No current facility-administered medications for this visit.     OBJECTIVE: There were no vitals taken for this visit.   There is no height or weight on file to calculate BMI.    ECOG FS:1 - Symptomatic but  completely ambulatory  General: Well-developed, obese, no acute distress. Eyes: Pink conjunctiva, anicteric sclera. HEENT: Normocephalic, moist mucous membranes, clear oropharnyx. Lungs: Clear to auscultation bilaterally. Heart: Regular rate and rhythm. No rubs, murmurs, or gallops. Abdomen: Soft, nontender, nondistended. No organomegaly noted, normoactive bowel sounds. Musculoskeletal: No edema, cyanosis, or clubbing. Neuro: Alert, answering all questions appropriately. Cranial nerves grossly intact. Skin: No rashes or petechiae noted. Psych: Normal affect.  LAB RESULTS:  No visits with results within 3 Day(s) from this visit.  Latest known visit with results is:  Orders Only on 10/16/2015  Component Date Value Ref Range Status  . WBC 10/16/2015 8.0  3.6 - 11.0 K/uL Final  . RBC 10/16/2015 4.68  3.80 - 5.20 MIL/uL Final  . Hemoglobin 10/16/2015 12.9  12.0 - 16.0 g/dL Final  . HCT 10/16/2015 39.7  35.0 - 47.0 % Final  . MCV 10/16/2015 85.0  80.0 - 100.0 fL Final  . MCH 10/16/2015 27.7  26.0 - 34.0 pg Final  . MCHC 10/16/2015 32.6  32.0 - 36.0 g/dL Final  . RDW 10/16/2015 14.5  11.5 - 14.5 % Final  . Platelets 10/16/2015 238  150 - 440 K/uL Final  . Neutrophils Relative % 10/16/2015 79  % Final  . Neutro Abs 10/16/2015 6.4  1.4 - 6.5 K/uL Final  . Lymphocytes Relative 10/16/2015 14  % Final  . Lymphs Abs 10/16/2015 1.1  1.0 - 3.6 K/uL Final  . Monocytes Relative 10/16/2015 5  % Final  . Monocytes Absolute 10/16/2015 0.4  0.2 - 0.9 K/uL Final  . Eosinophils Relative 10/16/2015 2  % Final  . Eosinophils Absolute 10/16/2015 0.1  0 - 0.7 K/uL Final  . Basophils  Relative 10/16/2015 0  % Final  . Basophils Absolute 10/16/2015 0.0  0 - 0.1 K/uL Final  . Sodium 10/16/2015 134* 135 - 145 mmol/L Final  . Potassium 10/16/2015 4.4  3.5 - 5.1 mmol/L Final  . Chloride 10/16/2015 98* 101 - 111 mmol/L Final  . CO2 10/16/2015 27  22 - 32 mmol/L Final  . Glucose, Bld 10/16/2015 160* 65 - 99 mg/dL Final  . BUN 10/16/2015 23* 6 - 20 mg/dL Final  . Creatinine, Ser 10/16/2015 1.21* 0.44 - 1.00 mg/dL Final  . Calcium 10/16/2015 9.9  8.9 - 10.3 mg/dL Final  . Total Protein 10/16/2015 7.4  6.5 - 8.1 g/dL Final  . Albumin 10/16/2015 3.7  3.5 - 5.0 g/dL Final  . AST 10/16/2015 61* 15 - 41 U/L Final  . ALT 10/16/2015 81* 14 - 54 U/L Final  . Alkaline Phosphatase 10/16/2015 51  38 - 126 U/L Final  . Total Bilirubin 10/16/2015 0.5  0.3 - 1.2 mg/dL Final  . GFR calc non Af Amer 10/16/2015 48* >60 mL/min Final  . GFR calc Af Amer 10/16/2015 55* >60 mL/min Final   Comment: (NOTE) The eGFR has been calculated using the CKD EPI equation. This calculation has not been validated in all clinical situations. eGFR's persistently <60 mL/min signify possible Chronic Kidney Disease.   . Anion gap 10/16/2015 9  5 - 15 Final    STUDIES: No results found.  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 Non-compliance/SE-hypomag]; START BOSULIF [no Mutation analysis done] ['400mg'$ ]; May 2015- bcr-abl-Undetectable; '200mg'$  [AE-LFTs] SEP 2015- bcr-abl- Increase 0.052%; '100mg'$  [non-compliance] JAN 2016 bcr-abl Increased 5.456%; '300mg'$ ; June 2016- bcr-abl <0.001%; STOPPED Bosulif end SEP 2016 [non-compliance];Sep 2016- B3  A2- 0.026% [increasing]; OCT 2016- MUTATION ANALYSIS- not done sec to lack of enough transcripts;  # NOV 1st Re-START BOLSULIF at '300mg'$ /d; JAN 2017- 2.44 [RISING]  PLAN:    1. CML: Patient self increased her Bosulif from 300 to 400 mg daily after her  last visit in March 2017. BCR-ABL is still pending for today. She will continue with the 400 mg dosing for now and Return to clinic in 3 months with repeat labs one week prior to her office visit. It was previously offered to the patient for a referral for second opinion since her disease is difficult to control, but she has declined. 2. Pain: As per treatment of her primary care Physician.  Patient expressed understanding and was in agreement with this plan. She also understands that She can call clinic at any time with any questions, concerns, or complaints.    Lloyd Huger, MD   10/19/2015 12:13 AM

## 2015-10-20 ENCOUNTER — Other Ambulatory Visit: Payer: Self-pay | Admitting: *Deleted

## 2015-10-20 ENCOUNTER — Telehealth: Payer: Self-pay | Admitting: Oncology

## 2015-10-20 ENCOUNTER — Ambulatory Visit: Payer: Medicaid Other | Admitting: Oncology

## 2015-10-20 DIAGNOSIS — C9210 Chronic myeloid leukemia, BCR/ABL-positive, not having achieved remission: Secondary | ICD-10-CM

## 2015-10-20 NOTE — Telephone Encounter (Signed)
Pt did not have transportation today and had to move her appt. She would like to know how her labs looked though. Please call pt to discuss last labs. Thanks.

## 2015-10-20 NOTE — Telephone Encounter (Signed)
Call returned to patient regarding lab results. CBC and MetC were resulted, BCR/ABL is still pending at this time. Patient verbalized understanding of results, requests that magnesium be drawn due to leg cramping. Patient has follow up scheduled with Dr. Grayland Ormond next Friday 9/1. Mickel Baas, can we please add lab encounter for this visit on 9/1 prior to patients MD appointment.

## 2015-10-21 LAB — BCR-ABL1, CML/ALL, PCR, QUANT

## 2015-10-26 NOTE — Progress Notes (Deleted)
[place]  [phone]  Fax:([phone]) [place] DOB: November 11, 1955  MR#: [id number]  LAG#:[id number]  Patient Care Team: Valera Castle, MD as PCP - General  CHIEF COMPLAINT: CML  INTERVAL HISTORY: Patient is here today for evaluation and lab review. She has complaints of bone pain that is chronic but otherwise feels well. She is currently taking 400 mg Bosulif daily. She does have a long history of noncompliance and admits to missing a week of treatment in March 2017 and then a day last week otherwise states she has been taking regularly. She is no neurologic complaints. She denies any fevers, chills, night sweats, or weight loss. She has no chest pain or shortness of breath. She denies any nausea, vomiting, constipation, or diarrhea. She has no urinary complaints.  REVIEW OF SYSTEMS:   Review of Systems  Constitutional: Negative.   Eyes: Negative.   Respiratory: Negative.   Cardiovascular: Negative.   Gastrointestinal: Negative.   Genitourinary: Negative.   Musculoskeletal: Positive for back pain, joint pain, myalgias and neck pain.  Skin: Negative.   Neurological: Negative.   Psychiatric/Behavioral: Negative.     As per HPI. Otherwise, a complete review of systems is negatve.  ONCOLOGY HISTORY:  No history exists.    PAST MEDICAL HISTORY: Past Medical History:  Diagnosis Date  . CML (chronic myelocytic leukemia) ([place])   . COPD (chronic obstructive pulmonary disease) ([place])   . Depression   . DM type 2 (diabetes mellitus, type 2) ([place])   . Environmental allergies   . History of gastric ulcer   . HLD (hyperlipidemia)   . IDA (iron deficiency anemia)    chronic  . Leukemia, chronic myeloid ([place])   . Osteoarthritis   . Panic disorder   . PSS (progressive systemic sclerosis) ([place])   . Unspecified essential hypertension     PAST SURGICAL HISTORY: Past Surgical History:  Procedure Laterality Date  . back and neck surgery   1999, 2009   had rods placed  . [facility]) No blackages found.   . CARPAL TUNNEL RELEASE    . PARTIAL HYSTERECTOMY     age 27, no cancer    FAMILY HISTORY Family History  Problem Relation Age of Onset  . Diabetes Mother   . Hypertension Mother   . Heart disease Mother   . Prostate cancer Father   . Heart disease Father   . Diabetes Brother     GYNECOLOGIC HISTORY:  No LMP recorded. Patient has had a hysterectomy.     ADVANCED DIRECTIVES:    HEALTH MAINTENANCE: Social History  Substance Use Topics  . Smoking status: Never Smoker  . Smokeless tobacco: Never Used     Comment: quit 1985  . Alcohol use No    Allergies  Allergen Reactions  . Codeine Other (See Comments)    Reaction: Unknown   . Latex Other (See Comments)    Reaction:  Unknown   . Morphine And Related Other (See Comments)    Reaction:  Unknown   . Relafen [Nabumetone] Hives  . Tape Other (See Comments)    Reaction:  Unknown     Current Outpatient Prescriptions  Medication Sig Dispense Refill  . acetaminophen-codeine (TYLENOL #3) 300-30 MG per tablet Take 1 tablet by mouth every 12 (twelve) hours as needed for moderate pain.     Marland Kitchen albuterol (PROVENTIL HFA;VENTOLIN HFA) 108 (90 BASE) MCG/ACT inhaler Inhale 2 puffs  into the lungs every 6 (six) hours as needed for wheezing or shortness of breath.    Marland Kitchen albuterol (PROVENTIL) (2.5 MG/3ML) 0.083% nebulizer solution INHALE CONTENTS OF 1 VIAL VIA NEBULIZER THREE TIMES A DAY prn    . ALPRAZolam (XANAX) 1 MG tablet TAKE 1 TABLET THREE TIMES A DAY 90 tablet 3  . Ascorbic Acid (VITAMIN C) 1000 MG tablet Take 3,000 mg by mouth daily.    . bosutinib (BOSULIF) 100 MG tablet Take 300 mg by mouth daily.     Marland Kitchen buPROPion (WELLBUTRIN SR) 150 MG 12 hr tablet Take 1 tablet (150 mg total) by mouth 2 (two) times daily. 60 tablet 3  . celecoxib (CELEBREX) 200 MG capsule TAKE 1 CAPSULE (200 MG TOTAL) BY MOUTH TWO (2) TIMES A  DAY.    . cetirizine (ZYRTEC) 10 MG tablet Take 20 mg by mouth daily.     . Cholecalciferol (VITAMIN D PO) Take 1 tablet by mouth daily.    Marland Kitchen co-enzyme Q-10 50 MG capsule Take 50 mg by mouth daily.    . cyanocobalamin (,VITAMIN B-12,) 1000 MCG/ML injection Inject 1,000 mcg into the muscle every 30 (thirty) days.    . CYMBALTA 20 MG capsule Take 1 capsule (20 mg total) by mouth 3 (three) times daily. 90 capsule 5  . diphenhydrAMINE (BENADRYL) 25 MG tablet Take 25 mg by mouth every 6 (six) hours as needed for itching or allergies.     . DULoxetine (CYMBALTA) 20 MG capsule Take 1 capsule (20 mg total) by mouth 3 (three) times daily. 90 capsule 5  . estradiol (ESTRACE) 2 MG tablet Take 2 mg by mouth daily.    . fluconazole (DIFLUCAN) 150 MG tablet     . fluticasone (FLONASE) 50 MCG/ACT nasal spray Place 1-2 sprays into both nostrils as needed for rhinitis.     . furosemide (LASIX) 40 MG tablet Take 1 tablet (40 mg total) by mouth daily. 30 tablet 3  . insulin aspart (NOVOLOG) 100 UNIT/ML injection Inject 15 Units into the skin 3 (three) times daily as needed for high blood sugar.     . insulin glargine (LANTUS) 100 UNIT/ML injection Inject 0.37 mLs (37 Units total) into the skin daily. 10 mL 11  . losartan (COZAAR) 100 MG tablet Take 100 mg by mouth daily.    Marland Kitchen MAGNESIUM PO Take 4 tablets by mouth daily.     . montelukast (SINGULAIR) 10 MG tablet TAKE 1 TABLET (10 MG TOTAL) BY MOUTH NIGHTLY.    Marland Kitchen Olopatadine HCl (PATADAY) 0.2 % SOLN Place 1 drop into both eyes daily.    . Omega-3 Fatty Acids (OMEGA 3 PO) Take 3 capsules by mouth daily.    Marland Kitchen omeprazole (PRILOSEC) 40 MG capsule Take by mouth. Reported on 04/21/2015    . pregabalin (LYRICA) 150 MG capsule Take 2 capsules (300 mg total) by mouth at bedtime. 60 capsule 5  . spironolactone (ALDACTONE) 25 MG tablet Take 1 tablet (25 mg total) by mouth 2 (two) times daily. 60 tablet 0  . traZODone (DESYREL) 150 MG tablet Take 1 tablet (150 mg total) by  mouth at bedtime. 30 tablet 5  . vitamin B-12 (CYANOCOBALAMIN) 1000 MCG tablet Take 4,000 mcg by mouth daily.     No current facility-administered medications for this visit.     OBJECTIVE: There were no vitals taken for this visit.   There is no height or weight on file to calculate BMI.    ECOG FS:1 - Symptomatic but  completely ambulatory  General: Well-developed, obese, no acute distress. Eyes: Pink conjunctiva, anicteric sclera. HEENT: Normocephalic, moist mucous membranes, clear oropharnyx. Lungs: Clear to auscultation bilaterally. Heart: Regular rate and rhythm. No rubs, murmurs, or gallops. Abdomen: Soft, nontender, nondistended. No organomegaly noted, normoactive bowel sounds. Musculoskeletal: No edema, cyanosis, or clubbing. Neuro: Alert, answering all questions appropriately. Cranial nerves grossly intact. Skin: No rashes or petechiae noted. Psych: Normal affect.  LAB RESULTS:  No visits with results within 3 Day(s) from this visit.  Latest known visit with results is:  Orders Only on 10/16/2015  Component Date Value Ref Range Status  . WBC 10/16/2015 8.0  3.6 - 11.0 K/uL Final  . RBC 10/16/2015 4.68  3.80 - 5.20 MIL/uL Final  . Hemoglobin 10/16/2015 12.9  12.0 - 16.0 g/dL Final  . HCT 10/16/2015 39.7  35.0 - 47.0 % Final  . MCV 10/16/2015 85.0  80.0 - 100.0 fL Final  . MCH 10/16/2015 27.7  26.0 - 34.0 pg Final  . MCHC 10/16/2015 32.6  32.0 - 36.0 g/dL Final  . RDW 10/16/2015 14.5  11.5 - 14.5 % Final  . Platelets 10/16/2015 238  150 - 440 K/uL Final  . Neutrophils Relative % 10/16/2015 79  % Final  . Neutro Abs 10/16/2015 6.4  1.4 - 6.5 K/uL Final  . Lymphocytes Relative 10/16/2015 14  % Final  . Lymphs Abs 10/16/2015 1.1  1.0 - 3.6 K/uL Final  . Monocytes Relative 10/16/2015 5  % Final  . Monocytes Absolute 10/16/2015 0.4  0.2 - 0.9 K/uL Final  . Eosinophils Relative 10/16/2015 2  % Final  . Eosinophils Absolute 10/16/2015 0.1  0 - 0.7 K/uL Final  . Basophils  Relative 10/16/2015 0  % Final  . Basophils Absolute 10/16/2015 0.0  0 - 0.1 K/uL Final  . Sodium 10/16/2015 134* 135 - 145 mmol/L Final  . Potassium 10/16/2015 4.4  3.5 - 5.1 mmol/L Final  . Chloride 10/16/2015 98* 101 - 111 mmol/L Final  . CO2 10/16/2015 27  22 - 32 mmol/L Final  . Glucose, Bld 10/16/2015 160* 65 - 99 mg/dL Final  . BUN 10/16/2015 23* 6 - 20 mg/dL Final  . Creatinine, Ser 10/16/2015 1.21* 0.44 - 1.00 mg/dL Final  . Calcium 10/16/2015 9.9  8.9 - 10.3 mg/dL Final  . Total Protein 10/16/2015 7.4  6.5 - 8.1 g/dL Final  . Albumin 10/16/2015 3.7  3.5 - 5.0 g/dL Final  . AST 10/16/2015 61* 15 - 41 U/L Final  . ALT 10/16/2015 81* 14 - 54 U/L Final  . Alkaline Phosphatase 10/16/2015 51  38 - 126 U/L Final  . Total Bilirubin 10/16/2015 0.5  0.3 - 1.2 mg/dL Final  . GFR calc non Af Amer 10/16/2015 48* >60 mL/min Final  . GFR calc Af Amer 10/16/2015 55* >60 mL/min Final   Comment: (NOTE) The eGFR has been calculated using the CKD EPI equation. This calculation has not been validated in all clinical situations. eGFR's persistently <60 mL/min signify possible Chronic Kidney Disease.   . Anion gap 10/16/2015 9  5 - 15 Final  . b2a2 transcript 10/21/2015 Comment  % Final   Comment: (NOTE)           <0.001 % (sensitivity limit of assay)   . b3a2 transcript 10/21/2015 Comment  % Final   Comment: (NOTE)           <0.001 % (sensitivity limit of assay)   . E1A2 Transcript 10/21/2015 Comment  % Final  Comment: (NOTE)           <0.001 % (sensitivity limit of assay)   . Interpretation (BCRAL): 10/21/2015 Comment   Final   Comment: (NOTE) The quantitative RT-PCR assay is negative for the b2a2 and b3a2 (p210) and e1a2 (p190) fusion gene transcripts found in chronic myelogenous leukemia and [place] positive acute lymphocytic leukemia. These results do not rule out the presence of low levels of BCR-ABL1 transcript below the level of detection of this assay, or the presence  of rare BCR-ABL1 transcripts not detected by this assay.   . Director Review [facility]): 10/21/2015 Comment   Final   Comment: (NOTE) Constance Goltz, PhD, [facility]               Director, [place] [facility] [place] [facility] [place], [place]   . Background: 10/21/2015 Comment   Corrected   Comment: (NOTE) **The b2a2 and b3a2 fusion transcripts value are titrated to the current International Scale(IS). The standardized baseline is 100% BCR-ABL(IS) and the major molecular response is 0.1% BCR-ABL(IS). The limit of detection is 0.001%.** The BCR-ABL1 translocation is found in virtually all cases of CML and in approximately 15-30% of cases of ALL. This fusion gene is formed by a (9;22)(q34;q11.2) translocation, resulting in the fusion of the breakpoint cluster region (BCR) on chromosome 22 with the Abelson protooncogene (ABL1) from chromosome 9. The derivative chromosome 22 of this translocation is known as the [place] chromosome. In the majority of CML patients, the breakpoint in the BCR gene is located in a 5.8kb region spanning five exons, b1-b5 (exons 12-16), the major breakpoint cluster region (M-bcr). Most commonly, the breakpoint occurs in the introns following b2 or b3, which join with ABL1 exon 2 (a2), forming the fusion transcript b2a2 or b3a2. These c                          himeric transcripts encode a 210kDa BCR-ABL1 fusion protein referred to as p210. In approximately 3% of CML cases, this gene fusion occurs without cytogenetic evidence of the rearrangement. In approximately 75% of [place] positive ALL patients, the breakpoint in the BCR gene is located within intron 1, at the minor breakpoint cluster region (m-bcr). Exon 1 (e1) of BCR joins with the ABL1 a2 (exon 2), forming the fusion transcript e1a2, encoding a 190kDa BCR-ABL1 fusion protein referred to as  p190. The remaining approximately 25% of [place] positive ALL patients are found to be positive for breakpoints within the M-bcr.   . Methodology 10/21/2015 Comment   Corrected   Comment: (NOTE) Total RNA was isolated from the provided specimen, and subjected to RT-PCR. Amplification products were monitored by real-time PCR using primers/probes specific for the e1a2, b2a2 and b3a2 BCR-ABL1 fusion transcripts. An additional RT-PCR amplification of the ABL1 transcript was performed to control for cDNA quantity and quality. Serial dilutions of RNA isolated from a cell line with a known t(9;22) was utilized for quantification of BCR-ABL1 fusion transcripts relative to the ABL1 control gene. Numeric results are reported as % BCR-ABL1/ABL1. The b2a2 and b3a2 fusion transcripts value are titrated to the current  International Scale(IS) using ARQ IS calibrator panel ([employer].). In vitro studies have indicated that this assay has an analytical sensitivity that allows for the detection of approximately 1 cell containing the p190/p210 BCR-ABL1 fusion gene in a background of 100,000 normal cells. All test results should be interpreted in conjunction with clin                          ical and other laboratory findings for the most accurate interpretation. Reference: Anastasia Fiedler and Branford S: Molecular Monitoring of Chronic Myeloid Leukemia. Seminars in Hematology 2003; 40 (2, Suppl 2):62-68. White HE et al. [employer] of the first [employer] Reference Panel for quantitation of BCR-ABL mRNA. Blood 2010;116(22):e111-7. Performed At: [facility] [address] [place], [place] [id number] Nechama Guard MD WU:[id number] Performed At: [facility] [address] [place], [place] [id number] Nechama Guard MD JZ:[id number]     STUDIES: No results found.  ASSESSMENT: # OCT 2011- CML- CHRONIC PHASE [b3a2-60%; Incidental]  NOV 2011- TASIGNA ["fluid over load"]  # DEC 2014-JAN 2015-PROGRESSION [Increase in bcr-abl; ? Non-compliance/SE-hypomag]; START BOSULIF [no Mutation analysis done] [449m]; May 2015- bcr-abl-Undetectable; 2022m[AE-LFTs] SEP 2015- bcr-abl- Increase 0.052%; 10025mnon-compliance] JAN 2016 bcr-abl Increased 5.456%; 300m24mune 2016- bcr-abl <0.001%; STOPPED Bosulif end SEP 2016 [non-compliance];Sep 2016- B3 A2- 0.026% [increasing]; OCT 2016- MUTATION ANALYSIS- not done sec to lack of enough transcripts;  # NOV 1st Re-START BOLSULIF at 300mg20mJAN 2017- 2.44 [RISING]  PLAN:    1. CML: Patient self increased her Bosulif from 300 to 400 mg daily after her last visit in March 2017. BCR-ABL is still pending for today. She will continue with the 400 mg dosing for now and Return to clinic in 3 months with repeat labs one week prior to her office visit. It was previously offered to the patient for a referral for second opinion since her disease is difficult to control, but she has declined. 2. Pain: As per treatment of her primary care Physician.  Patient expressed understanding and was in agreement with this plan. She also understands that She can call clinic at any time with any questions, concerns, or complaints.    TimotLloyd Huger  10/26/2015 11:42 PM

## 2015-10-27 ENCOUNTER — Telehealth: Payer: Self-pay | Admitting: *Deleted

## 2015-10-27 ENCOUNTER — Telehealth: Payer: Self-pay | Admitting: Oncology

## 2015-10-27 ENCOUNTER — Inpatient Hospital Stay: Payer: Medicaid Other | Admitting: Oncology

## 2015-10-27 ENCOUNTER — Inpatient Hospital Stay: Payer: Medicaid Other

## 2015-10-27 NOTE — Telephone Encounter (Signed)
Patient called in to report rash that is all over her body. Patient states she had this type of rash in the past and was treated like a yeast infection. Patient reports itching, benadryl does ease the itching but does not offer relief. I spoke with Dr. Grayland Ormond and patient has been advised to be evaluated at urgent care. Patient states she will try to be seen at urgent care this weekend.

## 2015-10-27 NOTE — Telephone Encounter (Signed)
erroneous

## 2015-10-31 ENCOUNTER — Telehealth: Payer: Self-pay

## 2015-10-31 NOTE — Telephone Encounter (Signed)
pharmacy called stated that they needs a rx with brand medical nessary for cymbalta.  rx needs to have on it that it is medically nessary for patienpharmacy called stated that they needs a rx with brand medical nessary for cymbalta.  rx needs to have on it that it is medically nessary for patient to have name brand.  t to have name brand.

## 2015-11-03 ENCOUNTER — Other Ambulatory Visit: Payer: Self-pay | Admitting: Psychiatry

## 2015-11-03 MED ORDER — CYMBALTA 20 MG PO CPEP: 20.0000 mg | ORAL_CAPSULE | Freq: Three times a day (TID) | ORAL | 5 refills | Status: DC

## 2015-11-03 NOTE — Progress Notes (Signed)
I have tried to update the Cymbalta prescription to being brand name medically necessary.

## 2015-11-03 NOTE — Telephone Encounter (Signed)
I think I have updated it so that it will work. I spoke to the pharmacy.

## 2015-11-16 NOTE — Progress Notes (Signed)
[place]  [phone]  [phone]     Tara Levine DOB: 05-21-1955  MR#: [id number]  [id number]  Patient Care Team: Valera Castle, MD as PCP - General  CHIEF COMPLAINT: CML  INTERVAL HISTORY: Patient returns to clinic for repeat laboratory work and further evaluation. She has missed several appointments in the interim. She has multiple complaints today that are all chronic in nature and unrelated to her CML or treatment. She is once again altered her dose and is now only taking 300 mg Bosulif daily. She has a long history of noncompliance. She has no neurologic complaints. She denies any fevers, chills, night sweats, or weight loss. She has no chest pain or shortness of breath. She denies any nausea, vomiting, constipation, or diarrhea. She has no urinary complaints.  REVIEW OF SYSTEMS:   Review of Systems  Constitutional: Positive for malaise/fatigue. Negative for fever and weight loss.  Eyes: Negative.   Respiratory: Positive for shortness of breath. Negative for cough.   Cardiovascular: Negative.  Negative for chest pain and leg swelling.  Gastrointestinal: Negative.  Negative for abdominal pain.  Genitourinary: Negative.   Musculoskeletal: Positive for back pain, joint pain, myalgias and neck pain.  Skin: Positive for itching and rash.  Neurological: Positive for weakness.  Psychiatric/Behavioral: Negative.     As per HPI. Otherwise, a complete review of systems is negative.  ONCOLOGY HISTORY:  No history exists.    PAST MEDICAL HISTORY: Past Medical History:  Diagnosis Date  . CML (chronic myelocytic leukemia) ([place])   . COPD (chronic obstructive pulmonary disease) ([place])   . Depression   . DM type 2 (diabetes mellitus, type 2) ([place])   . Environmental allergies   . History of gastric ulcer   . HLD (hyperlipidemia)   . IDA (iron deficiency anemia)    chronic  . Leukemia, chronic myeloid ([place])   . Osteoarthritis   .  Panic disorder   . PSS (progressive systemic sclerosis) ([place])   . Unspecified essential hypertension     PAST SURGICAL HISTORY: Past Surgical History:  Procedure Laterality Date  . back and neck surgery  1999, 2009   had rods placed  . [facility]) No blackages found.   . CARPAL TUNNEL RELEASE    . PARTIAL HYSTERECTOMY     age 24, no cancer    FAMILY HISTORY Family History  Problem Relation Age of Onset  . Diabetes Mother   . Hypertension Mother   . Heart disease Mother   . Prostate cancer Father   . Heart disease Father   . Diabetes Brother     GYNECOLOGIC HISTORY:  No LMP recorded. Patient has had a hysterectomy.     ADVANCED DIRECTIVES:    HEALTH MAINTENANCE: Social History  Substance Use Topics  . Smoking status: Never Smoker  . Smokeless tobacco: Never Used     Comment: quit 1985  . Alcohol use No    Allergies  Allergen Reactions  . Codeine Other (See Comments)    Reaction: Unknown   . Latex Other (See Comments)    Reaction:  Unknown   . Morphine And Related Other (See Comments)    Reaction:  Unknown   . Relafen [Nabumetone] Hives  . Tape Other (See Comments)    Reaction:  Unknown     Current Outpatient Prescriptions  Medication Sig Dispense Refill  . acetaminophen-codeine (TYLENOL #3) 300-30 MG per tablet Take 1 tablet by  mouth every 12 (twelve) hours as needed for moderate pain.     Marland Kitchen albuterol (PROVENTIL HFA;VENTOLIN HFA) 108 (90 BASE) MCG/ACT inhaler Inhale 2 puffs into the lungs every 6 (six) hours as needed for wheezing or shortness of breath.    Marland Kitchen albuterol (PROVENTIL) (2.5 MG/3ML) 0.083% nebulizer solution INHALE CONTENTS OF 1 VIAL VIA NEBULIZER THREE TIMES A DAY prn    . ALPRAZolam (XANAX) 1 MG tablet TAKE 1 TABLET THREE TIMES A DAY 90 tablet 3  . Ascorbic Acid (VITAMIN C) 1000 MG tablet Take 3,000 mg by mouth daily.    . bosutinib (BOSULIF) 100 MG tablet Take 300 mg by mouth daily.     Marland Kitchen  buPROPion (WELLBUTRIN SR) 150 MG 12 hr tablet Take 1 tablet (150 mg total) by mouth 2 (two) times daily. 60 tablet 3  . celecoxib (CELEBREX) 200 MG capsule TAKE 1 CAPSULE (200 MG TOTAL) BY MOUTH TWO (2) TIMES A DAY.    . cetirizine (ZYRTEC) 10 MG tablet Take 20 mg by mouth daily.     . Cholecalciferol (VITAMIN D PO) Take 1 tablet by mouth daily.    Marland Kitchen co-enzyme Q-10 50 MG capsule Take 50 mg by mouth daily.    . cyanocobalamin (,VITAMIN B-12,) 1000 MCG/ML injection Inject 1,000 mcg into the muscle every 30 (thirty) days.    . CYMBALTA 20 MG capsule Take 1 capsule (20 mg total) by mouth 3 (three) times daily. Brand name medically necessary 90 capsule 5  . diphenhydrAMINE (BENADRYL) 25 MG tablet Take 25 mg by mouth every 6 (six) hours as needed for itching or allergies.     . DULoxetine (CYMBALTA) 20 MG capsule Take 1 capsule (20 mg total) by mouth 3 (three) times daily. 90 capsule 5  . estradiol (ESTRACE) 2 MG tablet Take 2 mg by mouth daily.    . fluconazole (DIFLUCAN) 150 MG tablet     . fluticasone (FLONASE) 50 MCG/ACT nasal spray Place 1-2 sprays into both nostrils as needed for rhinitis.     . furosemide (LASIX) 40 MG tablet Take 1 tablet (40 mg total) by mouth daily. 30 tablet 3  . insulin aspart (NOVOLOG) 100 UNIT/ML injection Inject 15 Units into the skin 3 (three) times daily as needed for high blood sugar.     . insulin glargine (LANTUS) 100 UNIT/ML injection Inject 0.37 mLs (37 Units total) into the skin daily. 10 mL 11  . losartan (COZAAR) 100 MG tablet Take 100 mg by mouth daily.    Marland Kitchen MAGNESIUM PO Take 4 tablets by mouth daily.     . montelukast (SINGULAIR) 10 MG tablet TAKE 1 TABLET (10 MG TOTAL) BY MOUTH NIGHTLY.    Marland Kitchen Olopatadine HCl (PATADAY) 0.2 % SOLN Place 1 drop into both eyes daily.    . Omega-3 Fatty Acids (OMEGA 3 PO) Take 3 capsules by mouth daily.    Marland Kitchen omeprazole (PRILOSEC) 40 MG capsule Take by mouth. Reported on 04/21/2015    . pregabalin (LYRICA) 150 MG capsule Take 2  capsules (300 mg total) by mouth at bedtime. 60 capsule 5  . spironolactone (ALDACTONE) 25 MG tablet Take 1 tablet (25 mg total) by mouth 2 (two) times daily. 60 tablet 0  . traZODone (DESYREL) 150 MG tablet Take 1 tablet (150 mg total) by mouth at bedtime. 30 tablet 5  . vitamin B-12 (CYANOCOBALAMIN) 1000 MCG tablet Take 4,000 mcg by mouth daily.     No current facility-administered medications for this visit.  OBJECTIVE: BP (!) 147/86   Pulse 86   Temp (!) 96 F (35.6 C) (Tympanic)   Resp 20   Wt (!) 311 lb 8.2 oz (141.3 kg)   BMI 48.79 kg/m    Body mass index is 48.79 kg/m.    ECOG FS:1 - Symptomatic but completely ambulatory  General: Well-developed, obese, no acute distress. Eyes: Pink conjunctiva, anicteric sclera. HEENT: Normocephalic, moist mucous membranes, clear oropharnyx. Lungs: Clear to auscultation bilaterally. Heart: Regular rate and rhythm. No rubs, murmurs, or gallops. Abdomen: Soft, nontender, nondistended. No organomegaly noted, normoactive bowel sounds. Musculoskeletal: No edema, cyanosis, or clubbing. Neuro: Alert, answering all questions appropriately. Cranial nerves grossly intact. Skin: No rashes or petechiae noted. Psych: Normal affect.  LAB RESULTS:  Appointment on 11/17/2015  Component Date Value Ref Range Status  . Magnesium 11/17/2015 1.7  1.7 - 2.4 mg/dL Final    STUDIES: No results found.  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 Non-compliance/SE-hypomag]; START BOSULIF [no Mutation analysis done] [400mg ]; May 2015- bcr-abl-Undetectable; 200mg  [AE-LFTs] SEP 2015- bcr-abl- Increase 0.052%; 100mg  [non-compliance] JAN 2016 bcr-abl Increased 5.456%; 300mg ; June 2016- bcr-abl <0.001%; STOPPED Bosulif end SEP 2016 [non-compliance];Sep 2016- B3 A2- 0.026% [increasing]; OCT 2016- MUTATION ANALYSIS- not done sec to lack of enough  transcripts;  # NOV 1st Re-START BOLSULIF at 300mg /d; JAN 2017- 2.44 [RISING]  PLAN:    1. CML: Patient self decreased her Bosulif from 400 to 300 mg daily.She also admits to discontinue her treatment for several days at a time. Her most recent BCR-ABL is undetectable. Continue current dosing and return to clinic in 3 months for laboratory work and then in 6 months for laboratory work and further evaluation. It was previously offered to the patient for a referral for second opinion since her disease is difficult to control, but she has declined. 2. Pain: As per treatment of her primary care Physician.  Approximately 30 minutes was spent in discussion of which greater than 50% was consultation.  Patient expressed understanding and was in agreement with this plan. She also understands that She can call clinic at any time with any questions, concerns, or complaints.    Lloyd Huger, MD   11/22/2015 8:55 AM

## 2015-11-17 ENCOUNTER — Inpatient Hospital Stay: Payer: Medicaid Other | Attending: Internal Medicine | Admitting: Oncology

## 2015-11-17 ENCOUNTER — Inpatient Hospital Stay: Payer: Medicaid Other

## 2015-11-17 VITALS — BP 147/86 | HR 86 | Temp 96.0°F | Resp 20 | Wt 311.5 lb

## 2015-11-17 DIAGNOSIS — C9210 Chronic myeloid leukemia, BCR/ABL-positive, not having achieved remission: Secondary | ICD-10-CM

## 2015-11-17 DIAGNOSIS — Z79899 Other long term (current) drug therapy: Secondary | ICD-10-CM | POA: Insufficient documentation

## 2015-11-17 DIAGNOSIS — F410 Panic disorder [episodic paroxysmal anxiety] without agoraphobia: Secondary | ICD-10-CM | POA: Diagnosis not present

## 2015-11-17 DIAGNOSIS — I10 Essential (primary) hypertension: Secondary | ICD-10-CM | POA: Insufficient documentation

## 2015-11-17 DIAGNOSIS — J449 Chronic obstructive pulmonary disease, unspecified: Secondary | ICD-10-CM | POA: Insufficient documentation

## 2015-11-17 DIAGNOSIS — E119 Type 2 diabetes mellitus without complications: Secondary | ICD-10-CM | POA: Diagnosis not present

## 2015-11-17 DIAGNOSIS — E785 Hyperlipidemia, unspecified: Secondary | ICD-10-CM | POA: Insufficient documentation

## 2015-11-17 DIAGNOSIS — M1990 Unspecified osteoarthritis, unspecified site: Secondary | ICD-10-CM | POA: Diagnosis not present

## 2015-11-17 LAB — MAGNESIUM: MAGNESIUM: 1.7 mg/dL (ref 1.7–2.4)

## 2015-11-17 NOTE — Progress Notes (Signed)
Patient here today for ongoing follow up regarding CML. Patient reports rash on neck, arms, breasts and folds of legs. Patient states this is an ongoing rash, she has tried OTC creams and sprays as well as fluconazole.

## 2015-11-28 ENCOUNTER — Ambulatory Visit: Payer: Medicaid Other | Admitting: Psychiatry

## 2015-12-04 ENCOUNTER — Other Ambulatory Visit: Payer: Self-pay | Admitting: Psychiatry

## 2015-12-07 ENCOUNTER — Other Ambulatory Visit: Payer: Self-pay | Admitting: Psychiatry

## 2015-12-07 MED ORDER — ALPRAZOLAM 1 MG PO TABS: 1.0000 mg | ORAL_TABLET | Freq: Three times a day (TID) | ORAL | 1 refills | Status: DC

## 2015-12-07 NOTE — Telephone Encounter (Signed)
I phoned in a refill for this plus an additional month for a total of 2 months all together. [facility]

## 2015-12-22 ENCOUNTER — Telehealth: Payer: Self-pay | Admitting: *Deleted

## 2015-12-22 DIAGNOSIS — C9210 Chronic myeloid leukemia, BCR/ABL-positive, not having achieved remission: Secondary | ICD-10-CM

## 2015-12-22 MED ORDER — BOSUTINIB 100 MG PO TABS: 300.0000 mg | ORAL_TABLET | Freq: Every day | ORAL | 5 refills | Status: DC

## 2015-12-22 NOTE — Telephone Encounter (Signed)
Refill for bosulif 300mg  escribed to [facility].

## 2016-01-04 ENCOUNTER — Telehealth: Payer: Self-pay

## 2016-01-04 ENCOUNTER — Other Ambulatory Visit: Payer: Self-pay | Admitting: Psychiatry

## 2016-01-04 NOTE — Telephone Encounter (Signed)
Medication refill request - Patient left a message stating her refill of Lyrica [facility] [place].  Requests a new order be sent to this pharmacy.  No current evaluation scheduled.

## 2016-01-05 ENCOUNTER — Other Ambulatory Visit: Payer: Self-pay | Admitting: Psychiatry

## 2016-01-05 MED ORDER — PREGABALIN 150 MG PO CAPS: 300.0000 mg | ORAL_CAPSULE | Freq: Every day | ORAL | 5 refills | Status: DC

## 2016-01-05 NOTE — Telephone Encounter (Signed)
I phoned into her preferred pharmacy refill with 5 additional refills.

## 2016-02-06 ENCOUNTER — Telehealth: Payer: Self-pay | Admitting: Oncology

## 2016-02-06 NOTE — Telephone Encounter (Signed)
Pt called and said she feels sick and that it could be the flu or a cold. She isn't sure. She also said her side hurts and she feels like her liver might be having problems. She is scheduled for lab work on Friday and I informed pt to please call and r/s if it is determined that she has the flu because we can't risk spreading the flu to other pts. She would like to talk to nurse about the pain in her side that she thinks stems from her liver. Please call her.

## 2016-02-09 ENCOUNTER — Other Ambulatory Visit: Payer: Medicaid Other

## 2016-02-10 NOTE — Progress Notes (Signed)
Follow-up patient with bipolar and PTSD. No new complaints. Affect mildly anxious but reactive. No sign of thought disorder. Tolerating medicine well.  No sign of medicine abuse. Not oversedated. Reviewed appropriate medication usage. Follow-up 3 months.

## 2016-03-01 ENCOUNTER — Other Ambulatory Visit: Payer: Medicaid Other

## 2016-03-04 ENCOUNTER — Other Ambulatory Visit: Payer: Self-pay | Admitting: Psychiatry

## 2016-03-06 ENCOUNTER — Telehealth: Payer: Self-pay

## 2016-03-06 NOTE — Telephone Encounter (Signed)
left message on doctor's line for #18 with no additional refills.

## 2016-03-06 NOTE — Telephone Encounter (Signed)
pt called states she needs xanax  called in . pt was transfered to front desk to make an appt to be seen.  pt has not been seen since july.  pt was told that enought medication would be called in to get to appt.

## 2016-03-08 ENCOUNTER — Inpatient Hospital Stay: Payer: Medicaid Other | Attending: Oncology

## 2016-03-08 DIAGNOSIS — M1990 Unspecified osteoarthritis, unspecified site: Secondary | ICD-10-CM | POA: Insufficient documentation

## 2016-03-08 DIAGNOSIS — I10 Essential (primary) hypertension: Secondary | ICD-10-CM | POA: Diagnosis not present

## 2016-03-08 DIAGNOSIS — C9210 Chronic myeloid leukemia, BCR/ABL-positive, not having achieved remission: Secondary | ICD-10-CM | POA: Diagnosis present

## 2016-03-08 DIAGNOSIS — Z79899 Other long term (current) drug therapy: Secondary | ICD-10-CM | POA: Insufficient documentation

## 2016-03-08 DIAGNOSIS — J449 Chronic obstructive pulmonary disease, unspecified: Secondary | ICD-10-CM | POA: Insufficient documentation

## 2016-03-08 DIAGNOSIS — F410 Panic disorder [episodic paroxysmal anxiety] without agoraphobia: Secondary | ICD-10-CM | POA: Insufficient documentation

## 2016-03-08 DIAGNOSIS — E119 Type 2 diabetes mellitus without complications: Secondary | ICD-10-CM | POA: Insufficient documentation

## 2016-03-08 DIAGNOSIS — E785 Hyperlipidemia, unspecified: Secondary | ICD-10-CM | POA: Diagnosis not present

## 2016-03-08 LAB — CBC WITH DIFFERENTIAL/PLATELET
BASOS ABS: 0 10*3/uL (ref 0–0.1)
BASOS PCT: 1 %
EOS ABS: 0.4 10*3/uL (ref 0–0.7)
Eosinophils Relative: 5 %
HCT: 39.6 % (ref 35.0–47.0)
HEMOGLOBIN: 13.2 g/dL (ref 12.0–16.0)
Lymphocytes Relative: 17 %
Lymphs Abs: 1.3 10*3/uL (ref 1.0–3.6)
MCH: 28.3 pg (ref 26.0–34.0)
MCHC: 33.3 g/dL (ref 32.0–36.0)
MCV: 85.2 fL (ref 80.0–100.0)
Monocytes Absolute: 0.5 10*3/uL (ref 0.2–0.9)
Monocytes Relative: 7 %
Neutro Abs: 5.4 10*3/uL (ref 1.4–6.5)
Neutrophils Relative %: 70 %
Platelets: 270 10*3/uL (ref 150–440)
RBC: 4.65 MIL/uL (ref 3.80–5.20)
RDW: 14.4 % (ref 11.5–14.5)
WBC: 7.7 10*3/uL (ref 3.6–11.0)

## 2016-03-11 ENCOUNTER — Encounter: Payer: Self-pay | Admitting: Psychiatry

## 2016-03-11 ENCOUNTER — Ambulatory Visit (INDEPENDENT_AMBULATORY_CARE_PROVIDER_SITE_OTHER): Payer: Medicaid Other | Admitting: Psychiatry

## 2016-03-11 VITALS — BP 147/87 | HR 112 | Wt 322.2 lb

## 2016-03-11 DIAGNOSIS — F4310 Post-traumatic stress disorder, unspecified: Secondary | ICD-10-CM

## 2016-03-11 DIAGNOSIS — F3130 Bipolar disorder, current episode depressed, mild or moderate severity, unspecified: Secondary | ICD-10-CM | POA: Diagnosis not present

## 2016-03-11 MED ORDER — PREGABALIN 150 MG PO CAPS: 300.0000 mg | ORAL_CAPSULE | Freq: Every day | ORAL | 5 refills | Status: DC

## 2016-03-11 MED ORDER — DULOXETINE HCL 20 MG PO CPEP: 20.0000 mg | ORAL_CAPSULE | Freq: Three times a day (TID) | ORAL | 5 refills | Status: DC

## 2016-03-11 MED ORDER — TRAZODONE HCL 150 MG PO TABS: 150.0000 mg | ORAL_TABLET | Freq: Every day | ORAL | 5 refills | Status: DC

## 2016-03-11 MED ORDER — ALPRAZOLAM 1 MG PO TABS: 1.0000 mg | ORAL_TABLET | Freq: Three times a day (TID) | ORAL | 1 refills | Status: DC

## 2016-03-11 NOTE — Progress Notes (Signed)
Follow-up for this 61 year old woman with chronic pain and multiple medical problems history of PTSD and bipolar disorder. Patient has chronic pain chronic sickness and fatigued. Mood up and down. No real change from previous presentation. She is not having any suicidal thoughts. She denies any psychotic thinking.  Neatly dressed and groomed. Good eye contact. Speech normal. Affect blunted. Mood "up and down". No evidence of thought disorder. Judgment and insight good. No suicidal thinking.  We reviewed her medicine. Not likely that any change in any of that is going to make a benefit at this point. She has gotten to tolerate her current medicine only with some struggle. Supportive counseling and review of medicine. Renew prescriptions. Follow-up 6 months.

## 2016-03-14 LAB — BCR-ABL1, CML/ALL, PCR, QUANT

## 2016-05-17 ENCOUNTER — Telehealth: Payer: Self-pay

## 2016-05-17 ENCOUNTER — Inpatient Hospital Stay: Payer: Medicaid Other | Attending: Oncology

## 2016-05-17 ENCOUNTER — Other Ambulatory Visit: Payer: Self-pay

## 2016-05-17 ENCOUNTER — Emergency Department: Payer: Medicaid Other

## 2016-05-17 ENCOUNTER — Encounter: Payer: Self-pay | Admitting: Emergency Medicine

## 2016-05-17 ENCOUNTER — Emergency Department
Admission: EM | Admit: 2016-05-17 | Discharge: 2016-05-17 | Disposition: A | Discharge status: Home / Self Care | Payer: Medicaid Other | Attending: Emergency Medicine | Admitting: Emergency Medicine

## 2016-05-17 DIAGNOSIS — M1990 Unspecified osteoarthritis, unspecified site: Secondary | ICD-10-CM | POA: Insufficient documentation

## 2016-05-17 DIAGNOSIS — J449 Chronic obstructive pulmonary disease, unspecified: Secondary | ICD-10-CM | POA: Diagnosis not present

## 2016-05-17 DIAGNOSIS — I110 Hypertensive heart disease with heart failure: Secondary | ICD-10-CM | POA: Diagnosis not present

## 2016-05-17 DIAGNOSIS — E119 Type 2 diabetes mellitus without complications: Secondary | ICD-10-CM | POA: Insufficient documentation

## 2016-05-17 DIAGNOSIS — Z794 Long term (current) use of insulin: Secondary | ICD-10-CM | POA: Insufficient documentation

## 2016-05-17 DIAGNOSIS — E785 Hyperlipidemia, unspecified: Secondary | ICD-10-CM | POA: Diagnosis not present

## 2016-05-17 DIAGNOSIS — Z79899 Other long term (current) drug therapy: Secondary | ICD-10-CM | POA: Diagnosis not present

## 2016-05-17 DIAGNOSIS — R0789 Other chest pain: Secondary | ICD-10-CM | POA: Insufficient documentation

## 2016-05-17 DIAGNOSIS — I10 Essential (primary) hypertension: Secondary | ICD-10-CM | POA: Insufficient documentation

## 2016-05-17 DIAGNOSIS — F410 Panic disorder [episodic paroxysmal anxiety] without agoraphobia: Secondary | ICD-10-CM | POA: Diagnosis not present

## 2016-05-17 DIAGNOSIS — R059 Cough, unspecified: Secondary | ICD-10-CM

## 2016-05-17 DIAGNOSIS — R05 Cough: Secondary | ICD-10-CM | POA: Insufficient documentation

## 2016-05-17 DIAGNOSIS — I5032 Chronic diastolic (congestive) heart failure: Secondary | ICD-10-CM | POA: Diagnosis not present

## 2016-05-17 DIAGNOSIS — C9210 Chronic myeloid leukemia, BCR/ABL-positive, not having achieved remission: Secondary | ICD-10-CM | POA: Diagnosis present

## 2016-05-17 LAB — CBC
HCT: 36 % (ref 35.0–47.0)
Hemoglobin: 12.1 g/dL (ref 12.0–16.0)
MCH: 28.1 pg (ref 26.0–34.0)
MCHC: 33.5 g/dL (ref 32.0–36.0)
MCV: 83.9 fL (ref 80.0–100.0)
PLATELETS: 260 10*3/uL (ref 150–440)
RBC: 4.3 MIL/uL (ref 3.80–5.20)
RDW: 14.3 % (ref 11.5–14.5)
WBC: 6.5 10*3/uL (ref 3.6–11.0)

## 2016-05-17 LAB — CBC WITH DIFFERENTIAL/PLATELET
Basophils Absolute: 0 10*3/uL (ref 0–0.1)
Basophils Relative: 0 %
EOS ABS: 0.2 10*3/uL (ref 0–0.7)
EOS PCT: 3 %
HCT: 37.4 % (ref 35.0–47.0)
HEMOGLOBIN: 12.2 g/dL (ref 12.0–16.0)
LYMPHS ABS: 1 10*3/uL (ref 1.0–3.6)
Lymphocytes Relative: 18 %
MCH: 27.7 pg (ref 26.0–34.0)
MCHC: 32.5 g/dL (ref 32.0–36.0)
MCV: 85 fL (ref 80.0–100.0)
MONOS PCT: 8 %
Monocytes Absolute: 0.5 10*3/uL (ref 0.2–0.9)
Neutro Abs: 4.2 10*3/uL (ref 1.4–6.5)
Neutrophils Relative %: 71 %
PLATELETS: 253 10*3/uL (ref 150–440)
RBC: 4.4 MIL/uL (ref 3.80–5.20)
RDW: 14.9 % — ABNORMAL HIGH (ref 11.5–14.5)
WBC: 5.9 10*3/uL (ref 3.6–11.0)

## 2016-05-17 LAB — BASIC METABOLIC PANEL
Anion gap: 8 (ref 5–15)
BUN: 16 mg/dL (ref 6–20)
CALCIUM: 9.2 mg/dL (ref 8.9–10.3)
CHLORIDE: 100 mmol/L — AB (ref 101–111)
CO2: 28 mmol/L (ref 22–32)
CREATININE: 0.92 mg/dL (ref 0.44–1.00)
GFR calc Af Amer: >60 mL/min (ref >60)
GFR calc non Af Amer: >60 mL/min (ref >60)
Glucose, Bld: 166 mg/dL — ABNORMAL HIGH (ref 65–99)
Potassium: 4.1 mmol/L (ref 3.5–5.1)
Sodium: 136 mmol/L (ref 135–145)

## 2016-05-17 LAB — HEPATIC FUNCTION PANEL
ALK PHOS: 58 U/L (ref 38–126)
ALT: 72 U/L — ABNORMAL HIGH (ref 14–54)
AST: 64 U/L — ABNORMAL HIGH (ref 15–41)
Albumin: 3.4 g/dL — ABNORMAL LOW (ref 3.5–5.0)
BILIRUBIN TOTAL: 0.4 mg/dL (ref 0.3–1.2)
Bilirubin, Direct: <0.1 mg/dL — ABNORMAL LOW (ref 0.1–0.5)
Total Protein: 6.9 g/dL (ref 6.5–8.1)

## 2016-05-17 LAB — TROPONIN I: Troponin I: <0.03 ng/mL (ref <0.03)

## 2016-05-17 LAB — BRAIN NATRIURETIC PEPTIDE: B Natriuretic Peptide: 21 pg/mL (ref 0.0–100.0)

## 2016-05-17 MED ORDER — HYDROCOD POLST-CPM POLST ER 10-8 MG/5ML PO SUER
5.0000 mL | Freq: Once | ORAL | Status: AC
  Administered 2016-05-17: 5 mL via ORAL
  Filled 2016-05-17: qty 5

## 2016-05-17 MED ORDER — HYDROCOD POLST-CPM POLST ER 10-8 MG/5ML PO SUER: 5.0000 mL | Freq: Two times a day (BID) | ORAL | 0 refills | Status: DC

## 2016-05-17 NOTE — ED Provider Notes (Signed)
[facility] Emergency Department Provider Note        Time seen: ----------------------------------------- 4:23 PM on 05/17/2016 -----------------------------------------    I have reviewed the triage vital signs and the nursing notes.   HISTORY   Chief Complaint Chest Pain    HPI Tara Levine is a 61 y.o. female who presents to the ED for multiple complaints. Patient states one week ago she had a heavy feeling in her chest that radiated into her left shoulder blade. She says shortness of breath, nausea, dizziness and severe persistent cough with sputum production. She takes oral chemotherapy. She also complains of headache. She denies fevers, chills or other complaints at this time.   Past Medical History:  Diagnosis Date  . CML (chronic myelocytic leukemia) ([place])   . COPD (chronic obstructive pulmonary disease) ([place])   . Depression   . DM type 2 (diabetes mellitus, type 2) ([place])   . Environmental allergies   . History of gastric ulcer   . HLD (hyperlipidemia)   . IDA (iron deficiency anemia)    chronic  . Leukemia, chronic myeloid ([place])   . Osteoarthritis   . Panic disorder   . PSS (progressive systemic sclerosis) ([place])   . Unspecified essential hypertension     Patient Active Problem List   Diagnosis Date Noted  . Major depression, recurrent, chronic ([place]) 08/03/2015  . Bilateral pneumonia 09/07/2014  . OSA (obstructive sleep apnea) 09/07/2014  . Obesity hypoventilation syndrome ([place]) 09/07/2014  . Hyponatremia 09/07/2014  . Acute on chronic renal failure ([place]) 09/07/2014  . Morbid obesity ([place]) 09/07/2014  . Hyperkalemia 09/07/2014  . Spinal stenosis, 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  . Chronic back pain 09/07/2014  . Diaper candidiasis 09/07/2014  . Constipation 09/07/2014  . Acute respiratory failure with hypoxia ([place]) 08/31/2014  . Chronic diastolic CHF (congestive heart failure) ([place]) 05/18/2014  . Acute bronchitis 04/05/2014  . Severe  obesity (BMI >= 40) ([place]) 02/04/2014  . Cough 01/31/2014  . Snoring 01/31/2014  . SOB (shortness of breath) 01/31/2014  . Post-nasal drip 01/31/2014  . Clinical depression 11/19/2013  . Blood in sputum 10/19/2013  . Cephalalgia 09/24/2012  . Diastolic heart failure ([place]) 09/08/2012  . Essential (primary) hypertension 09/08/2012  . Arthritis, degenerative 09/08/2012  . Diverticulitis 07/31/2012  . Fibromyalgia 07/31/2012  . PNA (pneumonia) 07/30/2012  . Chronic generalized pain 06/26/2012  . Diabetes mellitus ([place]) 06/26/2012  . Gastric catarrh 06/26/2012  . BP (high blood pressure) 06/26/2012  . Cellulitis and abscess of trunk 03/26/2012  . Cervical pain 02/17/2012  . LBP (low back pain) 02/17/2012  . Hypercholesterolemia 05/27/2011  . Abnormal EKG 04/02/2011  . Chest tightness 04/02/2011  . Chronic pain 04/02/2011  . HTN (hypertension) 04/02/2011  . Anxiety and depression 04/02/2011  . Back ache 01/15/2011  . Chronic myeloid leukemia ([place]) 01/03/2011    Past Surgical History:  Procedure Laterality Date  . back and neck surgery  1999, 2009   had rods placed  . [facility]) No blackages found.   . CARPAL TUNNEL RELEASE    . PARTIAL HYSTERECTOMY     age 64, no cancer    Allergies Morphine; Codeine; Latex; Morphine and related; Relafen [nabumetone]; and Tape  Social History Social History  Substance Use Topics  . Smoking status: Never Smoker  . Smokeless tobacco: Never Used     Comment: quit 1985  . Alcohol use No    Review of Systems Constitutional: Negative for fever.  Cardiovascular: Positive for chest pain Respiratory: Negative for shortness of breath. Positive for persistent productive cough Gastrointestinal: Negative for abdominal pain, vomiting and diarrhea. Positive for nausea Genitourinary: Negative for dysuria. Musculoskeletal: Negative for back pain. Skin: Negative for rash. Neurological: Positive  for headache  10-point ROS otherwise negative.  ____________________________________________   PHYSICAL EXAM:  VITAL SIGNS: ED Triage Vitals  Enc Vitals Group     BP 05/17/16 1440 114/72     Pulse Rate 05/17/16 1440 75     Resp 05/17/16 1440 16     Temp 05/17/16 1440 98.6 F (37 C)     Temp Source 05/17/16 1440 Oral     SpO2 05/17/16 1440 95 %     Weight --      Height --      Head Circumference --      Peak Flow --      Pain Score 05/17/16 1441 7     Pain Loc --      Pain Edu? --      Excl. in [place]? --     Constitutional: Alert and oriented. Well appearing and in no distress. Morbidly obese Eyes: Conjunctivae are normal. PERRL. Normal extraocular movements. ENT   Head: Normocephalic and atraumatic.   Nose: No congestion/rhinnorhea.   Mouth/Throat: Mucous membranes are moist.   Neck: No stridor. Cardiovascular: Normal rate, regular rhythm. No murmurs, rubs, or gallops. Respiratory: Normal respiratory effort without tachypnea nor retractions. Breath sounds are clear and equal bilaterally. No wheezes/rales/rhonchi. Gastrointestinal: Soft and nontender. Normal bowel sounds Musculoskeletal: Nontender with normal range of motion in extremities. No lower extremity tenderness nor edema. Neurologic:  Normal speech and language. No gross focal neurologic deficits are appreciated.  Skin:  Skin is warm, dry and intact. No rash noted. Psychiatric: Mood and affect are normal. Speech and behavior are normal.  ____________________________________________  EKG: Interpreted by me. Sinus rhythm rate of 76 bpm, normal PR interval, normal QRS, normal QT, left axis deviation. EKG is unchanged from prior.  ____________________________________________  ED COURSE:  Pertinent labs & imaging results that were available during my care of the patient were reviewed by me and considered in my medical decision making (see chart for details). Patient presents for multiple complaints, we  will assess with labs and imaging if needed. Clinical Course as of May 18 1642  Fri May 17, 2016  1635 Patient low risk for PE according to well's criteria  [JW]    Clinical Course User Index [JW] Earleen Newport, MD   Procedures ____________________________________________   LABS (pertinent positives/negatives)  Labs Reviewed  BASIC METABOLIC PANEL - Abnormal; Notable for the following:       Result Value   Chloride 100 (*)    Glucose, Bld 166 (*)    All other components within normal limits  HEPATIC FUNCTION PANEL - Abnormal; Notable for the following:    Albumin 3.4 (*)    AST 64 (*)    ALT 72 (*)    Bilirubin, Direct <0.1 (*)    All other components within normal limits  CBC  TROPONIN I  BRAIN NATRIURETIC PEPTIDE    RADIOLOGY Chest x-ray IMPRESSION: No active cardiopulmonary disease. ____________________________________________  FINAL ASSESSMENT AND PLAN  Chest pain, cough  Plan: Patient's labs and imaging were dictated above. Patient had presented for Multiple symptoms but labs are reassuring. Repeat troponin and BNP were within normal limits. She is low risk according to well's criteria. She'll be given cough medication and encouraged to have  close outpatient follow-up.   Earleen Newport, MD   Note: This note was generated in part or whole with voice recognition software. Voice recognition is usually quite accurate but there are transcription errors that can and very often do occur. I apologize for any typographical errors that were not detected and corrected.     Earleen Newport, MD 05/17/16 [id number]

## 2016-05-17 NOTE — ED Triage Notes (Signed)
Pt states that about 1 week ago she had a very heavy feeling in her central chest that radiated into the left shoulder blade. Pt states that she has had shortness of breath, nausea, dizziness, and coughing with yellow/green sputum. Pt is cancer pt. Pt takes oral chemotherapy. Pt in NAD in triage, breathing is equal and unlabored.

## 2016-05-17 NOTE — ED Notes (Signed)
Pt verbalized understanding of discharge instructions. NAD at this time. 

## 2016-05-17 NOTE — Telephone Encounter (Signed)
Patient presented in clinic today for lab work. She mentioned to Lab tech that she was having Chest pain and SOB. Patient wanted to speak with clinical staff. Went out and interviewed patient asking if any jaw pain, left arm pain? She states "last week I had shortness of breath, chest pain and jaw pain I think I may have had a small heart attack". Advised she needed to go to the UC or ER. She refused ER  But after talking with patient and reassuring her that she will be well taken care of in the ER she was taken by her husband by car to the ER. I called and spoke with Nira Conn let her know we are sending patient to ER for the SOB,chest pain they were going to watch out for her arrival.

## 2016-05-21 LAB — BCR-ABL1, CML/ALL, PCR, QUANT

## 2016-05-21 NOTE — Progress Notes (Deleted)
[place]  [phone]) [phone]  Fax:(336) [place] DOB: 07-14-1955  MR#: [id number]  JYN#:[id number]  Patient Care Team: [place] as PCP - General  CHIEF COMPLAINT: CML  INTERVAL HISTORY: Patient returns to clinic for repeat laboratory work and further evaluation. She has missed several appointments in the interim. She has multiple complaints today that are all chronic in nature and unrelated to her CML or treatment. She is once again altered her dose and is now only taking 300 mg Bosulif daily. She has a long history of noncompliance. She has no neurologic complaints. She denies any fevers, chills, night sweats, or weight loss. She has no chest pain or shortness of breath. She denies any nausea, vomiting, constipation, or diarrhea. She has no urinary complaints.  REVIEW OF SYSTEMS:   Review of Systems  Constitutional: Positive for malaise/fatigue. Negative for fever and weight loss.  Eyes: Negative.   Respiratory: Positive for shortness of breath. Negative for cough.   Cardiovascular: Negative.  Negative for chest pain and leg swelling.  Gastrointestinal: Negative.  Negative for abdominal pain.  Genitourinary: Negative.   Musculoskeletal: Positive for back pain, joint pain, myalgias and neck pain.  Skin: Positive for itching and rash.  Neurological: Positive for weakness.  Psychiatric/Behavioral: Negative.     As per HPI. Otherwise, a complete review of systems is negative.  ONCOLOGY HISTORY:  No history exists.    PAST MEDICAL HISTORY: Past Medical History:  Diagnosis Date  . CML (chronic myelocytic leukemia) ([place])   . COPD (chronic obstructive pulmonary disease) ([place])   . Depression   . DM type 2 (diabetes mellitus, type 2) ([place])   . Environmental allergies   . History of gastric ulcer   . HLD (hyperlipidemia)   . IDA (iron deficiency anemia)    chronic  . Leukemia, chronic myeloid ([place])   . Osteoarthritis   .  Panic disorder   . PSS (progressive systemic sclerosis) ([place])   . Unspecified essential hypertension     PAST SURGICAL HISTORY: Past Surgical History:  Procedure Laterality Date  . back and neck surgery  1999, 2009   had rods placed  . [place] [facility]) No blackages found.   . CARPAL TUNNEL RELEASE    . PARTIAL HYSTERECTOMY     age 61, no cancer    FAMILY HISTORY Family History  Problem Relation Age of Onset  . Diabetes Mother   . Hypertension Mother   . Heart disease Mother   . Prostate cancer Father   . Heart disease Father   . Diabetes Brother     GYNECOLOGIC HISTORY:  No LMP recorded. Patient has had a hysterectomy.     ADVANCED DIRECTIVES:    HEALTH MAINTENANCE: Social History  Substance Use Topics  . Smoking status: Never Smoker  . Smokeless tobacco: Never Used     Comment: quit 1985  . Alcohol use No    Allergies  Allergen Reactions  . Morphine Rash    Headache too  . Codeine Other (See Comments)    Reaction: Unknown   . Latex Other (See Comments)    Reaction:  Unknown   . Morphine And Related Other (See Comments)    Reaction:  Unknown   . Relafen [Nabumetone] Hives  . Tape Other (See Comments) and Rash    Reaction:  Unknown     Current Outpatient Prescriptions  Medication Sig Dispense Refill  .  acetaminophen-codeine (TYLENOL #3) 300-30 MG per tablet Take 1 tablet by mouth every 12 (twelve) hours as needed for moderate pain.     Marland Kitchen albuterol (PROVENTIL HFA;VENTOLIN HFA) 108 (90 BASE) MCG/ACT inhaler Inhale 2 puffs into the lungs every 6 (six) hours as needed for wheezing or shortness of breath.    Marland Kitchen albuterol (PROVENTIL) (2.5 MG/3ML) 0.083% nebulizer solution INHALE CONTENTS OF 1 VIAL VIA NEBULIZER THREE TIMES A DAY prn    . ALPRAZolam (XANAX) 1 MG tablet Take 1 tablet (1 mg total) by mouth 3 (three) times daily. 90 tablet 1  . Ascorbic Acid (VITAMIN C) 1000 MG tablet Take 3,000 mg by mouth daily.    .  bosutinib (BOSULIF) 100 MG tablet Take 3 tablets (300 mg total) by mouth daily. 90 tablet 5  . celecoxib (CELEBREX) 200 MG capsule TAKE 1 CAPSULE (200 MG TOTAL) BY MOUTH TWO (2) TIMES A DAY.    . cetirizine (ZYRTEC) 10 MG tablet Take 20 mg by mouth daily.     . chlorpheniramine-HYDROcodone (TUSSIONEX PENNKINETIC ER) 10-8 MG/5ML SUER Take 5 mLs by mouth 2 (two) times daily. 140 mL 0  . Cholecalciferol (VITAMIN D PO) Take 1 tablet by mouth daily.    Marland Kitchen co-enzyme Q-10 50 MG capsule Take 50 mg by mouth daily.    . cyanocobalamin (,VITAMIN B-12,) 1000 MCG/ML injection Inject 1,000 mcg into the muscle every 30 (thirty) days.    . diphenhydrAMINE (BENADRYL) 25 MG tablet Take 25 mg by mouth every 6 (six) hours as needed for itching or allergies.     . DULoxetine (CYMBALTA) 20 MG capsule Take 1 capsule (20 mg total) by mouth 3 (three) times daily. 90 capsule 5  . estradiol (ESTRACE) 2 MG tablet Take 2 mg by mouth daily.    . fluconazole (DIFLUCAN) 150 MG tablet     . fluticasone (FLONASE) 50 MCG/ACT nasal spray Place 1-2 sprays into both nostrils as needed for rhinitis.     . furosemide (LASIX) 40 MG tablet Take 1 tablet (40 mg total) by mouth daily. 30 tablet 3  . insulin aspart (NOVOLOG) 100 UNIT/ML injection Inject 15 Units into the skin 3 (three) times daily as needed for high blood sugar.     . insulin glargine (LANTUS) 100 UNIT/ML injection Inject 0.37 mLs (37 Units total) into the skin daily. 10 mL 11  . losartan (COZAAR) 100 MG tablet Take 100 mg by mouth daily.    Marland Kitchen MAGNESIUM PO Take 4 tablets by mouth daily.     . montelukast (SINGULAIR) 10 MG tablet TAKE 1 TABLET (10 MG TOTAL) BY MOUTH NIGHTLY.    Marland Kitchen Olopatadine HCl (PATADAY) 0.2 % SOLN Place 1 drop into both eyes daily.    . Omega-3 Fatty Acids (OMEGA 3 PO) Take 3 capsules by mouth daily.    Marland Kitchen omeprazole (PRILOSEC) 40 MG capsule Take by mouth. Reported on 04/21/2015    . pregabalin (LYRICA) 150 MG capsule Take 2 capsules (300 mg total) by mouth  at bedtime. 60 capsule 5  . spironolactone (ALDACTONE) 25 MG tablet Take 1 tablet (25 mg total) by mouth 2 (two) times daily. 60 tablet 0  . traZODone (DESYREL) 150 MG tablet Take 1 tablet (150 mg total) by mouth at bedtime. 30 tablet 5  . vitamin B-12 (CYANOCOBALAMIN) 1000 MCG tablet Take 4,000 mcg by mouth daily.     No current facility-administered medications for this visit.     OBJECTIVE: There were no vitals taken for this visit.  There is no height or weight on file to calculate BMI.    ECOG FS:1 - Symptomatic but completely ambulatory  General: Well-developed, obese, no acute distress. Eyes: Pink conjunctiva, anicteric sclera. HEENT: Normocephalic, moist mucous membranes, clear oropharnyx. Lungs: Clear to auscultation bilaterally. Heart: Regular rate and rhythm. No rubs, murmurs, or gallops. Abdomen: Soft, nontender, nondistended. No organomegaly noted, normoactive bowel sounds. Musculoskeletal: No edema, cyanosis, or clubbing. Neuro: Alert, answering all questions appropriately. Cranial nerves grossly intact. Skin: No rashes or petechiae noted. Psych: Normal affect.  LAB RESULTS:  No visits with results within 3 Day(s) from this visit.  Latest known visit with results is:  Admission on 05/17/2016, Discharged on 05/17/2016  Component Date Value Ref Range Status  . Sodium 05/17/2016 136  135 - 145 mmol/L Final  . Potassium 05/17/2016 4.1  3.5 - 5.1 mmol/L Final  . Chloride 05/17/2016 100* 101 - 111 mmol/L Final  . CO2 05/17/2016 28  22 - 32 mmol/L Final  . Glucose, Bld 05/17/2016 166* 65 - 99 mg/dL Final  . BUN 05/17/2016 16  6 - 20 mg/dL Final  . Creatinine, Ser 05/17/2016 0.92  0.44 - 1.00 mg/dL Final  . Calcium 05/17/2016 9.2  8.9 - 10.3 mg/dL Final  . GFR calc non Af Amer 05/17/2016 >60  >60 mL/min Final  . GFR calc Af Amer 05/17/2016 >60  >60 mL/min Final   Comment: (NOTE) The eGFR has been calculated using the CKD EPI equation. This calculation has not been  validated in all clinical situations. eGFR's persistently <60 mL/min signify possible Chronic Kidney Disease.   . Anion gap 05/17/2016 8  5 - 15 Final  . WBC 05/17/2016 6.5  3.6 - 11.0 K/uL Final  . RBC 05/17/2016 4.30  3.80 - 5.20 MIL/uL Final  . Hemoglobin 05/17/2016 12.1  12.0 - 16.0 g/dL Final  . HCT 05/17/2016 36.0  35.0 - 47.0 % Final  . MCV 05/17/2016 83.9  80.0 - 100.0 fL Final  . MCH 05/17/2016 28.1  26.0 - 34.0 pg Final  . MCHC 05/17/2016 33.5  32.0 - 36.0 g/dL Final  . RDW 05/17/2016 14.3  11.5 - 14.5 % Final  . Platelets 05/17/2016 260  150 - 440 K/uL Final  . Troponin I 05/17/2016 <0.03  <0.03 ng/mL Final  . Total Protein 05/17/2016 6.9  6.5 - 8.1 g/dL Final  . Albumin 05/17/2016 3.4* 3.5 - 5.0 g/dL Final  . AST 05/17/2016 64* 15 - 41 U/L Final  . ALT 05/17/2016 72* 14 - 54 U/L Final  . Alkaline Phosphatase 05/17/2016 58  38 - 126 U/L Final  . Total Bilirubin 05/17/2016 0.4  0.3 - 1.2 mg/dL Final  . Bilirubin, Direct 05/17/2016 <0.1* 0.1 - 0.5 mg/dL Final  . Indirect Bilirubin 05/17/2016 NOT CALCULATED  0.3 - 0.9 mg/dL Final  . B Natriuretic Peptide 05/17/2016 21.0  0.0 - 100.0 pg/mL Final    STUDIES: No results found.  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 Non-compliance/SE-hypomag]; START BOSULIF [no Mutation analysis done] [481m]; May 2015- bcr-abl-Undetectable; 2033m[AE-LFTs] SEP 2015- bcr-abl- Increase 0.052%; 10063mnon-compliance] JAN 2016 bcr-abl Increased 5.456%; 300m54mune 2016- bcr-abl <0.001%; STOPPED Bosulif end SEP 2016 [non-compliance];Sep 2016- B3 A2- 0.026% [increasing]; OCT 2016- MUTATION ANALYSIS- not done sec to lack of enough transcripts;  # NOV 1st Re-START BOLSULIF at 300mg75mJAN 2017- 2.44 [[place]]  PLAN:    1. CML:  Patient self decreased her Bosulif from 400 to 300 mg daily.She also admits to discontinue her  treatment for several days at a time. Her most recent BCR-ABL is undetectable. Continue current dosing and return to clinic in 3 months for laboratory work and then in 6 months for laboratory work and further evaluation. It was previously offered to the patient for a referral for second opinion since her disease is difficult to control, but she has declined. 2. Pain: As per treatment of her primary care Physician.  Approximately 30 minutes was spent in discussion of which greater than 50% was consultation.  Patient expressed understanding and was in agreement with this plan. She also understands that She can call clinic at any time with any questions, concerns, or complaints.    Lloyd Huger, MD   05/21/2016 11:44 PM

## 2016-05-24 ENCOUNTER — Inpatient Hospital Stay: Payer: Medicaid Other | Admitting: Oncology

## 2016-05-24 ENCOUNTER — Ambulatory Visit: Payer: Medicaid Other | Admitting: Oncology

## 2016-05-27 ENCOUNTER — Other Ambulatory Visit: Payer: Self-pay | Admitting: *Deleted

## 2016-05-27 NOTE — Telephone Encounter (Signed)
Asked pharmacy to send request to PCP as she was not on med since June 2017 and the hospital told her to restart it

## 2016-05-31 ENCOUNTER — Inpatient Hospital Stay: Payer: Medicaid Other | Attending: Oncology | Admitting: Oncology

## 2016-05-31 VITALS — BP 131/83 | HR 89 | Temp 97.2°F | Resp 18 | Wt 325.3 lb

## 2016-05-31 DIAGNOSIS — Z9114 Patient's other noncompliance with medication regimen: Secondary | ICD-10-CM | POA: Diagnosis not present

## 2016-05-31 DIAGNOSIS — Z8719 Personal history of other diseases of the digestive system: Secondary | ICD-10-CM | POA: Insufficient documentation

## 2016-05-31 DIAGNOSIS — E785 Hyperlipidemia, unspecified: Secondary | ICD-10-CM | POA: Diagnosis not present

## 2016-05-31 DIAGNOSIS — Z794 Long term (current) use of insulin: Secondary | ICD-10-CM | POA: Diagnosis not present

## 2016-05-31 DIAGNOSIS — M1990 Unspecified osteoarthritis, unspecified site: Secondary | ICD-10-CM | POA: Diagnosis not present

## 2016-05-31 DIAGNOSIS — C9210 Chronic myeloid leukemia, BCR/ABL-positive, not having achieved remission: Secondary | ICD-10-CM | POA: Diagnosis not present

## 2016-05-31 DIAGNOSIS — E119 Type 2 diabetes mellitus without complications: Secondary | ICD-10-CM | POA: Diagnosis not present

## 2016-05-31 DIAGNOSIS — J449 Chronic obstructive pulmonary disease, unspecified: Secondary | ICD-10-CM | POA: Diagnosis not present

## 2016-05-31 DIAGNOSIS — I10 Essential (primary) hypertension: Secondary | ICD-10-CM | POA: Diagnosis not present

## 2016-05-31 DIAGNOSIS — Z79899 Other long term (current) drug therapy: Secondary | ICD-10-CM | POA: Insufficient documentation

## 2016-05-31 DIAGNOSIS — Z9119 Patient's noncompliance with other medical treatment and regimen: Secondary | ICD-10-CM | POA: Diagnosis not present

## 2016-05-31 DIAGNOSIS — F329 Major depressive disorder, single episode, unspecified: Secondary | ICD-10-CM | POA: Diagnosis not present

## 2016-05-31 MED ORDER — SPIRONOLACTONE 25 MG PO TABS: 25.0000 mg | ORAL_TABLET | Freq: Two times a day (BID) | ORAL | 5 refills | Status: DC

## 2016-05-31 NOTE — Progress Notes (Signed)
[place]  Telephone:(336) [phone]  Fax:(336) [place] DOB: 01-Jul-1955  MR#: [id number]  DVV#:[id number]  Patient Care Team: [place] as PCP - General  CHIEF COMPLAINT: CML  INTERVAL HISTORY: Patient returns to clinic for repeat laboratory work and further evaluation. She has missed several appointments in the interim. She has multiple complaints today that are all chronic in nature and unrelated to her CML or treatment. Patient states she is currently taking 400 mg Bosulif daily, but she changes her dose and stops it altogether then restarts without medical input. She has a long history of noncompliance. She has no neurologic complaints. She denies any fevers, chills, night sweats, or weight loss. She has no chest pain or shortness of breath. She denies any nausea, vomiting, constipation, or diarrhea. She has no urinary complaints.  REVIEW OF SYSTEMS:   Review of Systems  Constitutional: Positive for malaise/fatigue. Negative for fever and weight loss.  Eyes: Negative.   Respiratory: Positive for shortness of breath. Negative for cough.   Cardiovascular: Negative.  Negative for chest pain and leg swelling.  Gastrointestinal: Negative.  Negative for abdominal pain.  Genitourinary: Negative.   Musculoskeletal: Positive for back pain, joint pain, myalgias and neck pain.  Skin: Positive for itching and rash.  Neurological: Positive for weakness.  Psychiatric/Behavioral: Negative.     As per HPI. Otherwise, a complete review of systems is negative.  ONCOLOGY HISTORY:  No history exists.    PAST MEDICAL HISTORY: Past Medical History:  Diagnosis Date  . CML (chronic myelocytic leukemia) ([place])   . COPD (chronic obstructive pulmonary disease) ([place])   . Depression   . DM type 2 (diabetes mellitus, type 2) ([place])   . Environmental allergies   . History of gastric ulcer   . HLD (hyperlipidemia)   . IDA (iron deficiency anemia)    chronic  . Leukemia, chronic myeloid ([place])   . Osteoarthritis   . Panic disorder   . PSS (progressive systemic sclerosis) ([place])   . Unspecified essential hypertension     PAST SURGICAL HISTORY: Past Surgical History:  Procedure Laterality Date  . back and neck surgery  1999, 2009   had rods placed  . [place] [facility]) No blackages found.   . CARPAL TUNNEL RELEASE    . PARTIAL HYSTERECTOMY     age 61, no cancer    FAMILY HISTORY Family History  Problem Relation Age of Onset  . Diabetes Mother   . Hypertension Mother   . Heart disease Mother   . Prostate cancer Father   . Heart disease Father   . Diabetes Brother     GYNECOLOGIC HISTORY:  No LMP recorded. Patient has had a hysterectomy.     ADVANCED DIRECTIVES:    HEALTH MAINTENANCE: Social History  Substance Use Topics  . Smoking status: Never Smoker  . Smokeless tobacco: Never Used     Comment: quit 1985  . Alcohol use No    Allergies  Allergen Reactions  . Morphine Rash    Headache too  . Codeine Other (See Comments)    Reaction: Unknown   . Latex Other (See Comments)    Reaction:  Unknown   . Morphine And Related Other (See Comments)    Reaction:  Unknown   . Relafen [Nabumetone] Hives  . Tape Other (See Comments) and Rash    Reaction:  Unknown     Current Outpatient Prescriptions  Medication Sig Dispense Refill  . albuterol (PROVENTIL HFA;VENTOLIN HFA) 108 (90 BASE) MCG/ACT inhaler Inhale 2 puffs into the lungs every 6 (six) hours as needed for wheezing or shortness of breath.    Marland Kitchen albuterol (PROVENTIL) (2.5 MG/3ML) 0.083% nebulizer solution INHALE CONTENTS OF 1 VIAL VIA NEBULIZER THREE TIMES A DAY prn    . ALPRAZolam (XANAX) 1 MG tablet Take 1 tablet (1 mg total) by mouth 3 (three) times daily. 90 tablet 1  . Ascorbic Acid (VITAMIN C) 1000 MG tablet Take 3,000 mg by mouth daily.    . bosutinib (BOSULIF) 100 MG tablet Take 3 tablets (300 mg total) by  mouth daily. 90 tablet 5  . celecoxib (CELEBREX) 200 MG capsule TAKE 1 CAPSULE (200 MG TOTAL) BY MOUTH TWO (2) TIMES A DAY.    . cetirizine (ZYRTEC) 10 MG tablet Take 20 mg by mouth daily.     . chlorpheniramine-HYDROcodone (TUSSIONEX PENNKINETIC ER) 10-8 MG/5ML SUER Take 5 mLs by mouth 2 (two) times daily. 140 mL 0  . Cholecalciferol (VITAMIN D PO) Take 1 tablet by mouth daily.    Marland Kitchen co-enzyme Q-10 50 MG capsule Take 50 mg by mouth daily.    . cyanocobalamin (,VITAMIN B-12,) 1000 MCG/ML injection Inject 1,000 mcg into the muscle every 30 (thirty) days.    . DULoxetine (CYMBALTA) 20 MG capsule Take 1 capsule (20 mg total) by mouth 3 (three) times daily. 90 capsule 5  . estradiol (ESTRACE) 2 MG tablet Take 2 mg by mouth daily.    . fluconazole (DIFLUCAN) 150 MG tablet     . fluticasone (FLONASE) 50 MCG/ACT nasal spray Place 1-2 sprays into both nostrils as needed for rhinitis.     . furosemide (LASIX) 40 MG tablet Take 1 tablet (40 mg total) by mouth daily. 30 tablet 3  . losartan (COZAAR) 100 MG tablet Take 100 mg by mouth daily.    Marland Kitchen MAGNESIUM PO Take 4 tablets by mouth daily.     . montelukast (SINGULAIR) 10 MG tablet TAKE 1 TABLET (10 MG TOTAL) BY MOUTH NIGHTLY.    . montelukast (SINGULAIR) 10 MG tablet TAKE 1 TABLET BY MOUTH NIGHTLY.    Marland Kitchen Olopatadine HCl (PATADAY) 0.2 % SOLN Place 1 drop into both eyes daily.    . Olopatadine HCl 0.2 % SOLN Administer 1 drop to both eyes Two (2) times a day.    . Omega-3 Fatty Acids (OMEGA 3 PO) Take 3 capsules by mouth daily.    Marland Kitchen omeprazole (PRILOSEC) 40 MG capsule Take by mouth. Reported on 04/21/2015    . pregabalin (LYRICA) 150 MG capsule Take 2 capsules (300 mg total) by mouth at bedtime. 60 capsule 5  . spironolactone (ALDACTONE) 25 MG tablet Take 1 tablet (25 mg total) by mouth 2 (two) times daily. 60 tablet 5  . traZODone (DESYREL) 150 MG tablet Take 1 tablet (150 mg total) by mouth at bedtime. 30 tablet 5  . vitamin B-12 (CYANOCOBALAMIN) 1000 MCG  tablet Take 4,000 mcg by mouth daily.    Marland Kitchen acetaminophen-codeine (TYLENOL #3) 300-30 MG per tablet Take 1 tablet by mouth every 12 (twelve) hours as needed for moderate pain.     . diphenhydrAMINE (BENADRYL) 25 MG tablet Take 25 mg by mouth every 6 (six) hours as needed for itching or allergies.     Marland Kitchen insulin aspart (NOVOLOG) 100 UNIT/ML injection Inject 15 Units into the skin 3 (three) times daily as needed for high blood sugar.     Marland Kitchen  insulin glargine (LANTUS) 100 UNIT/ML injection Inject 0.37 mLs (37 Units total) into the skin daily. (Patient not taking: Reported on 05/31/2016) 10 mL 11   No current facility-administered medications for this visit.     OBJECTIVE: BP 131/83 (BP Location: Left Wrist, Patient Position: Sitting)   Pulse 89   Temp 97.2 F (36.2 C) (Tympanic)   Resp 18   Wt (!) 325 lb 4.6 oz (147.6 kg)   BMI 50.95 kg/m    Body mass index is 50.95 kg/m.    ECOG FS:1 - Symptomatic but completely ambulatory  General: Well-developed, obese, no acute distress. Eyes: Pink conjunctiva, anicteric sclera. Lungs: Clear to auscultation bilaterally. Heart: Regular rate and rhythm. No rubs, murmurs, or gallops. Abdomen: Soft, nontender, nondistended. No organomegaly noted, normoactive bowel sounds. Musculoskeletal: No edema, cyanosis, or clubbing. Neuro: Alert, answering all questions appropriately. Cranial nerves grossly intact. Skin: No rashes or petechiae noted. Psych: Normal affect.  LAB RESULTS:  No visits with results within 3 Day(s) from this visit.  Latest known visit with results is:  Admission on 05/17/2016, Discharged on 05/17/2016  Component Date Value Ref Range Status  . Sodium 05/17/2016 136  135 - 145 mmol/L Final  . Potassium 05/17/2016 4.1  3.5 - 5.1 mmol/L Final  . Chloride 05/17/2016 100* 101 - 111 mmol/L Final  . CO2 05/17/2016 28  22 - 32 mmol/L Final  . Glucose, Bld 05/17/2016 166* 65 - 99 mg/dL Final  . BUN 05/17/2016 16  6 - 20 mg/dL Final  .  Creatinine, Ser 05/17/2016 0.92  0.44 - 1.00 mg/dL Final  . Calcium 05/17/2016 9.2  8.9 - 10.3 mg/dL Final  . GFR calc non Af Amer 05/17/2016 >60  >60 mL/min Final  . GFR calc Af Amer 05/17/2016 >60  >60 mL/min Final   Comment: (NOTE) The eGFR has been calculated using the CKD EPI equation. This calculation has not been validated in all clinical situations. eGFR's persistently <60 mL/min signify possible Chronic Kidney Disease.   . Anion gap 05/17/2016 8  5 - 15 Final  . WBC 05/17/2016 6.5  3.6 - 11.0 K/uL Final  . RBC 05/17/2016 4.30  3.80 - 5.20 MIL/uL Final  . Hemoglobin 05/17/2016 12.1  12.0 - 16.0 g/dL Final  . HCT 05/17/2016 36.0  35.0 - 47.0 % Final  . MCV 05/17/2016 83.9  80.0 - 100.0 fL Final  . MCH 05/17/2016 28.1  26.0 - 34.0 pg Final  . MCHC 05/17/2016 33.5  32.0 - 36.0 g/dL Final  . RDW 05/17/2016 14.3  11.5 - 14.5 % Final  . Platelets 05/17/2016 260  150 - 440 K/uL Final  . Troponin I 05/17/2016 <0.03  <0.03 ng/mL Final  . Total Protein 05/17/2016 6.9  6.5 - 8.1 g/dL Final  . Albumin 05/17/2016 3.4* 3.5 - 5.0 g/dL Final  . AST 05/17/2016 64* 15 - 41 U/L Final  . ALT 05/17/2016 72* 14 - 54 U/L Final  . Alkaline Phosphatase 05/17/2016 58  38 - 126 U/L Final  . Total Bilirubin 05/17/2016 0.4  0.3 - 1.2 mg/dL Final  . Bilirubin, Direct 05/17/2016 <0.1* 0.1 - 0.5 mg/dL Final  . Indirect Bilirubin 05/17/2016 NOT CALCULATED  0.3 - 0.9 mg/dL Final  . B Natriuretic Peptide 05/17/2016 21.0  0.0 - 100.0 pg/mL Final    STUDIES: No results found.  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 Non-compliance/SE-hypomag]; START  BOSULIF [no Mutation analysis done] [424m]; May 2015- bcr-abl-Undetectable; 2053m[AE-LFTs] SEP 2015- bcr-abl- Increase 0.052%; 10014mnon-compliance] JAN 2016 bcr-abl Increased 5.456%; 300m63mune 2016- bcr-abl <0.001%; STOPPED Bosulif end  SEP 2016 [non-compliance];Sep 2016- B3 A2- 0.026% [increasing]; OCT 2016- MUTATION ANALYSIS- not done sec to lack of enough transcripts;  # NOV 1st Re-START BOLSULIF at 300mg34mJAN 2017- 2.44 [[place]]  PLAN:    1. CML:PJJO:ACZYSAYes she is currently taking 400 mg of Bosulif, but she changes her dose and stops it altogether then restarts without medical input. Despite this, her most recent BCR-ABL remains undetectable. Continue current dosing and return to clinic in 3 months for laboratory work and then in 6 months for laboratory work and further evaluation. It was previously offered to the patient for a referral for second opinion since her disease is difficult to control, but she has declined. 2. Pain: As per treatment of her primary care Physician.  Approximately 30 minutes was spent in discussion of which greater than 50% was consultation.  Patient expressed understanding and was in agreement with this plan. She also understands that She can call clinic at any time with any questions, concerns, or complaints.    TimotLloyd Huger  06/06/2016 8:23 PM

## 2016-06-11 IMAGING — RF DG ESOPHAGUS
6 series · 10 of 10 positions shown · non-contrast
Comparison: None.

CLINICAL DATA: Dysphagia.

EXAM:
ESOPHOGRAM/BARIUM SWALLOW
TECHNIQUE: Single contrast examination was performed using  thin.
FLUOROSCOPY TIME:  Radiation Exposure Index (as provided by the
fluoroscopic device): 31.5 mGy

[Series 1: fluoro_barium 2fps_bw · 0.19mm/px · 4 of 6 frames shown (1 of 6)]
[frame 1/6]
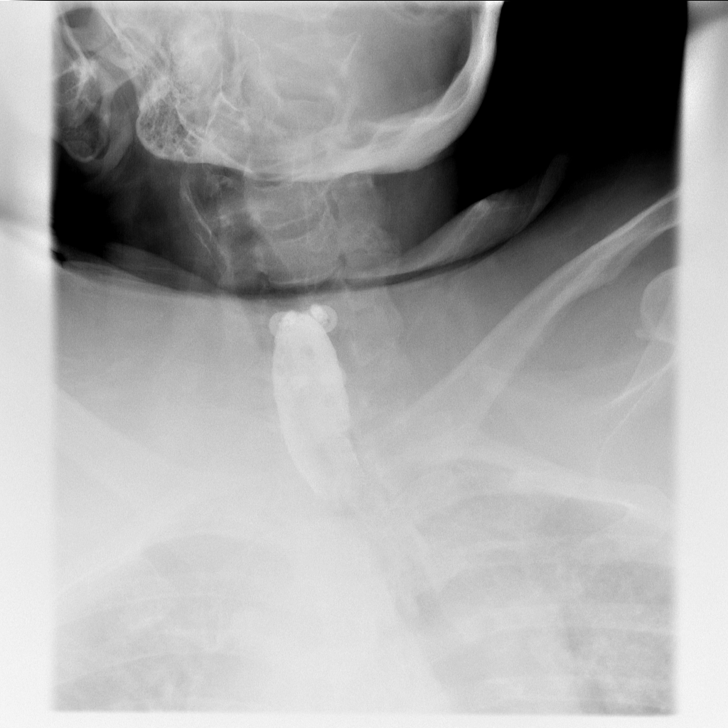
[frame 4/6]
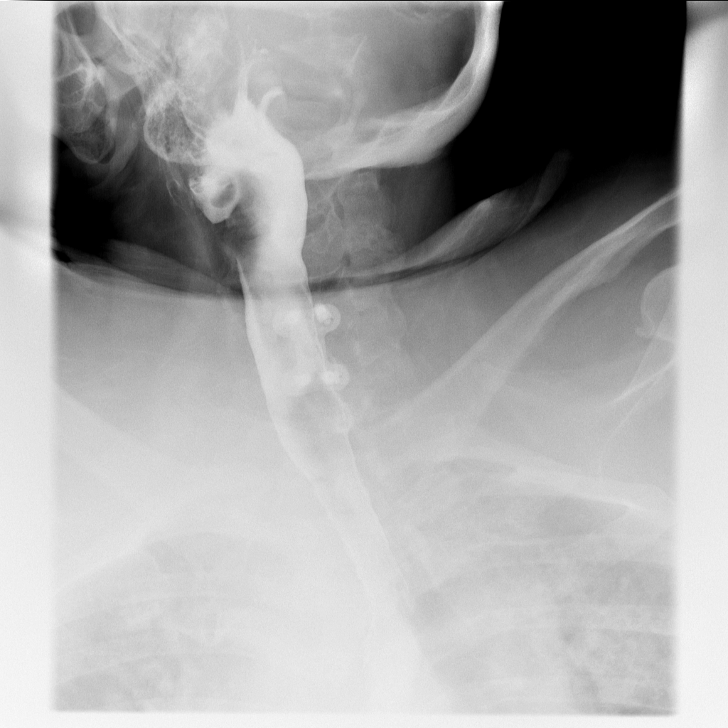
[frame 5/6]
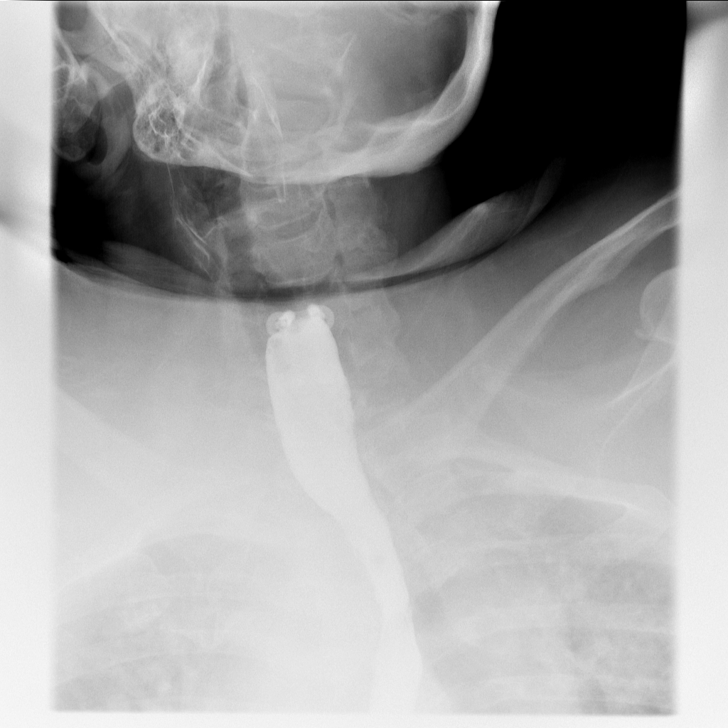
[frame 6/6]
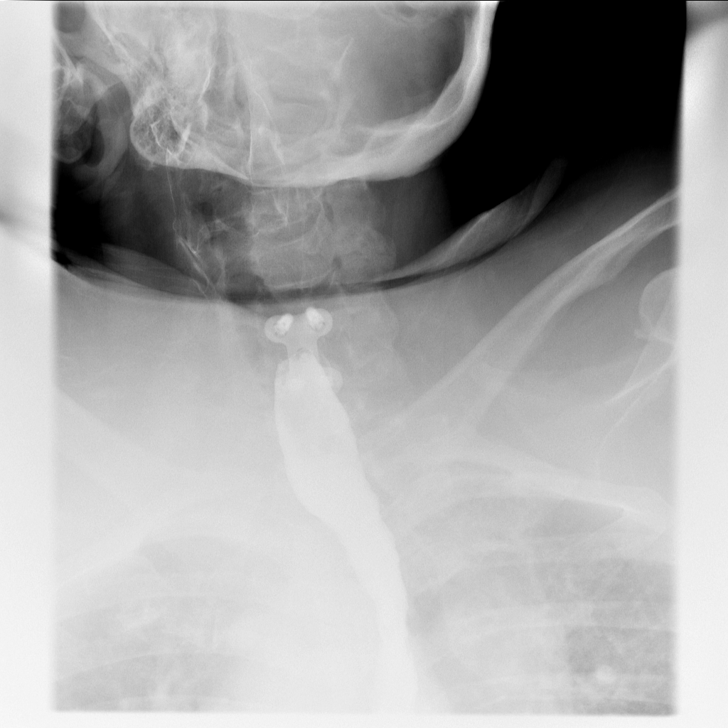

[Series 2: fluoro_barium 2fps_bw · 0.19mm/px · 1 of 1 slices shown (2 of 6)]
[im 1/1]
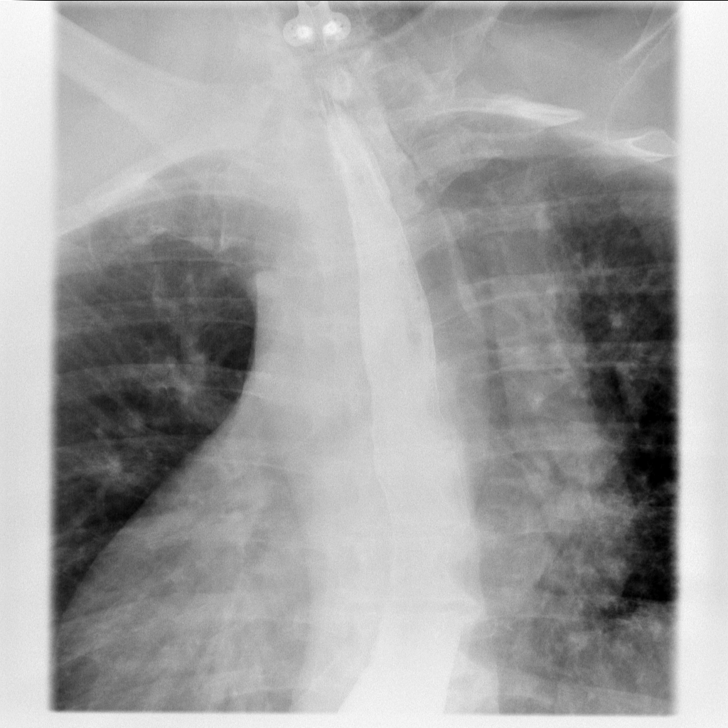

[Series 3: fluoro_barium 2fps_bw · 0.19mm/px · 1 of 1 slices shown (3 of 6)]
[im 1/1]
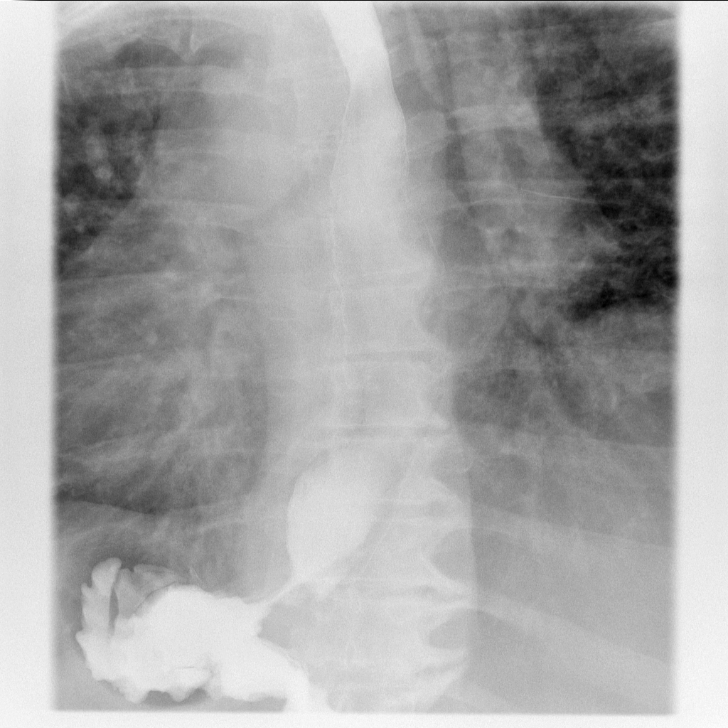

[Series 4: fluoro_barium 2fps_bw · 0.19mm/px · 1 of 1 slices shown (4 of 6)]
[im 1/1]
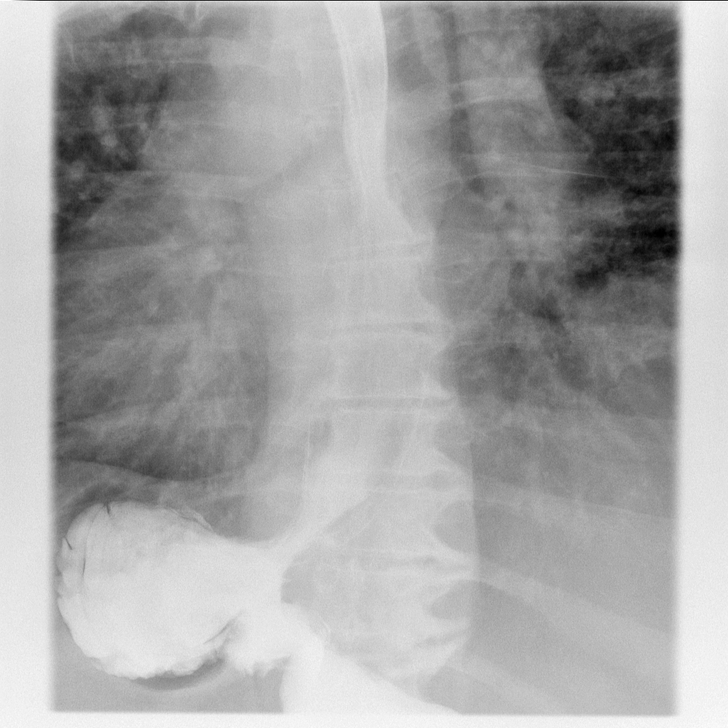

[Series 5: fluoro_barium 2fps_bw · 0.19mm/px · 2 of 2 frames shown (5 of 6)]
[frame 1/2]
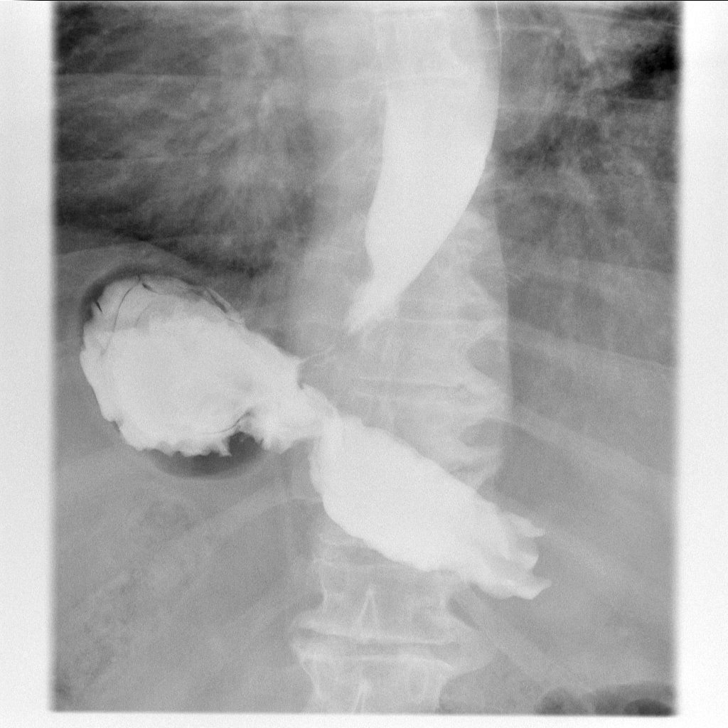
[frame 2/2]
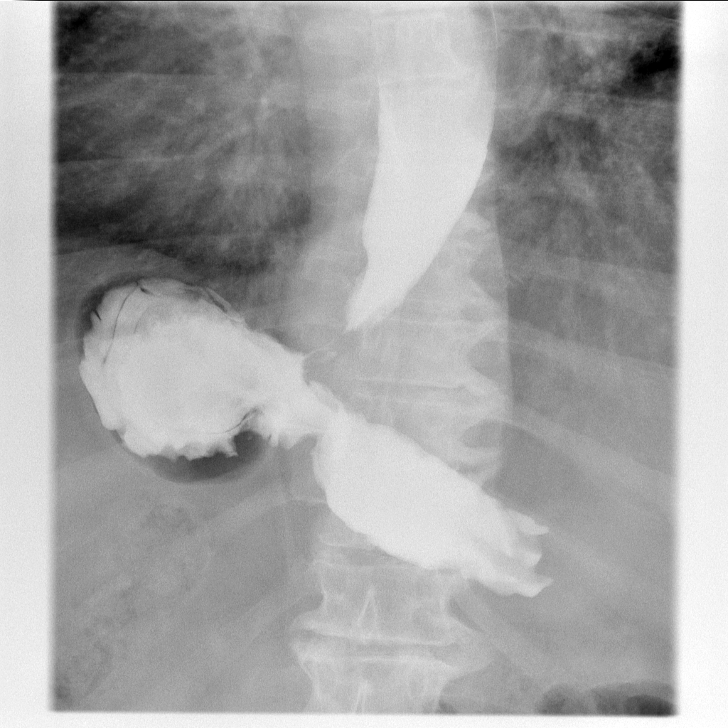

[Series 6: fluoro_barium 2fps_bw · 0.19mm/px · 1 of 1 slices shown (6 of 6)]
[im 1/1]
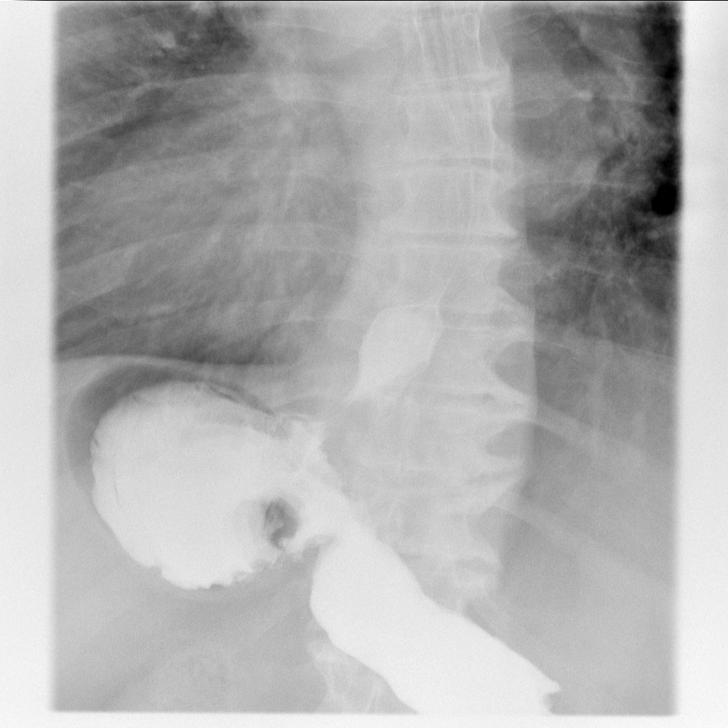

[10 of 10 positions shown; findings below may reference images not displayed]

FINDINGS: This is a limited exam due the patient's clinical condition. The
esophagus is widely patent. No obstructing lesion. No reflux
demonstrated.
IMPRESSION: Normal limited exam.

## 2016-06-14 ENCOUNTER — Other Ambulatory Visit: Payer: Self-pay

## 2016-06-14 DIAGNOSIS — C9210 Chronic myeloid leukemia, BCR/ABL-positive, not having achieved remission: Secondary | ICD-10-CM

## 2016-06-14 MED ORDER — BOSUTINIB 100 MG PO TABS: 300.0000 mg | ORAL_TABLET | Freq: Every day | ORAL | 5 refills | Status: DC

## 2016-06-26 ENCOUNTER — Other Ambulatory Visit: Payer: Self-pay

## 2016-06-26 DIAGNOSIS — C9210 Chronic myeloid leukemia, BCR/ABL-positive, not having achieved remission: Secondary | ICD-10-CM

## 2016-06-26 MED ORDER — BOSUTINIB 100 MG PO TABS: 400.0000 mg | ORAL_TABLET | Freq: Every day | ORAL | 5 refills | Status: DC

## 2016-08-12 ENCOUNTER — Other Ambulatory Visit: Payer: Self-pay

## 2016-08-12 DIAGNOSIS — C9210 Chronic myeloid leukemia, BCR/ABL-positive, not having achieved remission: Secondary | ICD-10-CM

## 2016-08-12 MED ORDER — BOSUTINIB 100 MG PO TABS: 400.0000 mg | ORAL_TABLET | Freq: Every day | ORAL | 5 refills | Status: DC

## 2016-08-27 ENCOUNTER — Other Ambulatory Visit: Payer: Self-pay | Admitting: Psychiatry

## 2016-08-30 ENCOUNTER — Inpatient Hospital Stay: Payer: Medicaid Other | Attending: Oncology

## 2016-08-30 DIAGNOSIS — I10 Essential (primary) hypertension: Secondary | ICD-10-CM | POA: Diagnosis not present

## 2016-08-30 DIAGNOSIS — Z79899 Other long term (current) drug therapy: Secondary | ICD-10-CM | POA: Diagnosis not present

## 2016-08-30 DIAGNOSIS — E119 Type 2 diabetes mellitus without complications: Secondary | ICD-10-CM | POA: Insufficient documentation

## 2016-08-30 DIAGNOSIS — Z9114 Patient's other noncompliance with medication regimen: Secondary | ICD-10-CM | POA: Insufficient documentation

## 2016-08-30 DIAGNOSIS — E785 Hyperlipidemia, unspecified: Secondary | ICD-10-CM | POA: Diagnosis not present

## 2016-08-30 DIAGNOSIS — M1990 Unspecified osteoarthritis, unspecified site: Secondary | ICD-10-CM | POA: Diagnosis not present

## 2016-08-30 DIAGNOSIS — F329 Major depressive disorder, single episode, unspecified: Secondary | ICD-10-CM | POA: Diagnosis not present

## 2016-08-30 DIAGNOSIS — Z9119 Patient's noncompliance with other medical treatment and regimen: Secondary | ICD-10-CM | POA: Diagnosis not present

## 2016-08-30 DIAGNOSIS — Z794 Long term (current) use of insulin: Secondary | ICD-10-CM | POA: Insufficient documentation

## 2016-08-30 DIAGNOSIS — J449 Chronic obstructive pulmonary disease, unspecified: Secondary | ICD-10-CM | POA: Diagnosis not present

## 2016-08-30 DIAGNOSIS — Z8719 Personal history of other diseases of the digestive system: Secondary | ICD-10-CM | POA: Insufficient documentation

## 2016-08-30 DIAGNOSIS — C9210 Chronic myeloid leukemia, BCR/ABL-positive, not having achieved remission: Secondary | ICD-10-CM | POA: Diagnosis not present

## 2016-08-30 LAB — CBC WITH DIFFERENTIAL/PLATELET
BASOS PCT: 1 %
Basophils Absolute: 0 10*3/uL (ref 0–0.1)
EOS ABS: 0.2 10*3/uL (ref 0–0.7)
Eosinophils Relative: 3 %
HEMATOCRIT: 37.2 % (ref 35.0–47.0)
HEMOGLOBIN: 12.3 g/dL (ref 12.0–16.0)
LYMPHS ABS: 1 10*3/uL (ref 1.0–3.6)
Lymphocytes Relative: 16 %
MCH: 27.7 pg (ref 26.0–34.0)
MCHC: 32.9 g/dL (ref 32.0–36.0)
MCV: 84.1 fL (ref 80.0–100.0)
Monocytes Absolute: 0.4 10*3/uL (ref 0.2–0.9)
Monocytes Relative: 7 %
NEUTROS ABS: 4.4 10*3/uL (ref 1.4–6.5)
NEUTROS PCT: 73 %
Platelets: 221 10*3/uL (ref 150–440)
RBC: 4.43 MIL/uL (ref 3.80–5.20)
RDW: 14.2 % (ref 11.5–14.5)
WBC: 5.9 10*3/uL (ref 3.6–11.0)

## 2016-08-30 LAB — COMPREHENSIVE METABOLIC PANEL
ALBUMIN: 3.1 g/dL — AB (ref 3.5–5.0)
ALT: 37 U/L (ref 14–54)
AST: 38 U/L (ref 15–41)
Alkaline Phosphatase: 58 U/L (ref 38–126)
Anion gap: 9 (ref 5–15)
BUN: 23 mg/dL — AB (ref 6–20)
CALCIUM: 9 mg/dL (ref 8.9–10.3)
CO2: 28 mmol/L (ref 22–32)
CREATININE: 1.01 mg/dL — AB (ref 0.44–1.00)
Chloride: 98 mmol/L — ABNORMAL LOW (ref 101–111)
GFR calc Af Amer: >60 mL/min (ref >60)
GFR calc non Af Amer: 59 mL/min — ABNORMAL LOW (ref >60)
GLUCOSE: 177 mg/dL — AB (ref 65–99)
Potassium: 4 mmol/L (ref 3.5–5.1)
SODIUM: 135 mmol/L (ref 135–145)
Total Bilirubin: 0.5 mg/dL (ref 0.3–1.2)
Total Protein: 6.7 g/dL (ref 6.5–8.1)

## 2016-09-03 LAB — BCR-ABL1, CML/ALL, PCR, QUANT: b3a2 transcript: 0.1205 %

## 2016-09-09 ENCOUNTER — Ambulatory Visit: Payer: Federal, State, Local not specified - Other | Admitting: Psychiatry

## 2016-09-09 ENCOUNTER — Telehealth: Payer: Self-pay

## 2016-09-09 NOTE — Telephone Encounter (Signed)
PT DID HAVE AN APPT FOR TODAY BUT IT WAS R/S FOR 09-23-16 BECAUSE PT IS SICK .  PT NEEDS ENOUGH MEDICATION CALLED IN UNTIL NEXT APPT.

## 2016-09-10 ENCOUNTER — Other Ambulatory Visit: Payer: Self-pay | Admitting: Psychiatry

## 2016-09-10 MED ORDER — PREGABALIN 150 MG PO CAPS: 300.0000 mg | ORAL_CAPSULE | Freq: Every day | ORAL | 0 refills | Status: DC

## 2016-09-10 MED ORDER — ALPRAZOLAM 1 MG PO TABS: 1.0000 mg | ORAL_TABLET | Freq: Three times a day (TID) | ORAL | 0 refills | Status: DC

## 2016-09-10 MED ORDER — TRAZODONE HCL 150 MG PO TABS: 150.0000 mg | ORAL_TABLET | Freq: Every day | ORAL | 0 refills | Status: DC

## 2016-09-10 MED ORDER — DULOXETINE HCL 20 MG PO CPEP: 20.0000 mg | ORAL_CAPSULE | Freq: Three times a day (TID) | ORAL | 0 refills | Status: DC

## 2016-09-11 ENCOUNTER — Telehealth: Payer: Self-pay

## 2016-09-11 NOTE — Telephone Encounter (Signed)
-----   Message from Lloyd Huger, MD sent at 09/11/2016 12:20 AM EDT ----- Regarding: RE: lab results Contact: [phone] She has a slight increase in her BCR-ABL mutation and I should see her in the next couple weeks to discuss.  Thanks.  ----- Message ----- From: Luretha Murphy, CMA Sent: 09/09/2016  11:54 AM To: Lloyd Huger, MD Subject: [place] Alar: lab results                                  ----- Message ----- From: Secundino Ginger Sent: 09/09/2016  11:44 AM To: Luretha Murphy, CMA Subject: lab results                                    Pt called and is worried about her labs she had drawn on 7/6. SHe wants to know the results.

## 2016-09-11 NOTE — Telephone Encounter (Signed)
Notified patient of results and she verbalized understanding. I have contacted biologics for a refill of her medication. Message sent to scheduling  for patient to see MD in [place].

## 2016-09-19 NOTE — Progress Notes (Deleted)
[place]  [phone]  Fax:([phone]) [place] DOB: [birth year]  MR#: [id number]  LPF#:[id number]  Patient Care Team: [facility] as PCP - General  CHIEF COMPLAINT: CML  INTERVAL HISTORY: Patient returns to clinic for repeat laboratory work and further evaluation. She has missed several appointments in the interim. She has multiple complaints today that are all chronic in nature and unrelated to her CML or treatment. Patient states she is currently taking 400 mg Bosulif daily, but she changes her dose and stops it altogether then restarts without medical input. She has a long history of noncompliance. She has no neurologic complaints. She denies any fevers, chills, night sweats, or weight loss. She has no chest pain or shortness of breath. She denies any nausea, vomiting, constipation, or diarrhea. She has no urinary complaints.  REVIEW OF SYSTEMS:   Review of Systems  Constitutional: Positive for malaise/fatigue. Negative for fever and weight loss.  Eyes: Negative.   Respiratory: Positive for shortness of breath. Negative for cough.   Cardiovascular: Negative.  Negative for chest pain and leg swelling.  Gastrointestinal: Negative.  Negative for abdominal pain.  Genitourinary: Negative.   Musculoskeletal: Positive for back pain, joint pain, myalgias and neck pain.  Skin: Positive for itching and rash.  Neurological: Positive for weakness.  Psychiatric/Behavioral: Negative.     As per HPI. Otherwise, a complete review of systems is negative.  ONCOLOGY HISTORY:  No history exists.    PAST MEDICAL HISTORY: Past Medical History:  Diagnosis Date  . CML (chronic myelocytic leukemia) ([place])   . COPD (chronic obstructive pulmonary disease) ([place])   . Depression   . DM type 2 (diabetes mellitus, type 2) ([place])   . Environmental allergies   . History of gastric ulcer   . HLD (hyperlipidemia)   . IDA (iron deficiency anemia)    chronic  . Leukemia, chronic myeloid ([place])   . Osteoarthritis   . Panic disorder   . PSS (progressive systemic sclerosis) ([place])   . Unspecified essential hypertension     PAST SURGICAL HISTORY: Past Surgical History:  Procedure Laterality Date  . back and neck surgery  1999, 2009   had rods placed  . [facility]) No blackages found.   . CARPAL TUNNEL RELEASE    . PARTIAL HYSTERECTOMY     age 67, no cancer    FAMILY HISTORY Family History  Problem Relation Age of Onset  . Diabetes Mother   . Hypertension Mother   . Heart disease Mother   . Prostate cancer Father   . Heart disease Father   . Diabetes Brother     GYNECOLOGIC HISTORY:  No LMP recorded. Patient has had a hysterectomy.     ADVANCED DIRECTIVES:    HEALTH MAINTENANCE: Social History  Substance Use Topics  . Smoking status: Never Smoker  . Smokeless tobacco: Never Used     Comment: quit 1985  . Alcohol use No    Allergies  Allergen Reactions  . Morphine Rash    Headache too  . Codeine Other (See Comments)    Reaction: Unknown   . Latex Other (See Comments)    Reaction:  Unknown   . Morphine And Related Other (See Comments)    Reaction:  Unknown   . Relafen [Nabumetone] Hives  . Tape Other (See Comments) and Rash    Reaction:  Unknown     Current Outpatient Prescriptions  Medication Sig Dispense Refill  . acetaminophen-codeine (TYLENOL #3) 300-30 MG per tablet Take 1 tablet by mouth every 12 (twelve) hours as needed for moderate pain.     Marland Kitchen albuterol (PROVENTIL HFA;VENTOLIN HFA) 108 (90 BASE) MCG/ACT inhaler Inhale 2 puffs into the lungs every 6 (six) hours as needed for wheezing or shortness of breath.    Marland Kitchen albuterol (PROVENTIL) (2.5 MG/3ML) 0.083% nebulizer solution INHALE CONTENTS OF 1 VIAL VIA NEBULIZER THREE TIMES A DAY prn    . ALPRAZolam (XANAX) 1 MG tablet Take 1 tablet (1 mg total) by mouth 3 (three) times daily. 90 tablet 0  .  Ascorbic Acid (VITAMIN C) 1000 MG tablet Take 3,000 mg by mouth daily.    . bosutinib (BOSULIF) 100 MG tablet Take 4 tablets (400 mg total) by mouth daily. 90 tablet 5  . celecoxib (CELEBREX) 200 MG capsule TAKE 1 CAPSULE (200 MG TOTAL) BY MOUTH TWO (2) TIMES A DAY.    . cetirizine (ZYRTEC) 10 MG tablet Take 20 mg by mouth daily.     . chlorpheniramine-HYDROcodone (TUSSIONEX PENNKINETIC ER) 10-8 MG/5ML SUER Take 5 mLs by mouth 2 (two) times daily. 140 mL 0  . Cholecalciferol (VITAMIN D PO) Take 1 tablet by mouth daily.    Marland Kitchen co-enzyme Q-10 50 MG capsule Take 50 mg by mouth daily.    . cyanocobalamin (,VITAMIN B-12,) 1000 MCG/ML injection Inject 1,000 mcg into the muscle every 30 (thirty) days.    . diphenhydrAMINE (BENADRYL) 25 MG tablet Take 25 mg by mouth every 6 (six) hours as needed for itching or allergies.     . DULoxetine (CYMBALTA) 20 MG capsule Take 1 capsule (20 mg total) by mouth 3 (three) times daily. 90 capsule 0  . estradiol (ESTRACE) 2 MG tablet Take 2 mg by mouth daily.    . fluconazole (DIFLUCAN) 150 MG tablet     . fluticasone (FLONASE) 50 MCG/ACT nasal spray Place 1-2 sprays into both nostrils as needed for rhinitis.     . furosemide (LASIX) 40 MG tablet Take 1 tablet (40 mg total) by mouth daily. 30 tablet 3  . insulin aspart (NOVOLOG) 100 UNIT/ML injection Inject 15 Units into the skin 3 (three) times daily as needed for high blood sugar.     . insulin glargine (LANTUS) 100 UNIT/ML injection Inject 0.37 mLs (37 Units total) into the skin daily. (Patient not taking: Reported on 05/31/2016) 10 mL 11  . losartan (COZAAR) 100 MG tablet Take 100 mg by mouth daily.    Marland Kitchen MAGNESIUM PO Take 4 tablets by mouth daily.     . montelukast (SINGULAIR) 10 MG tablet TAKE 1 TABLET (10 MG TOTAL) BY MOUTH NIGHTLY.    . montelukast (SINGULAIR) 10 MG tablet TAKE 1 TABLET BY MOUTH NIGHTLY.    Marland Kitchen Olopatadine HCl (PATADAY) 0.2 % SOLN Place 1 drop into both eyes daily.    . Olopatadine HCl 0.2 % SOLN  Administer 1 drop to both eyes Two (2) times a day.    . Omega-3 Fatty Acids (OMEGA 3 PO) Take 3 capsules by mouth daily.    Marland Kitchen omeprazole (PRILOSEC) 40 MG capsule Take by mouth. Reported on 04/21/2015    . pregabalin (LYRICA) 150 MG capsule Take 2 capsules (300 mg total) by mouth at bedtime. 60 capsule 0  . spironolactone (ALDACTONE) 25 MG tablet Take 1 tablet (25 mg total) by mouth 2 (two) times daily. 60 tablet 5  . traZODone (DESYREL) 150 MG tablet Take 1 tablet (150  mg total) by mouth at bedtime. 30 tablet 0  . vitamin B-12 (CYANOCOBALAMIN) 1000 MCG tablet Take 4,000 mcg by mouth daily.     No current facility-administered medications for this visit.     OBJECTIVE: There were no vitals taken for this visit.   There is no height or weight on file to calculate BMI.    ECOG FS:1 - Symptomatic but completely ambulatory  General: Well-developed, obese, no acute distress. Eyes: Pink conjunctiva, anicteric sclera. Lungs: Clear to auscultation bilaterally. Heart: Regular rate and rhythm. No rubs, murmurs, or gallops. Abdomen: Soft, nontender, nondistended. No organomegaly noted, normoactive bowel sounds. Musculoskeletal: No edema, cyanosis, or clubbing. Neuro: Alert, answering all questions appropriately. Cranial nerves grossly intact. Skin: No rashes or petechiae noted. Psych: Normal affect.  LAB RESULTS:  No visits with results within 3 Day(s) from this visit.  Latest known visit with results is:  Appointment on 08/30/2016  Component Date Value Ref Range Status  . b2a2 transcript 08/30/2016 Comment  % Final   Comment: (NOTE)           <0.0032 % (sensitivity limit of assay)   . b3a2 transcript 08/30/2016 0.1205  % Final  . E1A2 Transcript 08/30/2016 Comment  % Final   Comment: (NOTE)           <0.0032 % (sensitivity limit of assay)   . Interpretation (BCRAL): 08/30/2016 ABL1 e14a2 (b3a2, p210) fusion transcript.   Final   Comment: Comment POSITIVE for the BCR   . Director  Review Adrian Saran): 08/30/2016 Comment   Final   Comment: (NOTE) Constance Goltz, PhD, [facility]               Director, [place] [facility] [place] [facility] [place], [place]   . Background: 08/30/2016 Comment   Corrected   Comment: (NOTE) This assay can detect three different types of BCR-ABL1 fusion transcripts associated with CML, ALL, and AML: e13a2 (previously b2a2) and e14a2 (previously b3a2) (major breakpoint, p210), as well as e1a2 (minor breakpoint, p190). The e13a2 and e14a2 transcript values are titrated to the current International Scale (IS). The standardized baseline is 100% BCR-ABL1 (IS) and major molecular response (MMR) is equivalent to 0.1% BCR-ABL1 (IS) corresponding to a 3-log reduction. Results should be correlated with appropriate clinical and laboratory information as indicated.   . Methodology 08/30/2016 Comment   Corrected   Comment: (NOTE) Total RNA is isolated from the sample and subject to a real-time, reverse transcriptase polymerase chain reaction (RT-PCR). The PCR primers and probes are specific for BCR-ABL1 e13a2, e14a2 and e1a2 fusion transcripts. The ABL1 transcript is amplified as the control for cDNA quantity and quality. Serial dilutions of a validated positive control RNA with known t(9;22) BCR-ABL1 are used as reference for quantification of BCR-ABL1 relative to ABL1. The numeric BCR-ABL1 level is reportd as % BCR-ABL1/ABL1 and the detection sensitivity is 4.5 log below the standard baseline. This test was developed and its performance characteristics determined by [facility]. It has not been cleared or approved by the Food and Drug Administration. References:    1. Anastasia Fiedler and Branford S: Seminars in Hematology 2003;  40 (suppl2):62-68.    2. White HE, et al. Blood 2010; 116: e111-117.    3. NCCN Clinical Practice Guidelines in Oncology,  Chronic       Myeloid Leukemia. V2. 2017.                           Performed At: [facility] [address] [place], [place] [id number] Nechama Guard MD WS:[id number] Performed At: [facility] [place], [place] [id number] Nechama Guard MD PR:[id number]   . WBC 08/30/2016 5.9  3.6 - 11.0 K/uL Final  . RBC 08/30/2016 4.43  3.80 - 5.20 MIL/uL Final  . Hemoglobin 08/30/2016 12.3  12.0 - 16.0 g/dL Final  . HCT 08/30/2016 37.2  35.0 - 47.0 % Final  . MCV 08/30/2016 84.1  80.0 - 100.0 fL Final  . MCH 08/30/2016 27.7  26.0 - 34.0 pg Final  . MCHC 08/30/2016 32.9  32.0 - 36.0 g/dL Final  . RDW 08/30/2016 14.2  11.5 - 14.5 % Final  . Platelets 08/30/2016 221  150 - 440 K/uL Final  . Neutrophils Relative % 08/30/2016 73  % Final  . Neutro Abs 08/30/2016 4.4  1.4 - 6.5 K/uL Final  . Lymphocytes Relative 08/30/2016 16  % Final  . Lymphs Abs 08/30/2016 1.0  1.0 - 3.6 K/uL Final  . Monocytes Relative 08/30/2016 7  % Final  . Monocytes Absolute 08/30/2016 0.4  0.2 - 0.9 K/uL Final  . Eosinophils Relative 08/30/2016 3  % Final  . Eosinophils Absolute 08/30/2016 0.2  0 - 0.7 K/uL Final  . Basophils Relative 08/30/2016 1  % Final  . Basophils Absolute 08/30/2016 0.0  0 - 0.1 K/uL Final  . Sodium 08/30/2016 135  135 - 145 mmol/L Final  . Potassium 08/30/2016 4.0  3.5 - 5.1 mmol/L Final  . Chloride 08/30/2016 98* 101 - 111 mmol/L Final  . CO2 08/30/2016 28  22 - 32 mmol/L Final  . Glucose, Bld 08/30/2016 177* 65 - 99 mg/dL Final  . BUN 08/30/2016 23* 6 - 20 mg/dL Final  . Creatinine, Ser 08/30/2016 1.01* 0.44 - 1.00 mg/dL Final  . Calcium 08/30/2016 9.0  8.9 - 10.3 mg/dL Final  . Total Protein 08/30/2016 6.7  6.5 - 8.1 g/dL Final  . Albumin 08/30/2016 3.1* 3.5 - 5.0 g/dL Final  . AST 08/30/2016 38  15 - 41 U/L Final  . ALT 08/30/2016 37  14 - 54 U/L Final  . Alkaline Phosphatase 08/30/2016 58  38 - 126 U/L Final  . Total Bilirubin 08/30/2016 0.5  0.3 - 1.2  mg/dL Final  . GFR calc non Af Amer 08/30/2016 59* >60 mL/min Final  . GFR calc Af Amer 08/30/2016 >60  >60 mL/min Final   Comment: (NOTE) The eGFR has been calculated using the CKD EPI equation. This calculation has not been validated in all clinical situations. eGFR's persistently <60 mL/min signify possible Chronic Kidney Disease.   . Anion gap 08/30/2016 9  5 - 15 Final    STUDIES: No results found.  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 Non-compliance/SE-hypomag]; START BOSULIF [no Mutation analysis done] [4107m]; May 2015- bcr-abl-Undetectable; 2053m[AE-LFTs] SEP 2015- bcr-abl- Increase 0.052%; 10023mnon-compliance] JAN 2016 bcr-abl Increased 5.456%; 300m27mune 2016- bcr-abl <0.001%; STOPPED Bosulif end SEP 2016 [non-compliance];Sep 2016- B3 A2- 0.026% [increasing]; OCT  2016- MUTATION ANALYSIS- not done sec to lack of enough transcripts;  # NOV 1st Re-START BOLSULIF at 345m/d; JAN 2017- 2.44 [RISING]  PLAN:    1. CJUV:QQUIVHOStates she is currently taking 400 mg of Bosulif, but she changes her dose and stops it altogether then restarts without medical input. Despite this, her most recent BCR-ABL remains undetectable. Continue current dosing and return to clinic in 3 months for laboratory work and then in 6 months for laboratory work and further evaluation. It was previously offered to the patient for a referral for second opinion since her disease is difficult to control, but she has declined. 2. Pain: As per treatment of her primary care Physician.  Approximately 30 minutes was spent in discussion of which greater than 50% was consultation.  Patient expressed understanding and was in agreement with this plan. She also understands that She can call clinic at any time with any questions, concerns, or complaints.    TLloyd Huger MD   09/19/2016  10:07 PM

## 2016-09-20 ENCOUNTER — Inpatient Hospital Stay: Payer: Medicaid Other | Admitting: Oncology

## 2016-09-23 ENCOUNTER — Ambulatory Visit (INDEPENDENT_AMBULATORY_CARE_PROVIDER_SITE_OTHER): Payer: Medicaid Other | Admitting: Psychiatry

## 2016-09-23 ENCOUNTER — Encounter: Payer: Self-pay | Admitting: Psychiatry

## 2016-09-23 VITALS — BP 150/83 | HR 102 | Temp 98.2°F | Wt 316.6 lb

## 2016-09-23 DIAGNOSIS — F3130 Bipolar disorder, current episode depressed, mild or moderate severity, unspecified: Secondary | ICD-10-CM

## 2016-09-23 DIAGNOSIS — F4310 Post-traumatic stress disorder, unspecified: Secondary | ICD-10-CM

## 2016-09-23 MED ORDER — PREGABALIN 150 MG PO CAPS: 150.0000 mg | ORAL_CAPSULE | Freq: Three times a day (TID) | ORAL | 5 refills | Status: DC

## 2016-09-23 MED ORDER — ALPRAZOLAM 1 MG PO TABS: 1.0000 mg | ORAL_TABLET | Freq: Three times a day (TID) | ORAL | 5 refills | Status: DC

## 2016-09-23 MED ORDER — TRAZODONE HCL 150 MG PO TABS: 150.0000 mg | ORAL_TABLET | Freq: Every day | ORAL | 5 refills | Status: DC

## 2016-09-23 MED ORDER — DULOXETINE HCL 20 MG PO CPEP: 20.0000 mg | ORAL_CAPSULE | Freq: Three times a day (TID) | ORAL | 5 refills | Status: DC

## 2016-09-23 NOTE — Progress Notes (Signed)
Follow-up for 61 year old woman with chronic moodiness and depression and anxiety. Medical problems have been common event. Heart failure has been worse. Mood stays down much of the time. No psychosis however. No suicidal ideation. A lot of her mood problems or direct reflections of her chronic pain and chronic fatigue.  Neatly dressed and groomed. Actually looks more alert and interactive than I have seen her in the recent past. Denies suicidal ideation denies psychosis. A little bit more on the ball like they can looks mentally quicker. Patient wants to increase her Lyrica dose. We discussed how 450 would probably be the maximum she could go to and the potential worsening of depression. We will increase the dose of her medicine and I will see her back in another 6 months she noted she can call sooner if needed.

## 2016-10-02 NOTE — Progress Notes (Deleted)
[place]  [phone]) [phone]  Fax:([phone]) [place] DOB: [birth year]  MR#: [id number]  QMG#:[id number]  Patient Care Team: [facility] as PCP - General  CHIEF COMPLAINT: CML  INTERVAL HISTORY: Patient returns to clinic for repeat laboratory work and further evaluation. She has missed several appointments in the interim. She has multiple complaints today that are all chronic in nature and unrelated to her CML or treatment. Patient states she is currently taking 400 mg Bosulif daily, but she changes her dose and stops it altogether then restarts without medical input. She has a long history of noncompliance. She has no neurologic complaints. She denies any fevers, chills, night sweats, or weight loss. She has no chest pain or shortness of breath. She denies any nausea, vomiting, constipation, or diarrhea. She has no urinary complaints.  REVIEW OF SYSTEMS:   Review of Systems  Constitutional: Positive for malaise/fatigue. Negative for fever and weight loss.  Eyes: Negative.   Respiratory: Positive for shortness of breath. Negative for cough.   Cardiovascular: Negative.  Negative for chest pain and leg swelling.  Gastrointestinal: Negative.  Negative for abdominal pain.  Genitourinary: Negative.   Musculoskeletal: Positive for back pain, joint pain, myalgias and neck pain.  Skin: Positive for itching and rash.  Neurological: Positive for weakness.  Psychiatric/Behavioral: Negative.     As per HPI. Otherwise, a complete review of systems is negative.  ONCOLOGY HISTORY:  No history exists.    PAST MEDICAL HISTORY: Past Medical History:  Diagnosis Date  . CML (chronic myelocytic leukemia) ([place])   . COPD (chronic obstructive pulmonary disease) ([place])   . Depression   . DM type 2 (diabetes mellitus, type 2) ([place])   . Environmental allergies   . History of gastric ulcer   . HLD (hyperlipidemia)   . IDA (iron deficiency anemia)    chronic  . Leukemia, chronic myeloid ([place])   . Osteoarthritis   . Panic disorder   . PSS (progressive systemic sclerosis) ([place])   . Unspecified essential hypertension     PAST SURGICAL HISTORY: Past Surgical History:  Procedure Laterality Date  . back and neck surgery  1999, 2009   had rods placed  . [facility]) No blackages found.   . CARPAL TUNNEL RELEASE    . PARTIAL HYSTERECTOMY     age 42, no cancer    FAMILY HISTORY Family History  Problem Relation Age of Onset  . Diabetes Mother   . Hypertension Mother   . Heart disease Mother   . Prostate cancer Father   . Heart disease Father   . Diabetes Brother     GYNECOLOGIC HISTORY:  No LMP recorded. Patient has had a hysterectomy.     ADVANCED DIRECTIVES:    HEALTH MAINTENANCE: Social History  Substance Use Topics  . Smoking status: Never Smoker  . Smokeless tobacco: Never Used     Comment: quit 1985  . Alcohol use No    Allergies  Allergen Reactions  . Morphine Rash    Headache too  . Codeine Other (See Comments)    Reaction: Unknown   . Latex Other (See Comments)    Reaction:  Unknown   . Morphine And Related Other (See Comments)    Reaction:  Unknown   . Relafen [Nabumetone] Hives  . Tape Other (See Comments) and Rash    Reaction:  Unknown     Current Outpatient Prescriptions  Medication Sig Dispense Refill  . acetaminophen-codeine (TYLENOL #3) 300-30 MG per tablet Take 1 tablet by mouth every 12 (twelve) hours as needed for moderate pain.     Marland Kitchen albuterol (PROVENTIL HFA;VENTOLIN HFA) 108 (90 BASE) MCG/ACT inhaler Inhale 2 puffs into the lungs every 6 (six) hours as needed for wheezing or shortness of breath.    Marland Kitchen albuterol (PROVENTIL) (2.5 MG/3ML) 0.083% nebulizer solution INHALE CONTENTS OF 1 VIAL VIA NEBULIZER THREE TIMES A DAY prn    . ALPRAZolam (XANAX) 1 MG tablet Take 1 tablet (1 mg total) by mouth 3 (three) times daily. 90 tablet 5  .  Ascorbic Acid (VITAMIN C) 1000 MG tablet Take 3,000 mg by mouth daily.    . bosutinib (BOSULIF) 100 MG tablet Take 4 tablets (400 mg total) by mouth daily. 90 tablet 5  . celecoxib (CELEBREX) 200 MG capsule TAKE 1 CAPSULE (200 MG TOTAL) BY MOUTH TWO (2) TIMES A DAY.    . cetirizine (ZYRTEC) 10 MG tablet Take 20 mg by mouth daily.     . chlorpheniramine-HYDROcodone (TUSSIONEX PENNKINETIC ER) 10-8 MG/5ML SUER Take 5 mLs by mouth 2 (two) times daily. 140 mL 0  . Cholecalciferol (VITAMIN D PO) Take 1 tablet by mouth daily.    Marland Kitchen co-enzyme Q-10 50 MG capsule Take 50 mg by mouth daily.    . cyanocobalamin (,VITAMIN B-12,) 1000 MCG/ML injection Inject 1,000 mcg into the muscle every 30 (thirty) days.    . diphenhydrAMINE (BENADRYL) 25 MG tablet Take 25 mg by mouth every 6 (six) hours as needed for itching or allergies.     . DULoxetine (CYMBALTA) 20 MG capsule Take 1 capsule (20 mg total) by mouth 3 (three) times daily. 90 capsule 5  . estradiol (ESTRACE) 2 MG tablet Take 2 mg by mouth daily.    . fluconazole (DIFLUCAN) 150 MG tablet     . fluticasone (FLONASE) 50 MCG/ACT nasal spray Place 1-2 sprays into both nostrils as needed for rhinitis.     . furosemide (LASIX) 40 MG tablet Take 1 tablet (40 mg total) by mouth daily. 30 tablet 3  . insulin aspart (NOVOLOG) 100 UNIT/ML injection Inject 15 Units into the skin 3 (three) times daily as needed for high blood sugar.     . insulin glargine (LANTUS) 100 UNIT/ML injection Inject 0.37 mLs (37 Units total) into the skin daily. 10 mL 11  . losartan (COZAAR) 100 MG tablet Take 100 mg by mouth daily.    Marland Kitchen MAGNESIUM PO Take 4 tablets by mouth daily.     . montelukast (SINGULAIR) 10 MG tablet TAKE 1 TABLET (10 MG TOTAL) BY MOUTH NIGHTLY.    . montelukast (SINGULAIR) 10 MG tablet TAKE 1 TABLET BY MOUTH NIGHTLY.    Marland Kitchen Olopatadine HCl (PATADAY) 0.2 % SOLN Place 1 drop into both eyes daily.    . Olopatadine HCl 0.2 % SOLN Administer 1 drop to both eyes Two (2) times a  day.    . Omega-3 Fatty Acids (OMEGA 3 PO) Take 3 capsules by mouth daily.    Marland Kitchen omeprazole (PRILOSEC) 40 MG capsule Take by mouth. Reported on 04/21/2015    . pregabalin (LYRICA) 150 MG capsule Take 1 capsule (150 mg total) by mouth 3 (three) times daily. Take 1 capsule in the morning and 2 capsules at night 90 capsule 5  . spironolactone (ALDACTONE) 25 MG tablet Take 1 tablet (25 mg total) by mouth 2 (two) times daily. 60 tablet 5  . traZODone (DESYREL)  150 MG tablet Take 1 tablet (150 mg total) by mouth at bedtime. 30 tablet 5  . vitamin B-12 (CYANOCOBALAMIN) 1000 MCG tablet Take 4,000 mcg by mouth daily.     No current facility-administered medications for this visit.     OBJECTIVE: There were no vitals taken for this visit.   There is no height or weight on file to calculate BMI.    ECOG FS:1 - Symptomatic but completely ambulatory  General: Well-developed, obese, no acute distress. Eyes: Pink conjunctiva, anicteric sclera. Lungs: Clear to auscultation bilaterally. Heart: Regular rate and rhythm. No rubs, murmurs, or gallops. Abdomen: Soft, nontender, nondistended. No organomegaly noted, normoactive bowel sounds. Musculoskeletal: No edema, cyanosis, or clubbing. Neuro: Alert, answering all questions appropriately. Cranial nerves grossly intact. Skin: No rashes or petechiae noted. Psych: Normal affect.  LAB RESULTS:  No visits with results within 3 Day(s) from this visit.  Latest known visit with results is:  Appointment on 08/30/2016  Component Date Value Ref Range Status  . b2a2 transcript 08/30/2016 Comment  % Final   Comment: (NOTE)           <0.0032 % (sensitivity limit of assay)   . b3a2 transcript 08/30/2016 0.1205  % Final  . E1A2 Transcript 08/30/2016 Comment  % Final   Comment: (NOTE)           <0.0032 % (sensitivity limit of assay)   . Interpretation (BCRAL): 08/30/2016 ABL1 e14a2 (b3a2, p210) fusion transcript.   Final   Comment: Comment POSITIVE for the BCR    . Director Review Adrian Saran): 08/30/2016 Comment   Final   Comment: (NOTE) Constance Goltz, PhD, [facility]               Director, [place] [facility] [place] [facility] [place], [place]   . Background: 08/30/2016 Comment   Corrected   Comment: (NOTE) This assay can detect three different types of BCR-ABL1 fusion transcripts associated with CML, ALL, and AML: e13a2 (previously b2a2) and e14a2 (previously b3a2) (major breakpoint, p210), as well as e1a2 (minor breakpoint, p190). The e13a2 and e14a2 transcript values are titrated to the current International Scale (IS). The standardized baseline is 100% BCR-ABL1 (IS) and major molecular response (MMR) is equivalent to 0.1% BCR-ABL1 (IS) corresponding to a 3-log reduction. Results should be correlated with appropriate clinical and laboratory information as indicated.   . Methodology 08/30/2016 Comment   Corrected   Comment: (NOTE) Total RNA is isolated from the sample and subject to a real-time, reverse transcriptase polymerase chain reaction (RT-PCR). The PCR primers and probes are specific for BCR-ABL1 e13a2, e14a2 and e1a2 fusion transcripts. The ABL1 transcript is amplified as the control for cDNA quantity and quality. Serial dilutions of a validated positive control RNA with known t(9;22) BCR-ABL1 are used as reference for quantification of BCR-ABL1 relative to ABL1. The numeric BCR-ABL1 level is reportd as % BCR-ABL1/ABL1 and the detection sensitivity is 4.5 log below the standard baseline. This test was developed and its performance characteristics determined by [facility]. It has not been cleared or approved by the Food and Drug Administration. References:    1. Anastasia Fiedler and [place] S: Seminars  in Hematology 2003;       40 (suppl2):62-68.    2. White HE, et al. Blood 2010; 116: e111-117.    3. NCCN Clinical Practice Guidelines in  Oncology, Chronic       Myeloid Leukemia. V2. 2017.                           Performed At: [facility] [address] [place], [place] [id number] Nechama Guard MD PY:[id number] Performed At: [facility] [place], [place] [id number] Nechama Guard MD PF:[id number]   . WBC 08/30/2016 5.9  3.6 - 11.0 K/uL Final  . RBC 08/30/2016 4.43  3.80 - 5.20 MIL/uL Final  . Hemoglobin 08/30/2016 12.3  12.0 - 16.0 g/dL Final  . HCT 08/30/2016 37.2  35.0 - 47.0 % Final  . MCV 08/30/2016 84.1  80.0 - 100.0 fL Final  . MCH 08/30/2016 27.7  26.0 - 34.0 pg Final  . MCHC 08/30/2016 32.9  32.0 - 36.0 g/dL Final  . RDW 08/30/2016 14.2  11.5 - 14.5 % Final  . Platelets 08/30/2016 221  150 - 440 K/uL Final  . Neutrophils Relative % 08/30/2016 73  % Final  . Neutro Abs 08/30/2016 4.4  1.4 - 6.5 K/uL Final  . Lymphocytes Relative 08/30/2016 16  % Final  . Lymphs Abs 08/30/2016 1.0  1.0 - 3.6 K/uL Final  . Monocytes Relative 08/30/2016 7  % Final  . Monocytes Absolute 08/30/2016 0.4  0.2 - 0.9 K/uL Final  . Eosinophils Relative 08/30/2016 3  % Final  . Eosinophils Absolute 08/30/2016 0.2  0 - 0.7 K/uL Final  . Basophils Relative 08/30/2016 1  % Final  . Basophils Absolute 08/30/2016 0.0  0 - 0.1 K/uL Final  . Sodium 08/30/2016 135  135 - 145 mmol/L Final  . Potassium 08/30/2016 4.0  3.5 - 5.1 mmol/L Final  . Chloride 08/30/2016 98* 101 - 111 mmol/L Final  . CO2 08/30/2016 28  22 - 32 mmol/L Final  . Glucose, Bld 08/30/2016 177* 65 - 99 mg/dL Final  . BUN 08/30/2016 23* 6 - 20 mg/dL Final  . Creatinine, Ser 08/30/2016 1.01* 0.44 - 1.00 mg/dL Final  . Calcium 08/30/2016 9.0  8.9 - 10.3 mg/dL Final  . Total Protein 08/30/2016 6.7  6.5 - 8.1 g/dL Final  . Albumin 08/30/2016 3.1* 3.5 - 5.0 g/dL Final  . AST 08/30/2016 38  15 - 41 U/L Final  . ALT 08/30/2016 37  14 - 54 U/L Final  . Alkaline Phosphatase 08/30/2016 58  38 - 126 U/L Final  . Total Bilirubin 08/30/2016 0.5   0.3 - 1.2 mg/dL Final  . GFR calc non Af Amer 08/30/2016 59* >60 mL/min Final  . GFR calc Af Amer 08/30/2016 >60  >60 mL/min Final   Comment: (NOTE) The eGFR has been calculated using the CKD EPI equation. This calculation has not been validated in all clinical situations. eGFR's persistently <60 mL/min signify possible Chronic Kidney Disease.   . Anion gap 08/30/2016 9  5 - 15 Final    STUDIES: No results found.  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 Non-compliance/SE-hypomag]; START BOSULIF [no Mutation analysis done] [474m]; May 2015- bcr-abl-Undetectable; 2096m[AE-LFTs] SEP 2015- bcr-abl- Increase 0.052%; 10065mnon-compliance] JAN 2016 bcr-abl Increased 5.456%; 300m44mune 2016- bcr-abl <0.001%; STOPPED Bosulif end  SEP 2016 [non-compliance];Sep 2016- B3 A2- 0.026% [increasing]; OCT 2016- MUTATION ANALYSIS- not done sec to lack of enough transcripts;  # NOV 1st Re-START BOLSULIF at 320m/d; JAN 2017- 2.44 [RISING]  PLAN:    1. CIVH:SJWTGRMStates she is currently taking 400 mg of Bosulif, but she changes her dose and stops it altogether then restarts without medical input. Despite this, her most recent BCR-ABL remains undetectable. Continue current dosing and return to clinic in 3 months for laboratory work and then in 6 months for laboratory work and further evaluation. It was previously offered to the patient for a referral for second opinion since her disease is difficult to control, but she has declined. 2. Pain: As per treatment of her primary care Physician.  Approximately 30 minutes was spent in discussion of which greater than 50% was consultation.  Patient expressed understanding and was in agreement with this plan. She also understands that She can call clinic at any time with any questions, concerns, or complaints.    TLloyd Huger MD    10/02/2016 11:02 PM

## 2016-10-04 ENCOUNTER — Inpatient Hospital Stay: Payer: Medicaid Other | Admitting: Oncology

## 2016-10-09 NOTE — Progress Notes (Signed)
[place]  [phone]  Fax:([phone]) [place] DOB: [birth year]  MR#: [id number]  XJO#:[id number]  Patient Care Team: [facility] as PCP - General  CHIEF COMPLAINT: CML  INTERVAL HISTORY: Patient returns to clinic for repeat laboratory work and further evaluation. She has again has missed several appointments in the interim. She has multiple complaints today that are all chronic in nature and unrelated to her CML or treatment. Patient states she is currently taking 400 mg Bosulif daily and has not missed any doses in the last month  Previously she would change her dose or stop it altogether then restart without medical input. She has a long history of noncompliance. She has no neurologic complaints. She denies any fevers, chills, night sweats, or weight loss. She has no chest pain or shortness of breath. She denies any nausea, vomiting, constipation, or diarrhea. She has no urinary complaints.  REVIEW OF SYSTEMS:   Review of Systems  Constitutional: Positive for malaise/fatigue. Negative for fever and weight loss.  Eyes: Negative.   Respiratory: Positive for shortness of breath. Negative for cough.   Cardiovascular: Negative.  Negative for chest pain and leg swelling.  Gastrointestinal: Negative.  Negative for abdominal pain.  Genitourinary: Negative.   Musculoskeletal: Positive for back pain, joint pain, myalgias and neck pain.  Skin: Positive for itching and rash.  Neurological: Positive for weakness.  Psychiatric/Behavioral: Negative.     As per HPI. Otherwise, a complete review of systems is negative.  ONCOLOGY HISTORY:  No history exists.    PAST MEDICAL HISTORY: Past Medical History:  Diagnosis Date  . CML (chronic myelocytic leukemia) ([place])   . COPD (chronic obstructive pulmonary disease) ([place])   . Depression   . DM type 2 (diabetes mellitus, type 2) ([place])   . Environmental allergies   . History of gastric ulcer     . HLD (hyperlipidemia)   . IDA (iron deficiency anemia)    chronic  . Leukemia, chronic myeloid ([place])   . Osteoarthritis   . Panic disorder   . PSS (progressive systemic sclerosis) ([place])   . Unspecified essential hypertension     PAST SURGICAL HISTORY: Past Surgical History:  Procedure Laterality Date  . back and neck surgery  1999, 2009   had rods placed  . [facility]) No blackages found.   . CARPAL TUNNEL RELEASE    . PARTIAL HYSTERECTOMY     age 66, no cancer    FAMILY HISTORY Family History  Problem Relation Age of Onset  . Diabetes Mother   . Hypertension Mother   . Heart disease Mother   . Prostate cancer Father   . Heart disease Father   . Diabetes Brother     GYNECOLOGIC HISTORY:  No LMP recorded. Patient has had a hysterectomy.     ADVANCED DIRECTIVES:    HEALTH MAINTENANCE: Social History  Substance Use Topics  . Smoking status: Never Smoker  . Smokeless tobacco: Never Used     Comment: quit 1985  . Alcohol use No    Allergies  Allergen Reactions  . Morphine Rash    Headache too  . Codeine Other (See Comments)    Reaction: Unknown   . Latex Other (See Comments)    Reaction:  Unknown   . Morphine And Related Other (See Comments)    Reaction:  Unknown   . Relafen [Nabumetone] Hives  . Tape Other (See  Comments) and Rash    Reaction:  Unknown     Current Outpatient Prescriptions  Medication Sig Dispense Refill  . albuterol (PROVENTIL HFA;VENTOLIN HFA) 108 (90 BASE) MCG/ACT inhaler Inhale 2 puffs into the lungs every 6 (six) hours as needed for wheezing or shortness of breath.    Marland Kitchen albuterol (PROVENTIL) (2.5 MG/3ML) 0.083% nebulizer solution INHALE CONTENTS OF 1 VIAL VIA NEBULIZER THREE TIMES A DAY prn    . ALPRAZolam (XANAX) 1 MG tablet Take 1 tablet (1 mg total) by mouth 3 (three) times daily. 90 tablet 5  . Ascorbic Acid (VITAMIN C) 1000 MG tablet Take 3,000 mg by mouth daily.    .  bosutinib (BOSULIF) 100 MG tablet Take 4 tablets (400 mg total) by mouth daily. 90 tablet 5  . celecoxib (CELEBREX) 200 MG capsule TAKE 1 CAPSULE (200 MG TOTAL) BY MOUTH TWO (2) TIMES A DAY.    . cetirizine (ZYRTEC) 10 MG tablet Take 20 mg by mouth daily.     . Cholecalciferol (VITAMIN D PO) Take 1 tablet by mouth daily.    Marland Kitchen co-enzyme Q-10 50 MG capsule Take 50 mg by mouth daily.    . cyanocobalamin (,VITAMIN B-12,) 1000 MCG/ML injection Inject 1,000 mcg into the muscle every 30 (thirty) days.    . diphenhydrAMINE (BENADRYL) 25 MG tablet Take 25 mg by mouth every 6 (six) hours as needed for itching or allergies.     . DULoxetine (CYMBALTA) 20 MG capsule Take 1 capsule (20 mg total) by mouth 3 (three) times daily. 90 capsule 5  . estradiol (ESTRACE) 2 MG tablet Take 2 mg by mouth daily.    . fluconazole (DIFLUCAN) 150 MG tablet     . fluticasone (FLONASE) 50 MCG/ACT nasal spray Place 1-2 sprays into both nostrils as needed for rhinitis.     . furosemide (LASIX) 40 MG tablet Take 1 tablet (40 mg total) by mouth daily. 30 tablet 3  . insulin aspart (NOVOLOG) 100 UNIT/ML injection Inject 15 Units into the skin 3 (three) times daily as needed for high blood sugar.     . insulin glargine (LANTUS) 100 UNIT/ML injection Inject 0.37 mLs (37 Units total) into the skin daily. 10 mL 11  . losartan (COZAAR) 100 MG tablet Take 100 mg by mouth daily.    Marland Kitchen MAGNESIUM PO Take 4 tablets by mouth daily.     . montelukast (SINGULAIR) 10 MG tablet TAKE 1 TABLET BY MOUTH NIGHTLY.    Marland Kitchen Olopatadine HCl (PATADAY) 0.2 % SOLN Place 1 drop into both eyes daily.    . Omega-3 Fatty Acids (OMEGA 3 PO) Take 3 capsules by mouth daily.    Marland Kitchen omeprazole (PRILOSEC) 40 MG capsule Take by mouth. Reported on 04/21/2015    . pregabalin (LYRICA) 150 MG capsule Take 1 capsule (150 mg total) by mouth 3 (three) times daily. Take 1 capsule in the morning and 2 capsules at night 90 capsule 5  . spironolactone (ALDACTONE) 25 MG tablet Take 1  tablet (25 mg total) by mouth 2 (two) times daily. 60 tablet 5  . traZODone (DESYREL) 150 MG tablet Take 1 tablet (150 mg total) by mouth at bedtime. 30 tablet 5  . vitamin B-12 (CYANOCOBALAMIN) 1000 MCG tablet Take 4,000 mcg by mouth daily.     No current facility-administered medications for this visit.     OBJECTIVE: BP (!) 161/96   Pulse 71   Temp (!) 96.4 F (35.8 C) (Tympanic)   Resp 20  Wt (!) 316 lb 2.2 oz (143.4 kg)   BMI 49.51 kg/m    Body mass index is 49.51 kg/m.    ECOG FS:1 - Symptomatic but completely ambulatory  General: Well-developed, obese, no acute distress. Eyes: Pink conjunctiva, anicteric sclera. Lungs: Clear to auscultation bilaterally. Heart: Regular rate and rhythm. No rubs, murmurs, or gallops. Abdomen: Soft, nontender, nondistended. No organomegaly noted, normoactive bowel sounds. Musculoskeletal: No edema, cyanosis, or clubbing. Neuro: Alert, answering all questions appropriately. Cranial nerves grossly intact. Skin: No rashes or petechiae noted. Psych: Normal affect.  LAB RESULTS:  No visits with results within 3 Day(s) from this visit.  Latest known visit with results is:  Appointment on 08/30/2016  Component Date Value Ref Range Status  . b2a2 transcript 08/30/2016 Comment  % Final   Comment: (NOTE)           <0.0032 % (sensitivity limit of assay)   . b3a2 transcript 08/30/2016 0.1205  % Final  . E1A2 Transcript 08/30/2016 Comment  % Final   Comment: (NOTE)           <0.0032 % (sensitivity limit of assay)   . Interpretation (BCRAL): 08/30/2016 ABL1 e14a2 (b3a2, p210) fusion transcript.   Final   Comment: Comment POSITIVE for the BCR   . Director Review Adrian Saran): 08/30/2016 Comment   Final   Comment: (NOTE) Constance Goltz, PhD, [facility]               Director, [place] for [place] and [place], [place]   .  Background: 08/30/2016 Comment   Corrected   Comment: (NOTE) This assay can detect three different types of BCR-ABL1 fusion transcripts associated with CML, ALL, and AML: e13a2 (previously b2a2) and e14a2 (previously b3a2) (major breakpoint, p210), as well as e1a2 (minor breakpoint, p190). The e13a2 and e14a2 transcript values are titrated to the current International Scale (IS). The standardized baseline is 100% BCR-ABL1 (IS) and major molecular response (MMR) is equivalent to 0.1% BCR-ABL1 (IS) corresponding to a 3-log reduction. Results should be correlated with appropriate clinical and laboratory information as indicated.   . Methodology 08/30/2016 Comment   Corrected   Comment: (NOTE) Total RNA is isolated from the sample and subject to a real-time, reverse transcriptase polymerase chain reaction (RT-PCR). The PCR primers and probes are specific for BCR-ABL1 e13a2, e14a2 and e1a2 fusion transcripts. The ABL1 transcript is amplified as the control for cDNA quantity and quality. Serial dilutions of a validated positive control RNA with known t(9;22) BCR-ABL1 are used as reference for quantification of BCR-ABL1 relative to ABL1. The numeric BCR-ABL1 level is reportd as % BCR-ABL1/ABL1 and the detection sensitivity is 4.5 log below the standard baseline. This test was developed and its performance characteristics determined by [facility]. It has not been cleared or approved by the Food and Drug Administration. References:    1. Anastasia Fiedler and Branford S: Seminars in Hematology 2003;       40 (suppl2):62-68.    2. White HE, et al. Blood 2010; 116: e111-117.    3. NCCN Clinical Practice Guidelines in Oncology, Chronic       Myeloid Leukemia. V2. 2017.  Performed At: [facility] [address] [place], [place] [id number] Nechama Guard MD ZJ:[id number] Performed At: [facility] [place], [place] [id number] Nechama Guard MD  ID:[id number]   . WBC 08/30/2016 5.9  3.6 - 11.0 K/uL Final  . RBC 08/30/2016 4.43  3.80 - 5.20 MIL/uL Final  . Hemoglobin 08/30/2016 12.3  12.0 - 16.0 g/dL Final  . HCT 08/30/2016 37.2  35.0 - 47.0 % Final  . MCV 08/30/2016 84.1  80.0 - 100.0 fL Final  . MCH 08/30/2016 27.7  26.0 - 34.0 pg Final  . MCHC 08/30/2016 32.9  32.0 - 36.0 g/dL Final  . RDW 08/30/2016 14.2  11.5 - 14.5 % Final  . Platelets 08/30/2016 221  150 - 440 K/uL Final  . Neutrophils Relative % 08/30/2016 73  % Final  . Neutro Abs 08/30/2016 4.4  1.4 - 6.5 K/uL Final  . Lymphocytes Relative 08/30/2016 16  % Final  . Lymphs Abs 08/30/2016 1.0  1.0 - 3.6 K/uL Final  . Monocytes Relative 08/30/2016 7  % Final  . Monocytes Absolute 08/30/2016 0.4  0.2 - 0.9 K/uL Final  . Eosinophils Relative 08/30/2016 3  % Final  . Eosinophils Absolute 08/30/2016 0.2  0 - 0.7 K/uL Final  . Basophils Relative 08/30/2016 1  % Final  . Basophils Absolute 08/30/2016 0.0  0 - 0.1 K/uL Final  . Sodium 08/30/2016 135  135 - 145 mmol/L Final  . Potassium 08/30/2016 4.0  3.5 - 5.1 mmol/L Final  . Chloride 08/30/2016 98* 101 - 111 mmol/L Final  . CO2 08/30/2016 28  22 - 32 mmol/L Final  . Glucose, Bld 08/30/2016 177* 65 - 99 mg/dL Final  . BUN 08/30/2016 23* 6 - 20 mg/dL Final  . Creatinine, Ser 08/30/2016 1.01* 0.44 - 1.00 mg/dL Final  . Calcium 08/30/2016 9.0  8.9 - 10.3 mg/dL Final  . Total Protein 08/30/2016 6.7  6.5 - 8.1 g/dL Final  . Albumin 08/30/2016 3.1* 3.5 - 5.0 g/dL Final  . AST 08/30/2016 38  15 - 41 U/L Final  . ALT 08/30/2016 37  14 - 54 U/L Final  . Alkaline Phosphatase 08/30/2016 58  38 - 126 U/L Final  . Total Bilirubin 08/30/2016 0.5  0.3 - 1.2 mg/dL Final  . GFR calc non Af Amer 08/30/2016 59* >60 mL/min Final  . GFR calc Af Amer 08/30/2016 >60  >60 mL/min Final   Comment: (NOTE) The eGFR has been calculated using the CKD EPI equation. This calculation has not been validated in all clinical situations. eGFR's  persistently <60 mL/min signify possible Chronic Kidney Disease.   . Anion gap 08/30/2016 9  5 - 15 Final    STUDIES: No results found.  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 Non-compliance/SE-hypomag]; START BOSULIF [no Mutation analysis done] [437m]; May 2015- bcr-abl-Undetectable; 2033m[AE-LFTs] SEP 2015- bcr-abl- Increase 0.052%; 10022mnon-compliance] JAN 2016 bcr-abl Increased 5.456%; 300m95mune 2016- bcr-abl <0.001%; STOPPED Bosulif end SEP 2016 [non-compliance];Sep 2016- B3 A2- 0.026% [increasing]; OCT 2016- MUTATION ANALYSIS- not done sec to lack of enough transcripts;  # NOV 1st Re-START BOLSULIF at 300mg5mJAN 2017- 2.44 [RISING]  PLAN:    1. CML:PWER:XVQMGQQes she is currently taking 400 mg of Bosulif. Her most recent BCR-ABL is increasing. Patient refuses a referral for a second opinion and does not wish to change medications at  this point. Continue current dosing and return to clinic in 3 months for laboratory work only.  If the BCR-ABL continues to be elevated or increases, she has agreed to referral at that time. If it returns to undetectable like previous, will continue current treatment as planned.  2. Pain: As per treatment of her primary care Physician.  Approximately 30 minutes was spent in discussion of which greater than 50% was consultation.  Patient expressed understanding and was in agreement with this plan. She also understands that She can call clinic at any time with any questions, concerns, or complaints.    Lloyd Huger, MD   10/11/2016 2:48 PM

## 2016-10-11 ENCOUNTER — Inpatient Hospital Stay: Payer: Medicaid Other | Attending: Oncology | Admitting: Oncology

## 2016-10-11 VITALS — BP 161/96 | HR 71 | Temp 96.4°F | Resp 20 | Wt 316.1 lb

## 2016-10-11 DIAGNOSIS — J449 Chronic obstructive pulmonary disease, unspecified: Secondary | ICD-10-CM | POA: Diagnosis not present

## 2016-10-11 DIAGNOSIS — M1990 Unspecified osteoarthritis, unspecified site: Secondary | ICD-10-CM | POA: Insufficient documentation

## 2016-10-11 DIAGNOSIS — E785 Hyperlipidemia, unspecified: Secondary | ICD-10-CM | POA: Diagnosis not present

## 2016-10-11 DIAGNOSIS — Z9114 Patient's other noncompliance with medication regimen: Secondary | ICD-10-CM | POA: Insufficient documentation

## 2016-10-11 DIAGNOSIS — E119 Type 2 diabetes mellitus without complications: Secondary | ICD-10-CM | POA: Insufficient documentation

## 2016-10-11 DIAGNOSIS — Z9119 Patient's noncompliance with other medical treatment and regimen: Secondary | ICD-10-CM | POA: Insufficient documentation

## 2016-10-11 DIAGNOSIS — F329 Major depressive disorder, single episode, unspecified: Secondary | ICD-10-CM | POA: Diagnosis not present

## 2016-10-11 DIAGNOSIS — Z794 Long term (current) use of insulin: Secondary | ICD-10-CM | POA: Insufficient documentation

## 2016-10-11 DIAGNOSIS — E8770 Fluid overload, unspecified: Secondary | ICD-10-CM | POA: Insufficient documentation

## 2016-10-11 DIAGNOSIS — C9210 Chronic myeloid leukemia, BCR/ABL-positive, not having achieved remission: Secondary | ICD-10-CM

## 2016-10-11 DIAGNOSIS — I10 Essential (primary) hypertension: Secondary | ICD-10-CM | POA: Insufficient documentation

## 2016-10-11 DIAGNOSIS — Z79899 Other long term (current) drug therapy: Secondary | ICD-10-CM | POA: Insufficient documentation

## 2016-10-11 NOTE — Progress Notes (Signed)
Patient here today for follow up regarding CML. Patient reports hip pain and headaches today.

## 2016-11-22 ENCOUNTER — Other Ambulatory Visit: Payer: Medicaid Other

## 2016-11-29 ENCOUNTER — Ambulatory Visit: Payer: Medicaid Other | Admitting: Oncology

## 2016-12-17 ENCOUNTER — Other Ambulatory Visit: Payer: Self-pay | Admitting: *Deleted

## 2016-12-17 ENCOUNTER — Telehealth: Payer: Self-pay | Admitting: Pharmacist

## 2016-12-17 DIAGNOSIS — C9210 Chronic myeloid leukemia, BCR/ABL-positive, not having achieved remission: Secondary | ICD-10-CM

## 2016-12-17 MED ORDER — BOSUTINIB 100 MG PO TABS: 400.0000 mg | ORAL_TABLET | Freq: Every day | ORAL | 5 refills | Status: DC

## 2016-12-17 NOTE — Telephone Encounter (Signed)
Oral Chemotherapy Pharmacist Encounter  [facility] patient advocate called patient and offered to switch her refill prescription to [facility]. Patient would like to keep her prescription with [facility]. I let Tillie Rung, RN know to send refill prescriptions to [facility] per patient request.   Darl Pikes, PharmD, BCPS Hematology/Oncology Clinical Pharmacist [facility] [phone]  12/17/2016 4:02 PM

## 2017-01-09 ENCOUNTER — Other Ambulatory Visit: Payer: Self-pay

## 2017-01-09 DIAGNOSIS — C9210 Chronic myeloid leukemia, BCR/ABL-positive, not having achieved remission: Secondary | ICD-10-CM

## 2017-01-10 ENCOUNTER — Inpatient Hospital Stay: Payer: Medicaid Other | Attending: Oncology

## 2017-01-10 ENCOUNTER — Other Ambulatory Visit: Payer: Self-pay

## 2017-01-10 DIAGNOSIS — M1990 Unspecified osteoarthritis, unspecified site: Secondary | ICD-10-CM | POA: Insufficient documentation

## 2017-01-10 DIAGNOSIS — Z9114 Patient's other noncompliance with medication regimen: Secondary | ICD-10-CM | POA: Insufficient documentation

## 2017-01-10 DIAGNOSIS — J449 Chronic obstructive pulmonary disease, unspecified: Secondary | ICD-10-CM | POA: Insufficient documentation

## 2017-01-10 DIAGNOSIS — Z79899 Other long term (current) drug therapy: Secondary | ICD-10-CM | POA: Insufficient documentation

## 2017-01-10 DIAGNOSIS — E785 Hyperlipidemia, unspecified: Secondary | ICD-10-CM | POA: Diagnosis not present

## 2017-01-10 DIAGNOSIS — Z9119 Patient's noncompliance with other medical treatment and regimen: Secondary | ICD-10-CM | POA: Insufficient documentation

## 2017-01-10 DIAGNOSIS — E119 Type 2 diabetes mellitus without complications: Secondary | ICD-10-CM | POA: Diagnosis not present

## 2017-01-10 DIAGNOSIS — C9210 Chronic myeloid leukemia, BCR/ABL-positive, not having achieved remission: Secondary | ICD-10-CM | POA: Insufficient documentation

## 2017-01-10 DIAGNOSIS — F329 Major depressive disorder, single episode, unspecified: Secondary | ICD-10-CM | POA: Diagnosis not present

## 2017-01-10 DIAGNOSIS — E8770 Fluid overload, unspecified: Secondary | ICD-10-CM | POA: Insufficient documentation

## 2017-01-10 DIAGNOSIS — Z794 Long term (current) use of insulin: Secondary | ICD-10-CM | POA: Diagnosis not present

## 2017-01-10 DIAGNOSIS — I10 Essential (primary) hypertension: Secondary | ICD-10-CM | POA: Insufficient documentation

## 2017-01-10 LAB — CBC WITH DIFFERENTIAL/PLATELET
BASOS ABS: 0 10*3/uL (ref 0–0.1)
BASOS PCT: 1 %
Eosinophils Absolute: 0.2 10*3/uL (ref 0–0.7)
Eosinophils Relative: 3 %
HCT: 41.5 % (ref 35.0–47.0)
Hemoglobin: 13.9 g/dL (ref 12.0–16.0)
Lymphocytes Relative: 20 %
Lymphs Abs: 1.3 10*3/uL (ref 1.0–3.6)
MCH: 28.3 pg (ref 26.0–34.0)
MCHC: 33.5 g/dL (ref 32.0–36.0)
MCV: 84.5 fL (ref 80.0–100.0)
Monocytes Absolute: 0.4 10*3/uL (ref 0.2–0.9)
Monocytes Relative: 7 %
Neutro Abs: 4.5 10*3/uL (ref 1.4–6.5)
Neutrophils Relative %: 69 %
PLATELETS: 267 10*3/uL (ref 150–440)
RBC: 4.91 MIL/uL (ref 3.80–5.20)
RDW: 14.3 % (ref 11.5–14.5)
WBC: 6.5 10*3/uL (ref 3.6–11.0)

## 2017-01-10 LAB — COMPREHENSIVE METABOLIC PANEL
ALBUMIN: 3.8 g/dL (ref 3.5–5.0)
ALK PHOS: 59 U/L (ref 38–126)
ALT: 34 U/L (ref 14–54)
ANION GAP: 10 (ref 5–15)
AST: 38 U/L (ref 15–41)
BILIRUBIN TOTAL: 0.5 mg/dL (ref 0.3–1.2)
BUN: 15 mg/dL (ref 6–20)
CALCIUM: 9.5 mg/dL (ref 8.9–10.3)
CO2: 24 mmol/L (ref 22–32)
Chloride: 100 mmol/L — ABNORMAL LOW (ref 101–111)
Creatinine, Ser: 0.93 mg/dL (ref 0.44–1.00)
GFR calc Af Amer: >60 mL/min (ref >60)
GLUCOSE: 187 mg/dL — AB (ref 65–99)
POTASSIUM: 3.7 mmol/L (ref 3.5–5.1)
Sodium: 134 mmol/L — ABNORMAL LOW (ref 135–145)
TOTAL PROTEIN: 7.4 g/dL (ref 6.5–8.1)

## 2017-01-17 LAB — BCR-ABL1, CML/ALL, PCR, QUANT

## 2017-01-21 NOTE — Progress Notes (Deleted)
[place]  [phone]  [phone]) [phone]     Tara Levine DOB: 02-10-1956  MR#: [id number]  KCL#:[id number]  Patient Care Team: [facility] as PCP - General  CHIEF COMPLAINT: CML  INTERVAL HISTORY: Patient returns to clinic for repeat laboratory work and further evaluation. She has again has missed several appointments in the interim. She has multiple complaints today that are all chronic in nature and unrelated to her CML or treatment. Patient states she is currently taking 400 mg Bosulif daily and has not missed any doses in the last month  Previously she would change her dose or stop it altogether then restart without medical input. She has a long history of noncompliance. She has no neurologic complaints. She denies any fevers, chills, night sweats, or weight loss. She has no chest pain or shortness of breath. She denies any nausea, vomiting, constipation, or diarrhea. She has no urinary complaints.  REVIEW OF SYSTEMS:   Review of Systems  Constitutional: Positive for malaise/fatigue. Negative for fever and weight loss.  Eyes: Negative.   Respiratory: Positive for shortness of breath. Negative for cough.   Cardiovascular: Negative.  Negative for chest pain and leg swelling.  Gastrointestinal: Negative.  Negative for abdominal pain.  Genitourinary: Negative.   Musculoskeletal: Positive for back pain, joint pain, myalgias and neck pain.  Skin: Positive for itching and rash.  Neurological: Positive for weakness.  Psychiatric/Behavioral: Negative.     As per HPI. Otherwise, a complete review of systems is negative.  ONCOLOGY HISTORY:  No history exists.    PAST MEDICAL HISTORY: Past Medical History:  Diagnosis Date  . CML (chronic myelocytic leukemia) ([place])   . COPD (chronic obstructive pulmonary disease) ([place])   . Depression   . DM type 2 (diabetes mellitus, type 2) ([place])   . Environmental allergies   . History of gastric ulcer     . HLD (hyperlipidemia)   . IDA (iron deficiency anemia)    chronic  . Leukemia, chronic myeloid ([place])   . Osteoarthritis   . Panic disorder   . PSS (progressive systemic sclerosis) ([place])   . Unspecified essential hypertension     PAST SURGICAL HISTORY: Past Surgical History:  Procedure Laterality Date  . back and neck surgery  1999, 2009   had rods placed  . [facility]) No blackages found.   . CARPAL TUNNEL RELEASE    . PARTIAL HYSTERECTOMY     age 69, no cancer    FAMILY HISTORY Family History  Problem Relation Age of Onset  . Diabetes Mother   . Hypertension Mother   . Heart disease Mother   . Prostate cancer Father   . Heart disease Father   . Diabetes Brother     GYNECOLOGIC HISTORY:  No LMP recorded. Patient has had a hysterectomy.     ADVANCED DIRECTIVES:    HEALTH MAINTENANCE: Social History   Tobacco Use  . Smoking status: Never Smoker  . Smokeless tobacco: Never Used  . Tobacco comment: quit 1985  Substance Use Topics  . Alcohol use: No    Alcohol/week: 0.0 oz  . Drug use: No    Allergies  Allergen Reactions  . Morphine Rash    Headache too  . Codeine Other (See Comments)    Reaction: Unknown   . Latex Other (See Comments)    Reaction:  Unknown   . Morphine And Related Other (See Comments)  Reaction:  Unknown   . Relafen [Nabumetone] Hives  . Tape Other (See Comments) and Rash    Reaction:  Unknown     Current Outpatient Medications  Medication Sig Dispense Refill  . albuterol (PROVENTIL HFA;VENTOLIN HFA) 108 (90 BASE) MCG/ACT inhaler Inhale 2 puffs into the lungs every 6 (six) hours as needed for wheezing or shortness of breath.    Marland Kitchen albuterol (PROVENTIL) (2.5 MG/3ML) 0.083% nebulizer solution INHALE CONTENTS OF 1 VIAL VIA NEBULIZER THREE TIMES A DAY prn    . ALPRAZolam (XANAX) 1 MG tablet Take 1 tablet (1 mg total) by mouth 3 (three) times daily. 90 tablet 5  . Ascorbic Acid  (VITAMIN C) 1000 MG tablet Take 3,000 mg by mouth daily.    . bosutinib (BOSULIF) 100 MG tablet Take 4 tablets (400 mg total) by mouth daily. 90 tablet 5  . celecoxib (CELEBREX) 200 MG capsule TAKE 1 CAPSULE (200 MG TOTAL) BY MOUTH TWO (2) TIMES A DAY.    . cetirizine (ZYRTEC) 10 MG tablet Take 20 mg by mouth daily.     . Cholecalciferol (VITAMIN D PO) Take 1 tablet by mouth daily.    Marland Kitchen co-enzyme Q-10 50 MG capsule Take 50 mg by mouth daily.    . cyanocobalamin (,VITAMIN B-12,) 1000 MCG/ML injection Inject 1,000 mcg into the muscle every 30 (thirty) days.    . diphenhydrAMINE (BENADRYL) 25 MG tablet Take 25 mg by mouth every 6 (six) hours as needed for itching or allergies.     . DULoxetine (CYMBALTA) 20 MG capsule Take 1 capsule (20 mg total) by mouth 3 (three) times daily. 90 capsule 5  . estradiol (ESTRACE) 2 MG tablet Take 2 mg by mouth daily.    . fluconazole (DIFLUCAN) 150 MG tablet     . fluticasone (FLONASE) 50 MCG/ACT nasal spray Place 1-2 sprays into both nostrils as needed for rhinitis.     . furosemide (LASIX) 40 MG tablet Take 1 tablet (40 mg total) by mouth daily. 30 tablet 3  . insulin aspart (NOVOLOG) 100 UNIT/ML injection Inject 15 Units into the skin 3 (three) times daily as needed for high blood sugar.     . insulin glargine (LANTUS) 100 UNIT/ML injection Inject 0.37 mLs (37 Units total) into the skin daily. 10 mL 11  . losartan (COZAAR) 100 MG tablet Take 100 mg by mouth daily.    Marland Kitchen MAGNESIUM PO Take 4 tablets by mouth daily.     . montelukast (SINGULAIR) 10 MG tablet TAKE 1 TABLET BY MOUTH NIGHTLY.    Marland Kitchen Olopatadine HCl (PATADAY) 0.2 % SOLN Place 1 drop into both eyes daily.    . Omega-3 Fatty Acids (OMEGA 3 PO) Take 3 capsules by mouth daily.    Marland Kitchen omeprazole (PRILOSEC) 40 MG capsule Take by mouth. Reported on 04/21/2015    . pregabalin (LYRICA) 150 MG capsule Take 1 capsule (150 mg total) by mouth 3 (three) times daily. Take 1 capsule in the morning and 2 capsules at night 90  capsule 5  . spironolactone (ALDACTONE) 25 MG tablet Take 1 tablet (25 mg total) by mouth 2 (two) times daily. 60 tablet 5  . traZODone (DESYREL) 150 MG tablet Take 1 tablet (150 mg total) by mouth at bedtime. 30 tablet 5  . vitamin B-12 (CYANOCOBALAMIN) 1000 MCG tablet Take 4,000 mcg by mouth daily.     No current facility-administered medications for this visit.     OBJECTIVE: There were no vitals taken for this visit.  There is no height or weight on file to calculate BMI.    ECOG FS:1 - Symptomatic but completely ambulatory  General: Well-developed, obese, no acute distress. Eyes: Pink conjunctiva, anicteric sclera. Lungs: Clear to auscultation bilaterally. Heart: Regular rate and rhythm. No rubs, murmurs, or gallops. Abdomen: Soft, nontender, nondistended. No organomegaly noted, normoactive bowel sounds. Musculoskeletal: No edema, cyanosis, or clubbing. Neuro: Alert, answering all questions appropriately. Cranial nerves grossly intact. Skin: No rashes or petechiae noted. Psych: Normal affect.  LAB RESULTS:  No visits with results within 3 Day(s) from this visit.  Latest known visit with results is:  Orders Only on 01/10/2017  Component Date Value Ref Range Status  . b2a2 transcript 01/10/2017 Comment  % Final   Comment: (NOTE)           <0.0032 % (sensitivity limit of assay)   . b3a2 transcript 01/10/2017 Comment  % Final   Comment: (NOTE)           <0.0032 % (sensitivity limit of assay)   . E1A2 Transcript 01/10/2017 Comment  % Final   Comment: (NOTE)           <0.0032 % (sensitivity limit of assay)   . Interpretation (BCRAL): 01/10/2017 Comment   Final   Comment: (NOTE) NEGATIVE for the BCR-ABL1 e1a2 (p190), e13a2 (b2a2, p210) and e14a2 (b3a2, p210) fusion transcripts. These results do not rule out the presence of rare BCR-ABL1 transcripts not detected by this assay.   . Director Review [facility]): 01/10/2017 Comment   Final   Comment: (NOTE) Constance Goltz, PhD,  [facility]               Director, [place] [facility] [place] [facility] [place], [place]   . Background: 01/10/2017 Comment   Corrected   Comment: (NOTE) This assay can detect three different types of BCR-ABL1 fusion transcripts associated with CML, ALL, and AML: e13a2 (previously b2a2) and e14a2 (previously b3a2) (major breakpoint, p210), as well as e1a2 (minor breakpoint, p190). The e13a2 and e14a2 transcript values are titrated to the current International Scale (IS). The standardized baseline is 100% BCR-ABL1 (IS) and major molecular response (MMR) is equivalent to 0.1% BCR-ABL1 (IS) corresponding to a 3-log reduction. Results should be correlated with appropriate clinical and laboratory information as indicated.   . Methodology 01/10/2017 Comment   Corrected   Comment: (NOTE) Total RNA is isolated from the sample and subject to a real-time, reverse transcriptase polymerase chain reaction (RT-PCR). The PCR primers and probes are specific for BCR-ABL1 e13a2, e14a2 and e1a2 fusion transcripts. The ABL1 transcript is amplified as the control for cDNA quantity and quality. Serial dilutions of a validated positive control RNA with known t(9;22) BCR-ABL1 are used as reference for quantification of BCR-ABL1 relative to ABL1. The numeric BCR-ABL1 level is reportd as % BCR-ABL1/ABL1 and the detection sensitivity is 4.5 log below the standard baseline. This test was developed and its performance characteristics determined by [facility]. It has not been cleared or approved by the Food and Drug Administration. References:    1. Anastasia Fiedler and Branford S: Seminars in Hematology 2003;       40 (suppl2):62-68.  2. White HE, et al. Blood 2010; 116: e111-117.    3. NCCN Clinical Practice Guidelines in Oncology, Chronic       Myeloid Leukemia. V2. 2017.                            Performed At: [facility] [address] [place], [place] [id number] Nechama Guard MD SF:[id number] Performed At: [facility] [place], [place] [id number] Nechama Guard MD RF:[id number]   . Sodium 01/10/2017 134* 135 - 145 mmol/L Final  . Potassium 01/10/2017 3.7  3.5 - 5.1 mmol/L Final  . Chloride 01/10/2017 100* 101 - 111 mmol/L Final  . CO2 01/10/2017 24  22 - 32 mmol/L Final  . Glucose, Bld 01/10/2017 187* 65 - 99 mg/dL Final  . BUN 01/10/2017 15  6 - 20 mg/dL Final  . Creatinine, Ser 01/10/2017 0.93  0.44 - 1.00 mg/dL Final  . Calcium 01/10/2017 9.5  8.9 - 10.3 mg/dL Final  . Total Protein 01/10/2017 7.4  6.5 - 8.1 g/dL Final  . Albumin 01/10/2017 3.8  3.5 - 5.0 g/dL Final  . AST 01/10/2017 38  15 - 41 U/L Final  . ALT 01/10/2017 34  14 - 54 U/L Final  . Alkaline Phosphatase 01/10/2017 59  38 - 126 U/L Final  . Total Bilirubin 01/10/2017 0.5  0.3 - 1.2 mg/dL Final  . GFR calc non Af Amer 01/10/2017 >60  >60 mL/min Final  . GFR calc Af Amer 01/10/2017 >60  >60 mL/min Final   Comment: (NOTE) The eGFR has been calculated using the CKD EPI equation. This calculation has not been validated in all clinical situations. eGFR's persistently <60 mL/min signify possible Chronic Kidney Disease.   . Anion gap 01/10/2017 10  5 - 15 Final  . WBC 01/10/2017 6.5  3.6 - 11.0 K/uL Final  . RBC 01/10/2017 4.91  3.80 - 5.20 MIL/uL Final  . Hemoglobin 01/10/2017 13.9  12.0 - 16.0 g/dL Final  . HCT 01/10/2017 41.5  35.0 - 47.0 % Final  . MCV 01/10/2017 84.5  80.0 - 100.0 fL Final  . MCH 01/10/2017 28.3  26.0 - 34.0 pg Final  . MCHC 01/10/2017 33.5  32.0 - 36.0 g/dL Final  . RDW 01/10/2017 14.3  11.5 - 14.5 % Final  . Platelets 01/10/2017 267  150 - 440 K/uL Final  . Neutrophils Relative % 01/10/2017 69  % Final  . Neutro Abs 01/10/2017 4.5  1.4 - 6.5 K/uL Final  . Lymphocytes Relative 01/10/2017 20  % Final  . Lymphs Abs 01/10/2017 1.3  1.0 - 3.6 K/uL  Final  . Monocytes Relative 01/10/2017 7  % Final  . Monocytes Absolute 01/10/2017 0.4  0.2 - 0.9 K/uL Final  . Eosinophils Relative 01/10/2017 3  % Final  . Eosinophils Absolute 01/10/2017 0.2  0 - 0.7 K/uL Final  . Basophils Relative 01/10/2017 1  % Final  . Basophils Absolute 01/10/2017 0.0  0 - 0.1 K/uL Final    STUDIES: No results found.  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 Non-compliance/SE-hypomag]; START BOSULIF [no Mutation analysis done] [48m]; May 2015- bcr-abl-Undetectable; 2025m[AE-LFTs] SEP 2015- bcr-abl- Increase 0.052%; 10052mnon-compliance] JAN 2016 bcr-abl Increased 5.456%; 300m77mune 2016- bcr-abl <0.001%; STOPPED Bosulif end SEP 2016 [non-compliance];Sep 2016- B3 A2- 0.026% [increasing]; OCT 2016- MUTATION  ANALYSIS- not done sec to lack of enough transcripts;  # NOV 1st Re-START BOLSULIF at 377m/d; JAN 2017- 2.44 [RISING]  PLAN:    1. CIOX:BDZHGDJStates she is currently taking 400 mg of Bosulif. Her most recent BCR-ABL is increasing. Patient refuses a referral for a second opinion and does not wish to change medications at this point. Continue current dosing and return to clinic in 3 months for laboratory work only.  If the BCR-ABL continues to be elevated or increases, she has agreed to referral at that time. If it returns to undetectable like previous, will continue current treatment as planned.  2. Pain: As per treatment of her primary care Physician.  Approximately 30 minutes was spent in discussion of which greater than 50% was consultation.  Patient expressed understanding and was in agreement with this plan. She also understands that She can call clinic at any time with any questions, concerns, or complaints.    TLloyd Huger MD   01/21/2017 11:21 PM

## 2017-01-24 ENCOUNTER — Inpatient Hospital Stay: Payer: Medicaid Other | Admitting: Oncology

## 2017-01-31 ENCOUNTER — Ambulatory Visit: Payer: Medicaid Other | Admitting: Oncology

## 2017-03-24 ENCOUNTER — Other Ambulatory Visit: Payer: Self-pay | Admitting: Psychiatry

## 2017-03-24 ENCOUNTER — Telehealth: Payer: Self-pay

## 2017-03-24 NOTE — Telephone Encounter (Signed)
Pt called left a message that she needs a refill on her lyrica states that the pharamcy has been trying to fax over a request      Disp Refills Start End   pregabalin (LYRICA) 150 MG capsule 90 capsule 5 09/23/2016    Sig - Route: Take 1 capsule (150 mg total) by mouth 3 (three) times daily. Take 1 capsule in the morning and 2 capsules at night - Oral   Class: Phone In

## 2017-03-24 NOTE — Telephone Encounter (Signed)
I am glad to do it.  It looks like she has a appointment scheduled for tomorrow anyway.

## 2017-03-25 ENCOUNTER — Other Ambulatory Visit: Payer: Self-pay

## 2017-03-25 ENCOUNTER — Encounter: Payer: Self-pay | Admitting: Psychiatry

## 2017-03-25 ENCOUNTER — Ambulatory Visit (INDEPENDENT_AMBULATORY_CARE_PROVIDER_SITE_OTHER): Payer: Medicaid Other | Admitting: Psychiatry

## 2017-03-25 VITALS — BP 168/92 | HR 86 | Temp 98.3°F | Wt 305.0 lb

## 2017-03-25 DIAGNOSIS — F4310 Post-traumatic stress disorder, unspecified: Secondary | ICD-10-CM

## 2017-03-25 DIAGNOSIS — F3130 Bipolar disorder, current episode depressed, mild or moderate severity, unspecified: Secondary | ICD-10-CM | POA: Diagnosis not present

## 2017-03-25 MED ORDER — PREGABALIN 150 MG PO CAPS: 150.0000 mg | ORAL_CAPSULE | Freq: Two times a day (BID) | ORAL | 5 refills | Status: DC

## 2017-03-25 MED ORDER — TRAZODONE HCL 150 MG PO TABS: 150.0000 mg | ORAL_TABLET | Freq: Every day | ORAL | 5 refills | Status: DC

## 2017-03-25 MED ORDER — ALPRAZOLAM 1 MG PO TABS: 1.0000 mg | ORAL_TABLET | Freq: Three times a day (TID) | ORAL | 5 refills | Status: DC

## 2017-03-25 MED ORDER — DULOXETINE HCL 20 MG PO CPEP: 20.0000 mg | ORAL_CAPSULE | Freq: Three times a day (TID) | ORAL | 5 refills | Status: DC

## 2017-03-25 NOTE — Progress Notes (Signed)
Follow-up for 62 year old woman with chronic mood disorder and anxiety.  She is complaining of pain being worse also feel hot and cold flashes, being run down and exhausted.  She has been out of her Lyrica for several days because apparently the pharmacy could not fill the 3 times a day prescription without an override.  Also, she has not been fully compliant with the recommended treatment for her cancer.  She complains about Dr. Woodfin Ganja again but reviewing his note it looks like she had been recommended to go to [facility] but had declined the suggestion and also had not followed up with her outpatient appointment to recheck some of her blood tests.  Patient is neatly dressed and groomed alert and oriented dysphoric affect lucid however no suicidal strongly encourage patient to make an appointment immediately with her oncologist again.  I am concerned that her multiple physical complaints could be related to her noncompliance with her treatment.  Also I will renew her prescriptions and renew the Lyrica at 150 mg twice a day.  I will try to follow up with the prior authorization to see if we can get the 3 times a day approved but if not she should fill it at the older dosage.  Follow-up in 6 months.

## 2017-03-27 NOTE — Telephone Encounter (Signed)
pt called left message that her medicaid will not pay for lyrica and that she needs a prior auth.  prior auth was rejected.

## 2017-04-03 NOTE — Telephone Encounter (Signed)
lyrica was approved pa # [id number]

## 2017-04-16 DIAGNOSIS — B9689 Other specified bacterial agents as the cause of diseases classified elsewhere: Secondary | ICD-10-CM | POA: Insufficient documentation

## 2017-04-16 DIAGNOSIS — J019 Acute sinusitis, unspecified: Secondary | ICD-10-CM

## 2017-06-20 DIAGNOSIS — Z9181 History of falling: Secondary | ICD-10-CM | POA: Insufficient documentation

## 2017-07-10 ENCOUNTER — Encounter: Payer: Self-pay | Admitting: Nurse Practitioner

## 2017-09-22 ENCOUNTER — Ambulatory Visit (INDEPENDENT_AMBULATORY_CARE_PROVIDER_SITE_OTHER): Payer: Medicaid Other | Admitting: Psychiatry

## 2017-09-22 ENCOUNTER — Other Ambulatory Visit: Payer: Self-pay | Admitting: Psychiatry

## 2017-09-22 ENCOUNTER — Encounter: Payer: Self-pay | Admitting: Psychiatry

## 2017-09-22 ENCOUNTER — Other Ambulatory Visit: Payer: Self-pay

## 2017-09-22 VITALS — BP 150/85 | HR 76 | Temp 97.9°F | Wt 299.8 lb

## 2017-09-22 DIAGNOSIS — F4310 Post-traumatic stress disorder, unspecified: Secondary | ICD-10-CM

## 2017-09-22 DIAGNOSIS — F3130 Bipolar disorder, current episode depressed, mild or moderate severity, unspecified: Secondary | ICD-10-CM | POA: Diagnosis not present

## 2017-09-22 MED ORDER — DULOXETINE HCL 20 MG PO CPEP: 20.0000 mg | ORAL_CAPSULE | Freq: Three times a day (TID) | ORAL | 5 refills | Status: DC

## 2017-09-22 MED ORDER — ALPRAZOLAM 1 MG PO TABS: 1.0000 mg | ORAL_TABLET | Freq: Three times a day (TID) | ORAL | 5 refills | Status: DC

## 2017-09-22 MED ORDER — PREGABALIN 300 MG PO CAPS: 300.0000 mg | ORAL_CAPSULE | Freq: Two times a day (BID) | ORAL | 5 refills | Status: DC

## 2017-09-22 MED ORDER — TRAZODONE HCL 150 MG PO TABS: 150.0000 mg | ORAL_TABLET | Freq: Every day | ORAL | 5 refills | Status: DC

## 2017-09-22 NOTE — Progress Notes (Signed)
Patient seen for follow-up.  Mood is pretty stable.  No major depression.  No major anxiety attacks.  No suicidal ideation.  Functioning pretty well except for her chronic pain which is still bothering her quite a bit.  Neatly dressed and groomed.  Euthymic affect.  Thoughts lucid no evidence of loosening of associations or delusions.  Speech normal.  Judgment normal.  Patient is requesting that we once again try to increase her dose of Lyrica.  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  I agreed to try increasing the dose again as we had had no luck with it last time on a 3 times a day 150 mg dosage.  Orders will be done renew all of her usual medications as well.  Follow-up 6 months.

## 2017-09-25 ENCOUNTER — Telehealth (HOSPITAL_COMMUNITY): Payer: Self-pay

## 2017-09-25 NOTE — Telephone Encounter (Addendum)
Patient called and said that today she is currently out of Lyrica 300mg , but the prescription that was written on Monday July 29th it's not enough tablets to last one month. Patient also said she prefers the brand name Lyrica. She said when rewriting the prescription please write on there Dispense as Written.   Please review and advise

## 2017-09-26 ENCOUNTER — Other Ambulatory Visit: Payer: Self-pay | Admitting: Psychiatry

## 2017-09-26 MED ORDER — PREGABALIN 300 MG PO CAPS: 300.0000 mg | ORAL_CAPSULE | Freq: Two times a day (BID) | ORAL | 5 refills | Status: DC

## 2017-09-26 NOTE — Telephone Encounter (Signed)
The Xanax prescription was correctly reordered on July 29 at her appointment and [facility] [place].  This is in the chart.  The Lyrica prescription that I sent on July 29 contained an error by repeating the previous directions that implied 3 pills a day.  The correct dosage currently is supposed to be 300 mg pills taken 1 pill twice a day.  I have resent the prescription with that correction.  Both of these prescriptions have now been validated and [facility] [place].

## 2017-09-26 NOTE — Telephone Encounter (Signed)
Called patient and informed her that a new script has been sent to the pharmacy.

## 2017-09-26 NOTE — Telephone Encounter (Signed)
The prescription that I sent on the 29th contained an error because it repeated a previous direction that implied 3 pills a day should be taken.  This is not correct.  As she and I discussed the correct current dosage is to be 300 mg 1 pill twice a day.  The prescription that was sent on the 29th did contain enough pills to be taken at that dosage but I have sent in a new prescription which corrects the previous air or so now there should be no confusion.  She should have plenty of medicine for the prescription as we discussed it which is a total of 600 mg a day.

## 2017-11-12 ENCOUNTER — Telehealth: Payer: Self-pay

## 2017-11-12 NOTE — Telephone Encounter (Signed)
Medication problem - [facility] [place] [facility] after they left a message to verify they do not have Lyrica in and will not have it in for several more days.  Pharmacy reported pt is [facility] as they cannot transfer the order.  Agreed to send message to Dr. Weber Cooks with request.

## 2017-12-02 ENCOUNTER — Telehealth: Payer: Self-pay

## 2017-12-02 NOTE — Telephone Encounter (Signed)
  [facility] message that they needed a clarification or a new rx on lyrica pt will not have enough medication if take 1 in am and 2 in pm.   pregabalin (LYRICA) 300 MG capsule  Medication  Date: 09/26/2017 Department: [place] Ordering/Authorizing: Clapacs, Madie Reno, MD  Order Providers   Prescribing Provider Encounter Provider  Clapacs, Madie Reno, MD Clapacs, Madie Reno, MD  Outpatient Medication Detail    Disp Refills Start End   pregabalin (LYRICA) 300 MG capsule 60 capsule 5 09/26/2017    Sig - Route: Take 1 capsule (300 mg total) by mouth 2 (two) times daily. 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

## 2017-12-15 ENCOUNTER — Ambulatory Visit (INDEPENDENT_AMBULATORY_CARE_PROVIDER_SITE_OTHER): Payer: Medicaid Other | Admitting: Psychiatry

## 2017-12-15 ENCOUNTER — Other Ambulatory Visit: Payer: Self-pay

## 2017-12-15 ENCOUNTER — Encounter: Payer: Self-pay | Admitting: Psychiatry

## 2017-12-15 VITALS — BP 134/81 | HR 82 | Temp 97.8°F | Wt 291.6 lb

## 2017-12-15 DIAGNOSIS — F4310 Post-traumatic stress disorder, unspecified: Secondary | ICD-10-CM

## 2017-12-15 DIAGNOSIS — F3130 Bipolar disorder, current episode depressed, mild or moderate severity, unspecified: Secondary | ICD-10-CM

## 2017-12-15 MED ORDER — TRAZODONE HCL 150 MG PO TABS: 150.0000 mg | ORAL_TABLET | Freq: Every day | ORAL | 5 refills | Status: DC

## 2017-12-15 MED ORDER — ALPRAZOLAM 1 MG PO TABS: 1.0000 mg | ORAL_TABLET | Freq: Three times a day (TID) | ORAL | 5 refills | Status: DC

## 2017-12-15 MED ORDER — PREGABALIN 300 MG PO CAPS: 300.0000 mg | ORAL_CAPSULE | Freq: Two times a day (BID) | ORAL | 5 refills | Status: DC

## 2017-12-15 MED ORDER — DULOXETINE HCL 20 MG PO CPEP
20.0000 mg | ORAL_CAPSULE | Freq: Three times a day (TID) | ORAL | 5 refills | Status: DC
Start: 2017-12-15 — End: 2018-03-23

## 2017-12-16 ENCOUNTER — Other Ambulatory Visit: Payer: Self-pay | Admitting: Psychiatry

## 2017-12-16 MED ORDER — PREGABALIN 300 MG PO CAPS: 300.0000 mg | ORAL_CAPSULE | Freq: Two times a day (BID) | ORAL | 5 refills | Status: DC

## 2017-12-16 NOTE — Telephone Encounter (Signed)
ok 

## 2017-12-16 NOTE — Telephone Encounter (Signed)
I cannot even keep up with all of the problems she has with her Lyrica order.  Patient was taking it incorrectly despite our lengthy conversations about it.  I have just put in a new prescription to the [facility].

## 2018-02-23 ENCOUNTER — Ambulatory Visit (INDEPENDENT_AMBULATORY_CARE_PROVIDER_SITE_OTHER): Payer: Self-pay

## 2018-02-23 ENCOUNTER — Ambulatory Visit
Admission: EM | Admit: 2018-02-23 | Discharge: 2018-02-23 | Disposition: A | Discharge status: Home / Self Care | Payer: Self-pay | Attending: Family Medicine | Admitting: Family Medicine

## 2018-02-23 ENCOUNTER — Other Ambulatory Visit: Payer: Self-pay

## 2018-02-23 DIAGNOSIS — J209 Acute bronchitis, unspecified: Secondary | ICD-10-CM | POA: Insufficient documentation

## 2018-02-23 DIAGNOSIS — R0602 Shortness of breath: Secondary | ICD-10-CM

## 2018-02-23 DIAGNOSIS — R509 Fever, unspecified: Secondary | ICD-10-CM

## 2018-02-23 MED ORDER — DOXYCYCLINE HYCLATE 100 MG PO CAPS: 100.0000 mg | ORAL_CAPSULE | Freq: Two times a day (BID) | ORAL | 0 refills | Status: DC

## 2018-02-23 MED ORDER — BENZONATATE 100 MG PO CAPS: 100.0000 mg | ORAL_CAPSULE | Freq: Three times a day (TID) | ORAL | 0 refills | Status: DC | PRN

## 2018-02-23 NOTE — ED Provider Notes (Signed)
[facility]    CSN: [id number] Arrival date & time: 02/23/18  1718  History   Chief Complaint Chief Complaint  Patient presents with  . Cough   HPI  62 year old female presents with cough.  Patient reports a 2-week history of cough, congestion, headache, and associated chest discomfort.  Reports that she had a fever of 101, 5 days ago.  No other recent fever.  Symptoms are severe.  No known exacerbating factors.  No other reported symptoms.  No other complaints.  PMH, Surgical Hx, Family Hx, Social History reviewed and updated as below. Past Medical History:  Diagnosis Date  . CML (chronic myelocytic leukemia) ([place])   . Depression   . DM type 2 (diabetes mellitus, type 2) ([place])   . Environmental allergies   . History of gastric ulcer   . HLD (hyperlipidemia)   . IDA (iron deficiency anemia)    chronic  . Leukemia, chronic myeloid ([place])   . Osteoarthritis   . Panic disorder   . PSS (progressive systemic sclerosis) ([place])   . Unspecified essential hypertension     Patient Active Problem List   Diagnosis Date Noted  . Risk for falls 06/20/2017  . Acute bacterial sinusitis 04/16/2017  . Major depression, recurrent, chronic ([place]) 08/03/2015  . Bilateral pneumonia 09/07/2014  . OSA (obstructive sleep apnea) 09/07/2014  . Obesity hypoventilation syndrome ([place]) 09/07/2014  . Hyponatremia 09/07/2014  . Acute on chronic renal failure ([place]) 09/07/2014  . Morbid obesity ([place]) 09/07/2014  . Hyperkalemia 09/07/2014  . Spinal stenosis, 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  . Chronic back pain 09/07/2014  . Diaper candidiasis 09/07/2014  . Constipation 09/07/2014  . Acute respiratory failure with hypoxia ([place]) 08/31/2014  . Chronic diastolic CHF (congestive heart failure) ([place]) 05/18/2014  . Acute bronchitis 04/05/2014  . Severe obesity (BMI >= 40) ([place]) 02/04/2014  . Cough 01/31/2014  . Snoring 01/31/2014  . SOB (shortness of breath) 01/31/2014  . Post-nasal drip 01/31/2014  .  Clinical depression 11/19/2013  . Blood in sputum 10/19/2013  . Cephalalgia 09/24/2012  . Diastolic heart failure ([place]) 09/08/2012  . Essential (primary) hypertension 09/08/2012  . Arthritis, degenerative 09/08/2012  . Diverticulitis 07/31/2012  . Fibromyalgia 07/31/2012  . PNA (pneumonia) 07/30/2012  . Chronic generalized pain 06/26/2012  . Diabetes mellitus ([place]) 06/26/2012  . Gastric catarrh 06/26/2012  . BP (high blood pressure) 06/26/2012  . Cellulitis and abscess of trunk 03/26/2012  . Cervical pain 02/17/2012  . LBP (low back pain) 02/17/2012  . Hypercholesterolemia 05/27/2011  . Abnormal EKG 04/02/2011  . Chest tightness 04/02/2011  . Chronic pain 04/02/2011  . HTN (hypertension) 04/02/2011  . Anxiety and depression 04/02/2011  . Back ache 01/15/2011  . Chronic myeloid leukemia ([place]) 01/03/2011    Past Surgical History:  Procedure Laterality Date  . back and neck surgery  1999, 2009   had rods placed  . [facility]) No blackages found.   . CARPAL TUNNEL RELEASE    . PARTIAL HYSTERECTOMY     age 38, no cancer    OB History   No obstetric history on file.      Home Medications    Prior to Admission medications   Medication Sig Start Date End Date Taking? Authorizing Provider  ACCU-CHEK GUIDE test strip TEST ONCE DAILY AND AS DIRECTED. 06/27/17  Yes [provider]  ACCU-CHEK SOFTCLIX LANCETS lancets Use 1 each 3 (three) times daily. Use as instructed. 09/11/11  Yes [provider]  albuterol (PROVENTIL HFA;VENTOLIN HFA) 108 (90 BASE) MCG/ACT inhaler Inhale 2 puffs into the lungs every 6 (six) hours as needed for wheezing or shortness of breath.   Yes [provider]  albuterol (PROVENTIL) (2.5 MG/3ML) 0.083% nebulizer solution INHALE CONTENTS OF 1 VIAL VIA NEBULIZER THREE TIMES A DAY prn 03/08/15  Yes [provider]  ALPRAZolam (XANAX) 1 MG tablet Take 1 tablet (1 mg total) by  mouth 3 (three) times daily. 12/15/17  Yes Clapacs, Madie Reno, MD  Ascorbic Acid (VITAMIN C) 1000 MG tablet Take 3,000 mg by mouth daily.   Yes [provider]  Blood Glucose Monitoring Suppl (GLUCOCOM BLOOD GLUCOSE MONITOR) DEVI Use as directed. 09/11/11  Yes [provider]  bosutinib (BOSULIF) 100 MG tablet Take 4 tablets (400 mg total) by mouth daily. 12/17/16  Yes Lloyd Huger, MD  celecoxib (CELEBREX) 200 MG capsule TAKE 1 CAPSULE BY MOUTH TWO TIMES A DAY 03/25/17  Yes Clapacs, Madie Reno, MD  cetirizine (ZYRTEC) 10 MG tablet Take 20 mg by mouth daily.    Yes [provider]  Cholecalciferol (VITAMIN D PO) Take 1 tablet by mouth daily.   Yes [provider]  co-enzyme Q-10 50 MG capsule Take 50 mg by mouth daily.   Yes [provider]  diphenhydrAMINE (BENADRYL) 25 MG tablet Take 25 mg by mouth every 6 (six) hours as needed for itching or allergies.    Yes [provider]  DULoxetine (CYMBALTA) 20 MG capsule Take 1 capsule (20 mg total) by mouth 3 (three) times daily. 12/15/17  Yes Clapacs, Madie Reno, MD  fluticasone (FLONASE) 50 MCG/ACT nasal spray Place 1-2 sprays into both nostrils as needed for rhinitis.    Yes [provider]  furosemide (LASIX) 40 MG tablet Take 1 tablet (40 mg total) by mouth daily. 01/23/15  Yes Cammie Sickle, MD  insulin aspart (NOVOLOG) 100 UNIT/ML injection Inject 15 Units into the skin 3 (three) times daily as needed for high blood sugar.    Yes [provider]  insulin glargine (LANTUS) 100 UNIT/ML injection Inject 0.37 mLs (37 Units total) into the skin daily. 09/07/14  Yes Theodoro Grist, MD  losartan (COZAAR) 100 MG tablet Take 100 mg by mouth daily.   Yes [provider]  montelukast (SINGULAIR) 10 MG tablet TAKE 1 TABLET BY MOUTH NIGHTLY. 04/26/16  Yes [provider]  Olopatadine HCl (PATADAY) 0.2 % SOLN Place 1 drop into both eyes daily. 02/15/15  Yes [provider]  Omega-3 Fatty Acids (OMEGA 3 PO) Take 3 capsules by mouth daily.   Yes [provider]  omeprazole (PRILOSEC) 40 MG capsule Take by mouth. Reported on 04/21/2015   Yes [provider]  pregabalin (LYRICA) 300 MG capsule Take 1 capsule (300 mg total) by mouth 2 (two) times daily. 12/16/17  Yes Clapacs, Madie Reno, MD  spironolactone (ALDACTONE) 25 MG tablet Take 1 tablet (25 mg total) by mouth 2 (two) times daily. 05/31/16  Yes Lloyd Huger, MD  traZODone (DESYREL) 150 MG tablet Take 1 tablet (150 mg total) by mouth at bedtime. 12/15/17  Yes Clapacs, Madie Reno, MD  vitamin B-12 (CYANOCOBALAMIN) 1000 MCG tablet Take 4,000 mcg by mouth daily.   Yes [provider]  benzonatate (TESSALON) 100 MG capsule Take 1 capsule (100 mg total) by mouth 3 (three) times daily as needed. 02/23/18   Coral Spikes, DO  doxycycline (VIBRAMYCIN) 100 MG capsule Take 1 capsule (100  mg total) by mouth 2 (two) times daily. 02/23/18   Coral Spikes, DO    Family History Family History  Problem Relation Age of Onset  . Diabetes Mother   . Hypertension Mother   . Heart disease Mother   . Prostate cancer Father   . Heart disease Father   . Diabetes Brother     Social History Social History   Tobacco Use  . Smoking status: Never Smoker  . Smokeless tobacco: Never Used  . Tobacco comment: quit 1985  Substance Use Topics  . Alcohol use: No    Alcohol/week: 0.0 standard drinks  . Drug use: No     Allergies   Morphine; Codeine; Latex; Morphine and related; Relafen [nabumetone]; and Tape   Review of Systems Review of Systems  Constitutional: Positive for fever.  HENT: Positive for congestion.   Respiratory: Positive for cough.   Cardiovascular: Positive for chest pain.  Neurological: Positive for headaches.   Physical Exam Triage Vital Signs ED Triage Vitals  Enc Vitals Group     BP 02/23/18 1820 (!) 144/70     Pulse Rate 02/23/18 1820 78     Resp 02/23/18  1820 18     Temp 02/23/18 1820 98.5 F (36.9 C)     Temp Source 02/23/18 1820 Oral     SpO2 02/23/18 1820 94 %     Weight 02/23/18 1817 280 lb (127 kg)     Height 02/23/18 1817 5\' 7"  (1.702 m)     Head Circumference --      Peak Flow --      Pain Score 02/23/18 1815 10     Pain Loc --      Pain Edu? --      Excl. in [place]? --    Updated Vital Signs BP (!) 144/70 (BP Location: Left Arm)   Pulse 78   Temp 98.5 F (36.9 C) (Oral)   Resp 18   Ht 5\' 7"  (1.702 m)   Wt 127 kg   SpO2 94%   BMI 43.85 kg/m   Visual Acuity Right Eye Distance:   Left Eye Distance:   Bilateral Distance:    Right Eye Near:   Left Eye Near:    Bilateral Near:     Physical Exam Vitals signs and nursing note reviewed.  Constitutional:      General: She is not in acute distress. HENT:     Head: Normocephalic and atraumatic.     Right Ear: Tympanic membrane normal.     Left Ear: Tympanic membrane normal.     Mouth/Throat:     Pharynx: Oropharynx is clear. No posterior oropharyngeal erythema.  Cardiovascular:     Rate and Rhythm: Normal rate and regular rhythm.  Pulmonary:     Effort: Pulmonary effort is normal. No respiratory distress.     Comments: Coarse breath sounds. Neurological:     Mental Status: She is alert.  Psychiatric:        Mood and Affect: Mood normal.        Behavior: Behavior normal.    UC Treatments / Results  Labs (all labs ordered are listed, but only abnormal results are displayed) Labs Reviewed - No data to display  EKG None  Radiology Dg Chest 2 View  Result Date: 02/23/2018 CLINICAL DATA:  Shortness of breath and chest tightness with recent fever EXAM: CHEST - 2 VIEW COMPARISON:  October 24, 2016 FINDINGS: No edema or consolidation. The heart size and pulmonary vascularity  are normal. No adenopathy. There is degenerative change in the lower thoracic spine. There is postoperative change in the lower cervical spine. IMPRESSION: No edema or consolidation.  Electronically Signed   By: Lowella Grip III M.D.   On: 02/23/2018 19:25    Procedures Procedures (including critical care time)  Medications Ordered in UC Medications - No data to display  Initial Impression / Assessment and Plan / UC Course  I have reviewed the triage vital signs and the nursing notes.  Pertinent labs & imaging results that were available during my care of the patient were reviewed by me and considered in my medical decision making (see chart for details).    62 year old female presents with bronchitis.  Given duration of illness, comorbidity, and fever I am treating her with doxycycline.  Tessalon Perles for cough.  Final Clinical Impressions(s) / UC Diagnoses   Final diagnoses:  Acute bronchitis, unspecified organism   Discharge Instructions   None    ED Prescriptions    Medication Sig Dispense Auth. Provider   doxycycline (VIBRAMYCIN) 100 MG capsule Take 1 capsule (100 mg total) by mouth 2 (two) times daily. 14 capsule Johnrobert Foti G, DO   benzonatate (TESSALON) 100 MG capsule Take 1 capsule (100 mg total) by mouth 3 (three) times daily as needed. 30 capsule Coral Spikes, DO     Controlled Substance Prescriptions [place] Controlled Substance Registry consulted? Not Applicable   Coral Spikes, DO 02/23/18 2118

## 2018-02-23 NOTE — ED Triage Notes (Signed)
Patient complains of cough, congestion, body aches. Patient states that this started a few weeks ago and has been living with her son recently.

## 2018-03-23 ENCOUNTER — Other Ambulatory Visit: Payer: Self-pay

## 2018-03-23 ENCOUNTER — Encounter: Payer: Self-pay | Admitting: Psychiatry

## 2018-03-23 ENCOUNTER — Ambulatory Visit (INDEPENDENT_AMBULATORY_CARE_PROVIDER_SITE_OTHER): Payer: Medicaid Other | Admitting: Psychiatry

## 2018-03-23 VITALS — BP 114/76 | HR 96 | Temp 97.7°F | Wt 300.0 lb

## 2018-03-23 DIAGNOSIS — F3130 Bipolar disorder, current episode depressed, mild or moderate severity, unspecified: Secondary | ICD-10-CM

## 2018-03-23 DIAGNOSIS — F4310 Post-traumatic stress disorder, unspecified: Secondary | ICD-10-CM

## 2018-03-23 MED ORDER — ALPRAZOLAM 1 MG PO TABS: 1.0000 mg | ORAL_TABLET | Freq: Three times a day (TID) | ORAL | 5 refills | Status: DC

## 2018-03-23 MED ORDER — DULOXETINE HCL 30 MG PO CPEP: 30.0000 mg | ORAL_CAPSULE | Freq: Three times a day (TID) | ORAL | 5 refills | Status: DC

## 2018-03-23 MED ORDER — TRAZODONE HCL 150 MG PO TABS: 150.0000 mg | ORAL_TABLET | Freq: Every day | ORAL | 5 refills | Status: DC

## 2018-03-23 MED ORDER — PREGABALIN 300 MG PO CAPS: 300.0000 mg | ORAL_CAPSULE | Freq: Two times a day (BID) | ORAL | 5 refills | Status: DC

## 2018-03-23 NOTE — Progress Notes (Signed)
Follow-up for this patient with chronic depression and anxiety.  Since we last met formally her husband has gotten sicker.  He is still alive but is in a assisted living facility.  Patient of course is upset about this.  Very tearful.  Mood feeling even more down.  No signs of suicidality no signs of psychosis.  Patient is neatly dressed and groomed.  She is staying with her son and is taking care of her ADLs.  Physical symptoms are about the same as usual.  Neatly dressed.  Good eye contact normal psychomotor activity speech normal rate tone and volume.  Affect demonstrative but reactive.  Thoughts lucid.  No loosening of associations.  Alert and oriented x4.  Patient says she wants to "feel better".  We reviewed her medicine.  I suggested we could try increasing her Cymbalta to a total of 90 mg a day as that seems to a bit of some help so far.  No need to increase other medicines.  She agrees to the plan.  Prescriptions will be redone.  We can check up again in another 6 months or she can call sooner if needed.

## 2018-04-05 ENCOUNTER — Ambulatory Visit
Admission: EM | Admit: 2018-04-05 | Discharge: 2018-04-05 | Disposition: A | Discharge status: Home / Self Care | Payer: Self-pay | Attending: Family Medicine | Admitting: Family Medicine

## 2018-04-05 ENCOUNTER — Other Ambulatory Visit: Payer: Self-pay

## 2018-04-05 DIAGNOSIS — J111 Influenza due to unidentified influenza virus with other respiratory manifestations: Secondary | ICD-10-CM

## 2018-04-05 DIAGNOSIS — D899 Disorder involving the immune mechanism, unspecified: Secondary | ICD-10-CM

## 2018-04-05 DIAGNOSIS — J101 Influenza due to other identified influenza virus with other respiratory manifestations: Secondary | ICD-10-CM

## 2018-04-05 DIAGNOSIS — D849 Immunodeficiency, unspecified: Secondary | ICD-10-CM

## 2018-04-05 HISTORY — DX: Leukemia, unspecified not having achieved remission: C95.90

## 2018-04-05 LAB — RAPID INFLUENZA A&B ANTIGENS (ARMC ONLY): INFLUENZA A (ARMC): POSITIVE — AB

## 2018-04-05 LAB — RAPID INFLUENZA A&B ANTIGENS: Influenza B (ARMC): NEGATIVE

## 2018-04-05 MED ORDER — PREDNISONE 20 MG PO TABS: 20.0000 mg | ORAL_TABLET | Freq: Every day | ORAL | 0 refills | Status: DC

## 2018-04-05 MED ORDER — ONDANSETRON 8 MG PO TBDP: 8.0000 mg | ORAL_TABLET | Freq: Three times a day (TID) | ORAL | 0 refills | Status: DC | PRN

## 2018-04-05 MED ORDER — OSELTAMIVIR PHOSPHATE 75 MG PO CAPS: 75.0000 mg | ORAL_CAPSULE | Freq: Two times a day (BID) | ORAL | 0 refills | Status: DC

## 2018-04-05 MED ORDER — DOXYCYCLINE HYCLATE 100 MG PO TABS: 100.0000 mg | ORAL_TABLET | Freq: Two times a day (BID) | ORAL | 0 refills | Status: DC

## 2018-04-05 MED ORDER — FLUCONAZOLE 150 MG PO TABS: 150.0000 mg | ORAL_TABLET | Freq: Every day | ORAL | 0 refills | Status: DC

## 2018-04-05 NOTE — Discharge Instructions (Signed)
Rest, fluids. 

## 2018-04-05 NOTE — ED Provider Notes (Signed)
[facility]    CSN: [id number] Arrival date & time: 04/05/18  0931     History   Chief Complaint Chief Complaint  Patient presents with  . Headache  . Fever    HPI Tara Levine is a 64 y.o. female.   The history is provided by the patient.  Headache  Associated symptoms: congestion, fatigue, fever, myalgias and URI   Fever  Associated symptoms: congestion, headaches, myalgias and rhinorrhea   URI  Presenting symptoms: congestion, fatigue, fever and rhinorrhea   Severity:  Moderate Onset quality:  Sudden Duration:  2 days Timing:  Constant Progression:  Worsening Chronicity:  New Relieved by:  None tried Ineffective treatments:  None tried Associated symptoms: headaches and myalgias   Risk factors: diabetes mellitus, immunosuppression and sick contacts     Past Medical History:  Diagnosis Date  . CML (chronic myelocytic leukemia) ([place])   . Depression   . DM type 2 (diabetes mellitus, type 2) ([place])   . Environmental allergies   . History of gastric ulcer   . HLD (hyperlipidemia)   . IDA (iron deficiency anemia)    chronic  . Leukemia ([place])   . Leukemia, chronic myeloid ([place])   . Osteoarthritis   . Panic disorder   . PSS (progressive systemic sclerosis) ([place])   . Unspecified essential hypertension     Patient Active Problem List   Diagnosis Date Noted  . Risk for falls 06/20/2017  . Acute bacterial sinusitis 04/16/2017  . Major depression, recurrent, chronic ([place]) 08/03/2015  . Bilateral pneumonia 09/07/2014  . OSA (obstructive sleep apnea) 09/07/2014  . Obesity hypoventilation syndrome ([place]) 09/07/2014  . Hyponatremia 09/07/2014  . Acute on chronic renal failure ([place]) 09/07/2014  . Morbid obesity ([place]) 09/07/2014  . Hyperkalemia 09/07/2014  . Spinal stenosis, 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  . Chronic back pain 09/07/2014  . Diaper candidiasis 09/07/2014  . Constipation 09/07/2014  . Acute respiratory failure with hypoxia ([place]) 08/31/2014  .  Chronic diastolic CHF (congestive heart failure) ([place]) 05/18/2014  . Acute bronchitis 04/05/2014  . Severe obesity (BMI >= 40) ([place]) 02/04/2014  . Cough 01/31/2014  . Snoring 01/31/2014  . SOB (shortness of breath) 01/31/2014  . Post-nasal drip 01/31/2014  . Clinical depression 11/19/2013  . Blood in sputum 10/19/2013  . Cephalalgia 09/24/2012  . Diastolic heart failure ([place]) 09/08/2012  . Essential (primary) hypertension 09/08/2012  . Arthritis, degenerative 09/08/2012  . Diverticulitis 07/31/2012  . Fibromyalgia 07/31/2012  . PNA (pneumonia) 07/30/2012  . Chronic generalized pain 06/26/2012  . Diabetes mellitus ([place]) 06/26/2012  . Gastric catarrh 06/26/2012  . BP (high blood pressure) 06/26/2012  . Cellulitis and abscess of trunk 03/26/2012  . Cervical pain 02/17/2012  . LBP (low back pain) 02/17/2012  . Hypercholesterolemia 05/27/2011  . Abnormal EKG 04/02/2011  . Chest tightness 04/02/2011  . Chronic pain 04/02/2011  . HTN (hypertension) 04/02/2011  . Anxiety and depression 04/02/2011  . Back ache 01/15/2011  . Chronic myeloid leukemia ([place]) 01/03/2011    Past Surgical History:  Procedure Laterality Date  . back and neck surgery  1999, 2009   had rods placed  . [facility]) No blackages found.   . CARPAL TUNNEL RELEASE    . PARTIAL HYSTERECTOMY     age 60, no cancer    OB History   No obstetric history on file.      Home Medications    Prior to Admission medications   Medication  Sig Start Date End Date Taking? Authorizing Provider  ACCU-CHEK GUIDE test strip TEST ONCE DAILY AND AS DIRECTED. 06/27/17   [provider]  ACCU-CHEK SOFTCLIX LANCETS lancets Use 1 each 3 (three) times daily. Use as instructed. 09/11/11   [provider]  albuterol (PROVENTIL HFA;VENTOLIN HFA) 108 (90 BASE) MCG/ACT inhaler Inhale 2 puffs into the lungs every 6 (six) hours as needed for wheezing or shortness of  breath.    [provider]  albuterol (PROVENTIL) (2.5 MG/3ML) 0.083% nebulizer solution INHALE CONTENTS OF 1 VIAL VIA NEBULIZER THREE TIMES A DAY prn 03/08/15   [provider]  ALPRAZolam Duanne Moron) 1 MG tablet Take 1 tablet (1 mg total) by mouth 3 (three) times daily. 03/23/18   Clapacs, Madie Reno, MD  Ascorbic Acid (VITAMIN C) 1000 MG tablet Take 3,000 mg by mouth daily.    [provider]  Blood Glucose Monitoring Suppl (GLUCOCOM BLOOD GLUCOSE MONITOR) DEVI Use as directed. 09/11/11   [provider]  bosutinib (BOSULIF) 100 MG tablet Take 4 tablets (400 mg total) by mouth daily. 12/17/16   Lloyd Huger, MD  celecoxib (CELEBREX) 200 MG capsule TAKE 1 CAPSULE BY MOUTH TWO TIMES A DAY 03/25/17   Clapacs, Madie Reno, MD  cetirizine (ZYRTEC) 10 MG tablet Take 20 mg by mouth daily.     [provider]  Cholecalciferol (VITAMIN D PO) Take 1 tablet by mouth daily.    [provider]  co-enzyme Q-10 50 MG capsule Take 50 mg by mouth daily.    [provider]  diphenhydrAMINE (BENADRYL) 25 MG tablet Take 25 mg by mouth every 6 (six) hours as needed for itching or allergies.     [provider]  doxycycline (VIBRA-TABS) 100 MG tablet Take 1 tablet (100 mg total) by mouth 2 (two) times daily. 04/05/18   Norval Gable, MD  DULoxetine (CYMBALTA) 30 MG capsule Take 1 capsule (30 mg total) by mouth 3 (three) times daily. 03/23/18   Clapacs, Madie Reno, MD  fluconazole (DIFLUCAN) 150 MG tablet Take 1 tablet (150 mg total) by mouth daily. 04/05/18   Norval Gable, MD  fluticasone (FLONASE) 50 MCG/ACT nasal spray Place 1-2 sprays into both nostrils as needed for rhinitis.     [provider]  furosemide (LASIX) 40 MG tablet Take 1 tablet (40 mg total) by mouth daily. 01/23/15   Cammie Sickle, MD  insulin aspart (NOVOLOG) 100 UNIT/ML injection Inject 15 Units into the skin 3 (three) times daily as needed for high blood sugar.     [provider]  insulin glargine (LANTUS) 100 UNIT/ML injection Inject 0.37 mLs (37 Units total) into the skin daily. 09/07/14   Theodoro Grist, MD  losartan (COZAAR) 100 MG tablet Take 100 mg by mouth daily.    [provider]  montelukast (SINGULAIR) 10 MG tablet TAKE 1 TABLET BY MOUTH NIGHTLY. 04/26/16   [provider]  Olopatadine HCl (PATADAY) 0.2 % SOLN Place 1 drop into both eyes daily. 02/15/15   [provider]  Omega-3 Fatty Acids (OMEGA 3 PO) Take 3 capsules by mouth daily.    [provider]  omeprazole (PRILOSEC) 40 MG capsule Take by mouth. Reported on 04/21/2015    [provider]  ondansetron (ZOFRAN ODT) 8 MG disintegrating tablet Take 1 tablet (8 mg total) by mouth every 8 (eight) hours as needed. 04/05/18   Norval Gable, MD  oseltamivir (TAMIFLU) 75 MG capsule Take 1 capsule (75 mg total)  by mouth 2 (two) times daily. 04/05/18   Norval Gable, MD  predniSONE (DELTASONE) 20 MG tablet Take 1 tablet (20 mg total) by mouth daily. 04/05/18   Norval Gable, MD  pregabalin (LYRICA) 300 MG capsule Take 1 capsule (300 mg total) by mouth 2 (two) times daily. 03/23/18   Clapacs, Madie Reno, MD  spironolactone (ALDACTONE) 25 MG tablet Take 1 tablet (25 mg total) by mouth 2 (two) times daily. 05/31/16   Lloyd Huger, MD  traZODone (DESYREL) 150 MG tablet Take 1 tablet (150 mg total) by mouth at bedtime. 03/23/18   Clapacs, Madie Reno, MD  vitamin B-12 (CYANOCOBALAMIN) 1000 MCG tablet Take 4,000 mcg by mouth daily.    [provider]    Family History Family History  Problem Relation Age of Onset  . Diabetes Mother   . Hypertension Mother   . Heart disease Mother   . Prostate cancer Father   . Heart disease Father   . Diabetes Brother     Social History Social History   Tobacco Use  . Smoking status: Never Smoker  . Smokeless tobacco: Never Used  . Tobacco comment: quit 1985  Substance Use Topics  . Alcohol use: No    Alcohol/week:  0.0 standard drinks  . Drug use: No     Allergies   Morphine; Codeine; Latex; Morphine and related; Relafen [nabumetone]; Nsaids; and Tape   Review of Systems Review of Systems  Constitutional: Positive for fatigue and fever.  HENT: Positive for congestion and rhinorrhea.   Musculoskeletal: Positive for myalgias.  Neurological: Positive for headaches.     Physical Exam Triage Vital Signs ED Triage Vitals  Enc Vitals Group     BP 04/05/18 0942 101/60     Pulse Rate 04/05/18 0942 90     Resp 04/05/18 0942 20     Temp 04/05/18 0942 99.1 F (37.3 C)     Temp Source 04/05/18 0942 Oral     SpO2 04/05/18 0942 95 %     Weight 04/05/18 0943 279 lb 15.8 oz (127 kg)     Height 04/05/18 0943 5\' 7"  (1.702 m)     Head Circumference --      Peak Flow --      Pain Score 04/05/18 0943 9     Pain Loc --      Pain Edu? --      Excl. in [place]? --    No data found.  Updated Vital Signs BP 101/60 (BP Location: Right Arm)   Pulse 90   Temp 99.1 F (37.3 C) (Oral)   Resp 20   Ht 5\' 7"  (1.702 m)   Wt 127 kg   SpO2 95%   BMI 43.85 kg/m   Visual Acuity Right Eye Distance:   Left Eye Distance:   Bilateral Distance:    Right Eye Near:   Left Eye Near:    Bilateral Near:     Physical Exam Vitals signs and nursing note reviewed.  Constitutional:      General: She is not in acute distress.    Appearance: She is well-developed. She is not toxic-appearing or diaphoretic.  HENT:     Head: Normocephalic and atraumatic.     Nose: Rhinorrhea present.     Mouth/Throat:     Pharynx: Uvula midline. No oropharyngeal exudate.  Eyes:     General: No scleral icterus.       Right eye: No discharge.        Left  eye: No discharge.  Neck:     Musculoskeletal: Normal range of motion and neck supple.     Thyroid: No thyromegaly.  Cardiovascular:     Rate and Rhythm: Normal rate and regular rhythm.     Heart sounds: Normal heart sounds.  Pulmonary:     Effort: Pulmonary effort is normal.  No respiratory distress.     Breath sounds: Normal breath sounds. No stridor. No wheezing, rhonchi or rales.  Lymphadenopathy:     Cervical: No cervical adenopathy.  Neurological:     Mental Status: She is alert.      UC Treatments / Results  Labs (all labs ordered are listed, but only abnormal results are displayed) Labs Reviewed  RAPID INFLUENZA A&B ANTIGENS ([place]) - Abnormal; Notable for the following components:      Result Value   Influenza A ([facility]) POSITIVE (*)    All other components within normal limits    EKG None  Radiology No results found.  Procedures Procedures (including critical care time)  Medications Ordered in UC Medications - No data to display  Initial Impression / Assessment and Plan / UC Course  I have reviewed the triage vital signs and the nursing notes.  Pertinent labs & imaging results that were available during my care of the patient were reviewed by me and considered in my medical decision making (see chart for details).      Final Clinical Impressions(s) / UC Diagnoses   Final diagnoses:  Influenza  Immunosuppression [facility])    ED Prescriptions    Medication Sig Dispense Auth. Provider   oseltamivir (TAMIFLU) 75 MG capsule Take 1 capsule (75 mg total) by mouth 2 (two) times daily. 10 capsule Norval Gable, MD   doxycycline (VIBRA-TABS) 100 MG tablet Take 1 tablet (100 mg total) by mouth 2 (two) times daily. 20 tablet Norval Gable, MD   predniSONE (DELTASONE) 20 MG tablet Take 1 tablet (20 mg total) by mouth daily. 5 tablet Norval Gable, MD   fluconazole (DIFLUCAN) 150 MG tablet Take 1 tablet (150 mg total) by mouth daily. 1 tablet Norval Gable, MD   ondansetron (ZOFRAN ODT) 8 MG disintegrating tablet Take 1 tablet (8 mg total) by mouth every 8 (eight) hours as needed. 6 tablet Norval Gable, MD      1. Lab results and diagnosis reviewed with patient 2. rx as per orders above; reviewed possible side effects,  interactions, risks and benefits  3. Recommend supportive treatment with rest,fluids, otc analgesics prn 4. Follow-up prn if symptoms worsen or don't improve   Controlled Substance Prescriptions [place] Controlled Substance Registry consulted? Not Applicable   Norval Gable, MD 04/05/18 [phone]

## 2018-04-05 NOTE — ED Triage Notes (Signed)
Pt with fever up to 102, headache, vomit x one episode. Pain 9/10

## 2018-07-17 ENCOUNTER — Other Ambulatory Visit: Payer: Self-pay | Admitting: Psychiatry

## 2018-10-13 ENCOUNTER — Ambulatory Visit (INDEPENDENT_AMBULATORY_CARE_PROVIDER_SITE_OTHER): Payer: Self-pay | Admitting: Psychiatry

## 2018-10-13 ENCOUNTER — Encounter: Payer: Self-pay | Admitting: Psychiatry

## 2018-10-13 ENCOUNTER — Other Ambulatory Visit: Payer: Self-pay

## 2018-10-13 ENCOUNTER — Other Ambulatory Visit: Payer: Self-pay | Admitting: Psychiatry

## 2018-10-13 DIAGNOSIS — F4310 Post-traumatic stress disorder, unspecified: Secondary | ICD-10-CM

## 2018-10-13 DIAGNOSIS — F3130 Bipolar disorder, current episode depressed, mild or moderate severity, unspecified: Secondary | ICD-10-CM

## 2018-10-13 MED ORDER — PREGABALIN 300 MG PO CAPS: 300.0000 mg | ORAL_CAPSULE | Freq: Two times a day (BID) | ORAL | 5 refills | Status: DC

## 2018-10-13 MED ORDER — MONTELUKAST SODIUM 10 MG PO TABS: 10.0000 mg | ORAL_TABLET | Freq: Every day | ORAL | 5 refills | Status: DC

## 2018-10-13 MED ORDER — TRAZODONE HCL 150 MG PO TABS: 150.0000 mg | ORAL_TABLET | Freq: Every day | ORAL | 5 refills | Status: DC

## 2018-10-13 MED ORDER — ALPRAZOLAM 1 MG PO TABS: 1.0000 mg | ORAL_TABLET | Freq: Three times a day (TID) | ORAL | 5 refills | Status: DC

## 2018-10-13 MED ORDER — DULOXETINE HCL 30 MG PO CPEP: 30.0000 mg | ORAL_CAPSULE | Freq: Three times a day (TID) | ORAL | 5 refills | Status: DC

## 2018-10-13 NOTE — Progress Notes (Signed)
Follow-up for this patient with chronic depression and multiple medical problems.  Her husband is now on hospice care at home.  Patient is of course at home all the time because of the virus anyway.  Feels sad and down and depressed dealing with her husband's dwindling health.  Frequent crying.  On the other hand no suicidal ideation patient still eating well and able to get herself out of her bad depression.  Medical problems still persist.  Patient was appropriate and lucid on the phone.  Alert and oriented.  No sign of any psychotic thinking.  Denied suicidal thoughts.  Cognitively appears intact.  Reviewed all medications.  She is asking that her Lyrica be sent to 1 pharmacy and all the rest of the medicines to a different ones so I will set that out.  I have reordered her Singulair as she tells me that her primary care doctor is refusing to do that.  Patient says without it she is wheezing and has trouble breathing.  Lots of supportive therapy and counseling.  Patient can follow-up in 6 months sooner if needed.

## 2018-11-24 ENCOUNTER — Encounter: Payer: Self-pay | Admitting: Psychiatry

## 2018-11-24 ENCOUNTER — Ambulatory Visit (INDEPENDENT_AMBULATORY_CARE_PROVIDER_SITE_OTHER): Payer: Self-pay | Admitting: Psychiatry

## 2018-11-24 ENCOUNTER — Other Ambulatory Visit: Payer: Self-pay

## 2018-11-24 DIAGNOSIS — F3130 Bipolar disorder, current episode depressed, mild or moderate severity, unspecified: Secondary | ICD-10-CM

## 2018-11-24 DIAGNOSIS — F4310 Post-traumatic stress disorder, unspecified: Secondary | ICD-10-CM

## 2018-11-24 MED ORDER — DULOXETINE HCL 20 MG PO CPEP: 20.0000 mg | ORAL_CAPSULE | Freq: Three times a day (TID) | ORAL | 5 refills | Status: DC

## 2018-11-24 MED ORDER — TRAZODONE HCL 150 MG PO TABS: 150.0000 mg | ORAL_TABLET | Freq: Every day | ORAL | 5 refills | Status: DC

## 2018-11-24 MED ORDER — PREGABALIN 300 MG PO CAPS: 300.0000 mg | ORAL_CAPSULE | Freq: Two times a day (BID) | ORAL | 5 refills | Status: DC

## 2018-11-24 MED ORDER — ALPRAZOLAM 1 MG PO TABS: 1.0000 mg | ORAL_TABLET | Freq: Three times a day (TID) | ORAL | 5 refills | Status: DC

## 2018-11-24 NOTE — Progress Notes (Signed)
Follow-up for this patient with chronic anxiety and depression possible bipolar disorder.  Patient was reached by telephone.  Patient was on time and appropriate in the interview.  She seems to be more stable than she was the last time we spoke.  Her husband is still alive and still very impaired but she is managing to take care of him along with the assistance she gets from home health nursing without it being too overwhelming.  She again manages to get some sleep at night.  Pain is controlled adequately.  Patient did say that she found that duloxetine no longer seem to work when her local pharmacy changed providers to a Acupuncturist.  She shopped around on her own and found that [facility] has the old manufacturer and wants me to put in the prescription there.  Other prescriptions can continue to go to the pharmacy she has always used.  Patient was alert and appropriate in her interaction.  Affect euthymic.  Lucid and clear.  No evident psychotic thinking.  Denied suicidal thoughts.  Seems to have good judgment about her situation.  Renewed medication including her alprazolam, duloxetine and trazodone.  Follow-up 6 months or she is aware she can always call sooner if needed.

## 2018-11-29 ENCOUNTER — Other Ambulatory Visit: Payer: Self-pay

## 2018-11-29 ENCOUNTER — Emergency Department
Admission: EM | Admit: 2018-11-29 | Discharge: 2018-11-30 | Disposition: A | Discharge status: Home / Self Care | Payer: Self-pay | Attending: Student | Admitting: Student

## 2018-11-29 ENCOUNTER — Emergency Department: Payer: Self-pay

## 2018-11-29 DIAGNOSIS — E119 Type 2 diabetes mellitus without complications: Secondary | ICD-10-CM | POA: Insufficient documentation

## 2018-11-29 DIAGNOSIS — L03115 Cellulitis of right lower limb: Secondary | ICD-10-CM | POA: Insufficient documentation

## 2018-11-29 DIAGNOSIS — Z794 Long term (current) use of insulin: Secondary | ICD-10-CM | POA: Insufficient documentation

## 2018-11-29 DIAGNOSIS — I5032 Chronic diastolic (congestive) heart failure: Secondary | ICD-10-CM | POA: Insufficient documentation

## 2018-11-29 DIAGNOSIS — Z91040 Latex allergy status: Secondary | ICD-10-CM | POA: Insufficient documentation

## 2018-11-29 DIAGNOSIS — L97411 Non-pressure chronic ulcer of right heel and midfoot limited to breakdown of skin: Secondary | ICD-10-CM | POA: Insufficient documentation

## 2018-11-29 DIAGNOSIS — I110 Hypertensive heart disease with heart failure: Secondary | ICD-10-CM | POA: Insufficient documentation

## 2018-11-29 DIAGNOSIS — C9110 Chronic lymphocytic leukemia of B-cell type not having achieved remission: Secondary | ICD-10-CM | POA: Insufficient documentation

## 2018-11-29 DIAGNOSIS — E11621 Type 2 diabetes mellitus with foot ulcer: Secondary | ICD-10-CM | POA: Insufficient documentation

## 2018-11-29 DIAGNOSIS — Z79899 Other long term (current) drug therapy: Secondary | ICD-10-CM | POA: Insufficient documentation

## 2018-11-29 LAB — GLUCOSE, CAPILLARY: Glucose-Capillary: 242 mg/dL — ABNORMAL HIGH (ref 70–99)

## 2018-11-29 LAB — COMPREHENSIVE METABOLIC PANEL
ALT: 16 U/L (ref 0–44)
AST: 45 U/L — ABNORMAL HIGH (ref 15–41)
Albumin: 3.6 g/dL (ref 3.5–5.0)
Alkaline Phosphatase: 68 U/L (ref 38–126)
Anion gap: 11 (ref 5–15)
BUN: 20 mg/dL (ref 8–23)
CO2: 24 mmol/L (ref 22–32)
Calcium: 9.1 mg/dL (ref 8.9–10.3)
Chloride: 97 mmol/L — ABNORMAL LOW (ref 98–111)
Creatinine, Ser: 1.11 mg/dL — ABNORMAL HIGH (ref 0.44–1.00)
GFR calc Af Amer: >60 mL/min (ref >60)
GFR calc non Af Amer: 53 mL/min — ABNORMAL LOW (ref >60)
Glucose, Bld: 219 mg/dL — ABNORMAL HIGH (ref 70–99)
Potassium: 4.5 mmol/L (ref 3.5–5.1)
Sodium: 132 mmol/L — ABNORMAL LOW (ref 135–145)
Total Bilirubin: 1.3 mg/dL — ABNORMAL HIGH (ref 0.3–1.2)
Total Protein: 7.5 g/dL (ref 6.5–8.1)

## 2018-11-29 LAB — CBC WITH DIFFERENTIAL/PLATELET
Abs Immature Granulocytes: 0.03 10*3/uL (ref 0.00–0.07)
Basophils Absolute: 0 10*3/uL (ref 0.0–0.1)
Basophils Relative: 0 %
Eosinophils Absolute: 0.2 10*3/uL (ref 0.0–0.5)
Eosinophils Relative: 3 %
HCT: 32.4 % — ABNORMAL LOW (ref 36.0–46.0)
Hemoglobin: 10.8 g/dL — ABNORMAL LOW (ref 12.0–15.0)
Immature Granulocytes: 0 %
Lymphocytes Relative: 11 %
Lymphs Abs: 0.9 10*3/uL (ref 0.7–4.0)
MCH: 28.4 pg (ref 26.0–34.0)
MCHC: 33.3 g/dL (ref 30.0–36.0)
MCV: 85.3 fL (ref 80.0–100.0)
Monocytes Absolute: 0.8 10*3/uL (ref 0.1–1.0)
Monocytes Relative: 10 %
Neutro Abs: 5.7 10*3/uL (ref 1.7–7.7)
Neutrophils Relative %: 76 %
Platelets: 236 10*3/uL (ref 150–400)
RBC: 3.8 MIL/uL — ABNORMAL LOW (ref 3.87–5.11)
RDW: 14.6 % (ref 11.5–15.5)
WBC: 7.6 10*3/uL (ref 4.0–10.5)
nRBC: 0 % (ref 0.0–0.2)

## 2018-11-29 LAB — LACTIC ACID, PLASMA: Lactic Acid, Venous: 0.9 mmol/L (ref 0.5–1.9)

## 2018-11-29 MED ORDER — PIPERACILLIN-TAZOBACTAM 3.375 G IVPB 30 MIN
3.3750 g | Freq: Once | INTRAVENOUS | Status: AC
  Administered 2018-11-29: 3.375 g via INTRAVENOUS
  Filled 2018-11-29: qty 50

## 2018-11-29 MED ORDER — SODIUM CHLORIDE 0.9 % IV BOLUS
1000.0000 mL | Freq: Once | INTRAVENOUS | Status: AC
  Administered 2018-11-29: 1000 mL via INTRAVENOUS

## 2018-11-29 MED ORDER — CLINDAMYCIN HCL 300 MG PO CAPS: 300.0000 mg | ORAL_CAPSULE | Freq: Four times a day (QID) | ORAL | 0 refills | Status: DC

## 2018-11-29 MED ORDER — CEPHALEXIN 500 MG PO CAPS: 500.0000 mg | ORAL_CAPSULE | Freq: Four times a day (QID) | ORAL | 0 refills | Status: DC

## 2018-11-29 MED ORDER — HYDROCODONE-ACETAMINOPHEN 5-325 MG PO TABS: 1.0000 | ORAL_TABLET | ORAL | 0 refills | Status: DC | PRN

## 2018-11-29 NOTE — ED Provider Notes (Signed)
[facility] Emergency Department Provider Note  ____________________________________________  Time seen: Approximately 6:04 PM  I have reviewed the triage vital signs and the nursing notes.   HISTORY  Chief Complaint Wound Infection    HPI Tara Levine is a 63 y.o. female who presents the emergency department concern for a foot infection to the right foot.  Patient has CML, diabetes, hyperlipidemia, anemia, CHF, obesity, hyperkalemia, chronic renal failure.  Patient reports that she noticed a "blister" to the bottom of her foot from increased walking recently.  Patient reports that this ruptured and she has developed an ulcer as well as erythema and edema to the dorsal aspect of the foot.  Blister/ulceration occurred to the plantar aspect, erythema and edema occurred to dorsal aspect.  No fevers or chills, altered mental status, increased thirst or urination, abdominal pain, nausea vomiting, diarrhea or constipation.  No recent antibiotic use.  No history of diabetic foot ulcers.  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  Patient denies any headache, neck pain, chest pain, shortness of breath, domino pain or other complaint.         Past Medical History:  Diagnosis Date  . CML (chronic myelocytic leukemia) ([place])   . Depression   . DM type 2 (diabetes mellitus, type 2) ([place])   . Environmental allergies   . History of gastric ulcer   . HLD (hyperlipidemia)   . IDA (iron deficiency anemia)    chronic  . Leukemia ([place])   . Leukemia, chronic myeloid ([place])   . Osteoarthritis   . Panic disorder   . PSS (progressive systemic sclerosis) ([place])   . Unspecified essential hypertension     Patient Active Problem List   Diagnosis Date Noted  . Risk for falls 06/20/2017  . Acute bacterial sinusitis 04/16/2017  . Major depression, recurrent, chronic ([place]) 08/03/2015  . Bilateral pneumonia 09/07/2014   . OSA (obstructive sleep apnea) 09/07/2014  . Obesity hypoventilation syndrome ([place]) 09/07/2014  . Hyponatremia 09/07/2014  . Acute on chronic renal failure ([place]) 09/07/2014  . Morbid obesity ([place]) 09/07/2014  . Hyperkalemia 09/07/2014  . Spinal stenosis, 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  . Chronic back pain 09/07/2014  . Diaper candidiasis 09/07/2014  . Constipation 09/07/2014  . Acute respiratory failure with hypoxia ([place]) 08/31/2014  . Chronic diastolic CHF (congestive heart failure) ([place]) 05/18/2014  . Acute bronchitis 04/05/2014  . Severe obesity (BMI >= 40) ([place]) 02/04/2014  . Cough 01/31/2014  . Snoring 01/31/2014  . SOB (shortness of breath) 01/31/2014  . Post-nasal drip 01/31/2014  . Clinical depression 11/19/2013  . Blood in sputum 10/19/2013  . Cephalalgia 09/24/2012  . Diastolic heart failure ([place]) 09/08/2012  . Essential (primary) hypertension 09/08/2012  . Arthritis, degenerative 09/08/2012  . Diverticulitis 07/31/2012  . Fibromyalgia 07/31/2012  . PNA (pneumonia) 07/30/2012  . Chronic generalized pain 06/26/2012  . Diabetes mellitus ([place]) 06/26/2012  . Gastric catarrh 06/26/2012  . BP (high blood pressure) 06/26/2012  . Cellulitis and abscess of trunk 03/26/2012  . Cervical pain 02/17/2012  . LBP (low back pain) 02/17/2012  . Hypercholesterolemia 05/27/2011  . Abnormal EKG 04/02/2011  . Chest tightness 04/02/2011  . Chronic pain 04/02/2011  . HTN (hypertension) 04/02/2011  . Anxiety and depression 04/02/2011  . Back ache 01/15/2011  . Chronic myeloid leukemia ([place]) 01/03/2011    Past Surgical History:  Procedure Laterality Date  . back and neck surgery  1999, 2009   had rods  placed  . [name]) No blackages found.   . CARPAL TUNNEL RELEASE    . PARTIAL HYSTERECTOMY     age 76, no cancer    Prior to Admission medications   Medication Sig Start Date End Date Taking? Authorizing Provider  ACCU-CHEK GUIDE  test strip TEST ONCE DAILY AND AS DIRECTED. 06/27/17   [provider]  ACCU-CHEK SOFTCLIX LANCETS lancets Use 1 each 3 (three) times daily. Use as instructed. 09/11/11   [provider]  albuterol (PROVENTIL HFA;VENTOLIN HFA) 108 (90 BASE) MCG/ACT inhaler Inhale 2 puffs into the lungs every 6 (six) hours as needed for wheezing or shortness of breath.    [provider]  albuterol (PROVENTIL) (2.5 MG/3ML) 0.083% nebulizer solution INHALE CONTENTS OF 1 VIAL VIA NEBULIZER THREE TIMES A DAY prn 03/08/15   [provider]  ALPRAZolam Duanne Moron) 1 MG tablet Take 1 tablet (1 mg total) by mouth 3 (three) times daily. 11/24/18   Clapacs, Madie Reno, MD  Ascorbic Acid (VITAMIN C) 1000 MG tablet Take 3,000 mg by mouth daily.    [provider]  Blood Glucose Monitoring Suppl (GLUCOCOM BLOOD GLUCOSE MONITOR) DEVI Use as directed. 09/11/11   [provider]  bosutinib (BOSULIF) 100 MG tablet Take 4 tablets (400 mg total) by mouth daily. 12/17/16   Lloyd Huger, MD  celecoxib (CELEBREX) 200 MG capsule TAKE 1 CAPSULE BY MOUTH TWO TIMES A DAY 03/25/17   Clapacs, Madie Reno, MD  cephALEXin (KEFLEX) 500 MG capsule Take 1 capsule (500 mg total) by mouth 4 (four) times daily. 11/29/18   Johnsie Moscoso, Charline Bills, PA-C  cetirizine (ZYRTEC) 10 MG tablet Take 20 mg by mouth daily.     [provider]  Cholecalciferol (VITAMIN D PO) Take 1 tablet by mouth daily.    [provider]  clindamycin (CLEOCIN) 300 MG capsule Take 1 capsule (300 mg total) by mouth 4 (four) times daily. 11/29/18   Allena Pietila, Charline Bills, PA-C  co-enzyme Q-10 50 MG capsule Take 50 mg by mouth daily.    [provider]  diphenhydrAMINE (BENADRYL) 25 MG tablet Take 25 mg by mouth every 6 (six) hours as needed for itching or allergies.     [provider]  doxycycline (VIBRA-TABS) 100 MG tablet Take 1 tablet (100 mg total) by mouth 2 (two) times daily. 04/05/18   Norval Gable, MD   DULoxetine (CYMBALTA) 20 MG capsule Take 1 capsule (20 mg total) by mouth 3 (three) times daily. 11/24/18   Clapacs, Madie Reno, MD  fluconazole (DIFLUCAN) 150 MG tablet Take 1 tablet (150 mg total) by mouth daily. 04/05/18   Norval Gable, MD  fluticasone (FLONASE) 50 MCG/ACT nasal spray Place 1-2 sprays into both nostrils as needed for rhinitis.     [provider]  furosemide (LASIX) 40 MG tablet Take 1 tablet (40 mg total) by mouth daily. 01/23/15   Cammie Sickle, MD  HYDROcodone-acetaminophen (NORCO/VICODIN) 5-325 MG tablet Take 1 tablet by mouth every 4 (four) hours as needed for moderate pain. 11/29/18   Dayson Aboud, Charline Bills, PA-C  insulin aspart (NOVOLOG) 100 UNIT/ML injection Inject 15 Units into the skin 3 (three) times daily as needed for high blood sugar.     [provider]  insulin glargine (LANTUS) 100 UNIT/ML injection Inject 0.37 mLs (37 Units total) into the skin daily. 09/07/14   Theodoro Grist, MD  losartan (COZAAR) 100 MG tablet Take 100 mg by  mouth daily.    [provider]  montelukast (SINGULAIR) 10 MG tablet Take 1 tablet (10 mg total) by mouth at bedtime. 10/13/18   Clapacs, Madie Reno, MD  Olopatadine HCl (PATADAY) 0.2 % SOLN Place 1 drop into both eyes daily. 02/15/15   [provider]  Omega-3 Fatty Acids (OMEGA 3 PO) Take 3 capsules by mouth daily.    [provider]  omeprazole (PRILOSEC) 40 MG capsule Take by mouth. Reported on 04/21/2015    [provider]  ondansetron (ZOFRAN ODT) 8 MG disintegrating tablet Take 1 tablet (8 mg total) by mouth every 8 (eight) hours as needed. 04/05/18   Norval Gable, MD  oseltamivir (TAMIFLU) 75 MG capsule Take 1 capsule (75 mg total) by mouth 2 (two) times daily. 04/05/18   Norval Gable, MD  predniSONE (DELTASONE) 20 MG tablet Take 1 tablet (20 mg total) by mouth daily. 04/05/18   Norval Gable, MD  pregabalin (LYRICA) 300 MG capsule Take 1 capsule (300 mg total) by mouth 2 (two) times  daily. 11/24/18   Clapacs, Madie Reno, MD  spironolactone (ALDACTONE) 25 MG tablet Take 1 tablet (25 mg total) by mouth 2 (two) times daily. 05/31/16   Lloyd Huger, MD  traZODone (DESYREL) 150 MG tablet Take 1 tablet (150 mg total) by mouth at bedtime. 11/24/18   Clapacs, Madie Reno, MD  vitamin B-12 (CYANOCOBALAMIN) 1000 MCG tablet Take 4,000 mcg by mouth daily.    [provider]    Allergies Morphine, Codeine, Latex, Morphine and related, Relafen [nabumetone], Nsaids, and Tape  Family History  Problem Relation Age of Onset  . Diabetes Mother   . Hypertension Mother   . Heart disease Mother   . Prostate cancer Father   . Heart disease Father   . Diabetes Brother     Social History Social History   Tobacco Use  . Smoking status: Never Smoker  . Smokeless tobacco: Never Used  . Tobacco comment: quit 1985  Substance Use Topics  . Alcohol use: No    Alcohol/week: 0.0 standard drinks  . Drug use: No     Review of Systems  Constitutional: No fever/chills Eyes: No visual changes. No discharge ENT: No upper respiratory complaints. Cardiovascular: no chest pain. Respiratory: no cough. No SOB. Gastrointestinal: No abdominal pain.  No nausea, no vomiting.  No diarrhea.  No constipation. Genitourinary: Negative for dysuria. No hematuria Musculoskeletal: Positive for ulceration to the plantar aspect of the foot, erythema and edema to the dorsal aspect of the foot. Skin: Negative for rash, abrasions, lacerations, ecchymosis. Neurological: Negative for headaches, focal weakness or numbness. 10-point ROS otherwise negative.  ____________________________________________   PHYSICAL EXAM:  VITAL SIGNS: ED Triage Vitals  Enc Vitals Group     BP 11/29/18 1656 138/78     Pulse Rate 11/29/18 1656 86     Resp 11/29/18 1656 20     Temp 11/29/18 1656 99.6 F (37.6 C)     Temp Source 11/29/18 1656 Oral     SpO2 11/29/18 1656 95 %     Weight 11/29/18 1656 300 lb (136.1 kg)      Height 11/29/18 1656 5\' 7"  (1.702 m)     Head Circumference --      Peak Flow --      Pain Score 11/29/18 1701 7     Pain Loc --      Pain Edu? --      Excl. in [place]? --  Constitutional: Alert and oriented. Well appearing and in no acute distress. Eyes: Conjunctivae are normal. PERRL. EOMI. Head: Atraumatic. ENT:      Ears:       Nose: No congestion/rhinnorhea.      Mouth/Throat: Mucous membranes are moist.  Neck: No stridor.    Cardiovascular: Normal rate, regular rhythm. Normal S1 and S2.  Good peripheral circulation. Respiratory: Normal respiratory effort without tachypnea or retractions. Lungs CTAB. Good air entry to the bases with no decreased or absent breath sounds. Musculoskeletal: Full range of motion to all extremities. No gross deformities appreciated.  Visualization of the right foot reveals ulceration to the plantar aspect between the second and third digit.  No active purulent drainage.  Minimal surrounding erythema.  On the dorsal aspect of the foot patient has erythema encompassing most of the midfoot with a faint streak extending into the distal tibial region.  This area is tender to palpation with deep palpation, light sensation is absent with peripheral neuropathy.  Dorsalis pedis pulse intact.  Capillary refill less than 2 seconds all digits.  Patient is missing toenail to the fifth digit which patient reports "just fell off."  No fluctuance or induration over ulceration or erythematous and edematous region dorsal foot.  Full range of motion to the ankle joint.  Full range of motion all digits.  Erythema is marked with indelible marker both to the dorsal and plantar aspects of the foot. Neurologic:  Normal speech and language. No gross focal neurologic deficits are appreciated.  Skin:  Skin is warm, dry and intact. No rash noted. Psychiatric: Mood and affect are normal. Speech and behavior are normal. Patient exhibits appropriate insight and  judgement.   ____________________________________________   LABS (all labs ordered are listed, but only abnormal results are displayed)  Labs Reviewed  GLUCOSE, CAPILLARY - Abnormal; Notable for the following components:      Result Value   Glucose-Capillary 242 (*)    All other components within normal limits  COMPREHENSIVE METABOLIC PANEL - Abnormal; Notable for the following components:   Sodium 132 (*)    Chloride 97 (*)    Glucose, Bld 219 (*)    Creatinine, Ser 1.11 (*)    AST 45 (*)    Total Bilirubin 1.3 (*)    GFR calc non Af Amer 53 (*)    All other components within normal limits  CBC WITH DIFFERENTIAL/PLATELET - Abnormal; Notable for the following components:   RBC 3.80 (*)    Hemoglobin 10.8 (*)    HCT 32.4 (*)    All other components within normal limits  CULTURE, BLOOD (ROUTINE X 2)  CULTURE, BLOOD (ROUTINE X 2)  LACTIC ACID, PLASMA  LACTIC ACID, PLASMA  URINALYSIS, COMPLETE (UACMP) WITH MICROSCOPIC   ____________________________________________  EKG   ____________________________________________  RADIOLOGY I personally viewed and evaluated these images as part of my medical decision making, as well as reviewing the written report by the radiologist.  Dg Foot Complete Right  Result Date: 11/29/2018 CLINICAL DATA:  63 year old female with history of right foot wound. History of diabetes. EXAM: RIGHT FOOT COMPLETE - 3+ VIEW COMPARISON:  No priors. FINDINGS: There is no evidence of fracture or dislocation. There is no evidence of arthropathy or other focal bone abnormality. Mild diffuse soft tissue swelling overlying the midfoot. IMPRESSION: 1. Mild diffuse soft tissue swelling overlying the midfoot. No underlying osseous abnormality. Electronically Signed   By: Vinnie Langton M.D.   On: 11/29/2018 19:23    ____________________________________________  PROCEDURES  Procedure(s) performed:    Procedures    Medications  sodium chloride 0.9 %  bolus 1,000 mL (1,000 mLs Intravenous New Bag/Given 11/29/18 2012)  piperacillin-tazobactam (ZOSYN) IVPB 3.375 g (3.375 g Intravenous New Bag/Given 11/29/18 2013)     ____________________________________________   INITIAL IMPRESSION / ASSESSMENT AND PLAN / ED COURSE  Pertinent labs & imaging results that were available during my care of the patient were reviewed by me and considered in my medical decision making (see chart for details).  Review of the [place] CSRS was performed in accordance of the [place] prior to dispensing any controlled drugs.           Patient's diagnosis is consistent with diabetic foot ulcer, cellulitis to the foot.  Patient presented to the emergency department with complaints of possible diabetic foot ulcer and cellulitis.  Visualization is concerning for both cellulitis as well as foot ulceration to the plantar aspect of the foot.  No history of previous foot ulcers.  Patient was evaluated with labs and imaging.  Thankfully labs are reassuring with no elevated white blood cell count or lactic acid.  X-ray reveals no evidence of osteomyelitis.  Patient is given Zosyn while here in the emergency department.  Given patient's reassuring labs, no fevers or chills, signs of sepsis, patient will be discharged with dual therapy antibiotics of clindamycin and Keflex.  I have instructed the patient that she must have close follow-up to ensure proper healing with this complaint.  Patient is to either return to this department if she cannot seek care at urgent care or primary care.  Patient verbalizes understanding of same.  Precautions to return sooner such as erythema extending past margins, fevers and chills, other systemic complaints are to return prior to repeat evaluation.  I have discussed that patient will likely need follow-up with wound care.  She verbalizes understanding of same. Patient is given ED precautions to return to the ED for any worsening or new  symptoms.     ____________________________________________  FINAL CLINICAL IMPRESSION(S) / ED DIAGNOSES  Final diagnoses:  Cellulitis of right foot  Diabetic ulcer of right midfoot associated with type 2 diabetes mellitus, limited to breakdown of skin ([place])      NEW MEDICATIONS STARTED DURING THIS VISIT:  ED Discharge Orders         Ordered    clindamycin (CLEOCIN) 300 MG capsule  4 times daily     11/29/18 2120    cephALEXin (KEFLEX) 500 MG capsule  4 times daily     11/29/18 2120    HYDROcodone-acetaminophen (NORCO/VICODIN) 5-325 MG tablet  Every 4 hours PRN     11/29/18 2120              This chart was dictated using voice recognition software/Dragon. Despite best efforts to proofread, errors can occur which can change the meaning. Any change was purely unintentional.    Darletta Moll, PA-C 11/29/18 2120    Lilia Pro., MD 11/30/18 [phone]

## 2018-11-29 NOTE — ED Notes (Signed)
Pt states unknown injury to her rt foot. Instructed to remove her sock and shoe for exam after ambulating to the bed. Pt also c/o pain in various areas from chronic pain.

## 2018-11-29 NOTE — ED Triage Notes (Signed)
Pt to the ER for an open wound to the bottom of her right foot. Pt is a diabetic and has not been taking care of herself as she is taking care of her husband who is on home hospice.

## 2018-11-30 NOTE — ED Notes (Signed)
Pt found in OTF att; DC'd att; pt not seen by this RN

## 2018-12-04 LAB — CULTURE, BLOOD (ROUTINE X 2)
Culture: NO GROWTH
Culture: NO GROWTH
Special Requests: ADEQUATE
Special Requests: ADEQUATE

## 2018-12-25 ENCOUNTER — Inpatient Hospital Stay: Payer: Self-pay | Admitting: Hematology and Oncology

## 2018-12-25 NOTE — Progress Notes (Signed)
Clinic Day: 12/28/2018   Referring physician: Kerri Perches, PA-C   Chief Complaint: [name] is a 63 y.o. female with a chronic myelogenous leukemia (CML) who is referred in consultation by Kerri Perches PA-C for assessment and management.   HPI: The patient was diagnosed with CML in 2011.  She presented with an elevated white blood cell count 144,000.  Bone marrow on 11/2009 revealed CML in chronic phase with no increase in blasts (4%).  B3a2 transcript was 60% (p210).    She began treatment withTasigna (nilotinib) 300 mg po BID in 11/2009.  BCR-ABL transcript was 0 in 02/2011 and 0.186% in 07/2011.  Because of weight gain and intolerance to nilotinib, dose was reduced to 150 mg p.o. BID.  She describes symptoms of CHF.  In 12/2011, b3a2 transcript was 0.11%.  Nilotinib was increased to 600 mg p.o. BID in 02/2012.  In 05/2012, patient was intubated in the CCU for possible aspiration versus narcotic effects, cardiac arrest and septic shock.  She was intubated for 13 days.  Nilotinib was held.  BCR-ABL was 0.004% in 04/2012.  Nilotinib was restarted at 200 mg p.o. daily with dose escalation beginning 06/2012.  BCR-ABL was 0.55% in 08/2012.  She began bosutinib 400 mg a day beginning 03/2013.  She states that she tolerated this well without side effects.  BCR-ABLby PCR was followed: 0.012% in 06/2015, 0 in 09/2015, 0 in 02/2016, 0 in 04/2016, 0.125% in 08/2016 and 0 in 12/2016.  She was initially seen by Dr Ma Hillock and Dr Grayland Ormond.  She was last seen in the medical oncology clinic by Dr. Delight Hoh on 10/11/2016. At that time, she had missed several appointments. She was taking bosutinib 400 mg a day. She had not missed any doses but the records indicated she had stopped and restarted medications without medical input in the past. She has a history of noncompliance. Concern was raised about an increasing BCR-ABL transcript. Plan was for likely referral for second opinion.   She was last  seen by Dr Dellis Filbert at [facility] on 06/12/2017.  At that time, she was on bosutinib 400 mg one a day.  She had several symptoms and medical/psychiatric issues.  She was had also become symptomatic from anxiety/hyperglycemia and was sent to the infusion center for evaluation, hydration and insulin.  She states that she continued bosutinib until she lost insurance a year ago (08/2017).  She states that her husband also had a stroke while driving an [id number].  He is on hospice.  CBC on 11/29/2018 included a hematocrit 32.4, hemoglobin 10.8, platelets 236,000, white count 7600 with an [place] of 5700. Creatinine was 1.11. LFTs included an AST of 45, ALT 16, bilirubin 1.3 and alkaline phosphatase 68.   The patient was seen on 12/12/2018 at [facility] in [place]. She has been referred for management of CML.  She has not been on any treatment for her CML since she lost her insurance.  She has cellulitis in her right foot. She sees Kerri Perches, [place], her PCP, tomorrow about cellulitis. She has been on 4 different courses of antibiotic; this will be her 5th course.   She is extremely fatigued and takes 4 hour naps a day which is unusual for her.        Past Medical History:  Diagnosis Date  . CML (chronic myelocytic leukemia) ([place])   . Depression   . DM type 2 (diabetes mellitus, type 2) ([place])   . Environmental allergies   .  History of gastric ulcer   . HLD (hyperlipidemia)   . IDA (iron deficiency anemia)    chronic  . Leukemia ([place])   . Leukemia, chronic myeloid ([place])   . Osteoarthritis   . Panic disorder   . PSS (progressive systemic sclerosis) ([place])   . Unspecified essential hypertension          Past Surgical History:  Procedure Laterality Date  . back and neck surgery  1999, 2009   had rods placed  . [facility]) No blackages found.   . CARPAL TUNNEL RELEASE    . PARTIAL HYSTERECTOMY     age 81, no  cancer         Family History  Problem Relation Age of Onset  . Diabetes Mother   . Hypertension Mother   . Heart disease Mother   . Prostate cancer Father   . Heart disease Father   . Diabetes Brother    Social History: .  She has never smoked. She has never used smokeless tobacco. She reports that she does not drink alcohol or use drugs. Her husband had a CVA while driving an [id number].  He is on Hospice.  She receives spousal benefit but doesn't qualify for Medicaid. 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  She previously lived in [place].  She lives in [place].  The patient is alone today.   Allergies:       Allergies  Allergen Reactions  . Morphine Rash    Headache too  . Codeine Other (See Comments)    Reaction: Unknown   . Latex Other (See Comments)    Reaction: Unknown   . Morphine And Related Other (See Comments)    Reaction: Unknown   . Relafen [Nabumetone] Hives  . Nsaids Rash  . Tape Other (See Comments) and Rash    Reaction: Unknown           Current Outpatient Medications:  Medication Sig Dispense Refill  . ACCU-CHEK GUIDE test strip TEST ONCE DAILY AND AS DIRECTED.  3  . ACCU-CHEK SOFTCLIX LANCETS lancets Use 1 each 3 (three) times daily. Use as instructed.    Marland Kitchen albuterol (PROVENTIL HFA;VENTOLIN HFA) 108 (90 BASE) MCG/ACT inhaler Inhale 2 puffs into the lungs every 6 (six) hours as needed for wheezing or shortness of breath.    Marland Kitchen albuterol (PROVENTIL) (2.5 MG/3ML) 0.083% nebulizer solution INHALE CONTENTS OF 1 VIAL VIA NEBULIZER THREE TIMES A DAY prn    . ALPRAZolam (XANAX) 1 MG tablet Take 1 tablet (1 mg total) by mouth 3 (three) times daily. 90 tablet 5  . Ascorbic Acid (VITAMIN C) 1000 MG tablet Take 3,000 mg by mouth daily.    . Blood Glucose Monitoring Suppl (GLUCOCOM BLOOD GLUCOSE MONITOR) DEVI Use as directed.    . bosutinib (BOSULIF) 100 MG tablet Take 4 tablets (400 mg total) by mouth daily. 90 tablet 5   . celecoxib (CELEBREX) 200 MG capsule TAKE 1 CAPSULE BY MOUTH TWO TIMES A DAY 40 capsule 0  . cephALEXin (KEFLEX) 500 MG capsule Take 1 capsule (500 mg total) by mouth 4 (four) times daily. 28 capsule 0  . cetirizine (ZYRTEC) 10 MG tablet Take 20 mg by mouth daily.     . Cholecalciferol (VITAMIN D PO) Take 1 tablet by mouth daily.    . clindamycin (CLEOCIN) 300 MG capsule Take 1 capsule (300 mg total) by  mouth 4 (four) times daily. 28 capsule 0  . co-enzyme Q-10 50 MG capsule Take 50 mg by mouth daily.    . diphenhydrAMINE (BENADRYL) 25 MG tablet Take 25 mg by mouth every 6 (six) hours as needed for itching or allergies.     Marland Kitchen doxycycline (VIBRA-TABS) 100 MG tablet Take 1 tablet (100 mg total) by mouth 2 (two) times daily. 20 tablet 0  . DULoxetine (CYMBALTA) 20 MG capsule Take 1 capsule (20 mg total) by mouth 3 (three) times daily. 90 capsule 5  . fluconazole (DIFLUCAN) 150 MG tablet Take 1 tablet (150 mg total) by mouth daily. 1 tablet 0  . fluticasone (FLONASE) 50 MCG/ACT nasal spray Place 1-2 sprays into both nostrils as needed for rhinitis.     . furosemide (LASIX) 40 MG tablet Take 1 tablet (40 mg total) by mouth daily. 30 tablet 3  . HYDROcodone-acetaminophen (NORCO/VICODIN) 5-325 MG tablet Take 1 tablet by mouth every 4 (four) hours as needed for moderate pain. 12 tablet 0  . insulin aspart (NOVOLOG) 100 UNIT/ML injection Inject 15 Units into the skin 3 (three) times daily as needed for high blood sugar.     . insulin glargine (LANTUS) 100 UNIT/ML injection Inject 0.37 mLs (37 Units total) into the skin daily. 10 mL 11  . losartan (COZAAR) 100 MG tablet Take 100 mg by mouth daily.    . montelukast (SINGULAIR) 10 MG tablet Take 1 tablet (10 mg total) by mouth at bedtime. 30 tablet 5  . Olopatadine HCl (PATADAY) 0.2 % SOLN Place 1 drop into both eyes daily.    . Omega-3 Fatty Acids (OMEGA 3 PO) Take 3 capsules by mouth daily.    Marland Kitchen omeprazole (PRILOSEC) 40 MG capsule Take by mouth. Reported  on 04/21/2015    . ondansetron (ZOFRAN ODT) 8 MG disintegrating tablet Take 1 tablet (8 mg total) by mouth every 8 (eight) hours as needed. 6 tablet 0  . oseltamivir (TAMIFLU) 75 MG capsule Take 1 capsule (75 mg total) by mouth 2 (two) times daily. 10 capsule 0  . predniSONE (DELTASONE) 20 MG tablet Take 1 tablet (20 mg total) by mouth daily. 5 tablet 0  . pregabalin (LYRICA) 300 MG capsule Take 1 capsule (300 mg total) by mouth 2 (two) times daily. 60 capsule 5  . spironolactone (ALDACTONE) 25 MG tablet Take 1 tablet (25 mg total) by mouth 2 (two) times daily. 60 tablet 5  . traZODone (DESYREL) 150 MG tablet Take 1 tablet (150 mg total) by mouth at bedtime. 30 tablet 5  . vitamin B-12 (CYANOCOBALAMIN) 1000 MCG tablet Take 4,000 mcg by mouth daily.     No current facility-administered medications for this visit.     Review of Systems  Constitutional: Negative for chills, fever, malaise/fatigue and weight loss.       Notes "very bad energy" level.  Takes 3-4 hour naps.  HENT: Positive for congestion. Negative for ear discharge, ear pain, hearing loss, nosebleeds, sinus pain, sore throat and tinnitus.        Rhinorrhea.  Eyes: Negative for blurred vision and double vision.  Respiratory: Positive for cough (dry since aspiration event). Negative for sputum production and wheezing.   Cardiovascular: Positive for chest pain. Negative for palpitations and PND.       H/o CHF.  Gastrointestinal: Negative for blood in stool, diarrhea, nausea and vomiting.  Genitourinary: Positive for urgency. Negative for dysuria and hematuria.       Stress incontinence.  Musculoskeletal: Positive  for back pain (rods in spine), joint pain (knees, shoulders, ankles, wrists) and myalgias.       Scoliosis.  Skin: Positive for rash.       Right lower extremity cellulitis.  Neurological: Positive for sensory change (numbness in feet on Lyrica) and focal weakness (issues with balance). Negative for dizziness, tremors,  speech change, seizures, weakness and headaches.  Psychiatric/Behavioral: Positive for memory loss (poor short term memory). Negative for substance abuse and suicidal ideas.   Performance status (ECOG): 1 - Symptomatic but completely ambulatory   Vitals  Blood pressure 116/80, pulse 74, temperature (!) 96.2 F (35.7 C), temperature source Tympanic, resp. rate 18, height 5\' 7"  (1.702 m), weight 285 lb 4.4 oz (129.4 kg), SpO2 98 %.  Physical Exam  Constitutional: She appears well-developed and well-nourished. No distress.  HENT:  Head: Normocephalic and atraumatic.  Mouth/Throat: Oropharynx is clear and moist. No oropharyngeal exudate.  Graying hair pulled back.  Mask.  Eyes: Pupils are equal, round, and reactive to light. Conjunctivae and EOM are normal. No scleral icterus.  Dark rimmed glasses.  Blue eyes.  Neck: Normal range of motion. No JVD present.  Cardiovascular: Normal rate, regular rhythm and normal heart sounds. Exam reveals no gallop and no friction rub.  No murmur heard. Pulmonary/Chest: Effort normal and breath sounds normal. No respiratory distress. She has no wheezes. She has no rales.  Abdominal: Soft. Bowel sounds are normal. She exhibits no distension and no mass. There is no hepatosplenomegaly. There is abdominal tenderness (mild). There is no rebound and no guarding.  Musculoskeletal: Normal range of motion.        General: No edema.     Right shoulder: She exhibits bony tenderness.     Left shoulder: She exhibits bony tenderness.  Lymphadenopathy:    She has no cervical adenopathy.  Neurological: She is alert.  Skin: Skin is warm and dry. She is not diaphoretic.  Right foot cellulitis.  Psychiatric: She has a normal mood and affect. Her behavior is normal. Judgment and thought content normal.  Nursing note and vitals reviewed.   Labs:         No visits with results within 3 Day(s) from this visit.  Latest known visit with results is:  Admission on  11/29/2018, Discharged on 11/30/2018  Component Date Value Ref Range Status   . Glucose-Capillary 11/29/2018 242* 70 - 99 mg/dL Final  . Comment 1 11/29/2018 Notify RN   Final  . Comment 2 11/29/2018 Document in Chart   Final  . Sodium 11/29/2018 132* 135 - 145 mmol/L Final  . Potassium 11/29/2018 4.5  3.5 - 5.1 mmol/L Final   HEMOLYSIS AT THIS LEVEL MAY AFFECT RESULT  . Chloride 11/29/2018 97* 98 - 111 mmol/L Final  . CO2 11/29/2018 24  22 - 32 mmol/L Final  . Glucose, Bld 11/29/2018 219* 70 - 99 mg/dL Final  . BUN 11/29/2018 20  8 - 23 mg/dL Final  . Creatinine, Ser 11/29/2018 1.11* 0.44 - 1.00 mg/dL Final  . Calcium 11/29/2018 9.1  8.9 - 10.3 mg/dL Final  . Total Protein 11/29/2018 7.5  6.5 - 8.1 g/dL Final  . Albumin 11/29/2018 3.6  3.5 - 5.0 g/dL Final  . AST 11/29/2018 45* 15 - 41 U/L Final   HEMOLYSIS AT THIS LEVEL MAY AFFECT RESULT  . ALT 11/29/2018 16  0 - 44 U/L Final   HEMOLYSIS AT THIS LEVEL MAY AFFECT RESULT  . Alkaline Phosphatase 11/29/2018 68  38 - 126  U/L Final  . Total Bilirubin 11/29/2018 1.3* 0.3 - 1.2 mg/dL Final   HEMOLYSIS AT THIS LEVEL MAY AFFECT RESULT  . GFR calc non Af Amer 11/29/2018 53* >60 mL/min Final  . GFR calc Af Amer 11/29/2018 >60  >60 mL/min Final  . Anion gap 11/29/2018 11  5 - 15 Final   Performed at [facility], [address] [place], [place] [address]  . WBC 11/29/2018 7.6  4.0 - 10.5 K/uL Final  . RBC 11/29/2018 3.80* 3.87 - 5.11 MIL/uL Final  . Hemoglobin 11/29/2018 10.8* 12.0 - 15.0 g/dL Final  . HCT 11/29/2018 32.4* 36.0 - 46.0 % Final  . MCV 11/29/2018 85.3  80.0 - 100.0 fL Final  . MCH 11/29/2018 28.4  26.0 - 34.0 pg Final  . MCHC 11/29/2018 33.3  30.0 - 36.0 g/dL Final  . RDW 11/29/2018 14.6  11.5 - 15.5 % Final  . Platelets 11/29/2018 236  150 - 400 K/uL Final  . nRBC 11/29/2018 0.0  0.0 - 0.2 % Final  . Neutrophils Relative % 11/29/2018 76  % Final  . Neutro Abs 11/29/2018 5.7  1.7 - 7.7 K/uL Final  . Lymphocytes  Relative 11/29/2018 11  % Final  . Lymphs Abs 11/29/2018 0.9  0.7 - 4.0 K/uL Final  . Monocytes Relative 11/29/2018 10  % Final  . Monocytes Absolute 11/29/2018 0.8  0.1 - 1.0 K/uL Final  . Eosinophils Relative 11/29/2018 3  % Final  . Eosinophils Absolute 11/29/2018 0.2  0.0 - 0.5 K/uL Final  . Basophils Relative 11/29/2018 0  % Final  . Basophils Absolute 11/29/2018 0.0  0.0 - 0.1 K/uL Final  . Immature Granulocytes 11/29/2018 0  % Final  . Abs Immature Granulocytes 11/29/2018 0.03  0.00 - 0.07 K/uL Final   Performed at [facility], [address] [place], [place] [address]  . Lactic Acid, Venous 11/29/2018 0.9  0.5 - 1.9 mmol/L Final   Performed at [facility], [address] [place], [place] [address]  . Specimen Description 11/29/2018 BLOOD LEFT ANTECUBITAL   Final  . Special Requests 11/29/2018 BOTTLES DRAWN AEROBIC AND ANAEROBIC Blood Culture adequate volume   Final  . Culture 11/29/2018   Final  Value:NO GROWTH 5 DAYS  Performed at [facility], [address] [place], [place] [address]   . Report Status 11/29/2018 12/04/2018 FINAL   Final  . Specimen Description 11/29/2018 BLOOD RIGHT ANTECUBITAL   Final  . Special Requests 11/29/2018 BOTTLES DRAWN AEROBIC AND ANAEROBIC Blood Culture adequate volume   Final  . Culture 11/29/2018   Final  Value:NO GROWTH 5 DAYS  Performed at [facility], [address], [place], [place] [address]   . Report Status 11/29/2018 12/04/2018 FINAL   Final    Assessment: Tara Levine is a 63 y.o. female with chronic myelogenous leukemia (CML- CP). She presented with a WBC of 144,000.  Bone marrow on 11/2009 revealed CML in chronic phase with no increase in blast (4%).  B3a2 transcript was 60% (p210).    She began treatment withTasigna (nilotinib) 300 mg po BID in 11/2009.  Dose fluctuated secondary to tolerance issues (weight gain, CHF).  BCR-ABL was followed:0 in 02/2011 and 0.186% in 07/2011 0.11% in  12/2011.  She was intubated in 05/2012 with the CCU for possible aspiration versus narcotic effects, cardiac arrest and septic shock. Nilotinib was held.  BCR-ABL was followed:  0.004% in 04/2012.  Nilotinib was restarted at 200 mg p.o. daily  with dose escalation beginning 06/2012.  BCR-ABL was 0.55% in 08/2012.  She began Bosulif (bosutinib) 400 mg a day beginning 03/2013.  She denied any side effects.  BCR-ABL was followed: 0.012% in 06/2015, 0 in 09/2015, 0 in 02/2016, 0 in 04/2016, 0.125% in 08/2016 and 0 in 12/2016.  She was last on bosutinib in 08/2017.  CBC on 11/29/2018 included a hematocrit 32.4, hemoglobin 10.8, platelets 236,000, white count 7600 with an [place] of 5700.  She has persistent cellulitis of the right foot.  Initially her cellulitis had extended proximally.  She has been on 4 different courses of antibiotic; this will be her 5th course.   Symptomatically, she is extremely fatigued and takes 4 hour naps a day.  She denies any fevers, sweats or weight loss.  Plan:  1.   Labs today: CBC with diff, CMP, BCR-ABL. 2.   Chronic myelogenous leukemia (CML).  Review entire medical history, diagnosis and management of CML.   She was initially treated with Tasigna (nilotinib) from 11/2009 - 02/2012.   She was intolerant with symptoms of CHF, fluid retention.  She was treated with (bosutinib) from 102/2015 - 08/2017.   She tolerated well.  She discontinued secondary to lack of insurance.  Discuss plan for baseline labs and likely reinstitution of bosutinib. 3.  Contact Nuala Alpha, oral chemotherapy pharmacist regarding bosutinib approval.  4.   RTC after bosutinib delivered for MD assess and initiation of therapy.   I discussed the assessment and treatment plan with the patient. The patient was provided an opportunity to ask questions and all were answered. The patient agreed with the plan and demonstrated an understanding of the instructions. The patient was advised to call back  if the symptoms worsen or if the condition fails to improve as anticipated.    Melissa C. Mike Gip, MD, PhD  12/28/2018, 4:15 PM    I, Samul Dada, am acting as a scribe for Lequita Asal, MD.  I, [place] Mike Gip, MD, have reviewed the above documentation for accuracy and completeness, and I agree with the above.

## 2018-12-28 ENCOUNTER — Inpatient Hospital Stay: Payer: Self-pay

## 2018-12-28 ENCOUNTER — Encounter: Payer: Self-pay | Admitting: Hematology and Oncology

## 2018-12-28 ENCOUNTER — Inpatient Hospital Stay: Payer: Self-pay | Attending: Hematology and Oncology | Admitting: Hematology and Oncology

## 2018-12-28 ENCOUNTER — Other Ambulatory Visit: Payer: Self-pay

## 2018-12-28 VITALS — BP 116/80 | HR 74 | Temp 96.2°F | Resp 18 | Ht 67.0 in | Wt 285.3 lb

## 2018-12-28 DIAGNOSIS — Z79899 Other long term (current) drug therapy: Secondary | ICD-10-CM | POA: Insufficient documentation

## 2018-12-28 DIAGNOSIS — Z9119 Patient's noncompliance with other medical treatment and regimen: Secondary | ICD-10-CM | POA: Insufficient documentation

## 2018-12-28 DIAGNOSIS — Z8249 Family history of ischemic heart disease and other diseases of the circulatory system: Secondary | ICD-10-CM | POA: Insufficient documentation

## 2018-12-28 DIAGNOSIS — E119 Type 2 diabetes mellitus without complications: Secondary | ICD-10-CM | POA: Insufficient documentation

## 2018-12-28 DIAGNOSIS — I110 Hypertensive heart disease with heart failure: Secondary | ICD-10-CM | POA: Insufficient documentation

## 2018-12-28 DIAGNOSIS — C9210 Chronic myeloid leukemia, BCR/ABL-positive, not having achieved remission: Secondary | ICD-10-CM | POA: Insufficient documentation

## 2018-12-28 DIAGNOSIS — R5383 Other fatigue: Secondary | ICD-10-CM | POA: Insufficient documentation

## 2018-12-28 DIAGNOSIS — I509 Heart failure, unspecified: Secondary | ICD-10-CM | POA: Insufficient documentation

## 2018-12-28 DIAGNOSIS — L03115 Cellulitis of right lower limb: Secondary | ICD-10-CM | POA: Insufficient documentation

## 2018-12-28 DIAGNOSIS — E785 Hyperlipidemia, unspecified: Secondary | ICD-10-CM | POA: Insufficient documentation

## 2018-12-28 DIAGNOSIS — Z794 Long term (current) use of insulin: Secondary | ICD-10-CM | POA: Insufficient documentation

## 2018-12-28 DIAGNOSIS — F329 Major depressive disorder, single episode, unspecified: Secondary | ICD-10-CM | POA: Insufficient documentation

## 2018-12-28 DIAGNOSIS — Z791 Long term (current) use of non-steroidal anti-inflammatories (NSAID): Secondary | ICD-10-CM | POA: Insufficient documentation

## 2018-12-28 LAB — CBC WITH DIFFERENTIAL/PLATELET
Abs Immature Granulocytes: 0.01 10*3/uL (ref 0.00–0.07)
Basophils Absolute: 0 10*3/uL (ref 0.0–0.1)
Basophils Relative: 0 %
Eosinophils Absolute: 0.1 10*3/uL (ref 0.0–0.5)
Eosinophils Relative: 3 %
HCT: 38 % (ref 36.0–46.0)
Hemoglobin: 12.9 g/dL (ref 12.0–15.0)
Immature Granulocytes: 0 %
Lymphocytes Relative: 30 %
Lymphs Abs: 1.3 10*3/uL (ref 0.7–4.0)
MCH: 28.7 pg (ref 26.0–34.0)
MCHC: 33.9 g/dL (ref 30.0–36.0)
MCV: 84.6 fL (ref 80.0–100.0)
Monocytes Absolute: 0.5 10*3/uL (ref 0.1–1.0)
Monocytes Relative: 11 %
Neutro Abs: 2.4 10*3/uL (ref 1.7–7.7)
Neutrophils Relative %: 56 %
Platelets: 234 10*3/uL (ref 150–400)
RBC: 4.49 MIL/uL (ref 3.87–5.11)
RDW: 14.1 % (ref 11.5–15.5)
WBC: 4.3 10*3/uL (ref 4.0–10.5)
nRBC: 0 % (ref 0.0–0.2)

## 2018-12-28 LAB — COMPREHENSIVE METABOLIC PANEL
ALT: 21 U/L (ref 0–44)
AST: 25 U/L (ref 15–41)
Albumin: 3.8 g/dL (ref 3.5–5.0)
Alkaline Phosphatase: 82 U/L (ref 38–126)
Anion gap: 11 (ref 5–15)
BUN: 18 mg/dL (ref 8–23)
CO2: 26 mmol/L (ref 22–32)
Calcium: 9.7 mg/dL (ref 8.9–10.3)
Chloride: 100 mmol/L (ref 98–111)
Creatinine, Ser: 1.01 mg/dL — ABNORMAL HIGH (ref 0.44–1.00)
GFR calc Af Amer: >60 mL/min (ref >60)
GFR calc non Af Amer: 59 mL/min — ABNORMAL LOW (ref >60)
Glucose, Bld: 157 mg/dL — ABNORMAL HIGH (ref 70–99)
Potassium: 4.2 mmol/L (ref 3.5–5.1)
Sodium: 137 mmol/L (ref 135–145)
Total Bilirubin: 0.5 mg/dL (ref 0.3–1.2)
Total Protein: 7.4 g/dL (ref 6.5–8.1)

## 2019-01-03 LAB — BCR-ABL1, CML/ALL, PCR, QUANT

## 2019-01-11 ENCOUNTER — Ambulatory Visit: Payer: Self-pay | Admitting: Physician Assistant

## 2019-01-11 NOTE — Progress Notes (Signed)
[facility]  [address], [address] [place], [place] [address] Phone: [phone]  Fax: [phone]   Clinic Day:  01/13/2019  Referring physician: Kerri Perches, PA-C  Chief Complaint: Tara Levine is a 63 y.o. female with a chronic myelogenous leukemia (CML) who is seen for 2 week assessment.  HPI: The patient was last seen in the medical oncology clinic for initial consultation on 12/28/2018.  At that time, she was extremely fatigued.  She described taking 4 hour naps a day. She denied any fevers, sweats or weight loss. Hematocrit was 38.1, hemoglobin 12.9, MCV 84.6, platelets 234,000, white count 4300 with an ANC of 2400. Creatinine was 1.01. BCR-ABL was negative. I discussed a plan for baseline labs and likely reinstitution of bosutinib.    During the interim, she has not felt good.  She reports having hot flashes all the time. She has a cough and shortness of breath. She continues to feel exhausted secondary to caring for her husband. She has cellulitis on her right foot. She will have a bandage change today. She continues to have back and joint pain. She continues to have stress incontinence.   Patient is upset and tearful because of her CHF. She notes weight gain.    Past Medical History:  Diagnosis Date  . CML (chronic myelocytic leukemia) ([place])   . Depression   . DM type 2 (diabetes mellitus, type 2) ([place])   . Environmental allergies   . History of gastric ulcer   . HLD (hyperlipidemia)   . IDA (iron deficiency anemia)    chronic  . Leukemia ([place])   . Leukemia, chronic myeloid ([place])   . Osteoarthritis   . Panic disorder   . PSS (progressive systemic sclerosis) ([place])   . Unspecified essential hypertension     Past Surgical History:  Procedure Laterality Date  . back and neck surgery  1999, 2009   had rods placed  . [facility]) No blackages found.   . CARPAL TUNNEL RELEASE    .  PARTIAL HYSTERECTOMY     age 72, no cancer    Family History  Problem Relation Age of Onset  . Diabetes Mother   . Hypertension Mother   . Heart disease Mother   . Prostate cancer Father   . Heart disease Father   . Diabetes Brother     Social History:  reports that she has never smoked. She has never used smokeless tobacco. She reports that she does not drink alcohol or use drugs. Her husband had a CVA while driving an [id number].  He is on Hospice.  She receives spousal benefit but doesn't qualify for Medicaid. 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  She previously lived in [place].  She lives in [place]. The patient is accompanied by her youngest son, Elberta Fortis, today.  Allergies:  Allergies  Allergen Reactions  . Morphine Rash    Headache too  . Codeine Other (See Comments)    Reaction: Unknown   . Latex Other (See Comments)    Reaction:  Unknown   . Morphine And Related Other (See Comments)    Reaction:  Unknown   . Relafen [Nabumetone] Hives  . Nsaids Rash  . Tape Other (See Comments) and Rash    Reaction:  Unknown     Current Medications: Current Outpatient Medications  Medication Sig Dispense Refill  . ACCU-CHEK GUIDE test  strip TEST ONCE DAILY AND AS DIRECTED.  3  . ACCU-CHEK SOFTCLIX LANCETS lancets Use 1 each 3 (three) times daily. Use as instructed.    Marland Kitchen albuterol (PROVENTIL) (2.5 MG/3ML) 0.083% nebulizer solution INHALE CONTENTS OF 1 VIAL VIA NEBULIZER THREE TIMES A DAY prn    . ALPRAZolam (XANAX) 1 MG tablet Take 1 tablet (1 mg total) by mouth 3 (three) times daily. 90 tablet 5  . Ascorbic Acid (VITAMIN C) 1000 MG tablet Take 3,000 mg by mouth daily.    . Blood Glucose Monitoring Suppl (GLUCOCOM BLOOD GLUCOSE MONITOR) DEVI Use as directed.    . Cholecalciferol (VITAMIN D PO) Take 1 tablet by mouth daily.    Marland Kitchen co-enzyme Q-10 50 MG capsule Take 50 mg by mouth daily.    . DULoxetine (CYMBALTA) 20 MG capsule Take 1  capsule (20 mg total) by mouth 3 (three) times daily. 90 capsule 5  . furosemide (LASIX) 40 MG tablet Take 1 tablet (40 mg total) by mouth daily. 30 tablet 3  . insulin aspart (NOVOLOG) 100 UNIT/ML injection Inject 15 Units into the skin 3 (three) times daily as needed for high blood sugar.     . insulin glargine (LANTUS) 100 UNIT/ML injection Inject 0.37 mLs (37 Units total) into the skin daily. 10 mL 11  . montelukast (SINGULAIR) 10 MG tablet Take 1 tablet (10 mg total) by mouth at bedtime. 30 tablet 5  . Olopatadine HCl (PATADAY) 0.2 % SOLN Place 1 drop into both eyes daily.    . Omega-3 Fatty Acids (OMEGA 3 PO) Take 3 capsules by mouth daily.    . pregabalin (LYRICA) 300 MG capsule Take 1 capsule (300 mg total) by mouth 2 (two) times daily. 60 capsule 5  . traZODone (DESYREL) 150 MG tablet Take 1 tablet (150 mg total) by mouth at bedtime. 30 tablet 5  . vitamin B-12 (CYANOCOBALAMIN) 1000 MCG tablet Take 4,000 mcg by mouth daily.    Marland Kitchen albuterol (PROVENTIL HFA;VENTOLIN HFA) 108 (90 BASE) MCG/ACT inhaler Inhale 2 puffs into the lungs every 6 (six) hours as needed for wheezing or shortness of breath.    . bosutinib (BOSULIF) 100 MG tablet Take 4 tablets (400 mg total) by mouth daily. (Patient not taking: Reported on 12/28/2018) 90 tablet 5  . celecoxib (CELEBREX) 200 MG capsule TAKE 1 CAPSULE BY MOUTH TWO TIMES A DAY (Patient not taking: Reported on 01/12/2019) 40 capsule 0  . cephALEXin (KEFLEX) 500 MG capsule Take 1 capsule (500 mg total) by mouth 4 (four) times daily. (Patient not taking: Reported on 12/28/2018) 28 capsule 0  . cetirizine (ZYRTEC) 10 MG tablet Take 20 mg by mouth daily.     . clindamycin (CLEOCIN) 300 MG capsule Take 1 capsule (300 mg total) by mouth 4 (four) times daily. (Patient not taking: Reported on 12/28/2018) 28 capsule 0  . diphenhydrAMINE (BENADRYL) 25 MG tablet Take 25 mg by mouth every 6 (six) hours as needed for itching or allergies.     Marland Kitchen doxycycline (VIBRA-TABS) 100 MG  tablet Take 1 tablet (100 mg total) by mouth 2 (two) times daily. (Patient not taking: Reported on 12/28/2018) 20 tablet 0  . fluconazole (DIFLUCAN) 150 MG tablet Take 1 tablet (150 mg total) by mouth daily. (Patient not taking: Reported on 12/28/2018) 1 tablet 0  . fluticasone (FLONASE) 50 MCG/ACT nasal spray Place 1-2 sprays into both nostrils as needed for rhinitis.     Marland Kitchen HYDROcodone-acetaminophen (NORCO/VICODIN) 5-325 MG tablet Take 1 tablet by mouth  every 4 (four) hours as needed for moderate pain. (Patient not taking: Reported on 12/28/2018) 12 tablet 0  . losartan (COZAAR) 100 MG tablet Take 100 mg by mouth daily.    Marland Kitchen omeprazole (PRILOSEC) 40 MG capsule Take by mouth. Reported on 04/21/2015    . ondansetron (ZOFRAN ODT) 8 MG disintegrating tablet Take 1 tablet (8 mg total) by mouth every 8 (eight) hours as needed. (Patient not taking: Reported on 12/28/2018) 6 tablet 0  . oseltamivir (TAMIFLU) 75 MG capsule Take 1 capsule (75 mg total) by mouth 2 (two) times daily. (Patient not taking: Reported on 12/28/2018) 10 capsule 0  . predniSONE (DELTASONE) 20 MG tablet Take 1 tablet (20 mg total) by mouth daily. (Patient not taking: Reported on 12/28/2018) 5 tablet 0  . spironolactone (ALDACTONE) 25 MG tablet Take 1 tablet (25 mg total) by mouth 2 (two) times daily. (Patient not taking: Reported on 12/28/2018) 60 tablet 5   No current facility-administered medications for this visit.     Review of Systems  Constitutional: Positive for malaise/fatigue (exhausted secondary to caring for husband) and weight loss (1 pound). Negative for chills and fever.       She does not feel good.  HENT: Negative for congestion, ear pain, hearing loss, nosebleeds, sinus pain, sore throat and tinnitus.   Eyes: Negative.  Negative for blurred vision, double vision and photophobia.  Respiratory: Positive for cough (dry since aspiration event) and shortness of breath. Negative for hemoptysis, sputum production and wheezing.    Cardiovascular: Negative for chest pain, palpitations and PND.       CHF.  Gastrointestinal: Negative.  Negative for blood in stool, diarrhea, nausea and vomiting.  Genitourinary: Negative for dysuria, hematuria and urgency.       Stress incontinence.  Musculoskeletal: Positive for back pain (rods in spine) and joint pain (knees, shoulders, ankles, wrists). Negative for myalgias.       Scoliosis.  Skin: Negative for rash.       Right lower extremity cellulitis.  Neurological: Positive for sensory change (numbness in feet on Lyrica). Negative for dizziness, tremors, speech change, seizures, weakness and headaches. Focal weakness: issues with balance.  Endo/Heme/Allergies: Negative.  Does not bruise/bleed easily.  Psychiatric/Behavioral: Positive for memory loss (poor short term memory). Negative for substance abuse and suicidal ideas.       Tearful because of health condition.  All other systems reviewed and are negative.  Performance status (ECOG): 1  Vitals Blood pressure (!) 144/74, pulse 78, temperature 97.7 F (36.5 C), temperature source Tympanic, resp. rate 18, height 5' 7" (1.702 m), weight 266 lb 13.9 oz (121.1 kg), SpO2 96 %.  Physical Exam  Constitutional: She is oriented to person, place, and time. She appears well-developed and well-nourished. No distress.  HENT:  Head: Normocephalic and atraumatic.  Graying hair pulled back.  Mask.  Eyes: Conjunctivae and EOM are normal. No scleral icterus.  Dark rimmed glasses.  Blue eyes.  Neurological: She is alert and oriented to person, place, and time.  Skin: She is not diaphoretic.  Psychiatric: She has a normal mood and affect. Her behavior is normal. Judgment and thought content normal.  Tearful at times.  Nursing note and vitals reviewed.   No visits with results within 3 Day(s) from this visit.  Latest known visit with results is:  Office Visit on 12/28/2018  Component Date Value Ref Range Status  . b2a2 transcript  12/28/2018 Comment  % Final   Comment: (NOTE)           <  0.0032 % (sensitivity limit of assay)   . b3a2 transcript 12/28/2018 Comment  % Final   Comment: (NOTE)           <0.0032 % (sensitivity limit of assay)   . E1A2 Transcript 12/28/2018 Comment  % Final   Comment: (NOTE)           <0.0032 % (sensitivity limit of assay)   . Interpretation (BCRAL): 12/28/2018 Comment   Final   Comment: (NOTE) NEGATIVE for the BCR-ABL1 e1a2 (p190), e13a2 (b2a2, p210) and e14a2 (b3a2, p210) fusion transcripts. These results do not rule out the presence of rare BCR-ABL1 transcripts not detected by this assay.   . Director Review (BCRAL): 12/28/2018 Comment   Final   Comment: (NOTE) Anjen Chenn, MD, PhD Director, Molecular Oncology [facility] [place], [place] [address] [phone]   . Background: 12/28/2018 Comment   Corrected   Comment: (NOTE) This assay can detect three different types of BCR-ABL1 fusion transcripts associated with CML, ALL, and AML: e13a2 (previously b2a2) and e14a2 (previously b3a2) (major breakpoint, p210), as well as e1a2 (minor breakpoint, p190). The e13a2 and e14a2 transcript values are titrated to the current International Scale (IS). The standardized baseline is 100% BCR-ABL1 (IS) and major molecular response (MMR) is equivalent to 0.1% BCR-ABL1 (IS) corresponding to a 3-log reduction. Results should be correlated with appropriate clinical and laboratory information as indicated.   . Methodology 12/28/2018 Comment   Corrected   Comment: (NOTE) Total RNA is isolated from the sample and subject to a real-time, reverse transcriptase polymerase chain reaction (RT-PCR). The PCR primers and probes are specific for BCR-ABL1 e13a2, e14a2 and e1a2 fusion transcripts. The ABL1 transcript is amplified as the control for cDNA quantity and quality. Serial dilutions of a validated positive control RNA with known t(9;22)  BCR-ABL1 are used as reference for quantification of BCR-ABL1 relative to ABL1. The numeric BCR-ABL1 level is reported as % BCR-ABL1/ABL1 and the detection sensitivity is 4.5 log below the standard baseline (<0.0032%). This test was developed and its performance characteristics determined by [facility]. It has not been cleared or approved by the Food and Drug Administration. Performed At: [facility] [address] [place], [place] [id number] Chenn Anjen MDPhD Ph:[phone] Performed At: [facility] [address] [place], [place] [id number] Chenn Anjen MDPhD Ph:[phone]   . Sodium 12/28/2018 137  135 - 145 mmol/L Final  . Potassium 12/28/2018 4.2  3.5 - 5.1 mmol/L Final  . Chloride 12/28/2018 100  98 - 111 mmol/L Final  . CO2 12/28/2018 26  22 - 32 mmol/L Final  . Glucose, Bld 12/28/2018 157* 70 - 99 mg/dL Final  . BUN 12/28/2018 18  8 - 23 mg/dL Final  . Creatinine, Ser 12/28/2018 1.01* 0.44 - 1.00 mg/dL Final  . Calcium 12/28/2018 9.7  8.9 - 10.3 mg/dL Final  . Total Protein 12/28/2018 7.4  6.5 - 8.1 g/dL Final  . Albumin 12/28/2018 3.8  3.5 - 5.0 g/dL Final  . AST 12/28/2018 25  15 - 41 U/L Final  . ALT 12/28/2018 21  0 - 44 U/L Final  . Alkaline Phosphatase 12/28/2018 82  38 - 126 U/L Final  . Total Bilirubin 12/28/2018 0.5  0.3 - 1.2 mg/dL Final  . GFR calc non Af Amer 12/28/2018 59* >60 mL/min Final  . GFR calc Af Amer 12/28/2018 >60  >60 mL/min Final  . Anion gap 12/28/2018 11  5 - 15 Final     Performed at [facility] [facility], [address] [place], [place] [address]  . WBC 12/28/2018 4.3  4.0 - 10.5 K/uL Final  . RBC 12/28/2018 4.49  3.87 - 5.11 MIL/uL Final  . Hemoglobin 12/28/2018 12.9  12.0 - 15.0 g/dL Final  . HCT 12/28/2018 38.0  36.0 - 46.0 % Final  . MCV 12/28/2018 84.6  80.0 - 100.0 fL Final  . MCH 12/28/2018 28.7  26.0 - 34.0 pg Final  . MCHC 12/28/2018 33.9  30.0 - 36.0 g/dL Final  . RDW 12/28/2018 14.1  11.5 - 15.5 % Final  . Platelets  12/28/2018 234  150 - 400 K/uL Final  . nRBC 12/28/2018 0.0  0.0 - 0.2 % Final  . Neutrophils Relative % 12/28/2018 56  % Final  . Neutro Abs 12/28/2018 2.4  1.7 - 7.7 K/uL Final  . Lymphocytes Relative 12/28/2018 30  % Final  . Lymphs Abs 12/28/2018 1.3  0.7 - 4.0 K/uL Final  . Monocytes Relative 12/28/2018 11  % Final  . Monocytes Absolute 12/28/2018 0.5  0.1 - 1.0 K/uL Final  . Eosinophils Relative 12/28/2018 3  % Final  . Eosinophils Absolute 12/28/2018 0.1  0.0 - 0.5 K/uL Final  . Basophils Relative 12/28/2018 0  % Final  . Basophils Absolute 12/28/2018 0.0  0.0 - 0.1 K/uL Final  . Immature Granulocytes 12/28/2018 0  % Final  . Abs Immature Granulocytes 12/28/2018 0.01  0.00 - 0.07 K/uL Final   Performed at [place] [facility], [address] [place], [place] [address]    Assessment:  Evaleen Janis is a 63 y.o. female with chronic myelogenous leukemia (CML- CP). She presented with a WBC of 144,000.  Bone marrow on 11/2009 revealed CML in chronic phase with no increase in blast (4%).  B3a2 transcript was 60% (p210).    She began treatment withTasigna (nilotinib) 300 mg po BID in 11/2009.  Dose fluctuated secondary to tolerance issues (weight gain, CHF).  BCR-ABL was followed:0 in 02/2011 and 0.186% in 07/2011 0.11% in 12/2011.  She was intubated in 05/2012 with the CCU for possible aspiration versus narcotic effects, cardiac arrest and septic shock. Nilotinib was held.  BCR-ABL was followed:  0.004% in 04/2012.  Nilotinib was restarted at 200 mg p.o. daily with dose escalation beginning 06/2012.  BCR-ABL was 0.55% in 08/2012.  She began Bosulif (bosutinib) 400 mg a day beginning 03/2013.  She denied any side effects.  BCR-ABL was followed: 0.012% in 06/2015, 0 in 09/2015, 0 in 02/2016, 0 in 04/2016, 0.125% in 08/2016, 0 in 12/2016, and < 0.0032% on 12/28/2018.  She was last on bosutinib in 08/2017.  CBC on 11/29/2018 included a hematocrit 32.4, hemoglobin 10.8, platelets  236,000, white count 7600 with an ANC of 5700.  She has persistent cellulitis of the right foot.  Initially her cellulitis had extended proximally.  She has been on 4 different courses of antibiotic; this will be her 5th course.   Symptomatically, she doesn't feel good.  She notes issues with heart failure.  Plan: 1.   Chronic myelogenous leukemia (CML).             Review labs from last visit.  Hematocrit 38.0.  Hemoglobin 12.9.  MCV 84.6.  Platelets 234,000, WBC 4300 with an ANC of 2400.  BCR-ABLR was < 0.0032%.              She has not been on bosutinib since 08/2017.               Discuss current plan for observation off treatment.  [facility] contacted- concurred.  Discuss plan to reinstitute bosutinib if BCR-ABL transcript recurs.  Patient to contact clinic if any interval concerns.  Continue observation off therapy with close monitoring every 3 months. 2.  RTC on 03/30/2019 for MD assessment and labs (CBC with diff, CMP, BCR-ABL).    I discussed the assessment and treatment plan with the patient.  The patient was provided an opportunity to ask questions and all were answered.  The patient agreed with the plan and demonstrated an understanding of the instructions.  The patient was advised to call back if the symptoms worsen or if the condition fails to improve as anticipated.   Melissa C Corcoran, MD, PhD    01/13/2019, 1:19 PM  I, Alexis Patterson, am acting as scribe for Melissa C. Corcoran, MD, PhD.  I, Melissa C. Corcoran, MD, have reviewed the above documentation for accuracy and completeness, and I agree with the above. 

## 2019-01-12 ENCOUNTER — Other Ambulatory Visit: Payer: Self-pay

## 2019-01-12 ENCOUNTER — Ambulatory Visit: Payer: Self-pay | Admitting: Hematology and Oncology

## 2019-01-12 ENCOUNTER — Encounter: Payer: Self-pay | Admitting: Hematology and Oncology

## 2019-01-13 ENCOUNTER — Encounter: Payer: Self-pay | Admitting: Hematology and Oncology

## 2019-01-13 ENCOUNTER — Inpatient Hospital Stay (HOSPITAL_BASED_OUTPATIENT_CLINIC_OR_DEPARTMENT_OTHER): Payer: Self-pay | Admitting: Hematology and Oncology

## 2019-01-13 VITALS — BP 144/74 | HR 78 | Temp 97.7°F | Resp 18 | Ht 67.0 in | Wt 284.5 lb

## 2019-01-13 DIAGNOSIS — C9210 Chronic myeloid leukemia, BCR/ABL-positive, not having achieved remission: Secondary | ICD-10-CM

## 2019-01-13 NOTE — Progress Notes (Signed)
The patient c/o having hot flashes

## 2019-01-28 ENCOUNTER — Other Ambulatory Visit: Payer: Self-pay | Admitting: Psychiatry

## 2019-02-02 ENCOUNTER — Encounter: Payer: Self-pay | Admitting: Psychiatry

## 2019-02-02 ENCOUNTER — Ambulatory Visit (INDEPENDENT_AMBULATORY_CARE_PROVIDER_SITE_OTHER): Payer: Self-pay | Admitting: Psychiatry

## 2019-02-02 ENCOUNTER — Other Ambulatory Visit: Payer: Self-pay

## 2019-02-02 DIAGNOSIS — F3130 Bipolar disorder, current episode depressed, mild or moderate severity, unspecified: Secondary | ICD-10-CM

## 2019-02-02 DIAGNOSIS — F4310 Post-traumatic stress disorder, unspecified: Secondary | ICD-10-CM

## 2019-02-02 MED ORDER — TRAZODONE HCL 150 MG PO TABS: 150.0000 mg | ORAL_TABLET | Freq: Every day | ORAL | 5 refills | Status: DC

## 2019-02-02 MED ORDER — DULOXETINE HCL 20 MG PO CPEP: 20.0000 mg | ORAL_CAPSULE | Freq: Three times a day (TID) | ORAL | 5 refills | Status: DC

## 2019-02-02 MED ORDER — ALPRAZOLAM 1 MG PO TABS: 1.0000 mg | ORAL_TABLET | Freq: Three times a day (TID) | ORAL | 5 refills | Status: DC

## 2019-02-02 MED ORDER — PREGABALIN 300 MG PO CAPS: 300.0000 mg | ORAL_CAPSULE | Freq: Two times a day (BID) | ORAL | 5 refills | Status: DC

## 2019-02-02 NOTE — Progress Notes (Signed)
This is a follow-up for this patient with chronic anxiety and depression.  Reached her on the telephone.  Identified patient and myself.  Patient was appropriate and forthcoming.  She reports that life remains stressful.  Her husband's lingering chronic illness continues to require her attention almost all the time.  Patient however says she thinks she is holding up okay.  Has not gotten into a place of suicidal thinking.  Still able to have some enjoyment herself.  Sleep is adequate.  Chronic pain is okay in control.  Patient is alert and oriented appropriate in her interaction.  Affect was appropriately reactive.  Thoughts lucid.  No sign of disorganized thinking.  Denies suicidal thoughts.  Reviewed medication with patient.  She continues to be on multiple medications including alprazolam duloxetine pregabalin trazodone.  Patient wants me to send her Neurontin prescription to the [facility] but the rest of them to [facility].  I will put in 52-month prescriptions and we can follow-up 3 to 6 months as she needs it.

## 2019-03-29 ENCOUNTER — Encounter: Payer: Self-pay | Admitting: Hematology and Oncology

## 2019-03-29 ENCOUNTER — Other Ambulatory Visit: Payer: Self-pay

## 2019-03-29 NOTE — Progress Notes (Signed)
The patient c/o lower back pain ( 5) and will increase with movement. The patient name and DOB has been verified by phone today.

## 2019-03-31 ENCOUNTER — Inpatient Hospital Stay: Payer: Self-pay

## 2019-03-31 ENCOUNTER — Encounter: Payer: Self-pay | Admitting: Hematology and Oncology

## 2019-03-31 ENCOUNTER — Inpatient Hospital Stay: Payer: Self-pay | Attending: Hematology and Oncology | Admitting: Hematology and Oncology

## 2019-05-04 NOTE — Progress Notes (Signed)
This encounter was created in error - please disregard.

## 2019-06-22 ENCOUNTER — Other Ambulatory Visit: Payer: Self-pay

## 2019-06-22 ENCOUNTER — Emergency Department: Payer: Self-pay

## 2019-06-22 ENCOUNTER — Inpatient Hospital Stay
Admission: EM | Admit: 2019-06-22 | Discharge: 2019-06-25 | DRG: 563 | Disposition: A | Discharge status: Home Health | Payer: Self-pay | Attending: Internal Medicine | Admitting: Internal Medicine

## 2019-06-22 DIAGNOSIS — Z856 Personal history of leukemia: Secondary | ICD-10-CM

## 2019-06-22 DIAGNOSIS — Z886 Allergy status to analgesic agent status: Secondary | ICD-10-CM

## 2019-06-22 DIAGNOSIS — R52 Pain, unspecified: Secondary | ICD-10-CM | POA: Diagnosis present

## 2019-06-22 DIAGNOSIS — S82012A Displaced osteochondral fracture of left patella, initial encounter for closed fracture: Principal | ICD-10-CM

## 2019-06-22 DIAGNOSIS — W19XXXA Unspecified fall, initial encounter: Secondary | ICD-10-CM

## 2019-06-22 DIAGNOSIS — Z794 Long term (current) use of insulin: Secondary | ICD-10-CM

## 2019-06-22 DIAGNOSIS — Z8249 Family history of ischemic heart disease and other diseases of the circulatory system: Secondary | ICD-10-CM

## 2019-06-22 DIAGNOSIS — F418 Other specified anxiety disorders: Secondary | ICD-10-CM | POA: Diagnosis present

## 2019-06-22 DIAGNOSIS — J449 Chronic obstructive pulmonary disease, unspecified: Secondary | ICD-10-CM | POA: Diagnosis present

## 2019-06-22 DIAGNOSIS — S83512A Sprain of anterior cruciate ligament of left knee, initial encounter: Secondary | ICD-10-CM | POA: Diagnosis present

## 2019-06-22 DIAGNOSIS — Z91040 Latex allergy status: Secondary | ICD-10-CM

## 2019-06-22 DIAGNOSIS — I952 Hypotension due to drugs: Secondary | ICD-10-CM | POA: Diagnosis not present

## 2019-06-22 DIAGNOSIS — W010XXA Fall on same level from slipping, tripping and stumbling without subsequent striking against object, initial encounter: Secondary | ICD-10-CM | POA: Diagnosis present

## 2019-06-22 DIAGNOSIS — M25562 Pain in left knee: Secondary | ICD-10-CM

## 2019-06-22 DIAGNOSIS — Y92007 Garden or yard of unspecified non-institutional (private) residence as the place of occurrence of the external cause: Secondary | ICD-10-CM

## 2019-06-22 DIAGNOSIS — Z79899 Other long term (current) drug therapy: Secondary | ICD-10-CM

## 2019-06-22 DIAGNOSIS — Z20822 Contact with and (suspected) exposure to covid-19: Secondary | ICD-10-CM | POA: Diagnosis present

## 2019-06-22 DIAGNOSIS — S83242A Other tear of medial meniscus, current injury, left knee, initial encounter: Secondary | ICD-10-CM | POA: Diagnosis present

## 2019-06-22 DIAGNOSIS — Z833 Family history of diabetes mellitus: Secondary | ICD-10-CM

## 2019-06-22 DIAGNOSIS — S82012D Displaced osteochondral fracture of left patella, subsequent encounter for closed fracture with routine healing: Secondary | ICD-10-CM

## 2019-06-22 DIAGNOSIS — Z6841 Body Mass Index (BMI) 40.0 and over, adult: Secondary | ICD-10-CM

## 2019-06-22 DIAGNOSIS — I10 Essential (primary) hypertension: Secondary | ICD-10-CM | POA: Diagnosis present

## 2019-06-22 DIAGNOSIS — E1165 Type 2 diabetes mellitus with hyperglycemia: Secondary | ICD-10-CM | POA: Diagnosis present

## 2019-06-22 DIAGNOSIS — F419 Anxiety disorder, unspecified: Secondary | ICD-10-CM | POA: Diagnosis present

## 2019-06-22 DIAGNOSIS — I110 Hypertensive heart disease with heart failure: Secondary | ICD-10-CM | POA: Diagnosis present

## 2019-06-22 DIAGNOSIS — I5032 Chronic diastolic (congestive) heart failure: Secondary | ICD-10-CM | POA: Diagnosis present

## 2019-06-22 DIAGNOSIS — Z885 Allergy status to narcotic agent status: Secondary | ICD-10-CM

## 2019-06-22 DIAGNOSIS — R400 Somnolence: Secondary | ICD-10-CM | POA: Diagnosis present

## 2019-06-22 DIAGNOSIS — Y92239 Unspecified place in hospital as the place of occurrence of the external cause: Secondary | ICD-10-CM | POA: Diagnosis present

## 2019-06-22 DIAGNOSIS — G4733 Obstructive sleep apnea (adult) (pediatric): Secondary | ICD-10-CM | POA: Diagnosis present

## 2019-06-22 DIAGNOSIS — E119 Type 2 diabetes mellitus without complications: Secondary | ICD-10-CM

## 2019-06-22 DIAGNOSIS — T402X5A Adverse effect of other opioids, initial encounter: Secondary | ICD-10-CM | POA: Diagnosis not present

## 2019-06-22 LAB — CBC WITH DIFFERENTIAL/PLATELET
Abs Immature Granulocytes: 0.14 10*3/uL — ABNORMAL HIGH (ref 0.00–0.07)
Basophils Absolute: 0.1 10*3/uL (ref 0.0–0.1)
Basophils Relative: 1 %
Eosinophils Absolute: 0.1 10*3/uL (ref 0.0–0.5)
Eosinophils Relative: 2 %
HCT: 36.8 % (ref 36.0–46.0)
Hemoglobin: 12.5 g/dL (ref 12.0–15.0)
Immature Granulocytes: 3 %
Lymphocytes Relative: 17 %
Lymphs Abs: 0.9 10*3/uL (ref 0.7–4.0)
MCH: 28.7 pg (ref 26.0–34.0)
MCHC: 34 g/dL (ref 30.0–36.0)
MCV: 84.6 fL (ref 80.0–100.0)
Monocytes Absolute: 0.3 10*3/uL (ref 0.1–1.0)
Monocytes Relative: 6 %
Neutro Abs: 3.8 10*3/uL (ref 1.7–7.7)
Neutrophils Relative %: 71 %
Platelets: 242 10*3/uL (ref 150–400)
RBC: 4.35 MIL/uL (ref 3.87–5.11)
RDW: 12.4 % (ref 11.5–15.5)
WBC: 5.3 10*3/uL (ref 4.0–10.5)
nRBC: 0 % (ref 0.0–0.2)

## 2019-06-22 LAB — COMPREHENSIVE METABOLIC PANEL
ALT: 18 U/L (ref 0–44)
AST: 18 U/L (ref 15–41)
Albumin: 3.2 g/dL — ABNORMAL LOW (ref 3.5–5.0)
Alkaline Phosphatase: 64 U/L (ref 38–126)
Anion gap: 10 (ref 5–15)
BUN: 22 mg/dL (ref 8–23)
CO2: 26 mmol/L (ref 22–32)
Calcium: 9 mg/dL (ref 8.9–10.3)
Chloride: 99 mmol/L (ref 98–111)
Creatinine, Ser: 0.74 mg/dL (ref 0.44–1.00)
GFR calc Af Amer: >60 mL/min (ref >60)
GFR calc non Af Amer: >60 mL/min (ref >60)
Glucose, Bld: 368 mg/dL — ABNORMAL HIGH (ref 70–99)
Potassium: 4.4 mmol/L (ref 3.5–5.1)
Sodium: 135 mmol/L (ref 135–145)
Total Bilirubin: 0.7 mg/dL (ref 0.3–1.2)
Total Protein: 6.2 g/dL — ABNORMAL LOW (ref 6.5–8.1)

## 2019-06-22 LAB — GLUCOSE, CAPILLARY: Glucose-Capillary: 335 mg/dL — ABNORMAL HIGH (ref 70–99)

## 2019-06-22 MED ORDER — ONDANSETRON HCL 4 MG PO TABS: 4.0000 mg | ORAL_TABLET | Freq: Four times a day (QID) | ORAL | Status: DC | PRN

## 2019-06-22 MED ORDER — HYDROMORPHONE HCL 1 MG/ML IJ SOLN
0.5000 mg | INTRAMUSCULAR | Status: DC | PRN
  Administered 2019-06-23 – 2019-06-24 (×7): 1 mg via INTRAVENOUS
  Filled 2019-06-22 (×7): qty 1

## 2019-06-22 MED ORDER — HYDROMORPHONE HCL 1 MG/ML IJ SOLN
0.5000 mg | Freq: Once | INTRAMUSCULAR | Status: AC
  Administered 2019-06-22: 20:00:00 0.5 mg via INTRAVENOUS
  Filled 2019-06-22: qty 1

## 2019-06-22 MED ORDER — ACETAMINOPHEN 650 MG RE SUPP: 650.0000 mg | Freq: Four times a day (QID) | RECTAL | Status: DC | PRN

## 2019-06-22 MED ORDER — ONDANSETRON HCL 4 MG/2ML IJ SOLN
4.0000 mg | Freq: Once | INTRAMUSCULAR | Status: AC
  Administered 2019-06-22: 4 mg via INTRAVENOUS
  Filled 2019-06-22: qty 2

## 2019-06-22 MED ORDER — INSULIN ASPART 100 UNIT/ML ~~LOC~~ SOLN
0.0000 [IU] | Freq: Every day | SUBCUTANEOUS | Status: DC
  Administered 2019-06-23: 01:00:00 5 [IU] via SUBCUTANEOUS
  Administered 2019-06-24: 4 [IU] via SUBCUTANEOUS
  Filled 2019-06-22 (×2): qty 1

## 2019-06-22 MED ORDER — INSULIN ASPART 100 UNIT/ML ~~LOC~~ SOLN
0.0000 [IU] | Freq: Three times a day (TID) | SUBCUTANEOUS | Status: DC
  Administered 2019-06-23: 20 [IU] via SUBCUTANEOUS
  Administered 2019-06-23: 11 [IU] via SUBCUTANEOUS
  Administered 2019-06-23: 15 [IU] via SUBCUTANEOUS
  Administered 2019-06-24: 12 [IU] via SUBCUTANEOUS
  Administered 2019-06-24: 3 [IU] via SUBCUTANEOUS
  Administered 2019-06-24: 20 [IU] via SUBCUTANEOUS
  Administered 2019-06-25: 15 [IU] via SUBCUTANEOUS
  Administered 2019-06-25: 11 [IU] via SUBCUTANEOUS
  Filled 2019-06-22 (×8): qty 1

## 2019-06-22 MED ORDER — ONDANSETRON HCL 4 MG/2ML IJ SOLN: 4.0000 mg | Freq: Four times a day (QID) | INTRAMUSCULAR | Status: DC | PRN

## 2019-06-22 MED ORDER — OXYCODONE HCL 5 MG PO TABS
5.0000 mg | ORAL_TABLET | ORAL | Status: DC | PRN
  Administered 2019-06-23 – 2019-06-25 (×5): 5 mg via ORAL
  Filled 2019-06-22 (×5): qty 1

## 2019-06-22 MED ORDER — ACETAMINOPHEN 325 MG PO TABS
650.0000 mg | ORAL_TABLET | Freq: Four times a day (QID) | ORAL | Status: DC | PRN
  Administered 2019-06-24 (×2): 650 mg via ORAL
  Filled 2019-06-22 (×2): qty 2

## 2019-06-22 MED ORDER — HYDROMORPHONE HCL 1 MG/ML IJ SOLN
0.5000 mg | Freq: Once | INTRAMUSCULAR | Status: AC
  Administered 2019-06-22: 0.5 mg via INTRAVENOUS
  Filled 2019-06-22: qty 1

## 2019-06-22 MED ORDER — FENTANYL CITRATE (PF) 100 MCG/2ML IJ SOLN
50.0000 ug | Freq: Once | INTRAMUSCULAR | Status: AC
  Administered 2019-06-22: 50 ug via INTRAVENOUS
  Filled 2019-06-22: qty 2

## 2019-06-22 NOTE — ED Triage Notes (Addendum)
Pt to ED via EMS post mechanical fall. Pt was walking outside tripped and landed directly onto left knee, pt arrives with significant amount of swelling and large deformity to left knee. Pt given 169mcg fentanyl in route to ED and denies any change in pain. Pulses present in affected extremity

## 2019-06-22 NOTE — ED Provider Notes (Signed)
Emergency Department Provider Note  ____________________________________________  Time seen: Approximately 8:17 PM  I have reviewed the triage vital signs and the nursing notes.   HISTORY  Chief Complaint Fall   Historian Patient    HPI Tara Levine is a 64 y.o. female presents to the emergency department with left knee deformity after patient states that she tripped outside and landed on her left knee.  She denies hitting her head or her neck.  She has noticed a large effusion around left knee since injury occurred.  She denies chest pain, chest tightness or abdominal pain.  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   Past Medical History:  Diagnosis Date  . CML (chronic myelocytic leukemia) ([place])   . Depression   . DM type 2 (diabetes mellitus, type 2) ([place])   . Environmental allergies   . History of gastric ulcer   . HLD (hyperlipidemia)   . IDA (iron deficiency anemia)    chronic  . Leukemia ([place])   . Leukemia, chronic myeloid ([place])   . Osteoarthritis   . Panic disorder   . PSS (progressive systemic sclerosis) ([place])   . Unspecified essential hypertension      Immunizations up to date:  Yes.     Past Medical History:  Diagnosis Date  . CML (chronic myelocytic leukemia) ([place])   . Depression   . DM type 2 (diabetes mellitus, type 2) ([place])   . Environmental allergies   . History of gastric ulcer   . HLD (hyperlipidemia)   . IDA (iron deficiency anemia)    chronic  . Leukemia ([place])   . Leukemia, chronic myeloid ([place])   . Osteoarthritis   . Panic disorder   . PSS (progressive systemic sclerosis) ([place])   . Unspecified essential hypertension     Patient Active Problem List   Diagnosis Date Noted  . Accidental fall 06/22/2019  . Closed displaced osteochondral fracture of left patella 06/22/2019  . Obesity, Class III, BMI 40-49.9 (morbid obesity) ([place]) 06/22/2019  . Type 2 diabetes mellitus without complication  ([place]) [id number]  . COPD with chronic bronchitis ([place]) 06/22/2019  . Anxiety 06/22/2019  . Left anterior knee pain 06/22/2019  . Intractable pain 06/22/2019  . Risk for falls 06/20/2017  . Acute bacterial sinusitis 04/16/2017  . Major depression, recurrent, chronic ([place]) 08/03/2015  . Bilateral pneumonia 09/07/2014  . OSA (obstructive sleep apnea) 09/07/2014  . Obesity hypoventilation syndrome ([place]) 09/07/2014  . Hyponatremia 09/07/2014  . Acute on chronic renal failure ([place]) 09/07/2014  . Morbid obesity ([place]) 09/07/2014  . Hyperkalemia 09/07/2014  . Spinal stenosis, 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  . Chronic back pain 09/07/2014  . Diaper candidiasis 09/07/2014  . Constipation 09/07/2014  . Acute respiratory failure with hypoxia ([place]) 08/31/2014  . Chronic diastolic CHF (congestive heart failure) ([place]) 05/18/2014  . Acute bronchitis 04/05/2014  . Severe obesity (BMI >= 40) ([place]) 02/04/2014  . Cough 01/31/2014  . Snoring 01/31/2014  . SOB (shortness of breath) 01/31/2014  . Post-nasal drip 01/31/2014  . Clinical depression 11/19/2013  . Blood in sputum 10/19/2013  . Cephalalgia 09/24/2012  . Diastolic heart failure ([place]) 09/08/2012  . Essential (primary) hypertension 09/08/2012  . Arthritis, degenerative 09/08/2012  . Diverticulitis 07/31/2012  . Fibromyalgia 07/31/2012  . PNA (pneumonia) 07/30/2012  . Chronic generalized pain 06/26/2012  . Diabetes mellitus ([place]) 06/26/2012  . Gastric catarrh 06/26/2012  . BP (high blood pressure) 06/26/2012  . Cellulitis and abscess of trunk  03/26/2012  . Cervical pain 02/17/2012  . LBP (low back pain) 02/17/2012  . Hypercholesterolemia 05/27/2011  . Abnormal EKG 04/02/2011  . Chest tightness 04/02/2011  . Chronic pain 04/02/2011  . HTN (hypertension) 04/02/2011  . Anxiety and depression 04/02/2011  . Back ache 01/15/2011  . Chronic myeloid leukemia ([place]) 01/03/2011    Past Surgical History:  Procedure Laterality Date  . back and neck  surgery  1999, 2009   had rods placed  . [facility]) No blackages found.   . CARPAL TUNNEL RELEASE    . PARTIAL HYSTERECTOMY     age 28, no cancer    Prior to Admission medications   Medication Sig Start Date End Date Taking? Authorizing Provider  ACCU-CHEK GUIDE test strip TEST ONCE DAILY AND AS DIRECTED. 06/27/17   [provider]  ACCU-CHEK SOFTCLIX LANCETS lancets Use 1 each 3 (three) times daily. Use as instructed. 09/11/11   [provider]  albuterol (PROVENTIL HFA;VENTOLIN HFA) 108 (90 BASE) MCG/ACT inhaler Inhale 2 puffs into the lungs every 6 (six) hours as needed for wheezing or shortness of breath.    [provider]  albuterol (PROVENTIL) (2.5 MG/3ML) 0.083% nebulizer solution INHALE CONTENTS OF 1 VIAL VIA NEBULIZER THREE TIMES A DAY prn 03/08/15   [provider]  ALPRAZolam Duanne [place]) 1 MG tablet Take 1 tablet (1 mg total) by mouth 3 (three) times daily. 02/02/19   Clapacs, Madie Reno, MD  Ascorbic Acid (VITAMIN C) 1000 MG tablet Take 3,000 mg by mouth daily.    [provider]  Blood Glucose Monitoring Suppl (GLUCOCOM BLOOD GLUCOSE MONITOR) DEVI Use as directed. 09/11/11   [provider]  bosutinib (BOSULIF) 100 MG tablet Take 4 tablets (400 mg total) by mouth daily. Patient not taking: Reported on 12/28/2018 12/17/16   Lloyd Huger, MD  celecoxib (CELEBREX) 200 MG capsule TAKE 1 CAPSULE BY MOUTH TWO TIMES A DAY Patient not taking: Reported on 01/12/2019 03/25/17   Clapacs, Madie Reno, MD  cephALEXin (KEFLEX) 500 MG capsule Take 1 capsule (500 mg total) by mouth 4 (four) times daily. Patient not taking: Reported on 12/28/2018 11/29/18   Cuthriell, Charline Bills, PA-C  cetirizine (ZYRTEC) 10 MG tablet Take 20 mg by mouth daily.     [provider]  Cholecalciferol (VITAMIN D PO) Take 1 tablet by mouth daily.    [provider]  clindamycin (CLEOCIN) 300 MG capsule Take 1  capsule (300 mg total) by mouth 4 (four) times daily. Patient not taking: Reported on 12/28/2018 11/29/18   Cuthriell, Charline Bills, PA-C  co-enzyme Q-10 50 MG capsule Take 50 mg by mouth daily.    [provider]  diphenhydrAMINE (BENADRYL) 25 MG tablet Take 25 mg by mouth every 6 (six) hours as needed for itching or allergies.     [provider]  doxycycline (VIBRA-TABS) 100 MG tablet Take 1 tablet (100 mg total) by mouth 2 (two) times daily. Patient not taking: Reported on 12/28/2018 04/05/18   Norval Gable, MD  DULoxetine (CYMBALTA) 20 MG capsule Take 1 capsule (20 mg total) by mouth 3 (three) times daily. 02/02/19   Clapacs, Madie Reno, MD  fluconazole (DIFLUCAN) 150 MG tablet Take 1 tablet (150 mg total) by mouth daily. Patient not taking: Reported on 12/28/2018 04/05/18   Norval Gable, MD  fluticasone [facility]) 50 MCG/ACT nasal spray Place 1-2 sprays into both nostrils as needed for rhinitis.     [provider]  furosemide (LASIX) 40 MG tablet Take 1 tablet (40 mg total) by mouth daily. 01/23/15   Cammie Sickle, MD  HYDROcodone-acetaminophen (NORCO/VICODIN) 5-325 MG tablet Take 1 tablet by mouth every 4 (four) hours as needed for moderate pain. Patient not taking: Reported on 12/28/2018 11/29/18   Cuthriell, Charline Bills, PA-C  insulin aspart (NOVOLOG) 100 UNIT/ML injection Inject 15 Units into the skin 3 (three) times daily as needed for high blood sugar.     [provider]  insulin glargine (LANTUS) 100 UNIT/ML injection Inject 0.37 mLs (37 Units total) into the skin daily. 09/07/14   Theodoro Grist, MD  losartan (COZAAR) 100 MG tablet Take 100 mg by mouth daily.    [provider]  montelukast (SINGULAIR) 10 MG tablet Take 1 tablet (10 mg total) by mouth at bedtime. 10/13/18   Clapacs, Madie Reno, MD  Olopatadine HCl (PATADAY) 0.2 % SOLN Place 1 drop into both eyes daily. 02/15/15   [provider]  Omega-3 Fatty Acids (OMEGA 3 PO) Take 3  capsules by mouth daily.    [provider]  omeprazole (PRILOSEC) 40 MG capsule Take by mouth. Reported on 04/21/2015    [provider]  ondansetron (ZOFRAN ODT) 8 MG disintegrating tablet Take 1 tablet (8 mg total) by mouth every 8 (eight) hours as needed. Patient not taking: Reported on 12/28/2018 04/05/18   Norval Gable, MD  oseltamivir (TAMIFLU) 75 MG capsule Take 1 capsule (75 mg total) by mouth 2 (two) times daily. Patient not taking: Reported on 12/28/2018 04/05/18   Norval Gable, MD  predniSONE (DELTASONE) 20 MG tablet Take 1 tablet (20 mg total) by mouth daily. Patient not taking: Reported on 12/28/2018 04/05/18   Norval Gable, MD  pregabalin (LYRICA) 300 MG capsule TAKE 1 CAPSULE BY MOUTH TWICE A DAY 02/05/19   Clapacs, Madie Reno, MD  pregabalin (LYRICA) 300 MG capsule Take 1 capsule (300 mg total) by mouth 2 (two) times daily. 02/02/19   Clapacs, Madie Reno, MD  spironolactone (ALDACTONE) 25 MG tablet Take 1 tablet (25 mg total) by mouth 2 (two) times daily. Patient not taking: Reported on 12/28/2018 05/31/16   Lloyd Huger, MD  traZODone (DESYREL) 150 MG tablet Take 1 tablet (150 mg total) by mouth at bedtime. 02/02/19   Clapacs, Madie Reno, MD  vitamin B-12 (CYANOCOBALAMIN) 1000 MCG tablet Take 4,000 mcg by mouth daily.    [provider]    Allergies Morphine, Codeine, Latex, Morphine and related, Relafen [nabumetone], Nsaids, and Tape  Family History  Problem Relation Age of Onset  . Diabetes Mother   . Hypertension Mother   . Heart disease Mother   . Prostate cancer Father   . Heart disease Father   . Diabetes Brother     Social History Social History   Tobacco Use  . Smoking status: Never Smoker  . Smokeless tobacco: Never Used  . Tobacco comment: quit 1985  Substance Use Topics  . Alcohol use: No    Alcohol/week: 0.0 standard drinks  . Drug use: No     Review of Systems  Constitutional: No fever/chills Eyes:  No discharge ENT: No upper  respiratory complaints. Respiratory: no cough. No SOB/ use of accessory muscles to breath Gastrointestinal:   No nausea, no vomiting.  No diarrhea.  No constipation. Musculoskeletal: Patient has left knee deformity.  Skin: Negative for rash, abrasions, lacerations, ecchymosis.   ____________________________________________   PHYSICAL EXAM:  VITAL SIGNS: ED Triage Vitals  Enc  Vitals Group     BP 06/22/19 1911 (!) 215/120     Pulse Rate 06/22/19 1911 93     Resp 06/22/19 1911 20     Temp --      Temp src --      SpO2 06/22/19 1911 94 %     Weight 06/22/19 1908 280 lb (127 kg)     Height 06/22/19 1908 5\' 7"  (1.702 m)     Head Circumference --      Peak Flow --      Pain Score 06/22/19 1908 10     Pain Loc --      Pain Edu? --      Excl. in [place]? --      Constitutional: Alert and oriented. Well appearing and in no acute distress. Eyes: Conjunctivae are normal. PERRL. EOMI. Head: Atraumatic. Cardiovascular: Normal rate, regular rhythm. Normal S1 and S2.  Good peripheral circulation. Respiratory: Normal respiratory effort without tachypnea or retractions. Lungs CTAB. Good air entry to the bases with no decreased or absent breath sounds Gastrointestinal: Bowel sounds x 4 quadrants. Soft and nontender to palpation. No guarding or rigidity. No distention. Musculoskeletal: Patient unable to perform full range of motion at the left knee due to deformity.  Patient has large left knee hematoma.  Palpable dorsalis pedis pulse, left. Neurologic:  Normal for age. No gross focal neurologic deficits are appreciated.  Skin:  Skin is warm, dry and intact. No rash noted. Psychiatric: Mood and affect are normal for age. Speech and behavior are normal.   ____________________________________________   LABS (all labs ordered are listed, but only abnormal results are displayed)  Labs Reviewed  CBC WITH DIFFERENTIAL/PLATELET - Abnormal; Notable for the following components:      Result Value    Abs Immature Granulocytes 0.14 (*)    All other components within normal limits  COMPREHENSIVE METABOLIC PANEL - Abnormal; Notable for the following components:   Glucose, Bld 368 (*)    Total Protein 6.2 (*)    Albumin 3.2 (*)    All other components within normal limits  GLUCOSE, CAPILLARY - Abnormal; Notable for the following components:   Glucose-Capillary 335 (*)    All other components within normal limits  HIV ANTIBODY (ROUTINE TESTING W REFLEX)  HEMOGLOBIN A1C  CBC   ____________________________________________  EKG   ____________________________________________  RADIOLOGY Unk Pinto, personally viewed and evaluated these images (plain radiographs) as part of my medical decision making, as well as reviewing the written report by the radiologist.  MR KNEE LEFT WO CONTRAST  Result Date: 06/22/2019 CLINICAL DATA:  Knee instability, tripped and fell directly on knee EXAM: MRI OF THE LEFT KNEE WITHOUT CONTRAST TECHNIQUE: Multiplanar, multisequence MR imaging of the knee was performed. No intravenous contrast was administered. COMPARISON:  June 22, 2019 FINDINGS: MENISCI Medial: There is a nondisplaced horizontal longitudinal tear seen of the posterior horn of the medial meniscus extending to the mid body. Lateral: Intact. LIGAMENTS Cruciates: There is a tiny interstitial tear seen at the anterior ACL at the femoral insertion site. However there is intact fibers seen throughout. The PCL is intact. Collaterals: The MCL is intact. The lateral collateral ligamentous complex is intact. CARTILAGE Patellofemoral: As described below there is an osteochondral injury at the medial patellar facet. Medial compartment: Mild chondral fissuring seen the weight-bearing surface of the medial femoral condyle. Lateral compartment: Mild chondral fissuring seen the weight-bearing surface of lateral femoral condyle. BONES: There is an focal osteochondral fracture  seen at the inferior medial patellar  facet measuring 1.2 cm in length. No definite displaced osteochondral fragment however is seen. JOINT: There is a small knee joint effusion with scattered debris. EXTENSOR MECHANISM: The patellar and quadriceps tendon are intact. The retinaculum is unremarkable. POPLITEAL FOSSA: No popliteal cyst. OTHER: There is a large heterogeneous multi lobular 10.6 x 3.1 cm soft tissue hematoma seen overlying the anteromedial prepatellar knee which extends to the proximal tibia. There is extensive overlying subcutaneous edema seen. IMPRESSION: 1. 1.2 cm osteochondral fracture seen at the inferior medial patellar facet without a displaced osteochondral fragment. 2. Large heterogeneous anteromedial prepatellar soft tissue hematoma with extensive subcutaneous edema. 3. Nondisplaced posterior medial meniscal tear. 4. Interstitial anterior ACL tear, however there are intact fibers seen throughout. 5. Small knee joint effusion. Electronically Signed   By: Prudencio Pair M.D.   On: 06/22/2019 23:22   DG Knee Complete 4 Views Left  Result Date: 06/22/2019 CLINICAL DATA:  Pain following fall EXAM: LEFT KNEE - COMPLETE 4+ VIEW COMPARISON:  None. FINDINGS: Frontal, lateral, and bilateral oblique views were obtained. There is no evident acute fracture or dislocation. There is marked soft tissue swelling in the prepatellar region with probable soft tissue hematoma in this area. There is a small joint effusion. No joint space narrowing or erosion. IMPRESSION: Marked soft tissue swelling in the prepatellar region with probable hematoma throughout this area. Small joint effusion evident. No fracture or dislocation. No joint space narrowing or erosion. Electronically Signed   By: Lowella Grip III M.D.   On: 06/22/2019 19:46   DG Hip Unilat W or Wo Pelvis 2-3 Views Left  Result Date: 06/22/2019 CLINICAL DATA:  Pain following fall EXAM: DG HIP (WITH OR WITHOUT PELVIS) 2-3V LEFT COMPARISON:  Left hip radiographs January 21, 2013  FINDINGS: Frontal pelvis as well as frontal and lateral left hip images were obtained. No fracture or dislocation. Joint spaces appear unremarkable. No erosive change. There is postoperative change in the visualized lower lumbar spine. IMPRESSION: No fracture or dislocation. No appreciable arthropathic change. Postoperative change in visualized lumbar spine. Electronically Signed   By: Lowella Grip III M.D.   On: 06/22/2019 19:47    ____________________________________________    PROCEDURES  Procedure(s) performed:     Procedures     Medications  acetaminophen (TYLENOL) tablet 650 mg (has no administration in time range)    Or  acetaminophen (TYLENOL) suppository 650 mg (has no administration in time range)  oxyCODONE (Oxy IR/ROXICODONE) immediate release tablet 5 mg (has no administration in time range)  HYDROmorphone (DILAUDID) injection 0.5-1 mg (has no administration in time range)  ondansetron (ZOFRAN) tablet 4 mg (has no administration in time range)    Or  ondansetron (ZOFRAN) injection 4 mg (has no administration in time range)  insulin aspart (novoLOG) injection 0-20 Units (has no administration in time range)  insulin aspart (novoLOG) injection 0-5 Units (has no administration in time range)  fentaNYL (SUBLIMAZE) injection 50 mcg (50 mcg Intravenous Given 06/22/19 1915)  ondansetron (ZOFRAN) injection 4 mg (4 mg Intravenous Given 06/22/19 1915)  HYDROmorphone (DILAUDID) injection 0.5 mg (0.5 mg Intravenous Given 06/22/19 2023)  HYDROmorphone (DILAUDID) injection 0.5 mg (0.5 mg Intravenous Given 06/22/19 2149)     ____________________________________________   INITIAL IMPRESSION / ASSESSMENT AND PLAN / ED COURSE  Pertinent labs & imaging results that were available during my care of the patient were reviewed by me and considered in my medical decision making (see chart for details).  Assessment and plan Knee pain:  64 year old female presents to the  emergency department after a mechanical fall.  Patient was notably hypertensive on exam with a large left knee hematoma.  X-ray examination of the left knee revealed no bony abnormality.  Will obtain MRI and will administer fentanyl for pain.  MRI suggests ACL tear tomorrow, meniscal tear and a 1.2 cm osteochondral fracture  Patient states that she does not feel like her pain has been well controlled and would like to stay in the hospital overnight for pain management and social work consult.  Spoke with Dr. Damita Dunnings who accepted patient for admission.  Also consulted orthopedist on-call, Dr. Roland Rack.  Dr. Arnette Schaumann conveyed that this injury would most likely be managed with bracing and can follow-up with him as an outpatient.   ____________________________________________  FINAL CLINICAL IMPRESSION(S) / ED DIAGNOSES  Final diagnoses:  Fall, initial encounter      NEW MEDICATIONS STARTED DURING THIS VISIT:  ED Discharge Orders    None          This chart was dictated using voice recognition software/Dragon. Despite best efforts to proofread, errors can occur which can change the meaning. Any change was purely unintentional.     Karren Cobble 06/23/19 0005    Carrie Mew, MD 06/26/19 2358

## 2019-06-22 NOTE — H&P (Signed)
History and Physical    Tara Levine [id number] DOB: [birth year] DOA: 06/22/2019  PCP: Kerri Perches, PA-C   Patient coming from: home  I have personally briefly reviewed patient's old medical records in [place]  Chief Complaint: fall onto left knee  HPI: Tara Levine is a 64 y.o. female with medical history significant for Diabetes, hypertension, COPD, anxiety and depression, chronic diastolic heart failure, and morbid obesity, presents to the emergency room following a trip and fall onto the left knee, now with intractable pain.  She denies preceding lightheadedness, dizziness, chest pain, shortness of breath palpitations, blurred vision headache or weakness in an extremity or the face  ED Course: Patient had a trauma work-up in the emergency room with x-rays of her hip bilateral knee and ultimately had an MRI of the left knee that showed  IMPRESSION: 1. 1.2 cm osteochondral fracture seen at the inferior medial patellar facet without a displaced osteochondral fragment. 2. Large heterogeneous anteromedial prepatellar soft tissue hematoma with extensive subcutaneous edema. 3. Nondisplaced posterior medial meniscal tear. 4. Interstitial anterior ACL tear, however there are intact fibers seen throughout. 5. Small knee joint effusion  Due to inadequate pain relief in the emergency room, hospitalization requested. The ER provider spoke with orthopedist, Dr. Roland Rack who recommended "treated consveratively with knee bracing for 6-12 weeks with gradual increase in weight bearing status".   Review of Systems: As per HPI otherwise 10 point review of systems negative.    Past Medical History:  Diagnosis Date  . CML (chronic myelocytic leukemia) ([place])   . Depression   . DM type 2 (diabetes mellitus, type 2) ([place])   . Environmental allergies   . History of gastric ulcer   . HLD (hyperlipidemia)   . IDA (iron deficiency anemia)    chronic  . Leukemia ([place])   . Leukemia,  chronic myeloid ([place])   . Osteoarthritis   . Panic disorder   . PSS (progressive systemic sclerosis) ([place])   . Unspecified essential hypertension     Past Surgical History:  Procedure Laterality Date  . back and neck surgery  1999, 2009   had rods placed  . [facility]) No blackages found.   . CARPAL TUNNEL RELEASE    . PARTIAL HYSTERECTOMY     age 26, no cancer     reports that she has never smoked. She has never used smokeless tobacco. She reports that she does not drink alcohol or use drugs.  Allergies  Allergen Reactions  . Morphine Rash    Headache too  . Codeine Other (See Comments)    Reaction: Unknown   . Latex Other (See Comments)    Reaction:  Unknown   . Morphine And Related Other (See Comments)    Reaction:  Unknown   . Relafen [Nabumetone] Hives  . Nsaids Rash  . Tape Other (See Comments) and Rash    Reaction:  Unknown     Family History  Problem Relation Age of Onset  . Diabetes Mother   . Hypertension Mother   . Heart disease Mother   . Prostate cancer Father   . Heart disease Father   . Diabetes Brother      Prior to Admission medications   Medication Sig Start Date End Date Taking? Authorizing Provider  ACCU-CHEK GUIDE test strip TEST ONCE DAILY AND AS DIRECTED. 06/27/17   [provider]  ACCU-CHEK SOFTCLIX LANCETS lancets Use 1 each 3 (three) times daily.  Use as instructed. 09/11/11   [provider]  albuterol (PROVENTIL HFA;VENTOLIN HFA) 108 (90 BASE) MCG/ACT inhaler Inhale 2 puffs into the lungs every 6 (six) hours as needed for wheezing or shortness of breath.    [provider]  albuterol (PROVENTIL) (2.5 MG/3ML) 0.083% nebulizer solution INHALE CONTENTS OF 1 VIAL VIA NEBULIZER THREE TIMES A DAY prn 03/08/15   [provider]  ALPRAZolam Duanne Moron) 1 MG tablet Take 1 tablet (1 mg total) by mouth 3 (three) times daily. 02/02/19   Clapacs, Madie Reno, MD  Ascorbic Acid  (VITAMIN C) 1000 MG tablet Take 3,000 mg by mouth daily.    [provider]  Blood Glucose Monitoring Suppl (GLUCOCOM BLOOD GLUCOSE MONITOR) DEVI Use as directed. 09/11/11   [provider]  bosutinib (BOSULIF) 100 MG tablet Take 4 tablets (400 mg total) by mouth daily. Patient not taking: Reported on 12/28/2018 12/17/16   Lloyd Huger, MD  celecoxib (CELEBREX) 200 MG capsule TAKE 1 CAPSULE BY MOUTH TWO TIMES A DAY Patient not taking: Reported on 01/12/2019 03/25/17   Clapacs, Madie Reno, MD  cephALEXin (KEFLEX) 500 MG capsule Take 1 capsule (500 mg total) by mouth 4 (four) times daily. Patient not taking: Reported on 12/28/2018 11/29/18   Cuthriell, Charline [place], PA-C  cetirizine (ZYRTEC) 10 MG tablet Take 20 mg by mouth daily.     [provider]  Cholecalciferol (VITAMIN D PO) Take 1 tablet by mouth daily.    [provider]  clindamycin (CLEOCIN) 300 MG capsule Take 1 capsule (300 mg total) by mouth 4 (four) times daily. Patient not taking: Reported on 12/28/2018 11/29/18   Cuthriell, Charline [place], PA-C  co-enzyme Q-10 50 MG capsule Take 50 mg by mouth daily.    [provider]  diphenhydrAMINE (BENADRYL) 25 MG tablet Take 25 mg by mouth every 6 (six) hours as needed for itching or allergies.     [provider]  doxycycline (VIBRA-TABS) 100 MG tablet Take 1 tablet (100 mg total) by mouth 2 (two) times daily. Patient not taking: Reported on 12/28/2018 04/05/18   Norval Gable, MD  DULoxetine (CYMBALTA) 20 MG capsule Take 1 capsule (20 mg total) by mouth 3 (three) times daily. 02/02/19   Clapacs, Madie Reno, MD  fluconazole (DIFLUCAN) 150 MG tablet Take 1 tablet (150 mg total) by mouth daily. Patient not taking: Reported on 12/28/2018 04/05/18   Norval Gable, MD  fluticasone [facility]) 50 MCG/ACT nasal spray Place 1-2 sprays into both nostrils as needed for rhinitis.     [provider]  furosemide (LASIX) 40 MG tablet Take 1 tablet (40 mg total)  by mouth daily. 01/23/15   Cammie Sickle, MD  HYDROcodone-acetaminophen (NORCO/VICODIN) 5-325 MG tablet Take 1 tablet by mouth every 4 (four) hours as needed for moderate pain. Patient not taking: Reported on 12/28/2018 11/29/18   Cuthriell, Charline [place], PA-C  insulin aspart (NOVOLOG) 100 UNIT/ML injection Inject 15 Units into the skin 3 (three) times daily as needed for high blood sugar.     [provider]  insulin glargine (LANTUS) 100 UNIT/ML injection Inject 0.37 mLs (37 Units total) into the skin daily. 09/07/14   Theodoro Grist, MD  losartan (COZAAR) 100 MG tablet Take 100 mg by mouth daily.    [provider]  montelukast (SINGULAIR) 10 MG tablet Take 1 tablet (10 mg total) by mouth at bedtime. 10/13/18   Clapacs, Madie Reno, MD  Olopatadine HCl (PATADAY) 0.2 % SOLN Place 1 drop  into both eyes daily. 02/15/15   [provider]  Omega-3 Fatty Acids (OMEGA 3 PO) Take 3 capsules by mouth daily.    [provider]  omeprazole (PRILOSEC) 40 MG capsule Take by mouth. Reported on 04/21/2015    [provider]  ondansetron (ZOFRAN ODT) 8 MG disintegrating tablet Take 1 tablet (8 mg total) by mouth every 8 (eight) hours as needed. Patient not taking: Reported on 12/28/2018 04/05/18   Norval Gable, MD  oseltamivir (TAMIFLU) 75 MG capsule Take 1 capsule (75 mg total) by mouth 2 (two) times daily. Patient not taking: Reported on 12/28/2018 04/05/18   Norval Gable, MD  predniSONE (DELTASONE) 20 MG tablet Take 1 tablet (20 mg total) by mouth daily. Patient not taking: Reported on 12/28/2018 04/05/18   Norval Gable, MD  pregabalin (LYRICA) 300 MG capsule TAKE 1 CAPSULE BY MOUTH TWICE A DAY 02/05/19   Clapacs, Madie Reno, MD  pregabalin (LYRICA) 300 MG capsule Take 1 capsule (300 mg total) by mouth 2 (two) times daily. 02/02/19   Clapacs, Madie Reno, MD  spironolactone (ALDACTONE) 25 MG tablet Take 1 tablet (25 mg total) by mouth 2 (two) times daily. Patient not taking:  Reported on 12/28/2018 05/31/16   Lloyd Huger, MD  traZODone (DESYREL) 150 MG tablet Take 1 tablet (150 mg total) by mouth at bedtime. 02/02/19   Clapacs, Madie Reno, MD  vitamin B-12 (CYANOCOBALAMIN) 1000 MCG tablet Take 4,000 mcg by mouth daily.    [provider]    Physical Exam: Vitals:   06/22/19 1908 06/22/19 1911 06/22/19 2219  BP:  (!) 215/120 130/77  Pulse:  93   Resp:  20   SpO2:  94%   Weight: 127 kg    Height: 5\' 7"  (1.702 m)       Vitals:   06/22/19 1908 06/22/19 1911 06/22/19 2219  BP:  (!) 215/120 130/77  Pulse:  93   Resp:  20   SpO2:  94%   Weight: 127 kg    Height: 5\' 7"  (1.702 m)      Constitutional: Alert and awake, oriented x3, not in any acute distress. Eyes: PERLA, EOMI, irises appear normal, anicteric sclera,  ENMT: external ears and nose appear normal, normal hearing             Lips appears normal, oropharynx mucosa, tongue, posterior pharynx appear normal  Neck: neck appears normal, no masses, normal ROM, no thyromegaly, no JVD  CVS: S1-S2 clear, no murmur rubs or gallops,  , no carotid bruits, pedal pulses palpable, No LE edema Respiratory:  clear to auscultation bilaterally, no wheezing, rales or rhonchi. Respiratory effort normal. No accessory muscle use.  Abdomen: soft nontender, nondistended, normal bowel sounds, no hepatosplenomegaly, no hernias Musculoskeletal: Pain and swelling left knee with decreased range of motion, pain on range of motion Neuro: Cranial nerves II-XII intact, sensation, reflexes normal, strength Psych: judgement and insight appear normal, depressed affect Skin: Abrasion left knee, and palm of right hand  Labs on Admission: I have personally reviewed following labs and imaging studies  CBC: Recent Labs  Lab 06/22/19 2151  WBC 5.3  NEUTROABS 3.8  HGB 12.5  HCT 36.8  MCV 84.6  PLT [id number]   Basic Metabolic Panel: Recent Labs  Lab 06/22/19 2151  NA 135  K 4.4  CL 99  CO2 26  GLUCOSE 368*  BUN 22   CREATININE 0.74  CALCIUM 9.0   GFR: Estimated Creatinine Clearance: 98.5 mL/min (by C-G formula  based on SCr of 0.74 mg/dL). Liver Function Tests: Recent Labs  Lab 06/22/19 2151  AST 18  ALT 18  ALKPHOS 64  BILITOT 0.7  PROT 6.2*  ALBUMIN 3.2*   No results for input(s): LIPASE, AMYLASE in the last 168 hours. No results for input(s): AMMONIA in the last 168 hours. Coagulation Profile: No results for input(s): INR, PROTIME in the last 168 hours. Cardiac Enzymes: No results for input(s): CKTOTAL, CKMB, CKMBINDEX, TROPONINI in the last 168 hours. BNP (last 3 results) No results for input(s): PROBNP in the last 8760 hours. HbA1C: No results for input(s): HGBA1C in the last 72 hours. CBG: Recent Labs  Lab 06/22/19 2218  GLUCAP 335*   Lipid Profile: No results for input(s): CHOL, HDL, LDLCALC, TRIG, CHOLHDL, LDLDIRECT in the last 72 hours. Thyroid Function Tests: No results for input(s): TSH, T4TOTAL, FREET4, T3FREE, THYROIDAB in the last 72 hours. Anemia Panel: No results for input(s): VITAMINB12, FOLATE, FERRITIN, TIBC, IRON, RETICCTPCT in the last 72 hours. Urine analysis:    Component Value Date/Time   COLORURINE STRAW (A) 08/31/2014 1930   APPEARANCEUR CLEAR (A) 08/31/2014 1930   APPEARANCEUR Clear 02/07/2012 1506   LABSPEC 1.005 08/31/2014 1930   LABSPEC 1.006 02/07/2012 1506   PHURINE 9.0 (H) 08/31/2014 1930   GLUCOSEU NEGATIVE 08/31/2014 1930   GLUCOSEU Negative 02/07/2012 1506   HGBUR NEGATIVE 08/31/2014 1930   [place] NEGATIVE 08/31/2014 1930   BILIRUBINUR Negative 02/07/2012 1506   [place] NEGATIVE 08/31/2014 1930   PROTEINUR NEGATIVE 08/31/2014 1930   NITRITE NEGATIVE 08/31/2014 1930   LEUKOCYTESUR NEGATIVE 08/31/2014 1930   LEUKOCYTESUR Negative 02/07/2012 1506    Radiological Exams on Admission: MR KNEE LEFT WO CONTRAST  Result Date: 06/22/2019 CLINICAL DATA:  Knee instability, tripped and fell directly on knee EXAM: MRI OF THE LEFT KNEE  WITHOUT CONTRAST TECHNIQUE: Multiplanar, multisequence MR imaging of the knee was performed. No intravenous contrast was administered. COMPARISON:  June 22, 2019 FINDINGS: MENISCI Medial: There is a nondisplaced horizontal longitudinal tear seen of the posterior horn of the medial meniscus extending to the mid body. Lateral: Intact. LIGAMENTS Cruciates: There is a tiny interstitial tear seen at the anterior ACL at the femoral insertion site. However there is intact fibers seen throughout. The PCL is intact. Collaterals: The MCL is intact. The lateral collateral ligamentous complex is intact. CARTILAGE Patellofemoral: As described below there is an osteochondral injury at the medial patellar facet. Medial compartment: Mild chondral fissuring seen the weight-bearing surface of the medial femoral condyle. Lateral compartment: Mild chondral fissuring seen the weight-bearing surface of lateral femoral condyle. BONES: There is an focal osteochondral fracture seen at the inferior medial patellar facet measuring 1.2 cm in length. No definite displaced osteochondral fragment however is seen. JOINT: There is a small knee joint effusion with scattered debris. EXTENSOR MECHANISM: The patellar and quadriceps tendon are intact. The retinaculum is unremarkable. POPLITEAL FOSSA: No popliteal cyst. OTHER: There is a large heterogeneous multi lobular 10.6 x 3.1 cm soft tissue hematoma seen overlying the anteromedial prepatellar knee which extends to the proximal tibia. There is extensive overlying subcutaneous edema seen. IMPRESSION: 1. 1.2 cm osteochondral fracture seen at the inferior medial patellar facet without a displaced osteochondral fragment. 2. Large heterogeneous anteromedial prepatellar soft tissue hematoma with extensive subcutaneous edema. 3. Nondisplaced posterior medial meniscal tear. 4. Interstitial anterior ACL tear, however there are intact fibers seen throughout. 5. Small knee joint effusion. Electronically  Signed   By: Prudencio Pair M.D.   On:  06/22/2019 23:22   DG Knee Complete 4 Views Left  Result Date: 06/22/2019 CLINICAL DATA:  Pain following fall EXAM: LEFT KNEE - COMPLETE 4+ VIEW COMPARISON:  None. FINDINGS: Frontal, lateral, and bilateral oblique views were obtained. There is no evident acute fracture or dislocation. There is marked soft tissue swelling in the prepatellar region with probable soft tissue hematoma in this area. There is a small joint effusion. No joint space narrowing or erosion. IMPRESSION: Marked soft tissue swelling in the prepatellar region with probable hematoma throughout this area. Small joint effusion evident. No fracture or dislocation. No joint space narrowing or erosion. Electronically Signed   By: Lowella Grip III M.D.   On: 06/22/2019 19:46   DG Hip Unilat W or Wo Pelvis 2-3 Views Left  Result Date: 06/22/2019 CLINICAL DATA:  Pain following fall EXAM: DG HIP (WITH OR WITHOUT PELVIS) 2-3V LEFT COMPARISON:  Left hip radiographs January 21, 2013 FINDINGS: Frontal pelvis as well as frontal and lateral left hip images were obtained. No fracture or dislocation. Joint spaces appear unremarkable. No erosive change. There is postoperative change in the visualized lower lumbar spine. IMPRESSION: No fracture or dislocation. No appreciable arthropathic change. Postoperative change in visualized lumbar spine. Electronically Signed   By: Lowella Grip III M.D.   On: 06/22/2019 19:47      Assessment/Plan Principal Problem:   Accidental fall   Closed displaced osteochondral fracture of left patella   Left knee injury with intractable pain -MRI showed meniscal injury, osteochondral fracture of the patella with large prepatellar hematoma -Pain control -Orthopedist recommended conservative management with knee brace for 6 to 12 weeks with gradual increase in weightbearing status -Physical therapy evaluation, transitional care to obtain brace, -Outpatient follow-up with  orthopedics    HTN (hypertension) -Continue home meds pending med rec    Chronic diastolic CHF (congestive heart failure) ([facility])   OSA (obstructive sleep apnea)   Diastolic heart failure ([place]) -Patient euvolemic -Continue spironolactone, furosemide, losartan pending med rec.  Not currently on beta-blocker    Obesity, Class III, BMI 40-49.9 (morbid obesity) ([place]) -This potentially complicates overall prognosis and care  Hyperglycemia in type 2 diabetes mellitus -Blood sugar was 368 -Sliding scale insulin pending med rec    COPD with chronic bronchitis ([place]) -DuoNebs as needed pending med rec    Anxiety -Continue home Cymbalta, Xanax, trazodone      DVT prophylaxis: SCDs given hematoma Code Status: full code  Family Communication:  none  Disposition Plan: Back to previous home environment Consults called: none  Status:obs    Athena Masse MD [facility]     06/22/2019, 11:56 PM

## 2019-06-22 NOTE — ED Notes (Signed)
Pt placed in gown and assisted with movement in bed.

## 2019-06-23 ENCOUNTER — Encounter: Payer: Self-pay | Admitting: Internal Medicine

## 2019-06-23 DIAGNOSIS — S82012D Displaced osteochondral fracture of left patella, subsequent encounter for closed fracture with routine healing: Secondary | ICD-10-CM

## 2019-06-23 DIAGNOSIS — E0865 Diabetes mellitus due to underlying condition with hyperglycemia: Secondary | ICD-10-CM

## 2019-06-23 DIAGNOSIS — W19XXXD Unspecified fall, subsequent encounter: Secondary | ICD-10-CM

## 2019-06-23 DIAGNOSIS — Z794 Long term (current) use of insulin: Secondary | ICD-10-CM

## 2019-06-23 LAB — RESPIRATORY PANEL BY RT PCR (FLU A&B, COVID)
Influenza A by PCR: NEGATIVE
Influenza B by PCR: NEGATIVE
SARS Coronavirus 2 by RT PCR: NEGATIVE

## 2019-06-23 LAB — CBC
HCT: 36.1 % (ref 36.0–46.0)
Hemoglobin: 12.2 g/dL (ref 12.0–15.0)
MCH: 28.6 pg (ref 26.0–34.0)
MCHC: 33.8 g/dL (ref 30.0–36.0)
MCV: 84.7 fL (ref 80.0–100.0)
Platelets: 276 10*3/uL (ref 150–400)
RBC: 4.26 MIL/uL (ref 3.87–5.11)
RDW: 12.5 % (ref 11.5–15.5)
WBC: 6.9 10*3/uL (ref 4.0–10.5)
nRBC: 0 % (ref 0.0–0.2)

## 2019-06-23 LAB — HEMOGLOBIN A1C
Hgb A1c MFr Bld: 11.1 % — ABNORMAL HIGH (ref 4.8–5.6)
Mean Plasma Glucose: 271.87 mg/dL

## 2019-06-23 LAB — GLUCOSE, CAPILLARY
Glucose-Capillary: 126 mg/dL — ABNORMAL HIGH (ref 70–99)
Glucose-Capillary: 283 mg/dL — ABNORMAL HIGH (ref 70–99)
Glucose-Capillary: 312 mg/dL — ABNORMAL HIGH (ref 70–99)
Glucose-Capillary: 323 mg/dL — ABNORMAL HIGH (ref 70–99)
Glucose-Capillary: 381 mg/dL — ABNORMAL HIGH (ref 70–99)

## 2019-06-23 LAB — HIV ANTIBODY (ROUTINE TESTING W REFLEX): HIV Screen 4th Generation wRfx: NONREACTIVE

## 2019-06-23 MED ORDER — DIPHENHYDRAMINE HCL 25 MG PO TABS: 25.0000 mg | ORAL_TABLET | Freq: Four times a day (QID) | ORAL | Status: DC | PRN

## 2019-06-23 MED ORDER — MONTELUKAST SODIUM 10 MG PO TABS
10.0000 mg | ORAL_TABLET | Freq: Every day | ORAL | Status: DC
  Administered 2019-06-23 – 2019-06-24 (×2): 10 mg via ORAL
  Filled 2019-06-23 (×2): qty 1

## 2019-06-23 MED ORDER — ENOXAPARIN SODIUM 40 MG/0.4ML ~~LOC~~ SOLN
40.0000 mg | Freq: Two times a day (BID) | SUBCUTANEOUS | Status: DC
  Administered 2019-06-23 – 2019-06-25 (×4): 40 mg via SUBCUTANEOUS
  Filled 2019-06-23 (×4): qty 0.4

## 2019-06-23 MED ORDER — ALBUTEROL SULFATE (2.5 MG/3ML) 0.083% IN NEBU: 2.5000 mg | INHALATION_SOLUTION | Freq: Four times a day (QID) | RESPIRATORY_TRACT | Status: DC | PRN

## 2019-06-23 MED ORDER — ASCORBIC ACID 500 MG PO TABS
3000.0000 mg | ORAL_TABLET | Freq: Every day | ORAL | Status: DC
  Administered 2019-06-24 – 2019-06-25 (×2): 3000 mg via ORAL
  Filled 2019-06-23 (×2): qty 6

## 2019-06-23 MED ORDER — HYDROXYZINE HCL 50 MG/ML IM SOLN
25.0000 mg | Freq: Once | INTRAMUSCULAR | Status: AC
  Administered 2019-06-23: 12:00:00 25 mg via INTRAMUSCULAR
  Filled 2019-06-23: qty 0.5

## 2019-06-23 MED ORDER — DULOXETINE HCL 20 MG PO CPEP
20.0000 mg | ORAL_CAPSULE | Freq: Three times a day (TID) | ORAL | Status: DC
  Administered 2019-06-23 – 2019-06-25 (×7): 20 mg via ORAL
  Filled 2019-06-23 (×9): qty 1

## 2019-06-23 MED ORDER — OLOPATADINE HCL 0.1 % OP SOLN
1.0000 [drp] | Freq: Two times a day (BID) | OPHTHALMIC | Status: DC
  Administered 2019-06-23 – 2019-06-25 (×5): 1 [drp] via OPHTHALMIC
  Filled 2019-06-23: qty 5

## 2019-06-23 MED ORDER — HYDROMORPHONE HCL 1 MG/ML IJ SOLN
0.5000 mg | Freq: Once | INTRAMUSCULAR | Status: AC
  Administered 2019-06-23: 0.5 mg via INTRAVENOUS
  Filled 2019-06-23: qty 1

## 2019-06-23 MED ORDER — CO-ENZYME Q-10 50 MG PO CAPS: 50.0000 mg | ORAL_CAPSULE | Freq: Every day | ORAL | Status: DC

## 2019-06-23 MED ORDER — PREGABALIN 75 MG PO CAPS
600.0000 mg | ORAL_CAPSULE | Freq: Every day | ORAL | Status: DC
  Administered 2019-06-23 – 2019-06-24 (×2): 600 mg via ORAL
  Filled 2019-06-23 (×2): qty 8

## 2019-06-23 MED ORDER — INSULIN GLARGINE 100 UNIT/ML ~~LOC~~ SOLN
30.0000 [IU] | Freq: Every day | SUBCUTANEOUS | Status: DC
  Administered 2019-06-23: 30 [IU] via SUBCUTANEOUS
  Filled 2019-06-23 (×2): qty 0.3

## 2019-06-23 MED ORDER — INSULIN ASPART 100 UNIT/ML ~~LOC~~ SOLN
8.0000 [IU] | Freq: Three times a day (TID) | SUBCUTANEOUS | Status: DC
  Administered 2019-06-23: 8 [IU] via SUBCUTANEOUS
  Filled 2019-06-23: qty 1

## 2019-06-23 MED ORDER — TRAZODONE HCL 50 MG PO TABS
150.0000 mg | ORAL_TABLET | Freq: Every day | ORAL | Status: DC
  Administered 2019-06-23 – 2019-06-24 (×2): 150 mg via ORAL
  Filled 2019-06-23 (×2): qty 1

## 2019-06-23 MED ORDER — LOSARTAN POTASSIUM 50 MG PO TABS
100.0000 mg | ORAL_TABLET | Freq: Every day | ORAL | Status: DC
  Administered 2019-06-23: 100 mg via ORAL
  Filled 2019-06-23: qty 2

## 2019-06-23 MED ORDER — LORATADINE 10 MG PO TABS
10.0000 mg | ORAL_TABLET | Freq: Every day | ORAL | Status: DC
  Administered 2019-06-23 – 2019-06-25 (×3): 10 mg via ORAL
  Filled 2019-06-23 (×3): qty 1

## 2019-06-23 MED ORDER — FLUTICASONE PROPIONATE 50 MCG/ACT NA SUSP
1.0000 | NASAL | Status: DC | PRN
  Filled 2019-06-23: qty 16

## 2019-06-23 MED ORDER — VITAMIN D 25 MCG (1000 UNIT) PO TABS
1000.0000 [IU] | ORAL_TABLET | Freq: Every day | ORAL | Status: DC
  Administered 2019-06-23 – 2019-06-25 (×3): 1000 [IU] via ORAL
  Filled 2019-06-23 (×3): qty 1

## 2019-06-23 MED ORDER — ALPRAZOLAM 0.5 MG PO TABS
1.0000 mg | ORAL_TABLET | Freq: Three times a day (TID) | ORAL | Status: DC
  Administered 2019-06-23 – 2019-06-25 (×7): 1 mg via ORAL
  Filled 2019-06-23 (×7): qty 2

## 2019-06-23 MED ORDER — LIVING WELL WITH DIABETES BOOK
Freq: Once | Status: AC
  Filled 2019-06-23: qty 1

## 2019-06-23 MED ORDER — DIPHENHYDRAMINE HCL 25 MG PO CAPS
25.0000 mg | ORAL_CAPSULE | Freq: Four times a day (QID) | ORAL | Status: DC | PRN
  Administered 2019-06-23 – 2019-06-24 (×4): 25 mg via ORAL
  Filled 2019-06-23 (×5): qty 1

## 2019-06-23 MED ORDER — FUROSEMIDE 40 MG PO TABS
40.0000 mg | ORAL_TABLET | Freq: Every day | ORAL | Status: DC
  Administered 2019-06-23 – 2019-06-25 (×3): 40 mg via ORAL
  Filled 2019-06-23 (×3): qty 1

## 2019-06-23 MED ORDER — INSULIN GLARGINE 100 UNIT/ML ~~LOC~~ SOLN
35.0000 [IU] | Freq: Every day | SUBCUTANEOUS | Status: DC
  Filled 2019-06-23: qty 0.35

## 2019-06-23 MED ORDER — PANTOPRAZOLE SODIUM 40 MG PO TBEC
40.0000 mg | DELAYED_RELEASE_TABLET | Freq: Every day | ORAL | Status: DC
  Administered 2019-06-23 – 2019-06-25 (×3): 40 mg via ORAL
  Filled 2019-06-23 (×3): qty 1

## 2019-06-23 MED ORDER — INSULIN ASPART 100 UNIT/ML ~~LOC~~ SOLN
12.0000 [IU] | Freq: Three times a day (TID) | SUBCUTANEOUS | Status: DC
  Administered 2019-06-23: 12 [IU] via SUBCUTANEOUS
  Filled 2019-06-23: qty 1

## 2019-06-23 NOTE — Plan of Care (Signed)
  RD consulted for nutrition education regarding diabetes.  RD working remotely.  Lab Results  Component Value Date   HGBA1C 11.1 (H) 06/23/2019   Spoke with patient over the phone. She reports she has a lot going on right now. Patient was very tearful. She is worried about discharge and being able to get a brace she needs when she cannot walk well. 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 Patient unable to provide many details on typical intake. She reports she does not follow carbohydrate modified diet and just eats when she can because she is very busy. Briefly reviewed handout with patient but she reports today is not a good day as she is busy with upcoming discharge and trying to figure everything out. Requested RD mail handout to patient and she would review at home.   RD reviewed "Carbohydrate Counting for People with Diabetes" handout from the Academy of [employer] with patient over the phone. RD will mail handout to patient. Discussed different food groups and their effects on blood sugar, emphasizing carbohydrate-containing foods. Provided list of carbohydrates and recommended serving sizes of common foods.  Discussed importance of controlled and consistent carbohydrate intake throughout the day. Provided examples of ways to balance meals/snacks and encouraged intake of high-fiber, whole grain complex carbohydrates. Teach back method used.  Expect fair compliance.  Current diet order is heart healthy/carbohydrate modified. Meal completion is not documented at this time and patient was unable to tell RD how she was eating at meals. Labs and medications reviewed. No further nutrition interventions warranted at this time. RD contact information provided. If additional nutrition issues arise, please re-consult RD.  Jacklynn Barnacle, MS, RD, LDN Pager number available on Amion

## 2019-06-23 NOTE — Progress Notes (Signed)
Inpatient Diabetes Program Recommendations  AACE/ADA: New Consensus Statement on Inpatient Glycemic Control   Target Ranges:  Prepandial:   less than 140 mg/dL      Peak postprandial:   less than 180 mg/dL (1-2 hours)      Critically ill patients:  140 - 180 mg/dL  Results for Tara Levine, Tara Levine (MRN [id number]) as of 06/23/2019 09:18  Ref. Range 06/22/2019 22:18 06/23/2019 00:23 06/23/2019 07:46  Glucose-Capillary Latest Ref Range: 70 - 99 mg/dL 335 (H) 323 (H) 381 (H)   Results for Tara Levine, Tara Levine (MRN [id number]) as of 06/23/2019 09:18  Ref. Range 06/22/2019 21:51  Glucose Latest Ref Range: 70 - 99 mg/dL 368 (H)   Review of Glycemic Control  Diabetes history: DM2 Outpatient Diabetes medications: Lantus 20 units daily, Novolog 4-8 units TID Current orders for Inpatient glycemic control: Lantus 30 units daily, Novolog 8 units TID with meals, Novolog 0-20 units TID with meals, Novolog 0-5 units QHS  Inpatient Diabetes Program Recommendations:   HbgA1C:  A1C in process. Patient reports glucose is consistently in 300's mg/dl at home and likely needs changes made with outpatient DM regimen. Discharging MD may want to consider adjusting outpatient Lantus and Novolog at time of discharge.  NOTE: Spoke with patient over the phone about diabetes and home regimen for diabetes control. Patient reports being followed by PCP at [facility] for diabetes management and currently taking Lantus 20 units daily and Novolog 4-8 units TID with meals as an outpatient for diabetes control. Patient reports taking DM medications as prescribed and notes that she is able to get insulin at [facility]. Patient reports that she seen PCP yesterday and was told to increase Lantus and Novolog by 1 unit each day if glucose is elevated. Patient also notes that she filled out paperwork yesterday to see if she can qualify to get Trulicity to help improve DM control.  Patient reports checking glucose at home and notes it is usually in  the 300's mg/dl.  Inquired about prior A1C and patient reports not being able to recall last A1C value. Informed patient that current A1C has been ordered but not resulted yet but would anticipate it will be elevated since her glucose is averaging over 300 mg/dl at home.  Discussed glucose and A1C goals. Discussed importance of checking CBGs and maintaining good CBG control to prevent long-term and short-term complications. Explained how hyperglycemia leads to damage within blood vessels which lead to the common complications seen with uncontrolled diabetes. Stressed to the patient the importance of improving glycemic control to prevent further complications from uncontrolled diabetes. Discussed impact of nutrition, exercise, stress, sickness, and medications on diabetes control. Patient states that her brother passed away in 2022-05-23 and that she has to care for her husband who suffered a stroke in the past. Patient notes that she is very stressed and tired all the time. Patient states that she drinks mainly water at home but admits that she does not follow a carb modified diet because "I run around like a chicken with my head cut off and eat whatever I can when I can find time."   Discussed carbohydrate modified diet and encouraged patient to try to make dietary changes.  Patient states that she has a follow up appointment in one month with PCP at [facility]. Encouraged patient to continue to make changes with her Lantus and Novolog as PCP has instructed and be sure to follow up with PCP as scheduled.  Patient verbalized understanding of information discussed  and reports no further questions at this time related to diabetes.  Thanks, Barnie Alderman, RN, MSN, CDE Diabetes Coordinator Inpatient Diabetes Program [phone] (Team Pager)

## 2019-06-23 NOTE — Plan of Care (Signed)
  Problem: Health Behavior/Discharge Planning: Goal: Ability to manage health-related needs will improve Outcome: Progressing   Problem: Clinical Measurements: Goal: Will remain free from infection Outcome: Progressing   Problem: Coping: Goal: Level of anxiety will decrease Outcome: Not Progressing Note: Patient tearful and reporting fear of increasing pain. Stated she was in a "panic". Writer assisted patient with guided imagery activity with positive results. Patient currently resting in bed with eyes closed. Will continue to monitor.    Problem: Pain Managment: Goal: General experience of comfort will improve Outcome: Not Progressing

## 2019-06-23 NOTE — TOC Transition Note (Addendum)
Transition of Care [facility]) - CM/SW Discharge Note   Patient Details  Name: Tara Levine MRN: [id number] Date of Birth: 1955-07-30  Transition of Care [facility]) CM/SW Contact:  Su Hilt, RN Phone Number: 06/23/2019, 12:24 PM   Clinical Narrative:    Met with the patient to discuss DC plan and needs, she lives at home with her husband who has hospice, they are taking care of respit care for him, Due to having no insurance I explained that I was unable to locate a SNF to accept her for charity, she stated that there was no way she could pay for anything,  she needs a RW and a BSC and has no insurance She was  set up with [employer] for charity DME, The RW and 3 in 1 was brought into the room for her to take home, I called Cassie and left a VM with [facility] for [facility] PT and OT, [facility] she goes to the doctor at [place] [facility] and gets her medications there as well, no additional needs   Final next level of care: Home w Home Health Services Barriers to Discharge: Barriers Resolved   Patient Goals and CMS Choice Patient states their goals for this hospitalization and ongoing recovery are:: get better      Discharge Placement                       Discharge Plan and Services   Discharge Planning Services: CM Consult            DME Arranged: 3-N-1, Walker rolling DME Agency: [employer] Date DME Agency Contacted: 06/23/19 Time DME Agency Contacted: 1221 Representative spoke with at DME Agency: Leroy [facility] HH Arranged: PT, OT HH Agency: [facility] Date [place]: 06/23/19 Time Hall: 1223 Representative spoke with at [place]: [place] ([place]) Interventions     Readmission Risk Interventions No flowsheet data found.

## 2019-06-23 NOTE — Progress Notes (Signed)
Orthopedic Tech Progress Note Patient Details:  Tara Levine [birth year] [id number] Hinge Knee Brace has been called in Patient ID: Tara Levine, female   DOB: 1955-06-13, 64 y.o.   MRN: [id number]   Jearld Lesch 06/23/2019, 12:01 PM

## 2019-06-23 NOTE — Progress Notes (Addendum)
PROGRESS NOTE    Tara Levine  [id number] DOB: 06/15/1955 DOA: 06/22/2019 PCP: Kerri Perches, PA-C    Chief Complaint  Patient presents with  . Fall    Brief Narrative:  64 year old morbidly obese female with history of diabetes mellitus, hypertension, COPD, severe anxiety with depression, chronic diastolic CHF presented to the ED after a mechanical fall landing onto the left knee.  Denies any dizziness, lightheadedness, hitting her head or loss of consciousness.  Did not sustain any other injuries. Trauma work-up done in the ED which included MRI of the left knee showed 1.2 cm osteochondral fracture seen at the inferior medial patellar facet without displaced osteochondral fragment.  Large heterogeneous anterior medial prepatellar soft tissue hematoma with extensive subcutaneous edema.  Nondisplaced posterior medial meniscal tear, interstitial anterior ACL tear and small knee joint effusion.    Assessment & Plan:   Principal Problem: Mechanical fall with closed displaced left patella fracture Left knee injury with meniscal tear and intractable pain MRI findings as above.  Pain control with oxycodone and as needed IV Dilaudid.  Add bowel regimen.  Discussed with orthopedics consult Dr. Roland Rack (official consult not done).  Recommends conservative management with posterior knee brace for 6-12 weeks with gradual increase in weightbearing status.  Follow-up in the office in 2 weeks. PT evaluation pending.  Active Problems: Uncontrolled type 2 diabetes mellitus with hyperglycemia Patient on Lantus and Premeal aspart at home.  CBG elevated so I have placed her on 30 units Lantus with aspart 8 units 3 times daily.  Will adjust dose further if patient has adequate meal intake.  Severe anxiety and depression Resume home dose Xanax.  Resume Cymbalta, trazodone and Lyrica.  Essential hypertension Patient hypertensive on presentation likely due to pain.  Now stable.  Continue losartan and  Lasix.  COPD with chronic bronchitis Stable.  Continue albuterol nebs as needed and Singulair.  Diastolic CHF ([place]) Euvolemic.  Continue Lasix.  Morbid obesity (BMI 43 kg/m)   DVT prophylaxis: Subcu Lovenox Code Status: Full code Family Communication: Husband involved in care Disposition:   Status is: Inpatient  Remains inpatient appropriate because:Ongoing active pain requiring inpatient pain management Patient unsafe to be discharged home due to ongoing pain requiring frequent IV narcotics and pending PT evaluation.  Dispo: The patient is from: Home              Anticipated d/c is to: Home              Anticipated d/c date is: 1 day              Patient currently is not medically stable to d/c.            Procedures: MRI left knee,   Antimicrobials: None  Subjective: Seen and examined.  Patient extremely anxious asking for more Benadryl for itching.  Pain stable on current meds.  Objective: Vitals:   06/22/19 1911 06/22/19 2219 06/23/19 0240 06/23/19 0800  BP: (!) 215/120 130/77 (!) 142/80 122/79  Pulse: 93  87 (!) 102  Resp: 20  18 16   Temp:   97.8 F (36.6 C) 97.7 F (36.5 C)  TempSrc:   Oral Oral  SpO2: 94%  95% 97%  Weight:      Height:       No intake or output data in the 24 hours ending 06/23/19 1453 Filed Weights   06/22/19 1908  Weight: 127 kg    Examination:  General: Morbidly obese female appears restless and anxious  HEENT: Moist mucosa, supple neck Chest: Clear bilaterally CVs: Normal S1-S2, GI: Soft, nondistended, nontender Musculoskeletal: Left knee swollen, is able to minimally flex the left knee and move the leg    Data Reviewed: I have personally reviewed following labs and imaging studies  CBC: Recent Labs  Lab 06/22/19 2151 06/23/19 0541  WBC 5.3 6.9  NEUTROABS 3.8  --   HGB 12.5 12.2  HCT 36.8 36.1  MCV 84.6 84.7  PLT 242 [id number]    Basic Metabolic Panel: Recent Labs  Lab 06/22/19 2151  NA 135  K 4.4  CL  99  CO2 26  GLUCOSE 368*  BUN 22  CREATININE 0.74  CALCIUM 9.0    GFR: Estimated Creatinine Clearance: 98.5 mL/min (by C-G formula based on SCr of 0.74 mg/dL).  Liver Function Tests: Recent Labs  Lab 06/22/19 2151  AST 18  ALT 18  ALKPHOS 64  BILITOT 0.7  PROT 6.2*  ALBUMIN 3.2*    CBG: Recent Labs  Lab 06/22/19 2218 06/23/19 0023 06/23/19 0746 06/23/19 1147  GLUCAP 335* 323* 381* 283*     Recent Results (from the past 240 hour(s))  Respiratory Panel by RT PCR (Flu A&B, Covid) - Nasopharyngeal Swab     Status: None   Collection Time: 06/23/19  3:38 AM   Specimen: Nasopharyngeal Swab  Result Value Ref Range Status   SARS Coronavirus 2 by RT PCR NEGATIVE NEGATIVE Final    Comment: (NOTE) SARS-CoV-2 target nucleic acids are NOT DETECTED. The SARS-CoV-2 RNA is generally detectable in upper respiratoy specimens during the acute phase of infection. The lowest concentration of SARS-CoV-2 viral copies this assay can detect is 131 copies/mL. A negative result does not preclude SARS-Cov-2 infection and should not be used as the sole basis for treatment or other patient management decisions. A negative result may occur with  improper specimen collection/handling, submission of specimen other than nasopharyngeal swab, presence of viral mutation(s) within the areas targeted by this assay, and inadequate number of viral copies (<131 copies/mL). A negative result must be combined with clinical observations, patient history, and epidemiological information. The expected result is Negative. Fact Sheet for Patients:  PinkCheek.be Fact Sheet for Healthcare Providers:  GravelBags.it This test is not yet ap proved or cleared by the [place] FDA and  has been authorized for detection and/or diagnosis of SARS-CoV-2 by FDA under an Emergency Use Authorization (EUA). This EUA will remain  in effect (meaning this test  can be used) for the duration of the COVID-19 declaration under Section 564(b)(1) of the Act, 21 U.S.C. section 360bbb-3(b)(1), unless the authorization is terminated or revoked sooner.    Influenza A by PCR NEGATIVE NEGATIVE Final   Influenza B by PCR NEGATIVE NEGATIVE Final    Comment: (NOTE) The Xpert Xpress SARS-CoV-2/FLU/RSV assay is intended as an aid in  the diagnosis of influenza from Nasopharyngeal swab specimens and  should not be used as a sole basis for treatment. Nasal washings and  aspirates are unacceptable for Xpert Xpress SARS-CoV-2/FLU/RSV  testing. Fact Sheet for Patients: PinkCheek.be Fact Sheet for Healthcare Providers: GravelBags.it This test is not yet approved or cleared by the [place] FDA and  has been authorized for detection and/or diagnosis of SARS-CoV-2 by  FDA under an Emergency Use Authorization (EUA). This EUA will remain  in effect (meaning this test can be used) for the duration of the  Covid-19 declaration under Section 564(b)(1) of the Act, 21  U.S.C. section 360bbb-3(b)(1), unless the authorization is  terminated or revoked. Performed at [facility], [address] [place], [place] [address]          Radiology Studies: MR KNEE LEFT WO CONTRAST  Result Date: 06/22/2019 CLINICAL DATA:  Knee instability, tripped and fell directly on knee EXAM: MRI OF THE LEFT KNEE WITHOUT CONTRAST TECHNIQUE: Multiplanar, multisequence MR imaging of the knee was performed. No intravenous contrast was administered. COMPARISON:  June 22, 2019 FINDINGS: MENISCI Medial: There is a nondisplaced horizontal longitudinal tear seen of the posterior horn of the medial meniscus extending to the mid body. Lateral: Intact. LIGAMENTS Cruciates: There is a tiny interstitial tear seen at the anterior ACL at the femoral insertion site. However there is intact fibers seen throughout. The PCL is intact.  Collaterals: The MCL is intact. The lateral collateral ligamentous complex is intact. CARTILAGE Patellofemoral: As described below there is an osteochondral injury at the medial patellar facet. Medial compartment: Mild chondral fissuring seen the weight-bearing surface of the medial femoral condyle. Lateral compartment: Mild chondral fissuring seen the weight-bearing surface of lateral femoral condyle. BONES: There is an focal osteochondral fracture seen at the inferior medial patellar facet measuring 1.2 cm in length. No definite displaced osteochondral fragment however is seen. JOINT: There is a small knee joint effusion with scattered debris. EXTENSOR MECHANISM: The patellar and quadriceps tendon are intact. The retinaculum is unremarkable. POPLITEAL FOSSA: No popliteal cyst. OTHER: There is a large heterogeneous multi lobular 10.6 x 3.1 cm soft tissue hematoma seen overlying the anteromedial prepatellar knee which extends to the proximal tibia. There is extensive overlying subcutaneous edema seen. IMPRESSION: 1. 1.2 cm osteochondral fracture seen at the inferior medial patellar facet without a displaced osteochondral fragment. 2. Large heterogeneous anteromedial prepatellar soft tissue hematoma with extensive subcutaneous edema. 3. Nondisplaced posterior medial meniscal tear. 4. Interstitial anterior ACL tear, however there are intact fibers seen throughout. 5. Small knee joint effusion. Electronically Signed   By: Prudencio Pair M.D.   On: 06/22/2019 23:22   DG Knee Complete 4 Views Left  Result Date: 06/22/2019 CLINICAL DATA:  Pain following fall EXAM: LEFT KNEE - COMPLETE 4+ VIEW COMPARISON:  None. FINDINGS: Frontal, lateral, and bilateral oblique views were obtained. There is no evident acute fracture or dislocation. There is marked soft tissue swelling in the prepatellar region with probable soft tissue hematoma in this area. There is a small joint effusion. No joint space narrowing or erosion.  IMPRESSION: Marked soft tissue swelling in the prepatellar region with probable hematoma throughout this area. Small joint effusion evident. No fracture or dislocation. No joint space narrowing or erosion. Electronically Signed   By: Lowella Grip III M.D.   On: 06/22/2019 19:46   DG Hip Unilat W or Wo Pelvis 2-3 Views Left  Result Date: 06/22/2019 CLINICAL DATA:  Pain following fall EXAM: DG HIP (WITH OR WITHOUT PELVIS) 2-3V LEFT COMPARISON:  Left hip radiographs January 21, 2013 FINDINGS: Frontal pelvis as well as frontal and lateral left hip images were obtained. No fracture or dislocation. Joint spaces appear unremarkable. No erosive change. There is postoperative change in the visualized lower lumbar spine. IMPRESSION: No fracture or dislocation. No appreciable arthropathic change. Postoperative change in visualized lumbar spine. Electronically Signed   By: Lowella Grip III M.D.   On: 06/22/2019 19:47        Scheduled Meds: . ALPRAZolam  1 mg Oral TID  . [START ON 06/24/2019] vitamin C  3,000 mg Oral Daily  . cholecalciferol  1,000 Units Oral  Daily  . DULoxetine  20 mg Oral TID  . enoxaparin (LOVENOX) injection  40 mg Subcutaneous Q12H  . furosemide  40 mg Oral Daily  . insulin aspart  0-20 Units Subcutaneous TID WC  . insulin aspart  0-5 Units Subcutaneous QHS  . insulin aspart  8 Units Subcutaneous TID WC  . insulin glargine  30 Units Subcutaneous Daily  . loratadine  10 mg Oral Daily  . losartan  100 mg Oral Daily  . montelukast  10 mg Oral QHS  . olopatadine  1 drop Both Eyes BID  . pantoprazole  40 mg Oral Daily  . pregabalin  600 mg Oral QHS  . traZODone  150 mg Oral QHS   Continuous Infusions:   LOS: 0 days    Time spent: 25 minutes    Kalesha Irving, MD [facility]   To contact the attending provider between 7A-7P or the covering provider during after hours 7P-7A, please log into the web site [email or web address] and access using universal [place]  password for that web site. If you do not have the password, please call the hospital operator.  06/23/2019, 2:53 PM

## 2019-06-23 NOTE — Progress Notes (Signed)
[facility] called to order hinged brace for left knee.

## 2019-06-23 NOTE — Evaluation (Addendum)
Physical Therapy Evaluation Patient Details Name: Tara Levine MRN: [id number] DOB: Nov 14, 1955 Today's Date: 06/23/2019   History of Present Illness  Pt is a 64 y.o. female presenting to hospital 4/27 s/p tripping outside landing onto L knee.  Imaging showing L knee 1.2 cm osteochondral fx inferior medial patellar facet; nondisplaced posterior medial meniscal tear; and interstitial anterior ACL tear (however there are intact fibers seen throughout).  PMH includes CML, depression, DM, leukemia, panic disorder, progressive systemic sclerosis, OSA, COPD, CHF, fibromyalgia, back and neck surgery, CTR.  Clinical Impression  Prior to hospital admission, pt was ambulatory; lives with her husband (pt reports her husband is in hospice and she is his caregiver).  Currently pt is min assist semi-supine to sitting edge of bed; min assist to stand from bed; and CGA to min assist to ambulate 12 feet with RW (limited distance ambulating d/t L knee pain).  Pt requiring vc's for walker use and overall gait technique during session.  Pt appearing impulsive at times complicated d/t significant L knee pain with movement/activity.  Nurse reports pt was found in bathroom (walked to bathroom) on her own (without walker) prior to receiving L hinged knee brace (pt reports she could stand easier before getting knee brace).  Pt would benefit from skilled PT to address noted impairments and functional limitations (see below for any additional details).  Upon hospital discharge, pt would benefit from STR.    Follow Up Recommendations SNF    Equipment Recommendations  Rolling walker with 5" wheels;3in1 (PT)    Recommendations for Other Services OT consult     Precautions / Restrictions Precautions Precautions: Fall Precaution Comments: Left knee brace. Hinged postoperative knee brace with the hinges set at 0 to 90 degrees but locked in extension for ambulation.  Weight bearing as tolerated, advanced slowly. Required Braces  or Orthoses: Knee Immobilizer - Left Knee Immobilizer - Left: On at all times Restrictions Weight Bearing Restrictions: Yes LLE Weight Bearing: Weight bearing as tolerated Other Position/Activity Restrictions: WBAT with hinged knee brace locked in extension for ambulation (see precautions above).  "Recommends conservative management with posterior knee brace for 6-12 weeks with gradual increase in weightbearing status."      Mobility  Bed Mobility Overal bed mobility: Needs Assistance Bed Mobility: Supine to Sit     Supine to sit: Min assist;HOB elevated     General bed mobility comments: assist for trunk; increased effort to perform (pt unable to get out of bed on L side by herself but was able to get out of bed on R side with minimal assistance)  Transfers Overall transfer level: Needs assistance Equipment used: Rolling walker (2 wheeled) Transfers: Sit to/from Stand Sit to Stand: Min assist;From elevated surface         General transfer comment: assist to initiate stand up to RW; vc's for UE/LE placement and overall technique  Ambulation/Gait Ambulation/Gait assistance: Min guard;Min assist Gait Distance (Feet): 12 Feet Assistive device: Rolling walker (2 wheeled)   Gait velocity: decreased   General Gait Details: vc's to stay closer to RW, to increase UE support through RW to offweight L LE; and overall gait technique  Stairs            Wheelchair Mobility    Modified Rankin (Stroke Patients Only)       Balance Overall balance assessment: Needs assistance Sitting-balance support: Single extremity supported;Feet supported Sitting balance-Leahy Scale: Good Sitting balance - Comments: steady sitting reaching within BOS   Standing balance support: Bilateral  upper extremity supported;During functional activity Standing balance-Leahy Scale: Fair Standing balance comment: assist intermittently for safety ambulating with RW                              Pertinent Vitals/Pain Pain Assessment: 0-10 Pain Score: 8  Pain Location: L knee Pain Descriptors / Indicators: Aching;Tender;Sore Pain Intervention(s): Limited activity within patient's tolerance;Monitored during session;Repositioned;RN gave pain meds during session  Vitals (HR and O2 on room air) stable and WFL throughout treatment session.    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     Hand Dominance        Extremity/Trunk Assessment   Upper Extremity Assessment Upper Extremity Assessment: Overall WFL for tasks assessed    Lower Extremity Assessment Lower Extremity Assessment: LLE deficits/detail LLE Deficits / Details: L LE knee locked in extension; pt unable to perform AROM ankle DF/PF or wiggle toes when cued but some movement at ankle/foot noted with mobility (question if related to knee pain; nurse notified and reported she would check on pt) LLE: Unable to fully assess due to immobilization    Cervical / Trunk Assessment Cervical / Trunk Assessment: (forward shoulders/head)  Communication   Communication: No difficulties  Cognition Arousal/Alertness: Awake/alert Behavior During Therapy: Anxious Overall Cognitive Status: Within Functional Limits for tasks assessed                                        General Comments General comments (skin integrity, edema, etc.): abrasions noted to pt's L knee.  Nursing cleared pt for participation in physical therapy.  Pt agreeable to PT session.    Exercises  Brace  education; transfer and gait training   Assessment/Plan    PT Assessment Patient needs continued PT services  PT Problem List Decreased strength;Decreased activity tolerance;Decreased balance;Decreased mobility;Decreased knowledge of use of DME;Decreased knowledge of precautions;Decreased safety awareness;Pain;Decreased skin integrity       PT Treatment Interventions DME instruction;Gait training;Stair training;Functional mobility training;Therapeutic activities;Therapeutic exercise;Balance training;Patient/family education    PT Goals (Current goals can be found in the Care Plan section)  Acute Rehab PT Goals Patient Stated Goal: to improve pain PT Goal Formulation: With patient Time For Goal Achievement: 07/07/19 Potential to Achieve Goals: Fair    Frequency 7X/week   Barriers to discharge Decreased caregiver support      Co-evaluation               AM-PAC PT "6 Clicks" Mobility  Outcome Measure Help needed turning from your back to your side while in a flat bed without using bedrails?: A Little Help needed moving from lying on your back to sitting on the side of a flat bed without using bedrails?: A Little Help needed moving to and from a bed to a chair (including a wheelchair)?: A Little Help needed standing up from a chair using your arms (e.g., wheelchair or bedside chair)?: A Little Help needed to walk in hospital  room?: A Little Help needed climbing 3-5 steps with a railing? : A Lot 6 Click Score: 17    End of Session Equipment Utilized During Treatment: Gait belt;Other (comment)(hinged knee brace) Activity Tolerance: Patient limited by pain Patient left: in chair;with call bell/phone within reach;with chair alarm set;with family/visitor present;Other (comment)(pt refused to elevate B LE's via pillow and offload heels in recliner) Nurse Communication: Mobility status;Precautions;Weight bearing status;Other (comment)(Pt's pain status) PT Visit Diagnosis: Other  abnormalities of gait and mobility (R26.89);Muscle weakness (generalized) (M62.81);History of falling (Z91.81);Difficulty in walking, not elsewhere classified (R26.2);Pain Pain - Right/Left: Left Pain - part of body: Knee    Time: 1430-1500 PT Time Calculation (min) (ACUTE ONLY): 30 min   Charges:   PT Evaluation $PT Eval Low Complexity: 1 Low PT Treatments $Therapeutic Activity: 8-22 mins       Leitha Bleak, PT 06/23/19, 5:04 PM

## 2019-06-24 DIAGNOSIS — I952 Hypotension due to drugs: Secondary | ICD-10-CM

## 2019-06-24 DIAGNOSIS — R400 Somnolence: Secondary | ICD-10-CM

## 2019-06-24 LAB — GLUCOSE, CAPILLARY
Glucose-Capillary: 316 mg/dL — ABNORMAL HIGH (ref 70–99)
Glucose-Capillary: 364 mg/dL — ABNORMAL HIGH (ref 70–99)
Glucose-Capillary: 149 mg/dL — ABNORMAL HIGH (ref 70–99)
Glucose-Capillary: 313 mg/dL — ABNORMAL HIGH (ref 70–99)

## 2019-06-24 MED ORDER — INSULIN GLARGINE 100 UNIT/ML ~~LOC~~ SOLN
40.0000 [IU] | Freq: Every day | SUBCUTANEOUS | Status: DC
  Administered 2019-06-24 – 2019-06-25 (×2): 40 [IU] via SUBCUTANEOUS
  Filled 2019-06-24 (×2): qty 0.4

## 2019-06-24 MED ORDER — KETOROLAC TROMETHAMINE 15 MG/ML IJ SOLN
15.0000 mg | Freq: Four times a day (QID) | INTRAMUSCULAR | Status: DC | PRN
  Administered 2019-06-24 – 2019-06-25 (×2): 15 mg via INTRAVENOUS
  Filled 2019-06-24 (×2): qty 1

## 2019-06-24 MED ORDER — INSULIN ASPART 100 UNIT/ML ~~LOC~~ SOLN
15.0000 [IU] | Freq: Three times a day (TID) | SUBCUTANEOUS | Status: DC
  Administered 2019-06-24 – 2019-06-25 (×5): 15 [IU] via SUBCUTANEOUS
  Filled 2019-06-24 (×5): qty 1

## 2019-06-24 NOTE — Progress Notes (Signed)
Nutrition Brief Note  Noted patient did not end up discharging yesterday and was changed to inpatient status. RD able to bring "Carbohydrate Counting for People with Diabetes" handout to patient and review with her at bedside today. Patient was not very engaged and education and kept reporting she couldn't see even with glasses on. Discussed importance of following carbohydrate modified diet to help improve glycemic control. Expect fair compliance.  Jacklynn Barnacle, MS, RD, LDN Pager number available on Amion

## 2019-06-24 NOTE — Progress Notes (Signed)
   06/24/19 0739  Assess: MEWS Score  Temp 99.5 F (37.5 C)  BP (!) 89/54  Pulse Rate (!) 102  Resp 18  SpO2 94 %  O2 Device Room Air  Assess: MEWS Score  MEWS Temp 0  MEWS Systolic 1  MEWS Pulse 1  MEWS RR 0  MEWS LOC 0  MEWS Score 2  MEWS Score Color Yellow  Assess: if the MEWS score is Yellow or Red  Were vital signs taken at a resting state? Yes  Focused Assessment Documented focused assessment  Early Detection of Sepsis Score *See Row Information* Low  MEWS guidelines implemented *See Row Information* No, vital signs rechecked  Treat  MEWS Interventions Escalated (See documentation below)  Take Vital Signs  Increase Vital Sign Frequency  Yellow: Q 2hr X 2 then Q 4hr X 2, if remains yellow, continue Q 4hrs  Escalate  MEWS: Escalate Yellow: discuss with charge nurse/RN and consider discussing with provider and RRT  Notify: Charge Nurse/RN  Name of Charge Nurse/RN Notified Allen Kell RN  Date Charge Nurse/RN Notified 06/24/19  Time Charge Nurse/RN Notified 0745  Notify: Provider  Provider Name/Title [place]  Date Provider Notified 06/24/19  Time Provider Notified 0745  Notification Type Page  Notification Reason Change in status  Response No new orders  At 0837 vital signs rechecked and patient is now [place].

## 2019-06-24 NOTE — Progress Notes (Signed)
PROGRESS NOTE    Tara Levine  [id number] DOB: 07-26-1955 DOA: 06/22/2019 PCP: Kerri Perches, PA-C    Chief Complaint  Patient presents with  . Fall    Brief Narrative:  64 year old morbidly obese female with history of diabetes mellitus, hypertension, COPD, severe anxiety with depression, chronic diastolic CHF presented to the ED after a mechanical fall landing onto the left knee.  Denies any dizziness, lightheadedness, hitting her head or loss of consciousness.  Did not sustain any other injuries. Trauma work-up done in the ED which included MRI of the left knee showed 1.2 cm osteochondral fracture seen at the inferior medial patellar facet without displaced osteochondral fragment.  Large heterogeneous anterior medial prepatellar soft tissue hematoma with extensive subcutaneous edema.  Nondisplaced posterior medial meniscal tear, interstitial anterior ACL tear and small knee joint effusion.    Assessment & Plan:   Principal Problem: Mechanical fall with closed displaced left patella fracture Left knee injury with meniscal tear and intractable pain MRI findings as above.  Pain control with oxycodone.  Discontinued IV Dilaudid as it is making her increasingly somnolent.  We will add Toradol.  (Listed as allergy to NSAIDs causing rash, will administer with close monitoring).  Add bowel regimen.  Discussed with orthopedics consult Dr. Roland Rack (official consult not done).  Recommends conservative management with posterior knee brace for 6-12 weeks with gradual increase in weightbearing status.  Follow-up in the office in 2 weeks. Plan on discharging home with home health once pain better controlled and patient not drowsy on narcotics and Benadryl.  Active Problems: Uncontrolled type 2 diabetes mellitus with hyperglycemia, on chronic use of Lantus Patient on dose Lantus and Premeal aspart at home.  CBG further elevated, Lantus dose increased to 40 units daily and Premeal aspart to 15 units  3 times daily.  Somnolence Likely triggered by Dilaudid and Benadryl.  Also noted to have low blood pressure this morning.  Will discontinue both and maintain on oxycodone and Toradol.  Severe anxiety and depression Resumed home dose Xanax.  Resume Cymbalta, trazodone and Lyrica.  Hypotension Possibly due to narcotic use.  Have held losartan.  Continue Lasix and monitor.  COPD with chronic bronchitis Stable.  Continue albuterol nebs as needed and Singulair.  Diastolic CHF ([place]) Euvolemic.  Continue Lasix.  Morbid obesity (BMI 43 kg/m)   DVT prophylaxis: Subcu Lovenox Code Status: Full code Family Communication: Husband involved in care Disposition:   Status is: Inpatient  Remains inpatient appropriate because:Ongoing active pain requiring inpatient pain management Patient unsafe to be discharged home due to uncontrolled pain and increased somnolence and hypotension.  Dispo: The patient is from: Home              Anticipated d/c is to: Home              Anticipated d/c date is: 1 day              Patient currently is not medically stable to d/c.            Procedures: MRI left knee,   Antimicrobials: None  Subjective: Seen and examined.  Noted to have low blood pressure (systolic in the [id number]).  Reports being increasingly sleepy since yesterday and pain in her left leg.  Objective: Vitals:   06/23/19 1717 06/23/19 2319 06/24/19 0739 06/24/19 0837  BP: (!) 92/55 107/60 (!) 89/54 113/61  Pulse: 74 91 (!) 102 92  Resp: 16 18 18 19   Temp: 97.9 F (36.6 C) 98.9  F (37.2 C) 99.5 F (37.5 C) 98.5 F (36.9 C)  TempSrc: Oral Oral Oral Oral  SpO2: 100% 93% 94% 91%  Weight:      Height:        Intake/Output Summary (Last 24 hours) at 06/24/2019 1358 Last data filed at 06/24/2019 1018 Gross per 24 hour  Intake 360 ml  Output --  Net 360 ml   Filed Weights   06/22/19 1908  Weight: 127 kg   Physical exam Morbidly obese female not in distress,  anxious HEENT: Moist mucosa, supple neck Chest: Clear CVS: Normal S1-S2 GI: Soft, nondistended, nontender Musculoskeletal: Swollen left knee with limited ROM    Data Reviewed: I have personally reviewed following labs and imaging studies  CBC: Recent Labs  Lab 06/22/19 2151 06/23/19 0541  WBC 5.3 6.9  NEUTROABS 3.8  --   HGB 12.5 12.2  HCT 36.8 36.1  MCV 84.6 84.7  PLT 242 [id number]    Basic Metabolic Panel: Recent Labs  Lab 06/22/19 2151  NA 135  K 4.4  CL 99  CO2 26  GLUCOSE 368*  BUN 22  CREATININE 0.74  CALCIUM 9.0    GFR: Estimated Creatinine Clearance: 98.5 mL/min (by C-G formula based on SCr of 0.74 mg/dL).  Liver Function Tests: Recent Labs  Lab 06/22/19 2151  AST 18  ALT 18  ALKPHOS 64  BILITOT 0.7  PROT 6.2*  ALBUMIN 3.2*    CBG: Recent Labs  Lab 06/23/19 1147 06/23/19 1710 06/23/19 2044 06/24/19 0738 06/24/19 1252  GLUCAP 283* 312* 126* 316* 364*     Recent Results (from the past 240 hour(s))  Respiratory Panel by RT PCR (Flu A&B, Covid) - Nasopharyngeal Swab     Status: None   Collection Time: 06/23/19  3:38 AM   Specimen: Nasopharyngeal Swab  Result Value Ref Range Status   SARS Coronavirus 2 by RT PCR NEGATIVE NEGATIVE Final    Comment: (NOTE) SARS-CoV-2 target nucleic acids are NOT DETECTED. The SARS-CoV-2 RNA is generally detectable in upper respiratoy specimens during the acute phase of infection. The lowest concentration of SARS-CoV-2 viral copies this assay can detect is 131 copies/mL. A negative result does not preclude SARS-Cov-2 infection and should not be used as the sole basis for treatment or other patient management decisions. A negative result may occur with  improper specimen collection/handling, submission of specimen other than nasopharyngeal swab, presence of viral mutation(s) within the areas targeted by this assay, and inadequate number of viral copies (<131 copies/mL). A negative result must be combined with  clinical observations, patient history, and epidemiological information. The expected result is Negative. Fact Sheet for Patients:  PinkCheek.be Fact Sheet for Healthcare Providers:  GravelBags.it This test is not yet ap proved or cleared by the [place] FDA and  has been authorized for detection and/or diagnosis of SARS-CoV-2 by FDA under an Emergency Use Authorization (EUA). This EUA will remain  in effect (meaning this test can be used) for the duration of the COVID-19 declaration under Section 564(b)(1) of the Act, 21 U.S.C. section 360bbb-3(b)(1), unless the authorization is terminated or revoked sooner.    Influenza A by PCR NEGATIVE NEGATIVE Final   Influenza B by PCR NEGATIVE NEGATIVE Final    Comment: (NOTE) The Xpert Xpress SARS-CoV-2/FLU/RSV assay is intended as an aid in  the diagnosis of influenza from Nasopharyngeal swab specimens and  should not be used as a sole basis for treatment. Nasal washings and  aspirates are unacceptable for Xpert Xpress  SARS-CoV-2/FLU/RSV  testing. Fact Sheet for Patients: PinkCheek.be Fact Sheet for Healthcare Providers: GravelBags.it This test is not yet approved or cleared by the [place] FDA and  has been authorized for detection and/or diagnosis of SARS-CoV-2 by  FDA under an Emergency Use Authorization (EUA). This EUA will remain  in effect (meaning this test can be used) for the duration of the  Covid-19 declaration under Section 564(b)(1) of the Act, 21  U.S.C. section 360bbb-3(b)(1), unless the authorization is  terminated or revoked. Performed at [facility], [address] [place], [place] [address]          Radiology Studies: MR KNEE LEFT WO CONTRAST  Result Date: 06/22/2019 CLINICAL DATA:  Knee instability, tripped and fell directly on knee EXAM: MRI OF THE LEFT KNEE WITHOUT CONTRAST  TECHNIQUE: Multiplanar, multisequence MR imaging of the knee was performed. No intravenous contrast was administered. COMPARISON:  June 22, 2019 FINDINGS: MENISCI Medial: There is a nondisplaced horizontal longitudinal tear seen of the posterior horn of the medial meniscus extending to the mid body. Lateral: Intact. LIGAMENTS Cruciates: There is a tiny interstitial tear seen at the anterior ACL at the femoral insertion site. However there is intact fibers seen throughout. The PCL is intact. Collaterals: The MCL is intact. The lateral collateral ligamentous complex is intact. CARTILAGE Patellofemoral: As described below there is an osteochondral injury at the medial patellar facet. Medial compartment: Mild chondral fissuring seen the weight-bearing surface of the medial femoral condyle. Lateral compartment: Mild chondral fissuring seen the weight-bearing surface of lateral femoral condyle. BONES: There is an focal osteochondral fracture seen at the inferior medial patellar facet measuring 1.2 cm in length. No definite displaced osteochondral fragment however is seen. JOINT: There is a small knee joint effusion with scattered debris. EXTENSOR MECHANISM: The patellar and quadriceps tendon are intact. The retinaculum is unremarkable. POPLITEAL FOSSA: No popliteal cyst. OTHER: There is a large heterogeneous multi lobular 10.6 x 3.1 cm soft tissue hematoma seen overlying the anteromedial prepatellar knee which extends to the proximal tibia. There is extensive overlying subcutaneous edema seen. IMPRESSION: 1. 1.2 cm osteochondral fracture seen at the inferior medial patellar facet without a displaced osteochondral fragment. 2. Large heterogeneous anteromedial prepatellar soft tissue hematoma with extensive subcutaneous edema. 3. Nondisplaced posterior medial meniscal tear. 4. Interstitial anterior ACL tear, however there are intact fibers seen throughout. 5. Small knee joint effusion. Electronically Signed   By: Prudencio Pair M.D.   On: 06/22/2019 23:22   DG Knee Complete 4 Views Left  Result Date: 06/22/2019 CLINICAL DATA:  Pain following fall EXAM: LEFT KNEE - COMPLETE 4+ VIEW COMPARISON:  None. FINDINGS: Frontal, lateral, and bilateral oblique views were obtained. There is no evident acute fracture or dislocation. There is marked soft tissue swelling in the prepatellar region with probable soft tissue hematoma in this area. There is a small joint effusion. No joint space narrowing or erosion. IMPRESSION: Marked soft tissue swelling in the prepatellar region with probable hematoma throughout this area. Small joint effusion evident. No fracture or dislocation. No joint space narrowing or erosion. Electronically Signed   By: Lowella Grip III M.D.   On: 06/22/2019 19:46   DG Hip Unilat W or Wo Pelvis 2-3 Views Left  Result Date: 06/22/2019 CLINICAL DATA:  Pain following fall EXAM: DG HIP (WITH OR WITHOUT PELVIS) 2-3V LEFT COMPARISON:  Left hip radiographs January 21, 2013 FINDINGS: Frontal pelvis as well as frontal and lateral left hip images were obtained. No fracture  or dislocation. Joint spaces appear unremarkable. No erosive change. There is postoperative change in the visualized lower lumbar spine. IMPRESSION: No fracture or dislocation. No appreciable arthropathic change. Postoperative change in visualized lumbar spine. Electronically Signed   By: Lowella Grip III M.D.   On: 06/22/2019 19:47        Scheduled Meds: . ALPRAZolam  1 mg Oral TID  . vitamin C  3,000 mg Oral Daily  . cholecalciferol  1,000 Units Oral Daily  . DULoxetine  20 mg Oral TID  . enoxaparin (LOVENOX) injection  40 mg Subcutaneous Q12H  . furosemide  40 mg Oral Daily  . insulin aspart  0-20 Units Subcutaneous TID WC  . insulin aspart  0-5 Units Subcutaneous QHS  . insulin aspart  15 Units Subcutaneous TID WC  . insulin glargine  40 Units Subcutaneous Daily  . loratadine  10 mg Oral Daily  . montelukast  10 mg Oral QHS   . olopatadine  1 drop Both Eyes BID  . pantoprazole  40 mg Oral Daily  . pregabalin  600 mg Oral QHS  . traZODone  150 mg Oral QHS   Continuous Infusions:   LOS: 1 day    Time spent: 25 minutes    Patricio Popwell, MD [facility]   To contact the attending provider between 7A-7P or the covering provider during after hours 7P-7A, please log into the web site [email or web address] and access using universal [place] password for that web site. If you do not have the password, please call the hospital operator.  06/24/2019, 1:58 PM

## 2019-06-24 NOTE — Progress Notes (Signed)
PT Cancellation Note  Patient Details Name: Tara Levine MRN: [id number] DOB: 1956-02-02   Cancelled Treatment:    Reason Eval/Treat Not Completed: Pain limiting ability to participate   Pt in bed, upon entering room and introducing myself she began shaking her head no stating she was in too much pain.  Stated she is unable to wear knee brace (ordered on for gait locked in extension).  She also c/o pain lateral L ankle.  Jumps and guards ankle at attempts to palpate but as I was leaving room she was rubbing area with her other foot and it did not appear tender to touch.  She refused all attempts at mobility or supine ex.  Discussed with RN.   Chesley Noon 06/24/2019, 12:38 PM

## 2019-06-25 DIAGNOSIS — Z794 Long term (current) use of insulin: Secondary | ICD-10-CM

## 2019-06-25 DIAGNOSIS — E1165 Type 2 diabetes mellitus with hyperglycemia: Secondary | ICD-10-CM

## 2019-06-25 DIAGNOSIS — M25562 Pain in left knee: Secondary | ICD-10-CM

## 2019-06-25 LAB — GLUCOSE, CAPILLARY
Glucose-Capillary: 304 mg/dL — ABNORMAL HIGH (ref 70–99)
Glucose-Capillary: 298 mg/dL — ABNORMAL HIGH (ref 70–99)

## 2019-06-25 MED ORDER — PREGABALIN 75 MG PO CAPS
300.0000 mg | ORAL_CAPSULE | Freq: Two times a day (BID) | ORAL | Status: DC
  Administered 2019-06-25: 300 mg via ORAL
  Filled 2019-06-25: qty 4

## 2019-06-25 MED ORDER — LOSARTAN POTASSIUM 100 MG PO TABS: 100.0000 mg | ORAL_TABLET | Freq: Every day | ORAL | 0 refills | Status: DC

## 2019-06-25 MED ORDER — HYDROCODONE-ACETAMINOPHEN 5-325 MG PO TABS: 2.0000 | ORAL_TABLET | Freq: Four times a day (QID) | ORAL | 0 refills | Status: DC | PRN

## 2019-06-25 MED ORDER — POLYETHYLENE GLYCOL 3350 17 G PO PACK: 17.0000 g | PACK | Freq: Every day | ORAL | 0 refills | Status: AC | PRN

## 2019-06-25 MED ORDER — OXYCODONE-ACETAMINOPHEN 5-325 MG PO TABS: 2.0000 | ORAL_TABLET | Freq: Four times a day (QID) | ORAL | 0 refills | Status: AC | PRN

## 2019-06-25 NOTE — Progress Notes (Signed)
Inpatient Diabetes Program Recommendations  AACE/ADA: New Consensus Statement on Inpatient Glycemic Control (2015)  Target Ranges:  Prepandial:   less than 140 mg/dL      Peak postprandial:   less than 180 mg/dL (1-2 hours)      Critically ill patients:  140 - 180 mg/dL   Lab Results  Component Value Date   GLUCAP 304 (H) 06/25/2019   HGBA1C 11.1 (H) 06/23/2019    Review of Glycemic Control Results for Tara Levine, Tara Levine (MRN [id number]) as of 06/25/2019 09:49  Ref. Range 06/24/2019 07:38 06/24/2019 12:52 06/24/2019 16:24 06/24/2019 21:07 06/25/2019 07:53  Glucose-Capillary Latest Ref Range: 70 - 99 mg/dL 316 (H) 364 (H) 149 (H) 313 (H) 304 (H)   Diabetes history: DM2  Outpatient Diabetes medications: Lantus 20 units daily + Novolog 4-8 TID  Current orders for Inpatient glycemic control: Lantus 40 units + Novolog 0-20 units TID + 0-5 QHS + Novolog 15 units TID with meals  Inpatient Diabetes Program Recommendations:     Spoke with patient.  She is in a lot of pain which could be contributing to very elevated CBG's.  She states she has been running higher lately and had an appointment with Dr. Fuller Song on Tuesday.  She would like to transition her to Trulicity as it worked well fer her in the past.  Patient does not have insurance so they are working with the company and trying to get assistance due to income.   Lantus 50 units QD or 25 units BID May consider Novolog Q4H as CBG's are consistently in the 300's  Thank you, Tara Dixon, RN, BSN Diabetes Coordinator Inpatient Diabetes Program [phone] (team pager from 8a-5p)

## 2019-06-25 NOTE — Progress Notes (Signed)
Physical Therapy Treatment Patient Details Name: Tara Levine MRN: [id number] DOB: 09/20/1955 Today's Date: 06/25/2019    History of Present Illness Pt is a 64 y.o. female presenting to hospital 4/27 s/p tripping outside landing onto L knee.  Imaging showing L knee 1.2 cm osteochondral fx inferior medial patellar facet; nondisplaced posterior medial meniscal tear; and interstitial anterior ACL tear (however there are intact fibers seen throughout).  PMH includes CML, depression, DM, leukemia, panic disorder, progressive systemic sclerosis, OSA, COPD, CHF, fibromyalgia, back and neck surgery, CTR.    PT Comments    Pt was supine in bed upon arriving. She reports with c/o severe sharp pain but was able to participate in PT session. Pt reports she has not been able to tolerate wearing knee brace however therapist applied brace to pt's leg and pt able to tolerate wearing throughout session. Pt/pt's son educated of having knee brace locked in ext when ambulating. Both state understanding and son states he will assist pt at home.  Pt did demonstrate safe ability to ambulate to/from BR without LOB or unsteadiness. Therapist discussed car transfers and techniques for improving safety with mobility at home environment. Overall pt is progressing well with PT however states she is concerned with DC to home with HHPT to follow. Son in room and states he will make sure she can safely Dc to home. Pt would benefit from SNF at DC however unable to be accepted 2/2 to lack of insurance. She will benefit from continued skilled PT to address deficits with strength, balance, and safe functional mobility.       Follow Up Recommendations  SNF     Equipment Recommendations  Rolling walker with 5" wheels;3in1 (PT)    Recommendations for Other Services       Precautions / Restrictions Precautions Precautions: Fall Precaution Comments: Left knee brace. Hinged postoperative knee brace with the hinges set at 0 to 90  degrees but locked in extension for ambulation.  Weight bearing as tolerated, advanced slowly. Restrictions Weight Bearing Restrictions: Yes LLE Weight Bearing: Weight bearing as tolerated    Mobility  Bed Mobility Overal bed mobility: Needs Assistance Bed Mobility: Supine to Sit     Supine to sit: Supervision;HOB elevated Sit to supine: Supervision   General bed mobility comments: Pt was able to exit and re-enter bed without physical assist however does require constant vcs for technique and sequencing improvements.  Transfers Overall transfer level: Needs assistance Equipment used: Rolling walker (2 wheeled) Transfers: Sit to/from Stand Sit to Stand: From elevated surface;Min guard         General transfer comment: CGA for safety  Ambulation/Gait Ambulation/Gait assistance: Min guard Gait Distance (Feet): 30 Feet Assistive device: Rolling walker (2 wheeled) Gait Pattern/deviations: Step-through pattern Gait velocity: decreased   General Gait Details: Pt was able to ambulate to BR and return with CGA for safety only. knee brace was locked in ext during ambulation but pt requires constant assistance and vcs for proper brace use and need to continue to wear for at least 6 weeks but may be 12 weeks. Pt son arrivied and states he will assist her with making sure she wears it.   Stairs             Wheelchair Mobility    Modified Rankin (Stroke Patients Only)       Balance Overall balance assessment: Needs assistance Sitting-balance support: Feet supported Sitting balance-Leahy Scale: Good Sitting balance - Comments: steady sitting reaching within BOS   Standing  balance support: Bilateral upper extremity supported;During functional activity Standing balance-Leahy Scale: Good Standing balance comment: no LOB with BUE support on RW                            Cognition Arousal/Alertness: Awake/alert Behavior During Therapy: WFL for tasks  assessed/performed Overall Cognitive Status: Within Functional Limits for tasks assessed                                 General Comments: Pt is alert and oriented, is cooperative with encouragement and increased time.      Exercises      General Comments        Pertinent Vitals/Pain Pain Assessment: 0-10 Pain Score: 7  Pain Location: L knee L foot lateral ankle Pain Descriptors / Indicators: Aching;Tender;Sore Pain Intervention(s): Limited activity within patient's tolerance;Monitored during session;Premedicated before session;Repositioned    Home Living                      Prior Function            PT Goals (current goals can now be found in the care plan section) Acute Rehab PT Goals Patient Stated Goal: to improve pain Progress towards PT goals: Progressing toward goals    Frequency    7X/week      PT Plan Current plan remains appropriate    Co-evaluation              AM-PAC PT "6 Clicks" Mobility   Outcome Measure  Help needed turning from your back to your side while in a flat bed without using bedrails?: A Little Help needed moving from lying on your back to sitting on the side of a flat bed without using bedrails?: A Little Help needed moving to and from a bed to a chair (including a wheelchair)?: A Little Help needed standing up from a chair using your arms (e.g., wheelchair or bedside chair)?: A Little Help needed to walk in hospital room?: A Little Help needed climbing 3-5 steps with a railing? : A Little 6 Click Score: 18    End of Session Equipment Utilized During Treatment: Gait belt Activity Tolerance: Patient tolerated treatment well;Patient limited by pain Patient left: in bed;with bed alarm set;with call bell/phone within reach;with family/visitor present Nurse Communication: Mobility status;Precautions;Weight bearing status;Other (comment) PT Visit Diagnosis: Other abnormalities of gait and mobility  (R26.89);Muscle weakness (generalized) (M62.81);History of falling (Z91.81);Difficulty in walking, not elsewhere classified (R26.2);Pain Pain - Right/Left: Left Pain - part of body: Knee     Time: 1208-1238 PT Time Calculation (min) (ACUTE ONLY): 30 min  Charges:  $Gait Training: 8-22 mins $Therapeutic Activity: 8-22 mins                     Julaine Fusi PTA 06/25/19, 1:50 PM

## 2019-06-25 NOTE — Discharge Summary (Signed)
Physician Discharge Summary  Tara Levine [id number] DOB: 1955-09-11 DOA: 06/22/2019  PCP: Kerri Perches, PA-C  Admit date: 06/22/2019 Discharge date: 06/25/2019  Admitted From: Home Disposition: Home  Recommendations for Outpatient Follow-up:  1. Follow up with PCP in 1 week 2. Follow-up with orthopedics (Dr. Roland Rack)   [place]: PT/OT Equipment/Devices: Rolling walker, left knee brace  Discharge Condition: Fair CODE STATUS: Full code Diet recommendation: Heart Healthy / Carb Modified     Discharge Diagnoses:  Principal Problem:   Closed displaced osteochondral fracture of left patella   Active Problems:   Left anterior knee pain   Uncontrolled type 2 diabetes mellitus with hyperglycemia, with long-term current use of insulin (HCC) Essential HTN (hypertension)   Chronic diastolic CHF (congestive heart failure) (HCC)   OSA (obstructive sleep apnea)   Accidental fall   Obesity, Class III, BMI 40-49.9 (morbid obesity) (HCC)   Type 2 diabetes mellitus without complication (HCC)   COPD with chronic bronchitis (HCC)   Anxiety   Brief narrative/HPI 64 year old morbidly obese female with history of diabetes mellitus, hypertension, COPD, severe anxiety with depression, chronic diastolic CHF presented to the ED after a mechanical fall landing onto the left knee.  Denies any dizziness, lightheadedness, hitting her head or loss of consciousness.  Did not sustain any other injuries. Trauma work-up done in the ED which included MRI of the left knee showed 1.2 cm osteochondral fracture seen at the inferior medial patellar facet without displaced osteochondral fragment.  Large heterogeneous anterior medial prepatellar soft tissue hematoma with extensive subcutaneous edema.  Nondisplaced posterior medial meniscal tear, interstitial anterior ACL tear and small knee joint effusion.   Hospital course    Principal Problem: Mechanical fall with closed displaced left patella  fracture Left knee injury with meniscal tear and intractable pain MRI findings as above.  Pain control with oxycodone as needed.  Discontinued IV Dilaudid as it is making her increasingly somnolent.      Added bowel regimen. Discussed with orthopedics consult Dr. Roland Rack (official consult not done).  Recommends conservative management with posterior knee brace for 6-12 weeks with gradual increase in weightbearing status.  Follow-up in the office in 2 weeks. Plan on discharging home on Percocet as needed along with home health.  Rolling walker and brace provided.    Active Problems: Uncontrolled type 2 diabetes mellitus with hyperglycemia, on chronic use of Lantus Patient on dose Lantus and Premeal aspart at home.    Lantus and Premeal aspart was adjusted.  Will resume home insulin dose upon discharge.  Somnolence Likely triggered by Dilaudid and Benadryl.    Also noted for low blood pressure during hospital stay for which both were discontinued  Severe anxiety and depression Continue home meds.  Hypotension Possibly due to narcotic use.    Losartan had to be held.  Now stable off IV Dilaudid and Benadryl.  Resume blood pressure meds upon discharge.  COPD with chronic bronchitis Stable.  Continue albuterol nebs as needed and Singulair.  Diastolic CHF ([place]) Euvolemic.  Continue Lasix.  Morbid obesity (BMI 43 kg/m) Counseled on weight loss and exercise.  Procedure: MRI left knee Consults: None Disposition: Home   Discharge Instructions  Discharge Instructions    Ambulatory referral to Orthopedic Surgery   Complete by: As directed    Needs appt in 2 weeks     Allergies as of 06/25/2019      Reactions   Morphine Rash   Headache too   Codeine Other (See Comments)   Reaction:  Unknown    Latex Other (See Comments)   Reaction:  Unknown    Morphine And Related Other (See Comments)   Reaction:  Unknown    Relafen [nabumetone] Hives   Nsaids Rash   Tape Other (See  Comments), Rash   Reaction:  Unknown       Medication List    STOP taking these medications   bosutinib 100 MG tablet Commonly known as: BOSULIF   celecoxib 200 MG capsule Commonly known as: CELEBREX   cephALEXin 500 MG capsule Commonly known as: KEFLEX   clindamycin 300 MG capsule Commonly known as: CLEOCIN   doxycycline 100 MG tablet Commonly known as: VIBRA-TABS   fluconazole 150 MG tablet Commonly known as: Diflucan   HYDROcodone-acetaminophen 5-325 MG tablet Commonly known as: NORCO/VICODIN   ondansetron 8 MG disintegrating tablet Commonly known as: Zofran ODT   oseltamivir 75 MG capsule Commonly known as: TAMIFLU   predniSONE 20 MG tablet Commonly known as: DELTASONE   spironolactone 25 MG tablet Commonly known as: ALDACTONE     TAKE these medications   Accu-Chek Guide test strip Generic drug: glucose blood TEST ONCE DAILY AND AS DIRECTED.   Accu-Chek Softclix Lancets lancets Use 1 each 3 (three) times daily. Use as instructed.   albuterol 108 (90 Base) MCG/ACT inhaler Commonly known as: VENTOLIN HFA Inhale 2 puffs into the lungs every 6 (six) hours as needed for wheezing or shortness of breath.   albuterol (2.5 MG/3ML) 0.083% nebulizer solution Commonly known as: PROVENTIL INHALE CONTENTS OF 1 VIAL VIA NEBULIZER THREE TIMES A DAY prn   ALPRAZolam 1 MG tablet Commonly known as: XANAX Take 1 tablet (1 mg total) by mouth 3 (three) times daily.   cetirizine 10 MG tablet Commonly known as: ZYRTEC Take 20 mg by mouth daily as needed.   co-enzyme Q-10 50 MG capsule Take 50 mg by mouth daily.   diphenhydrAMINE 25 MG tablet Commonly known as: BENADRYL Take 25 mg by mouth every 6 (six) hours as needed for itching or allergies.   DULoxetine 20 MG capsule Commonly known as: CYMBALTA Take 1 capsule (20 mg total) by mouth 3 (three) times daily.   fluticasone 50 MCG/ACT nasal spray Commonly known as: FLONASE Place 1-2 sprays into both nostrils as  needed for rhinitis.   furosemide 40 MG tablet Commonly known as: LASIX Take 1 tablet (40 mg total) by mouth daily.   GlucoCom Blood Glucose Monitor Devi Use as directed.   insulin aspart 100 UNIT/ML injection Commonly known as: novoLOG Inject 2-8 Units into the skin 3 (three) times daily as needed for high blood sugar. Pt reports she is taking 2-8 units per sliding scale based on glucose   insulin glargine 100 UNIT/ML injection Commonly known as: LANTUS Inject 0.37 mLs (37 Units total) into the skin daily. What changed: how much to take   losartan 100 MG tablet Commonly known as: COZAAR Take 100 mg by mouth daily.   montelukast 10 MG tablet Commonly known as: SINGULAIR Take 1 tablet (10 mg total) by mouth at bedtime.   OMEGA 3 PO Take 3 capsules by mouth daily.   omeprazole 40 MG capsule Commonly known as: PRILOSEC Take 40 mg by mouth daily. Reported on 04/21/2015   oxyCODONE-acetaminophen 5-325 MG tablet Commonly known as: Percocet Take 2 tablets by mouth every 6 (six) hours as needed for severe pain.   Pataday 0.2 % Soln Generic drug: Olopatadine HCl Place 1 drop into both eyes daily.   polyethylene glycol  17 g packet Commonly known as: MiraLax Mix-In Pax Take 17 g by mouth daily as needed.   pregabalin 300 MG capsule Commonly known as: LYRICA TAKE 1 CAPSULE BY MOUTH TWICE A DAY What changed:   how much to take  when to take this  Another medication with the same name was removed. Continue taking this medication, and follow the directions you see here.   traZODone 150 MG tablet Commonly known as: DESYREL Take 1 tablet (150 mg total) by mouth at bedtime.   vitamin B-12 1000 MCG tablet Commonly known as: CYANOCOBALAMIN Take 4,000 mcg by mouth daily.   vitamin C 1000 MG tablet Take 3,000 mg by mouth daily.   VITAMIN D PO Take 1 tablet by mouth daily.      Follow-up Information    Poggi, Marshall Cork, MD. Schedule an appointment as soon as possible for  a visit in 2 weeks.   Specialty: Orthopedic Surgery Why: Office will call Patient w/ Appt Contact information: [address] [place] [address] [phone]        Kerri Perches, PA-C. Schedule an appointment as soon as possible for a visit in 1 week.   Specialty: Physician Assistant Why: Patient to call for Appt Contact information: [address] [place] [address] [phone]          Allergies  Allergen Reactions  . Morphine Rash    Headache too  . Codeine Other (See Comments)    Reaction: Unknown   . Latex Other (See Comments)    Reaction:  Unknown   . Morphine And Related Other (See Comments)    Reaction:  Unknown   . Relafen [Nabumetone] Hives  . Nsaids Rash  . Tape Other (See Comments) and Rash    Reaction:  Unknown     Procedures/Studies: MR KNEE LEFT WO CONTRAST  Result Date: 06/22/2019 CLINICAL DATA:  Knee instability, tripped and fell directly on knee EXAM: MRI OF THE LEFT KNEE WITHOUT CONTRAST TECHNIQUE: Multiplanar, multisequence MR imaging of the knee was performed. No intravenous contrast was administered. COMPARISON:  June 22, 2019 FINDINGS: MENISCI Medial: There is a nondisplaced horizontal longitudinal tear seen of the posterior horn of the medial meniscus extending to the mid body. Lateral: Intact. LIGAMENTS Cruciates: There is a tiny interstitial tear seen at the anterior ACL at the femoral insertion site. However there is intact fibers seen throughout. The PCL is intact. Collaterals: The MCL is intact. The lateral collateral ligamentous complex is intact. CARTILAGE Patellofemoral: As described below there is an osteochondral injury at the medial patellar facet. Medial compartment: Mild chondral fissuring seen the weight-bearing surface of the medial femoral condyle. Lateral compartment: Mild chondral fissuring seen the weight-bearing surface of lateral femoral condyle. BONES: There is an focal osteochondral  fracture seen at the inferior medial patellar facet measuring 1.2 cm in length. No definite displaced osteochondral fragment however is seen. JOINT: There is a small knee joint effusion with scattered debris. EXTENSOR MECHANISM: The patellar and quadriceps tendon are intact. The retinaculum is unremarkable. POPLITEAL FOSSA: No popliteal cyst. OTHER: There is a large heterogeneous multi lobular 10.6 x 3.1 cm soft tissue hematoma seen overlying the anteromedial prepatellar knee which extends to the proximal tibia. There is extensive overlying subcutaneous edema seen. IMPRESSION: 1. 1.2 cm osteochondral fracture seen at the inferior medial patellar facet without a displaced osteochondral fragment. 2. Large heterogeneous anteromedial prepatellar soft tissue hematoma with extensive subcutaneous edema. 3. Nondisplaced posterior medial meniscal  tear. 4. Interstitial anterior ACL tear, however there are intact fibers seen throughout. 5. Small knee joint effusion. Electronically Signed   By: Prudencio Pair M.D.   On: 06/22/2019 23:22   DG Knee Complete 4 Views Left  Result Date: 06/22/2019 CLINICAL DATA:  Pain following fall EXAM: LEFT KNEE - COMPLETE 4+ VIEW COMPARISON:  None. FINDINGS: Frontal, lateral, and bilateral oblique views were obtained. There is no evident acute fracture or dislocation. There is marked soft tissue swelling in the prepatellar region with probable soft tissue hematoma in this area. There is a small joint effusion. No joint space narrowing or erosion. IMPRESSION: Marked soft tissue swelling in the prepatellar region with probable hematoma throughout this area. Small joint effusion evident. No fracture or dislocation. No joint space narrowing or erosion. Electronically Signed   By: Lowella Grip III M.D.   On: 06/22/2019 19:46   DG Hip Unilat W or Wo Pelvis 2-3 Views Left  Result Date: 06/22/2019 CLINICAL DATA:  Pain following fall EXAM: DG HIP (WITH OR WITHOUT PELVIS) 2-3V LEFT COMPARISON:   Left hip radiographs January 21, 2013 FINDINGS: Frontal pelvis as well as frontal and lateral left hip images were obtained. No fracture or dislocation. Joint spaces appear unremarkable. No erosive change. There is postoperative change in the visualized lower lumbar spine. IMPRESSION: No fracture or dislocation. No appreciable arthropathic change. Postoperative change in visualized lumbar spine. Electronically Signed   By: Lowella Grip III M.D.   On: 06/22/2019 19:47      Subjective: Complaining of pain off and on.  Extremely anxious.  No overnight events.  Discharge Exam: Vitals:   06/25/19 0055 06/25/19 0848  BP: (!) 97/57 (!) 135/93  Pulse: 91 (!) 109  Resp: 18 20  Temp: 97.7 F (36.5 C) 98.8 F (37.1 C)  SpO2: 92% 98%   Vitals:   06/24/19 2107 06/24/19 2210 06/25/19 0055 06/25/19 0848  BP: (!) 101/56 119/71 (!) 97/57 (!) 135/93  Pulse: (!) 102 100 91 (!) 109  Resp:   18 20  Temp:   97.7 F (36.5 C) 98.8 F (37.1 C)  TempSrc:   Oral   SpO2:   92% 98%  Weight:      Height:        General: Morbidly obese female not in distress, very anxious HEENT: Moist mucosa, supple neck Chest: Clear CVs: Normal S1-S2 GI: Soft, nondistended, nontender Musculoskeletal: Swelling of the left knee with limited ROM due to pain     The results of significant diagnostics from this hospitalization (including imaging, microbiology, ancillary and laboratory) are listed below for reference.     Microbiology: Recent Results (from the past 240 hour(s))  Respiratory Panel by RT PCR (Flu A&B, Covid) - Nasopharyngeal Swab     Status: None   Collection Time: 06/23/19  3:38 AM   Specimen: Nasopharyngeal Swab  Result Value Ref Range Status   SARS Coronavirus 2 by RT PCR NEGATIVE NEGATIVE Final    Comment: (NOTE) SARS-CoV-2 target nucleic acids are NOT DETECTED. The SARS-CoV-2 RNA is generally detectable in upper respiratoy specimens during the acute phase of infection. The  lowest concentration of SARS-CoV-2 viral copies this assay can detect is 131 copies/mL. A negative result does not preclude SARS-Cov-2 infection and should not be used as the sole basis for treatment or other patient management decisions. A negative result may occur with  improper specimen collection/handling, submission of specimen other than nasopharyngeal swab, presence of viral mutation(s) within the areas targeted by  this assay, and inadequate number of viral copies (<131 copies/mL). A negative result must be combined with clinical observations, patient history, and epidemiological information. The expected result is Negative. Fact Sheet for Patients:  PinkCheek.be Fact Sheet for Healthcare Providers:  GravelBags.it This test is not yet ap proved or cleared by the [place] FDA and  has been authorized for detection and/or diagnosis of SARS-CoV-2 by FDA under an Emergency Use Authorization (EUA). This EUA will remain  in effect (meaning this test can be used) for the duration of the COVID-19 declaration under Section 564(b)(1) of the Act, 21 U.S.C. section 360bbb-3(b)(1), unless the authorization is terminated or revoked sooner.    Influenza A by PCR NEGATIVE NEGATIVE Final   Influenza B by PCR NEGATIVE NEGATIVE Final    Comment: (NOTE) The Xpert Xpress SARS-CoV-2/FLU/RSV assay is intended as an aid in  the diagnosis of influenza from Nasopharyngeal swab specimens and  should not be used as a sole basis for treatment. Nasal washings and  aspirates are unacceptable for Xpert Xpress SARS-CoV-2/FLU/RSV  testing. Fact Sheet for Patients: PinkCheek.be Fact Sheet for Healthcare Providers: GravelBags.it This test is not yet approved or cleared by the [place] FDA and  has been authorized for detection and/or diagnosis of SARS-CoV-2 by  FDA under an Emergency  Use Authorization (EUA). This EUA will remain  in effect (meaning this test can be used) for the duration of the  Covid-19 declaration under Section 564(b)(1) of the Act, 21  U.S.C. section 360bbb-3(b)(1), unless the authorization is  terminated or revoked. Performed at [facility], [address] [place], [place] [address]      Labs: BNP (last 3 results) No results for input(s): BNP in the last 8760 hours. Basic Metabolic Panel: Recent Labs  Lab 06/22/19 2151  NA 135  K 4.4  CL 99  CO2 26  GLUCOSE 368*  BUN 22  CREATININE 0.74  CALCIUM 9.0   Liver Function Tests: Recent Labs  Lab 06/22/19 2151  AST 18  ALT 18  ALKPHOS 64  BILITOT 0.7  PROT 6.2*  ALBUMIN 3.2*   No results for input(s): LIPASE, AMYLASE in the last 168 hours. No results for input(s): AMMONIA in the last 168 hours. CBC: Recent Labs  Lab 06/22/19 2151 06/23/19 0541  WBC 5.3 6.9  NEUTROABS 3.8  --   HGB 12.5 12.2  HCT 36.8 36.1  MCV 84.6 84.7  PLT 242 276   Cardiac Enzymes: No results for input(s): CKTOTAL, CKMB, CKMBINDEX, TROPONINI in the last 168 hours. BNP: Invalid input(s): POCBNP CBG: Recent Labs  Lab 06/24/19 1252 06/24/19 1624 06/24/19 2107 06/25/19 0753 06/25/19 1138  GLUCAP 364* 149* 313* 304* 298*   D-Dimer No results for input(s): DDIMER in the last 72 hours. Hgb A1c Recent Labs    06/23/19 0541  HGBA1C 11.1*   Lipid Profile No results for input(s): CHOL, HDL, LDLCALC, TRIG, CHOLHDL, LDLDIRECT in the last 72 hours. Thyroid function studies No results for input(s): TSH, T4TOTAL, T3FREE, THYROIDAB in the last 72 hours.  Invalid input(s): FREET3 Anemia work up No results for input(s): VITAMINB12, FOLATE, FERRITIN, TIBC, IRON, RETICCTPCT in the last 72 hours. Urinalysis    Component Value Date/Time   COLORURINE STRAW (A) 08/31/2014 1930   APPEARANCEUR CLEAR (A) 08/31/2014 1930   APPEARANCEUR Clear 02/07/2012 1506   LABSPEC 1.005 08/31/2014 1930    LABSPEC 1.006 02/07/2012 1506   PHURINE 9.0 (H) 08/31/2014 [place] NEGATIVE 08/31/2014 1930   GLUCOSEU  Negative 02/07/2012 1506   HGBUR NEGATIVE 08/31/2014 1930   BILIRUBINUR NEGATIVE 08/31/2014 1930   BILIRUBINUR Negative 02/07/2012 [place] 08/31/2014 1930   PROTEINUR NEGATIVE 08/31/2014 1930   NITRITE NEGATIVE 08/31/2014 1930   LEUKOCYTESUR NEGATIVE 08/31/2014 1930   LEUKOCYTESUR Negative 02/07/2012 1506   Sepsis Labs Invalid input(s): PROCALCITONIN,  WBC,  LACTICIDVEN Microbiology Recent Results (from the past 240 hour(s))  Respiratory Panel by RT PCR (Flu A&B, Covid) - Nasopharyngeal Swab     Status: None   Collection Time: 06/23/19  3:38 AM   Specimen: Nasopharyngeal Swab  Result Value Ref Range Status   SARS Coronavirus 2 by RT PCR NEGATIVE NEGATIVE Final    Comment: (NOTE) SARS-CoV-2 target nucleic acids are NOT DETECTED. The SARS-CoV-2 RNA is generally detectable in upper respiratoy specimens during the acute phase of infection. The lowest concentration of SARS-CoV-2 viral copies this assay can detect is 131 copies/mL. A negative result does not preclude SARS-Cov-2 infection and should not be used as the sole basis for treatment or other patient management decisions. A negative result may occur with  improper specimen collection/handling, submission of specimen other than nasopharyngeal swab, presence of viral mutation(s) within the areas targeted by this assay, and inadequate number of viral copies (<131 copies/mL). A negative result must be combined with clinical observations, patient history, and epidemiological information. The expected result is Negative. Fact Sheet for Patients:  PinkCheek.be Fact Sheet for Healthcare Providers:  GravelBags.it This test is not yet ap proved or cleared by the [place] FDA and  has been authorized for detection and/or diagnosis of SARS-CoV-2  by FDA under an Emergency Use Authorization (EUA). This EUA will remain  in effect (meaning this test can be used) for the duration of the COVID-19 declaration under Section 564(b)(1) of the Act, 21 U.S.C. section 360bbb-3(b)(1), unless the authorization is terminated or revoked sooner.    Influenza A by PCR NEGATIVE NEGATIVE Final   Influenza B by PCR NEGATIVE NEGATIVE Final    Comment: (NOTE) The Xpert Xpress SARS-CoV-2/FLU/RSV assay is intended as an aid in  the diagnosis of influenza from Nasopharyngeal swab specimens and  should not be used as a sole basis for treatment. Nasal washings and  aspirates are unacceptable for Xpert Xpress SARS-CoV-2/FLU/RSV  testing. Fact Sheet for Patients: PinkCheek.be Fact Sheet for Healthcare Providers: GravelBags.it This test is not yet approved or cleared by the [place] FDA and  has been authorized for detection and/or diagnosis of SARS-CoV-2 by  FDA under an Emergency Use Authorization (EUA). This EUA will remain  in effect (meaning this test can be used) for the duration of the  Covid-19 declaration under Section 564(b)(1) of the Act, 21  U.S.C. section 360bbb-3(b)(1), unless the authorization is  terminated or revoked. Performed at [facility], [address], [place], [place] [address]      Time coordinating discharge: 35 minutes  SIGNED:   Louellen Molder, MD  [facility] 06/25/2019, 12:35 PM Pager   If 7PM-7AM, please contact night-coverage [email or web address] Password [email or web address]

## 2019-06-25 NOTE — Plan of Care (Signed)
  Problem: Clinical Measurements: Goal: Will remain free from infection Outcome: Progressing   Problem: Coping: Goal: Level of anxiety will decrease Outcome: Progressing   Problem: Pain Managment: Goal: General experience of comfort will improve Outcome: Progressing   

## 2019-06-25 NOTE — Discharge Instructions (Signed)
Minimal weight bearing as tolerated and advance gradually. Apply knee brace  for at least 6-12 weeks . Follow up with orthopedic surgeon in 2 weeks ( call office for appointment)

## 2019-07-13 ENCOUNTER — Encounter: Payer: Self-pay | Admitting: Psychiatry

## 2019-07-13 ENCOUNTER — Other Ambulatory Visit: Payer: Self-pay

## 2019-07-13 ENCOUNTER — Telehealth (INDEPENDENT_AMBULATORY_CARE_PROVIDER_SITE_OTHER): Payer: Self-pay | Admitting: Psychiatry

## 2019-07-13 DIAGNOSIS — F3130 Bipolar disorder, current episode depressed, mild or moderate severity, unspecified: Secondary | ICD-10-CM

## 2019-07-13 DIAGNOSIS — F4310 Post-traumatic stress disorder, unspecified: Secondary | ICD-10-CM

## 2019-07-13 MED ORDER — PREGABALIN 300 MG PO CAPS: 300.0000 mg | ORAL_CAPSULE | Freq: Two times a day (BID) | ORAL | 5 refills | Status: DC

## 2019-07-13 MED ORDER — TRAZODONE HCL 150 MG PO TABS: 150.0000 mg | ORAL_TABLET | Freq: Every day | ORAL | 5 refills | Status: DC

## 2019-07-13 MED ORDER — DULOXETINE HCL 20 MG PO CPEP: 20.0000 mg | ORAL_CAPSULE | Freq: Three times a day (TID) | ORAL | 5 refills | Status: DC

## 2019-07-13 MED ORDER — ALPRAZOLAM 1 MG PO TABS: 1.0000 mg | ORAL_TABLET | Freq: Three times a day (TID) | ORAL | 5 refills | Status: DC

## 2019-07-13 NOTE — Progress Notes (Signed)
Follow-up for this patient with chronic anxiety and depression.  Identified myself and the patient identified herself.  Patient's son died of a heart attack a week and a half ago.  Obviously overwhelmed with grief.  Tearful most of the time.  Sleeping okay but feeling very tired.  Not eating well.  Denies any psychosis however.  Denies suicidal ideation.  Her husband has been in rehab for a few weeks and she also suffered an injury to her knee and is having ongoing pain.  All of this obviously is worsening all of the stress.  Tearful throughout but reactive.  Thoughts lucid no sign of loosening of associations or delusions.  Denied auditory or visual hallucinations.  Denied suicidal thoughts.  Reviewed medication.  No change to Xanax Cymbalta Lyrica trazodone.  Her prescriptions to go to different pharmacies because of price issues and I have tried to make all of that correct.  Supportive counseling and therapy.  Patient knows she can call sooner if needed.  Total time on the phone 20 minutes.  Follow-up 6 months.

## 2019-11-10 ENCOUNTER — Telehealth: Payer: Self-pay

## 2019-11-10 NOTE — Telephone Encounter (Signed)
Received voicemail from patient regarding assistance with medical bills and other things.  Attempted to reach patient x3 to further discuss needs and resources, however unable to reach.  Patient does not have voicemail set up so unable to leave message.

## 2019-11-25 ENCOUNTER — Encounter: Payer: Self-pay | Admitting: Psychiatry

## 2019-11-25 ENCOUNTER — Other Ambulatory Visit: Payer: Self-pay

## 2019-11-25 ENCOUNTER — Telehealth (INDEPENDENT_AMBULATORY_CARE_PROVIDER_SITE_OTHER): Payer: Self-pay | Admitting: Psychiatry

## 2019-11-25 DIAGNOSIS — F4310 Post-traumatic stress disorder, unspecified: Secondary | ICD-10-CM

## 2019-11-25 DIAGNOSIS — F3130 Bipolar disorder, current episode depressed, mild or moderate severity, unspecified: Secondary | ICD-10-CM

## 2019-11-25 MED ORDER — DULOXETINE HCL 20 MG PO CPEP: 20.0000 mg | ORAL_CAPSULE | Freq: Three times a day (TID) | ORAL | 5 refills | Status: DC

## 2019-11-25 MED ORDER — PREGABALIN 300 MG PO CAPS: 300.0000 mg | ORAL_CAPSULE | Freq: Two times a day (BID) | ORAL | 5 refills | Status: DC

## 2019-11-25 MED ORDER — LATUDA 20 MG PO TABS: 20.0000 mg | ORAL_TABLET | Freq: Every day | ORAL | 1 refills | Status: DC

## 2019-11-25 MED ORDER — ALPRAZOLAM 1 MG PO TABS: 1.0000 mg | ORAL_TABLET | Freq: Three times a day (TID) | ORAL | 5 refills | Status: DC

## 2019-11-25 MED ORDER — TRAZODONE HCL 150 MG PO TABS
150.0000 mg | ORAL_TABLET | Freq: Every day | ORAL | 5 refills | Status: DC
Start: 2019-11-25 — End: 2020-02-17

## 2019-11-25 NOTE — Progress Notes (Signed)
This is a follow-up note for this 64 year old woman with chronic mood disorder related to a diagnosis of bipolar disorder and PTSD.  Patient was reached by telephone.  I established each of our identities.  Total time on the telephone 23 minutes.  Patient is feeling very depressed.  Her husband passed away last month.  This is the third close relative to diet in the past year.  The whole experience of caring for him for the past couple years had been traumatic.  She is now feeling sad and down almost all the time.  Energy low.  Little interest in normal activities.  Crying frequently.  Feels overwhelmed and hopeless.  She denies any suicidal thoughts and she denies any psychotic symptoms.  She remains compliant with her current medicines which include alprazolam Cymbalta Lyrica and trazodone.  Patient is alert and oriented and appropriate in her communication.  No indication of loosening of association or delusional or disorganized thinking.  Sad affect obviously.  Feeling depressed.  Hopeless but denies any suicidal thoughts or intent and is focused on wanting to feel better.  Denies hallucinations.  Supportive counseling and therapy.  Spent time expressing empathy and support with her.  Reviewed with her how she will she is taking care of her self now.  The patient makes a specific request to start [place].  She saw a television commercial for this medicine and believes that based on that it would be the best next step in treating her bipolar depression.  I discussed with her that there are several medications that could be used in this situation including Latuda.  Side effects were reviewed.  Patient was warned that this medication is high cost if bought for full price.  Also advised her that it had to be taken with food.  Based on her request I will prescribe Latuda 20 mg/day while continuing her other medicine.  Prescriptions of all been renewed.  I will check our office to see if we have any samples of  this medicine and if we do I can go get them to her.  If ultimately it does not work out with this medicine we will readdress her needs and symptoms in another month.

## 2020-02-15 ENCOUNTER — Telehealth: Payer: Self-pay

## 2020-02-15 NOTE — Telephone Encounter (Signed)
Medication mangement - Telephone call with patient to follow up on message she left she was in need of refills. Patient has an appointment set for 02/17/20 with Dr. Weber Cooks and states she has medications until then.

## 2020-02-17 ENCOUNTER — Telehealth (INDEPENDENT_AMBULATORY_CARE_PROVIDER_SITE_OTHER): Payer: Self-pay | Admitting: Psychiatry

## 2020-02-17 ENCOUNTER — Other Ambulatory Visit: Payer: Self-pay

## 2020-02-17 ENCOUNTER — Encounter: Payer: Self-pay | Admitting: Psychiatry

## 2020-02-17 DIAGNOSIS — F3130 Bipolar disorder, current episode depressed, mild or moderate severity, unspecified: Secondary | ICD-10-CM

## 2020-02-17 DIAGNOSIS — F4310 Post-traumatic stress disorder, unspecified: Secondary | ICD-10-CM

## 2020-02-17 MED ORDER — TRAZODONE HCL 150 MG PO TABS
150.0000 mg | ORAL_TABLET | Freq: Every day | ORAL | 5 refills | Status: DC
Start: 2020-02-17 — End: 2020-05-30

## 2020-02-17 MED ORDER — DULOXETINE HCL 20 MG PO CPEP
20.0000 mg | ORAL_CAPSULE | Freq: Four times a day (QID) | ORAL | 5 refills | Status: DC
Start: 2020-02-17 — End: 2020-05-30

## 2020-02-17 MED ORDER — LATUDA 20 MG PO TABS
20.0000 mg | ORAL_TABLET | Freq: Every day | ORAL | 3 refills | Status: DC
Start: 2020-02-17 — End: 2020-05-30

## 2020-02-17 NOTE — Progress Notes (Signed)
Virtual Visit via Telephone Note  I connected with Tara Levine on 02/17/20 at  1:40 PM EST by telephone and verified that I am speaking with the correct person using two identifiers.  Location: Patient: Home Provider: Hospital   I discussed the limitations, risks, security and privacy concerns of performing an evaluation and management service by telephone and the availability of in person appointments. I also discussed with the patient that there may be a patient responsible charge related to this service. The patient expressed understanding and agreed to proceed.   History of Present Illness: Patient reached by telephone.  She is sad and reports that her mood is been much more down for the last couple months.  Absolutely denies any suicidal ideation but is feeling physically and mentally unwell.  Feels tired all the time.  Frequent perspiration.  She is not taking good care of her health.  Never checks her blood sugars.  Has not been in to see a doctor anytime recently.  Chronic pain continues to be a major issue.  Denies any hallucinations but just feels hopeless and has little motivation to get up and do anything.  Grief is very strong    Observations/Objective: Tearful throughout much of the conversation.  No sign of disorganized thinking.  Lucid throughout.  Denies suicidal ideation denies psychosis.   Assessment and Plan: Patient was requesting that I increase the dose of her Lyrica but she is already at the maximum allowable dose.  I did agree with her request to increase Cymbalta to 20 mg 4 times a day.  She prefers that than trying to go to 40 mg twice a day.  Other medicines the same.  Follow-up in 3 months.  Encourage patient to get in to see a doctor as soon as possible or even come to the ER if she is feeling this bad.   Follow Up Instructions: Follow-up 3 months    I discussed the assessment and treatment plan with the patient. The patient was provided an opportunity to ask  questions and all were answered. The patient agreed with the plan and demonstrated an understanding of the instructions.   The patient was advised to call back or seek an in-person evaluation if the symptoms worsen or if the condition fails to improve as anticipated.  I provided52minutes of non-face-to-face time during this encounter.   Alethia Berthold, MD

## 2020-05-27 ENCOUNTER — Other Ambulatory Visit: Payer: Self-pay | Admitting: Psychiatry

## 2020-05-30 ENCOUNTER — Other Ambulatory Visit: Payer: Self-pay

## 2020-05-30 ENCOUNTER — Other Ambulatory Visit: Payer: Self-pay | Admitting: Psychiatry

## 2020-05-30 ENCOUNTER — Telehealth (INDEPENDENT_AMBULATORY_CARE_PROVIDER_SITE_OTHER): Payer: Medicare PPO | Admitting: Psychiatry

## 2020-05-30 DIAGNOSIS — F3130 Bipolar disorder, current episode depressed, mild or moderate severity, unspecified: Secondary | ICD-10-CM | POA: Diagnosis not present

## 2020-05-30 DIAGNOSIS — F4310 Post-traumatic stress disorder, unspecified: Secondary | ICD-10-CM | POA: Diagnosis not present

## 2020-05-30 MED ORDER — TRAZODONE HCL 150 MG PO TABS: 150.0000 mg | ORAL_TABLET | Freq: Every day | ORAL | 5 refills | Status: DC

## 2020-05-30 MED ORDER — PREGABALIN 300 MG PO CAPS: 300.0000 mg | ORAL_CAPSULE | Freq: Two times a day (BID) | ORAL | 5 refills | Status: DC

## 2020-05-30 MED ORDER — DULOXETINE HCL 20 MG PO CPEP: 20.0000 mg | ORAL_CAPSULE | Freq: Four times a day (QID) | ORAL | 5 refills | Status: DC

## 2020-05-30 MED ORDER — ALPRAZOLAM 1 MG PO TABS: 1.0000 mg | ORAL_TABLET | Freq: Three times a day (TID) | ORAL | 5 refills | Status: DC

## 2020-05-30 NOTE — Progress Notes (Signed)
Virtual Visit via Telephone Note  I connected with Tara Levine on 05/30/20 at  2:00 PM EDT by telephone and verified that I am speaking with the correct person using two identifiers.  Location: Patient: Home Provider: Hospital   I discussed the limitations, risks, security and privacy concerns of performing an evaluation and management service by telephone and the availability of in person appointments. I also discussed with the patient that there may be a patient responsible charge related to this service. The patient expressed understanding and agreed to proceed.   History of Present Illness: Patient reached by telephone.  No new complaint.  Struggling to keep her mood up but generally succeeding.  Still struggles with guilt and grief.  Also chronic pain which limits her movement.  She is hoping that some work that she may have done surgically or with injections this year could help with the pain.  Affect is reactive and appropriate.  Generally sounds more energetic and optimistic than last time.    Observations/Objective: No suicidal or homicidal ideation.  No expression of psychosis.  No evident thought disorder.  Denies any misuse or abuse of medicine   Assessment and Plan: Supportive therapy and encouragement.  Reviewed treatment plan.  Patient never took the [place] that I had suggested and prescribed last time so that will be discontinued.  She is just taking the Cymbalta trazodone pregabalin and alprazolam all of which are renewed for 6 months.   Follow Up Instructions: Follow-up 6 months    I discussed the assessment and treatment plan with the patient. The patient was provided an opportunity to ask questions and all were answered. The patient agreed with the plan and demonstrated an understanding of the instructions.   The patient was advised to call back or seek an in-person evaluation if the symptoms worsen or if the condition fails to improve as anticipated.  I provided 20  minutes of non-face-to-face time during this encounter.   Alethia Berthold, MD

## 2020-05-31 ENCOUNTER — Telehealth: Payer: Self-pay

## 2020-05-31 NOTE — Telephone Encounter (Signed)
    received fax requesting a refill on the duloxetine

## 2020-05-31 NOTE — Telephone Encounter (Signed)
went online and submitted the prior auth for the duloxetine hck

## 2020-06-01 NOTE — Telephone Encounter (Signed)
received a fax that duloxetine hcl dr. 20mg  cap was approved until 02-24-21

## 2020-12-27 ENCOUNTER — Encounter: Payer: Self-pay | Admitting: Oncology

## 2020-12-27 ENCOUNTER — Inpatient Hospital Stay: Payer: Medicare PPO

## 2020-12-27 ENCOUNTER — Inpatient Hospital Stay: Payer: Medicare PPO | Attending: Oncology | Admitting: Oncology

## 2020-12-27 ENCOUNTER — Other Ambulatory Visit: Payer: Self-pay

## 2020-12-27 VITALS — BP 129/88 | HR 92 | Temp 96.9°F | Wt 264.2 lb

## 2020-12-27 DIAGNOSIS — R7989 Other specified abnormal findings of blood chemistry: Secondary | ICD-10-CM

## 2020-12-27 DIAGNOSIS — R944 Abnormal results of kidney function studies: Secondary | ICD-10-CM | POA: Insufficient documentation

## 2020-12-27 DIAGNOSIS — C9210 Chronic myeloid leukemia, BCR/ABL-positive, not having achieved remission: Secondary | ICD-10-CM | POA: Diagnosis present

## 2020-12-27 DIAGNOSIS — D649 Anemia, unspecified: Secondary | ICD-10-CM | POA: Insufficient documentation

## 2020-12-27 DIAGNOSIS — F32A Depression, unspecified: Secondary | ICD-10-CM | POA: Insufficient documentation

## 2020-12-27 DIAGNOSIS — E611 Iron deficiency: Secondary | ICD-10-CM | POA: Insufficient documentation

## 2020-12-27 DIAGNOSIS — R5383 Other fatigue: Secondary | ICD-10-CM | POA: Diagnosis not present

## 2020-12-27 DIAGNOSIS — E1165 Type 2 diabetes mellitus with hyperglycemia: Secondary | ICD-10-CM | POA: Diagnosis not present

## 2020-12-27 DIAGNOSIS — F419 Anxiety disorder, unspecified: Secondary | ICD-10-CM | POA: Diagnosis not present

## 2020-12-27 DIAGNOSIS — R61 Generalized hyperhidrosis: Secondary | ICD-10-CM | POA: Insufficient documentation

## 2020-12-27 LAB — COMPREHENSIVE METABOLIC PANEL
ALT: 41 U/L (ref 0–44)
AST: 33 U/L (ref 15–41)
Albumin: 4.2 g/dL (ref 3.5–5.0)
Alkaline Phosphatase: 84 U/L (ref 38–126)
Anion gap: 9 (ref 5–15)
BUN: 30 mg/dL — ABNORMAL HIGH (ref 8–23)
CO2: 24 mmol/L (ref 22–32)
Calcium: 9.6 mg/dL (ref 8.9–10.3)
Chloride: 101 mmol/L (ref 98–111)
Creatinine, Ser: 0.88 mg/dL (ref 0.44–1.00)
GFR, Estimated: >60 mL/min (ref >60)
Glucose, Bld: 263 mg/dL — ABNORMAL HIGH (ref 70–99)
Potassium: 4.6 mmol/L (ref 3.5–5.1)
Sodium: 134 mmol/L — ABNORMAL LOW (ref 135–145)
Total Bilirubin: 0.3 mg/dL (ref 0.3–1.2)
Total Protein: 8.1 g/dL (ref 6.5–8.1)

## 2020-12-27 LAB — CBC WITH DIFFERENTIAL/PLATELET
Abs Immature Granulocytes: 0.03 10*3/uL (ref 0.00–0.07)
Basophils Absolute: 0 10*3/uL (ref 0.0–0.1)
Basophils Relative: 0 %
Eosinophils Absolute: 0.2 10*3/uL (ref 0.0–0.5)
Eosinophils Relative: 2 %
HCT: 39.5 % (ref 36.0–46.0)
Hemoglobin: 12.9 g/dL (ref 12.0–15.0)
Immature Granulocytes: 0 %
Lymphocytes Relative: 16 %
Lymphs Abs: 1.2 10*3/uL (ref 0.7–4.0)
MCH: 26.5 pg (ref 26.0–34.0)
MCHC: 32.7 g/dL (ref 30.0–36.0)
MCV: 81.1 fL (ref 80.0–100.0)
Monocytes Absolute: 0.7 10*3/uL (ref 0.1–1.0)
Monocytes Relative: 9 %
Neutro Abs: 5.3 10*3/uL (ref 1.7–7.7)
Neutrophils Relative %: 73 %
Platelets: 385 10*3/uL (ref 150–400)
RBC: 4.87 MIL/uL (ref 3.87–5.11)
RDW: 14.9 % (ref 11.5–15.5)
WBC: 7.4 10*3/uL (ref 4.0–10.5)
nRBC: 0 % (ref 0.0–0.2)

## 2020-12-27 LAB — RETIC PANEL
Immature Retic Fract: 8 % (ref 2.3–15.9)
RBC.: 4.91 MIL/uL (ref 3.87–5.11)
Retic Count, Absolute: 63.8 10*3/uL (ref 19.0–186.0)
Retic Ct Pct: 1.3 % (ref 0.4–3.1)
Reticulocyte Hemoglobin: 30 pg (ref >27.9)

## 2020-12-27 LAB — LACTATE DEHYDROGENASE: LDH: 117 U/L (ref 98–192)

## 2020-12-27 LAB — IRON AND TIBC
Iron: 44 ug/dL (ref 28–170)
Saturation Ratios: 10 % — ABNORMAL LOW (ref 10.4–31.8)
TIBC: 433 ug/dL (ref 250–450)
UIBC: 389 ug/dL

## 2020-12-27 LAB — TECHNOLOGIST SMEAR REVIEW: Plt Morphology: ADEQUATE

## 2020-12-27 LAB — FOLATE: Folate: 64 ng/mL (ref >5.9)

## 2020-12-27 LAB — VITAMIN B12: Vitamin B-12: 358 pg/mL (ref 180–914)

## 2020-12-27 LAB — FERRITIN: Ferritin: 38 ng/mL (ref 11–307)

## 2020-12-27 LAB — TSH: TSH: 2.205 u[IU]/mL (ref 0.350–4.500)

## 2020-12-27 NOTE — Progress Notes (Signed)
Hematology/Oncology Consult note [facility] Telephone:([phone] [phone]      Clinic Day:  12/27/2020  Referring physician: Kerri Perches, PA-C  Chief Complaint: Tara Levine is a 65 y.o. female presents for follow up of chronic myelogenous leukemia (CML)   PERTINENT ONCOLOGY HISTORY Patient previously followed up by Dr.Finnegan and Dr.Corcoran, patient switched care to me on 01/12/21 Extensive medical record review was performed by me  Patient was initially seen by Dr. Paris Lore at and Dr. Cecille Aver.  She was seen by Dr. Mike Gip on 12/28/2018. CML was diagnosed in 2011. Bone marrow on 11/2009 revealed CML in chronic phase with no increase in blast (4%).  B3a2 transcript was 60% (p210).   11/2009, patient was started on nilotinib 300 mg twice daily.  Because of weight gain and intolerance to neratinib, dose was reduced to 150 mg twice daily. 12/2011 BCR ABL IS 0.11% 02/2012 nilotinib was increased to 600 mg twice daily 05/2012 patient was intubated in ICU for possible aspiration, narcotic effects, cardiac arrest and septic shock. nilotinib was held.  Restarted at a dose of 200 mg p.o. daily with dose escalation beginning May 2014. 08/2012 BCR ABL 0.55%  03/2013 patient was switched to bosutinib 400 mg daily. Patient was noticed to have medical noncompliance, missing multiple appointments. Concern was raised about an increasing BCR ABL transcript.  06/12/2017, patient was seen by [facility] Dr. Dellis Filbert for second opinion. Per note, patient continued bosutinib and to she lost her insurance in July 2019. BCR-ABLR was < 0.0032% therefore Dr. Mike Gip recommend patient to stay off bosutinib.  Patient then lost follow-up after that appointment.  Patient has psychiatric issues, major depression and anxiety..  She is very anxious in the clinic today.  Accompanied by her Sister Tara Levine. Patient reports extremely fatigued lately.  Also night sweats.  She  restarted bosutinib using leftover supply and stayed on the medication for months.  Last taking bosutinib a week ago. She lives at home by herself.  Widowed 10/15/2020, hemoglobin 7.5, MCV 89.1.  White count 6.0.  Platelet 267. Creatinine 1.44   Past Medical History:  Diagnosis Date   CML (chronic myelocytic leukemia) ([place])    Depression    DM type 2 (diabetes mellitus, type 2) ([place])    Environmental allergies    History of gastric ulcer    HLD (hyperlipidemia)    IDA (iron deficiency anemia)    chronic   Leukemia (HCC)    Leukemia, chronic myeloid ([place])    Osteoarthritis    Panic disorder    PSS (progressive systemic sclerosis) ([place])    Unspecified essential hypertension     Past Surgical History:  Procedure Laterality Date   back and neck surgery  1999, 2009   had rods placed   [facility]) No blackages found.    CARPAL TUNNEL RELEASE     PARTIAL HYSTERECTOMY     age 36, no cancer    Family History  Problem Relation Age of Onset   Diabetes Mother    Hypertension Mother    Heart disease Mother    Prostate cancer Father    Heart disease Father    Diabetes Brother     Social History:  reports that she has never smoked. She has never used smokeless tobacco. She reports that she does not drink alcohol and does not use drugs.  Patient is widowed.  Lives by herself.  Allergies:  Allergies  Allergen Reactions  Morphine Rash    Headache too   Codeine Other (See Comments)    Reaction: Unknown    Latex Other (See Comments)    Reaction:  Unknown    Morphine And Related Other (See Comments)    Reaction:  Unknown    Relafen [Nabumetone] Hives   Nsaids Rash   Tape Other (See Comments) and Rash    Reaction:  Unknown     Current Medications: Current Outpatient Medications  Medication Sig Dispense Refill   ACCU-CHEK GUIDE test strip TEST ONCE DAILY AND AS DIRECTED.  3   ACCU-CHEK SOFTCLIX LANCETS lancets Use 1 each  3 (three) times daily. Use as instructed.     albuterol (PROVENTIL HFA;VENTOLIN HFA) 108 (90 BASE) MCG/ACT inhaler Inhale 2 puffs into the lungs every 6 (six) hours as needed for wheezing or shortness of breath.     albuterol (PROVENTIL) (2.5 MG/3ML) 0.083% nebulizer solution INHALE CONTENTS OF 1 VIAL VIA NEBULIZER THREE TIMES A DAY prn     ALPRAZolam (XANAX) 1 MG tablet TAKE ONE TABLET BY MOUTH THREE TIMES DAILY 90 tablet 5   ALPRAZolam (XANAX) 1 MG tablet Take 1 tablet (1 mg total) by mouth 3 (three) times daily. 90 tablet 5   Ascorbic Acid (VITAMIN C) 1000 MG tablet Take 3,000 mg by mouth daily.     Blood Glucose Monitoring Suppl (GLUCOCOM BLOOD GLUCOSE MONITOR) DEVI Use as directed.     cetirizine (ZYRTEC) 10 MG tablet Take 20 mg by mouth daily as needed.      Cholecalciferol (VITAMIN D PO) Take 1 tablet by mouth daily.     co-enzyme Q-10 50 MG capsule Take 50 mg by mouth daily.     diphenhydrAMINE (BENADRYL) 25 MG tablet Take 25 mg by mouth every 6 (six) hours as needed for itching or allergies.      DULoxetine (CYMBALTA) 20 MG capsule Take 1 capsule (20 mg total) by mouth in the morning, at noon, in the evening, and at bedtime. 120 capsule 5   fluticasone (FLONASE) 50 MCG/ACT nasal spray Place 1-2 sprays into both nostrils as needed for rhinitis.      furosemide (LASIX) 40 MG tablet Take 1 tablet (40 mg total) by mouth daily. 30 tablet 3   insulin aspart (NOVOLOG) 100 UNIT/ML injection Inject 2-8 Units into the skin 3 (three) times daily as needed for high blood sugar. Pt reports she is taking 2-8 units per sliding scale based on glucose     insulin glargine (LANTUS) 100 UNIT/ML injection Inject 0.37 mLs (37 Units total) into the skin daily. (Patient taking differently: Inject 20 Units into the skin daily.) 10 mL 11   losartan (COZAAR) 100 MG tablet Take 1 tablet (100 mg total) by mouth daily. 30 tablet 0   montelukast (SINGULAIR) 10 MG tablet Take 1 tablet (10 mg total) by mouth at bedtime.  30 tablet 5   Olopatadine HCl 0.2 % SOLN Place 1 drop into both eyes daily.     Omega-3 Fatty Acids (OMEGA 3 PO) Take 3 capsules by mouth daily.     omeprazole (PRILOSEC) 40 MG capsule Take 40 mg by mouth daily. Reported on 04/21/2015     pregabalin (LYRICA) 300 MG capsule TAKE 1 CAPSULE BY MOUTH TWICE A DAY 60 capsule 5   pregabalin (LYRICA) 300 MG capsule Take 1 capsule (300 mg total) by mouth 2 (two) times daily. 60 capsule 5   traZODone (DESYREL) 150 MG tablet Take 1 tablet (150 mg total) by mouth  at bedtime. 30 tablet 5   vitamin B-12 (CYANOCOBALAMIN) 1000 MCG tablet Take 4,000 mcg by mouth daily.     polyethylene glycol (MIRALAX MIX-IN PAX) 17 g packet Take 17 g by mouth daily as needed. (Patient not taking: Reported on 12/27/2020) 14 each 0   No current facility-administered medications for this visit.    Review of Systems  Constitutional:  Positive for malaise/fatigue. Negative for chills, fever and weight loss.       She does not feel good.  HENT:  Negative for congestion, ear pain, hearing loss, nosebleeds, sinus pain, sore throat and tinnitus.   Eyes: Negative.  Negative for blurred vision, double vision and photophobia.  Respiratory:  Negative for cough, hemoptysis, sputum production, shortness of breath and wheezing.   Cardiovascular:  Negative for chest pain, palpitations and PND.       CHF.  Gastrointestinal: Negative.  Negative for blood in stool, diarrhea, nausea and vomiting.  Genitourinary:  Negative for dysuria, hematuria and urgency.       Stress incontinence.  Musculoskeletal:  Positive for back pain (rods in spine) and joint pain (knees, shoulders, ankles, wrists). Negative for myalgias.       Scoliosis.  Skin:  Negative for rash.  Neurological:  Positive for sensory change (numbness in feet on Lyrica). Negative for dizziness, tremors, speech change, seizures, weakness and headaches. Focal weakness: issues with balance. Endo/Heme/Allergies: Negative.  Does not  bruise/bleed easily.  Psychiatric/Behavioral:  Positive for depression and memory loss (poor short term memory). Negative for substance abuse and suicidal ideas.        Tearful because of health condition.  All other systems reviewed and are negative. Performance status (ECOG): 1  Vitals Blood pressure 129/88, pulse 92, temperature (!) 96.9 F (36.1 C), temperature source Tympanic, weight 264 lb 3.2 oz (119.8 kg).  Physical Exam Vitals and nursing note reviewed.  Constitutional:      General: She is not in acute distress.    Appearance: She is well-developed. She is obese. She is not diaphoretic.  HENT:     Head: Normocephalic and atraumatic.  Eyes:     General: No scleral icterus.    Conjunctiva/sclera: Conjunctivae normal.  Cardiovascular:     Rate and Rhythm: Normal rate and regular rhythm.  Pulmonary:     Effort: Pulmonary effort is normal. No respiratory distress.     Breath sounds: No wheezing.  Abdominal:     Palpations: Abdomen is soft.  Musculoskeletal:        General: No swelling. Normal range of motion.     Cervical back: Normal range of motion.  Skin:    General: Skin is warm.  Neurological:     Mental Status: She is alert and oriented to person, place, and time.  Psychiatric:        Mood and Affect: Mood normal.     Comments: Tearful at times.    Appointment on 12/27/2020  Component Date Value Ref Range Status   LDH 12/27/2020 117  98 - 192 U/L Final   Performed at [facility], [address] [place], [place] [address]   Retic Ct Pct 12/27/2020 1.3  0.4 - 3.1 % Final   RBC. 12/27/2020 4.91  3.87 - 5.11 MIL/uL Final   Retic Count, Absolute 12/27/2020 63.8  19.0 - 186.0 K/uL Final   Immature Retic Fract 12/27/2020 8.0  2.3 - 15.9 % Final   Reticulocyte Hemoglobin 12/27/2020 30.0  >27.9 pg Final   Performed at [facility]  [place] [address], [address] [place], [place] [address]   WBC 12/27/2020 7.4  4.0 - 10.5 K/uL Final   RBC 12/27/2020 4.87  3.87 -  5.11 MIL/uL Final   Hemoglobin 12/27/2020 12.9  12.0 - 15.0 g/dL Final   HCT 12/27/2020 39.5  36.0 - 46.0 % Final   MCV 12/27/2020 81.1  80.0 - 100.0 fL Final   MCH 12/27/2020 26.5  26.0 - 34.0 pg Final   MCHC 12/27/2020 32.7  30.0 - 36.0 g/dL Final   RDW 12/27/2020 14.9  11.5 - 15.5 % Final   Platelets 12/27/2020 385  150 - 400 K/uL Final   nRBC 12/27/2020 0.0  0.0 - 0.2 % Final   Neutrophils Relative % 12/27/2020 73  % Final   Neutro Abs 12/27/2020 5.3  1.7 - 7.7 K/uL Final   Lymphocytes Relative 12/27/2020 16  % Final   Lymphs Abs 12/27/2020 1.2  0.7 - 4.0 K/uL Final   Monocytes Relative 12/27/2020 9  % Final   Monocytes Absolute 12/27/2020 0.7  0.1 - 1.0 K/uL Final   Eosinophils Relative 12/27/2020 2  % Final   Eosinophils Absolute 12/27/2020 0.2  0.0 - 0.5 K/uL Final   Basophils Relative 12/27/2020 0  % Final   Basophils Absolute 12/27/2020 0.0  0.0 - 0.1 K/uL Final   Immature Granulocytes 12/27/2020 0  % Final   Abs Immature Granulocytes 12/27/2020 0.03  0.00 - 0.07 K/uL Final   Performed at [facility], [place], [place] [address]   Sodium 12/27/2020 134 (A)  135 - 145 mmol/L Final   Potassium 12/27/2020 4.6  3.5 - 5.1 mmol/L Final   Chloride 12/27/2020 101  98 - 111 mmol/L Final   CO2 12/27/2020 24  22 - 32 mmol/L Final   Glucose, Bld 12/27/2020 263 (A)  70 - 99 mg/dL Final   Glucose reference range applies only to samples taken after fasting for at least 8 hours.   BUN 12/27/2020 30 (A)  8 - 23 mg/dL Final   Creatinine, Ser 12/27/2020 0.88  0.44 - 1.00 mg/dL Final   Calcium 12/27/2020 9.6  8.9 - 10.3 mg/dL Final   Total Protein 12/27/2020 8.1  6.5 - 8.1 g/dL Final   Albumin 12/27/2020 4.2  3.5 - 5.0 g/dL Final   AST 12/27/2020 33  15 - 41 U/L Final   ALT 12/27/2020 41  0 - 44 U/L Final   Alkaline Phosphatase 12/27/2020 84  38 - 126 U/L Final   Total Bilirubin 12/27/2020 0.3  0.3 - 1.2 mg/dL Final   GFR, Estimated 12/27/2020 >60  >60 mL/min Final    Comment: (NOTE) Calculated using the CKD-EPI Creatinine Equation (2021)    Anion gap 12/27/2020 9  5 - 15 Final   Performed at [facility], [place], [place] [address]   TSH 12/27/2020 2.205  0.350 - 4.500 uIU/mL Final   Comment: Performed by a 3rd Generation assay with a functional sensitivity of <=0.01 uIU/mL. Performed at [facility], [address] [place], [place] [address]    Folate 12/27/2020 64.0  >5.9 ng/mL Final   Comment: RESULT CONFIRMED BY MANUAL DILUTION MU Performed at [facility], [address] [place], [place] [address]    Ferritin 12/27/2020 38  11 - 307 ng/mL Final   Performed at [facility], [address] [place], [place] [address]   Iron 12/27/2020 44  28 - 170 ug/dL Final   TIBC 12/27/2020  433  250 - 450 ug/dL Final   Saturation Ratios 12/27/2020 10 (A)  10.4 - 31.8 % Final   UIBC 12/27/2020 389  ug/dL Final   Performed at [facility], [address] [place], [place] [address]   WBC MORPHOLOGY 12/27/2020 MORPHOLOGY UNREMARKABLE   Final   RBC MORPHOLOGY 12/27/2020 MORPHOLOGY UNREMARKABLE   Final   Tech Review 12/27/2020 PLATELETS APPEAR ADEQUATE   Final   Comment: PLATELETS VARY IN SIZE Performed at [facility], [address] [place], [place] [address]     Assessment and plan 1. CML (chronic myelocytic leukemia) ([place])   2. Anemia, unspecified type   3. Elevated serum creatinine   4. Other fatigue    #CML Patient was previously treated with nilotinib and then switched to bosutinib.  Patient was off the treatment for approximately 2 years and restarted a month ago.  Last on medication 1 week ago. Check CBC, CMP, BCR ABL1 FISH, BCR ABL 1QT PCR.  Flow cytometry  #Anemia, Check CBC, iron TIBC ferritin, folate and vitamin B12. -Iron saturation 10, ferritin 38.  Hemoglobin has recovered to 12.9.  #Elevated creatinine Repeat renal function, check SPEP. Creatinine has  normalized.  #Uncontrolled diabetes.  Glucose level 263.  #Fatigue, night sweats.  Etiology unknown.  Pending above work-up.  Also check flow cytometry.  I discussed the assessment and treatment plan with the patient.  The patient was provided an opportunity to ask questions and all were answered.  The patient agreed with the plan and demonstrated an understanding of the instructions.  The patient was advised to call back if the symptoms worsen or if the condition fails to improve as anticipated.  We spent sufficient time to discuss many aspect of care, questions were answered to patient's satisfaction. A total of 40 minutes was spent on this visit.  With 10 minutes spent reviewing image findings, pathology reports, 25 minutes counseling the patient on the diagnosis, goal of care, chemotherapy treatments, side effects of the treatment, management of symptoms.  Additional 5 minutes was spent on answering patient's questions.   Earlie Server, MD, PhD Hematology Oncology [place] at [facility] 12/27/2020

## 2020-12-28 LAB — KAPPA/LAMBDA LIGHT CHAINS
Kappa free light chain: 34.7 mg/L — ABNORMAL HIGH (ref 3.3–19.4)
Kappa, lambda light chain ratio: 1.39 (ref 0.26–1.65)
Lambda free light chains: 25 mg/L (ref 5.7–26.3)

## 2020-12-29 ENCOUNTER — Telehealth: Payer: Self-pay | Admitting: *Deleted

## 2020-12-29 LAB — COMP PANEL: LEUKEMIA/LYMPHOMA

## 2020-12-29 NOTE — Telephone Encounter (Signed)
Patient called voicing her concern over lab results and not having heard anything regarding results. She is requesting  a return call to go over any available results Her next appointment is 01/09/21.   Technologist smear review  Order: [id number] Status: Final result   Component 2 d ago    WBC MORPHOLOGY MORPHOLOGY UNREMARKABLE   RBC MORPHOLOGY MORPHOLOGY UNREMARKABLE   Tech Review PLATELETS APPEAR ADEQUATE   Comment: PLATELETS VARY IN SIZE   Performed at [facility], [address], [place], [place] [address]    Resulting Agency [facility] CLIN LAB    Specimen Collected: 12/27/20 15:13 Last Resulted: 12/27/20 16:42  Other Results from 12/27/2020 Multiple Myeloma Panel (SPEP&IFE w/QIG) Order: [id number] Status: In process      Specimen Collected: 12/27/20 15:09 Last Resulted: 12/27/20 15:12   Retic Panel Order: [id number] Status: Final result   Component Ref Range & Units 2 d ago    Retic Ct Pct 0.4 - 3.1 % 1.3   RBC. 3.87 - 5.11 MIL/uL 4.91   Retic Count, Absolute 19.0 - 186.0 K/uL 63.8   Immature Retic Fract 2.3 - 15.9 % 8.0   Reticulocyte Hemoglobin >27.9 pg 30.0   Comment: Performed at [facility], [address] [place], [place] [address]  Resulting Agency  [facility] CLIN LAB    Specimen Collected: 12/27/20 15:09 Last Resulted: 12/27/20 15:54  Vitamin B12 Order: [id number] Status: Final result   Component Ref Range & Units 2 d ago    Vitamin B-12 180 - 914 pg/mL 358   Comment: (NOTE)   This assay is not validated for testing neonatal or   myeloproliferative syndrome specimens for Vitamin B12 levels.   Performed at [facility], [place] [address], [place], [place]   [address]    Resulting Agency  [facility] CLIN LAB    Specimen Collected: 12/27/20 15:09 Last Resulted: 12/27/20 20:43  BCR-ABL1 FISH Order: [id number] Status: In process      Specimen Collected: 12/27/20 15:08 Last Resulted: 12/27/20 15:11   BCR-ABL1, CML/ALL, PCR, QUANT Order: [id number] Status: In  process     Specimen Collected: 12/27/20 15:08 Last Resulted: 12/27/20 15:11  Lactate dehydrogenase Order: [id number] Status: Final result   Component Ref Range & Units 2 d ago    LDH 98 - 192 U/L 117   Comment: Performed at [facility], [address] [place],  [address]  Resulting Agency  [facility] CLIN LAB    Specimen Collected: 12/27/20 15:08 Last Resulted: 12/27/20 15:48  Flow cytometry panel-leukemia/lymphoma work-up Order: [id number] Status: Edited Result - FINAL   Component 2 d ago    PATH INTERP XXX-IMP Comment   Comment: No significant immunophenotypic abnormality detected  CLINICAL INFO Comment VC   Comment: (NOTE)   Accompanying CBC date not specified shows:   WBC count 7.4, Neu 5.3, Lym 1.2, Mon 0.7.    Specimen Type Comment   Comment: Peripheral blood  ASSESSMENT OF LEUKOCYTES Comment   Comment: (NOTE)   No monoclonal B cell population is detected.   kappa:lambda ratio 1.5   There is no loss of, or aberrant expression of, the pan T cell   antigens to   suggest a neoplastic T cell process.   CD4:CD8 ratio 1.7   No circulating blasts are detected.   There is no immunophenotypic  evidence of abnormal myeloid maturation.   Analysis of the leukocyte population shows: granulocytes 75%,   monocytes 8%,   lymphocytes 17%, blasts <0.1%, B cells 2%, T  cells 11%, NK cells 4%.    % Viable Cells Comment VC   Comment: 91%  ANALYSIS AND GATING STRATEGY Comment   Comment: 8 color analysis with CD45/SSC gating  IMMUNOPHENOTYPING STUDY Comment   Comment: (NOTE)   CD2       Normal         CD3       Normal   CD4       Normal         CD5       Normal   CD7       Normal         CD8       Normal   CD10      Normal         CD11b     Normal   CD13      Normal         CD14      Normal   CD16      Normal         CD19      Normal   CD20      Normal         CD33      Normal   CD34      Normal         CD38      Normal   CD45      Normal         CD56      Normal   CD57       Normal         CD117     Normal   HLA-DR    Normal         KAPPA     Normal   LAMBDA    Normal         CD64      Normal    PATHOLOGIST NAME Comment   Comment: Smiley Houseman, M.D.  COMMENT: Comment VC   Comment: (NOTE)   Each antibody in this assay was utilized to assess for potential   abnormalities of studied cell populations or to characterize   identified abnormalities.   This test was developed and its performance characteristics   determined by [facility].  It has not been cleared or approved by the   U.S. Food and Drug Administration.   The FDA has determined that such clearance or approval is not   necessary. This test is used for clinical purposes.  It should not   be regarded as investigational or for research.   Performed At: [facility]   [address] [place], [place] [id number]   Katina Degree MDPhD [id number]   Performed At: [facility]   [address] [place], [place] [id number]   Katina Degree MDPhD [id number]    Resulting Agency [facility] CLIN LAB    Specimen Collected: 12/27/20 15:08 Last Resulted: 12/29/20 11:37  VC=Value has a corrected status      Contains abnormal data Kappa/lambda light chains Order: [id number]  Status: Final result   Component Ref Range & Units 2 d ago    Kappa free light chain 3.3 - 19.4 mg/L 34.7 High    Lambda free light chains 5.7 - 26.3 mg/L 25.0   Kappa, lambda light chain ratio 0.26 - 1.65 1.39   Comment: (NOTE)   Performed At: [facility]   [address] [place], [place] [id number]   Rush Farmer  MD [id number]    Resulting Agency  [facility] CLIN LAB    Specimen Collected: 12/27/20 15:08 Last Resulted: 12/28/20 14:37  CBC with Differential/Platelet Order: [id number] Status: Final result   Component Ref Range & Units 2 d ago  (12/27/20) 1 yr ago  (06/23/19) 1 yr ago  (06/22/19) 2 yr ago  (12/28/18) 2 yr ago  (11/29/18) 3 yr ago  (01/10/17) 4 yr ago  (08/30/16)  WBC 4.0 - 10.5 K/uL 7.4  6.9  5.3  4.3  7.6  6.5 R  5.9  R   RBC 3.87 - 5.11 MIL/uL 4.87  4.26  4.35  4.49  3.80 Low   4.91 R  4.43 R   Hemoglobin 12.0 - 15.0 g/dL 12.9  12.2  12.5  12.9  10.8 Low   13.9 R  12.3 R   HCT 36.0 - 46.0 % 39.5  36.1  36.8  38.0  32.4 Low   41.5 R  37.2 R   MCV 80.0 - 100.0 fL 81.1  84.7  84.6  84.6  85.3  84.5  84.1   MCH 26.0 - 34.0 pg 26.5  28.6  28.7  28.7  28.4  28.3  27.7   MCHC 30.0 - 36.0 g/dL 32.7  33.8  34.0  33.9  33.3  33.5 R  32.9 R   RDW 11.5 - 15.5 % 14.9  12.5  12.4  14.1  14.6  14.3 R  14.2 R   Platelets 150 - 400 K/uL 385  276  242  234  236  267 R  221 R   nRBC 0.0 - 0.2 % 0.0  0.0 CM  0.0  0.0  0.0     Neutrophils Relative % % 73   71  56  76  69  73   Neutro Abs 1.7 - 7.7 K/uL 5.3   3.8  2.4  5.7  4.5 R  4.4 R   Lymphocytes Relative % '16   17  30  11  20  16   ' Lymphs Abs 0.7 - 4.0 K/uL 1.2   0.9  1.3  0.9  1.3 R  1.0 R   Monocytes Relative % '9   6  11  10  7  7   ' Monocytes Absolute 0.1 - 1.0 K/uL 0.7   0.3  0.5  0.8  0.4 R  0.4 R   Eosinophils Relative % '2   2  3  3  3  3   ' Eosinophils Absolute 0.0 - 0.5 K/uL 0.2   0.1  0.1  0.2  0.2 R  0.2 R   Basophils Relative % 0   1  0  0  1  1   Basophils Absolute 0.0 - 0.1 K/uL 0.0   0.1  0.0  0.0  0.0 R  0.0 R   Immature Granulocytes % 0   3  0  0     Abs Immature Granulocytes 0.00 - 0.07 K/uL 0.03   0.14 High  CM  0.01 CM  0.03 CM     Comment: Performed at [facility], [address] [place], [place] [address]  Resulting Agency  [facility] CLIN LAB [place] CLIN LAB [place] CLIN LAB [place] CLIN LAB [place] CLIN LAB [place] CLIN LAB [place] CLIN LAB    Specimen Collected: 12/27/20 15:08 Last Resulted: 12/27/20 15:38  Component Ref Range & Units 2 d ago  (12/27/20) 1 yr ago  (06/22/19) 2 yr ago  (12/28/18) 2  yr ago  (11/29/18) 3 yr ago  (01/10/17) 4 yr ago  (08/30/16) 4 yr ago  (05/17/16) 4 yr ago  (05/17/16)  Sodium 135 - 145 mmol/L 134 Low   135  137  132 Low   134 Low   135   136   Potassium 3.5 - 5.1 mmol/L 4.6  4.4  4.2  4.5 CM  3.7  4.0   4.1   Chloride 98 - 111 mmol/L 101  99   100  97 Low   100 Low  R  98 Low  R   100 Low  R   CO2 22 - 32 mmol/L '24  26  26  24  24  28   28   ' Glucose, Bld 70 - 99 mg/dL 263 High   368 High  CM  157 High   219 High   187 High  R  177 High  R   166 High  R   Comment: Glucose reference range applies only to samples taken after fasting for at least 8 hours.  BUN 8 - 23 mg/dL 30 High   '22  18  20  15 ' R  23 High  R   16 R   Creatinine, Ser 0.44 - 1.00 mg/dL 0.88  0.74  1.01 High   1.11 High   0.93  1.01 High    0.92   Calcium 8.9 - 10.3 mg/dL 9.6  9.0  9.7  9.1  9.5  9.0   9.2   Total Protein 6.5 - 8.1 g/dL 8.1  6.2 Low   7.4  7.5  7.4  6.7  6.9    Albumin 3.5 - 5.0 g/dL 4.2  3.2 Low   3.8  3.6  3.8  3.1 Low   3.4 Low     AST 15 - 41 U/L 33  18  25  45 High  CM  38  38  64 High     ALT 0 - 44 U/L 41  '18  21  16 ' CM  34 R  37 R  72 High  R    Alkaline Phosphatase 38 - 126 U/L 84  64  82  68  59  58  58    Total Bilirubin 0.3 - 1.2 mg/dL 0.3  0.7  0.5  1.3 High  CM  0.5  0.5  0.4    GFR, Estimated >60 mL/min >60          Comment: (NOTE)   Calculated using the CKD-EPI Creatinine Equation (2021)    Anion gap 5 - '15 9  10 ' CM  11 CM  11 CM  '10  9   8   ' Comment: Performed at [facility], [address] [place], [place] [address]  Resulting Agency  [facility] CLIN LAB [place] CLIN LAB [place] CLIN LAB [place] CLIN LAB [place] CLIN LAB [place] CLIN LAB [place] CLIN LAB [place] CLIN LAB     Specimen Collected: 12/27/20 15:08 Last Resulted: 12/27/20 15:48  TSH Order: [id number]      Status: Final result     Component Ref Range & Units 2 d ago    TSH 0.350 - 4.500 uIU/mL 2.205   Comment: Performed by a 3rd Generation assay with a functional sensitivity of <=0.01 uIU/mL.   Performed at [facility] [address], [place], [place] [address]    Resulting Agency  [facility] CLIN LAB    Specimen Collected: 12/27/20 15:08 Last Resulted: 12/27/20  19:01  Folate Order: [id number] Status: Final result   Component Ref Range & Units 2 d ago    Folate >5.9 ng/mL 64.0    Comment: RESULT CONFIRMED BY MANUAL DILUTION MU   Performed at [facility], [address] [place], [place] [address]    Resulting Agency  [facility] CLIN LAB    Specimen Collected: 12/27/20 15:08 Last Resulted: 12/27/20 19:01  Ferritin Order: [id number] Status: Final result   Component Ref Range & Units 2 d ago    Ferritin 11 - 307 ng/mL 38   Comment: Performed at [facility], [address] [place], [place] [address]  Resulting Agency  [facility] CLIN LAB   Specimen Collected: 12/27/20 15:08 Last Resulted: 12/27/20 19:01  Component Ref Range & Units 2 d ago 8 yr ago  Iron 28 - 170 ug/dL 44  24 Low  R   TIBC 250 - 450 ug/dL 433    Saturation Ratios 10.4 - 31.8 % 10 Low     UIBC ug/dL 389    Comment: Performed at [facility], [address] [place], [place] [address]  Resulting Agency  [facility] CLIN [place] LAB CONVERSION   Specimen Collected: 12/27/20 15:08 Last Resulted: 12/27/20 19:01

## 2020-12-30 LAB — BCR-ABL1 FISH
Cells Analyzed: 300
Cells Counted: 300

## 2021-01-02 LAB — MULTIPLE MYELOMA PANEL, SERUM
Albumin SerPl Elph-Mcnc: 3.7 g/dL (ref 2.9–4.4)
Albumin/Glob SerPl: 1.1 (ref 0.7–1.7)
Alpha 1: 0.3 g/dL (ref 0.0–0.4)
Alpha2 Glob SerPl Elph-Mcnc: 1.1 g/dL — ABNORMAL HIGH (ref 0.4–1.0)
B-Globulin SerPl Elph-Mcnc: 1.3 g/dL (ref 0.7–1.3)
Gamma Glob SerPl Elph-Mcnc: 0.9 g/dL (ref 0.4–1.8)
Globulin, Total: 3.6 g/dL (ref 2.2–3.9)
IgA: 229 mg/dL (ref 87–352)
IgG (Immunoglobin G), Serum: 954 mg/dL (ref 586–1602)
IgM (Immunoglobulin M), Srm: 69 mg/dL (ref 26–217)
Total Protein ELP: 7.3 g/dL (ref 6.0–8.5)

## 2021-01-02 NOTE — Telephone Encounter (Signed)
Patient informed of doctor response, She is requesting that her labs be rechecked when she comes in on 11/15 due to the fact that she has changed her diet and no longer has night sweats or pain in her feet.

## 2021-01-02 NOTE — Telephone Encounter (Signed)
MD notified. Dr. Tasia Catchings would like to discuss labwork first, then decide if additional labs are needed. If so, she can get them done after visit with MD.

## 2021-01-02 NOTE — Telephone Encounter (Signed)
Multiple myeloma panel still pending. Dr. Tasia Catchings will discuss all lab results at 11/15 visit.

## 2021-01-06 LAB — BCR-ABL1, CML/ALL, PCR, QUANT: b3a2 transcript: 1.1677 %

## 2021-01-09 ENCOUNTER — Other Ambulatory Visit (HOSPITAL_COMMUNITY): Payer: Self-pay

## 2021-01-09 ENCOUNTER — Other Ambulatory Visit: Payer: Self-pay

## 2021-01-09 ENCOUNTER — Encounter: Payer: Self-pay | Admitting: Oncology

## 2021-01-09 ENCOUNTER — Telehealth: Payer: Self-pay | Admitting: Pharmacy Technician

## 2021-01-09 ENCOUNTER — Inpatient Hospital Stay (HOSPITAL_BASED_OUTPATIENT_CLINIC_OR_DEPARTMENT_OTHER): Payer: Medicare PPO | Admitting: Oncology

## 2021-01-09 DIAGNOSIS — E611 Iron deficiency: Secondary | ICD-10-CM | POA: Diagnosis not present

## 2021-01-09 DIAGNOSIS — R5383 Other fatigue: Secondary | ICD-10-CM

## 2021-01-09 DIAGNOSIS — C9210 Chronic myeloid leukemia, BCR/ABL-positive, not having achieved remission: Secondary | ICD-10-CM

## 2021-01-09 DIAGNOSIS — Z5111 Encounter for antineoplastic chemotherapy: Secondary | ICD-10-CM

## 2021-01-09 MED ORDER — BOSUTINIB 400 MG PO TABS: 400.0000 mg | ORAL_TABLET | Freq: Every day | ORAL | 1 refills | Status: DC

## 2021-01-09 NOTE — Progress Notes (Signed)
HEMATOLOGY-ONCOLOGY TeleHEALTH VISIT PROGRESS NOTE  I connected with Tara Levine on 01/09/21  at  3:15 PM EST by video enabled telemedicine visit and verified that I am speaking with the correct person using two identifiers. I discussed the limitations, risks, security and privacy concerns of performing an evaluation and management service by telemedicine and the availability of in-person appointments. The patient expressed understanding and agreed to proceed.   Other persons participating in the visit and their role in the encounter:  None  Patient's location: Home  Provider's location: office Chief Complaint: [name] is a 65 y.o. female who has above history reviewed by me today presents for follow up visit for management of CML Patient had a blood work done at the last visit and presents that virtually to discuss results.  She reports feeling weak.  Review of Systems  Constitutional:  Positive for fatigue. Negative for appetite change, chills and fever.  HENT:   Negative for hearing loss and voice change.   Eyes:  Negative for eye problems.  Respiratory:  Negative for chest tightness and cough.   Cardiovascular:  Negative for chest pain.  Gastrointestinal:  Negative for abdominal distention, abdominal pain and blood in stool.  Endocrine: Negative for hot flashes.  Genitourinary:  Negative for difficulty urinating and frequency.   Musculoskeletal:  Negative for arthralgias.  Skin:  Negative for itching and rash.  Neurological:  Negative for extremity weakness.  Hematological:  Negative for adenopathy.  Psychiatric/Behavioral:  Negative for confusion.    Past Medical History:  Diagnosis Date   CML (chronic myelocytic leukemia) ([place])    Depression    DM type 2 (diabetes mellitus, type 2) ([place])    Environmental allergies    History of gastric ulcer    HLD (hyperlipidemia)    IDA (iron deficiency anemia)    chronic   Leukemia (HCC)    Leukemia,  chronic myeloid (HCC)    Osteoarthritis    Panic disorder    PSS (progressive systemic sclerosis) ([place])    Unspecified essential hypertension    Past Surgical History:  Procedure Laterality Date   back and neck surgery  1999, 2009   had rods placed   [place] [facility]) No blackages found.    CARPAL TUNNEL RELEASE     PARTIAL HYSTERECTOMY     age 41, no cancer    Family History  Problem Relation Age of Onset   Diabetes Mother    Hypertension Mother    Heart disease Mother    Prostate cancer Father    Heart disease Father    Diabetes Brother     Social History   Socioeconomic History   Marital status: Married    Spouse name: Not on file   Number of children: Not on file   Years of education: Not on file   Highest education level: Not on file  Occupational History   Not on file  Tobacco Use   Smoking status: Never   Smokeless tobacco: Never   Tobacco comments:    quit 1985  Vaping Use   Vaping Use: Never used  Substance and Sexual Activity   Alcohol use: No    Alcohol/week: 0.0 standard drinks   Drug use: No   Sexual activity: Not Currently  Other Topics Concern   Not on file  Social History Narrative   Not on file   Social Determinants of Health   Financial Resource Strain:  Not on file  Food Insecurity: Not on file  Transportation Needs: Not on file  Physical Activity: Not on file  Stress: Not on file  Social Connections: Not on file  Intimate Partner Violence: Not on file    Current Outpatient Medications on File Prior to Visit  Medication Sig Dispense Refill   ACCU-CHEK GUIDE test strip TEST ONCE DAILY AND AS DIRECTED.  3   ACCU-CHEK SOFTCLIX LANCETS lancets Use 1 each 3 (three) times daily. Use as instructed.     albuterol (PROVENTIL HFA;VENTOLIN HFA) 108 (90 BASE) MCG/ACT inhaler Inhale 2 puffs into the lungs every 6 (six) hours as needed for wheezing or shortness of breath.     albuterol (PROVENTIL) (2.5  MG/3ML) 0.083% nebulizer solution INHALE CONTENTS OF 1 VIAL VIA NEBULIZER THREE TIMES A DAY prn     ALPRAZolam (XANAX) 1 MG tablet TAKE ONE TABLET BY MOUTH THREE TIMES DAILY 90 tablet 5   ALPRAZolam (XANAX) 1 MG tablet Take 1 tablet (1 mg total) by mouth 3 (three) times daily. 90 tablet 5   Ascorbic Acid (VITAMIN C) 1000 MG tablet Take 3,000 mg by mouth daily.     Blood Glucose Monitoring Suppl (GLUCOCOM BLOOD GLUCOSE MONITOR) DEVI Use as directed.     cetirizine (ZYRTEC) 10 MG tablet Take 20 mg by mouth daily as needed.      Cholecalciferol (VITAMIN D PO) Take 1 tablet by mouth daily.     co-enzyme Q-10 50 MG capsule Take 50 mg by mouth daily.     diphenhydrAMINE (BENADRYL) 25 MG tablet Take 25 mg by mouth every 6 (six) hours as needed for itching or allergies.      DULoxetine (CYMBALTA) 20 MG capsule Take 1 capsule (20 mg total) by mouth in the morning, at noon, in the evening, and at bedtime. 120 capsule 5   fluticasone (FLONASE) 50 MCG/ACT nasal spray Place 1-2 sprays into both nostrils as needed for rhinitis.      furosemide (LASIX) 40 MG tablet Take 1 tablet (40 mg total) by mouth daily. 30 tablet 3   insulin aspart (NOVOLOG) 100 UNIT/ML injection Inject 2-8 Units into the skin 3 (three) times daily as needed for high blood sugar. Pt reports she is taking 2-8 units per sliding scale based on glucose     insulin glargine (LANTUS) 100 UNIT/ML injection Inject 0.37 mLs (37 Units total) into the skin daily. (Patient taking differently: Inject 20 Units into the skin daily.) 10 mL 11   losartan (COZAAR) 100 MG tablet Take 1 tablet (100 mg total) by mouth daily. 30 tablet 0   montelukast (SINGULAIR) 10 MG tablet Take 1 tablet (10 mg total) by mouth at bedtime. 30 tablet 5   Omega-3 Fatty Acids (OMEGA 3 PO) Take 3 capsules by mouth daily.     omeprazole (PRILOSEC) 40 MG capsule Take 40 mg by mouth daily. Reported on 04/21/2015     pregabalin (LYRICA) 300 MG capsule TAKE 1 CAPSULE BY MOUTH TWICE A DAY  60 capsule 5   pregabalin (LYRICA) 300 MG capsule Take 1 capsule (300 mg total) by mouth 2 (two) times daily. 60 capsule 5   traZODone (DESYREL) 150 MG tablet Take 1 tablet (150 mg total) by mouth at bedtime. 30 tablet 5   vitamin B-12 (CYANOCOBALAMIN) 1000 MCG tablet Take 4,000 mcg by mouth daily.     Olopatadine HCl 0.2 % SOLN Place 1 drop into both eyes daily.     polyethylene glycol (MIRALAX MIX-IN PAX) 17  g packet Take 17 g by mouth daily as needed. (Patient not taking: Reported on 01/09/2021) 14 each 0   No current facility-administered medications on file prior to visit.    Allergies  Allergen Reactions   Morphine Rash    Headache too   Codeine Other (See Comments)    Reaction: Unknown    Latex Other (See Comments)    Reaction:  Unknown    Morphine And Related Other (See Comments)    Reaction:  Unknown    Relafen [Nabumetone] Hives   Nsaids Rash   Tape Other (See Comments) and Rash    Reaction:  Unknown        Observations/Objective: Today's Vitals   01/09/21 1449  PainSc: 6    There is no height or weight on file to calculate BMI.  Physical Exam Neurological:     Mental Status: She is alert.    CBC    Component Value Date/Time   WBC 7.4 12/27/2020 1508   RBC 4.91 12/27/2020 1509   RBC 4.87 12/27/2020 1508   HGB 12.9 12/27/2020 1508   HGB 12.4 03/02/2014 1411   HCT 39.5 12/27/2020 1508   HCT 37.6 03/02/2014 1411   PLT 385 12/27/2020 1508   PLT 234 03/02/2014 1411   MCV 81.1 12/27/2020 1508   MCV 87 03/02/2014 1411   MCH 26.5 12/27/2020 1508   MCHC 32.7 12/27/2020 1508   RDW 14.9 12/27/2020 1508   RDW 14.3 03/02/2014 1411   LYMPHSABS 1.2 12/27/2020 1508   LYMPHSABS 1.6 03/02/2014 1411   MONOABS 0.7 12/27/2020 1508   MONOABS 0.4 03/02/2014 1411   EOSABS 0.2 12/27/2020 1508   EOSABS 0.1 03/02/2014 1411   BASOSABS 0.0 12/27/2020 1508   BASOSABS 0.0 03/02/2014 1411    CMP     Component Value Date/Time   NA 134 (L) 12/27/2020 1508   NA 136  03/02/2014 1411   K 4.6 12/27/2020 1508   K 4.0 03/02/2014 1411   CL 101 12/27/2020 1508   CL 97 (L) 03/02/2014 1411   CO2 24 12/27/2020 1508   CO2 32 03/02/2014 1411   GLUCOSE 263 (H) 12/27/2020 1508   GLUCOSE 141 (H) 03/02/2014 1411   BUN 30 (H) 12/27/2020 1508   BUN 18 03/02/2014 1411   CREATININE 0.88 12/27/2020 1508   CREATININE 1.26 03/02/2014 1411   CALCIUM 9.6 12/27/2020 1508   CALCIUM 9.1 03/02/2014 1411   PROT 8.1 12/27/2020 1508   PROT 7.1 03/02/2014 1411   ALBUMIN 4.2 12/27/2020 1508   ALBUMIN 2.9 (L) 03/02/2014 1411   AST 33 12/27/2020 1508   AST 17 03/02/2014 1411   ALT 41 12/27/2020 1508   ALT 21 03/02/2014 1411   ALKPHOS 84 12/27/2020 1508   ALKPHOS 69 03/02/2014 1411   BILITOT 0.3 12/27/2020 1508   BILITOT 0.4 03/02/2014 1411   GFRNONAA >60 12/27/2020 1508   GFRNONAA 46 (L) 03/02/2014 1411   GFRNONAA >60 11/11/2013 1451   GFRAA >60 06/22/2019 2151   GFRAA 56 (L) 03/02/2014 1411   GFRAA >60 11/11/2013 1451     Assessment and Plan: 1. CML (chronic myelocytic leukemia) ([place])   2. Iron deficiency   3. Encounter for antineoplastic chemotherapy     #CML, chronic phase .  Labs reviewed and discussed with patient .  She has had a recurrence.  BCR ABL 1 FISH is positive for 0.33% of nuclei, b3 a2 transcript 1.1677%. Recommend patient to resume bosutinib 400 mg daily.  She previously tolerated well.  I will check insurance for coverage if comfortable proceed. I will see patient 4 weeks for evaluation of treatment response, tolerability.  Patient agrees with the plan  #Iron deficiency, she does not have any anemia.  Recommend patient to take iron supplementation orally. #Fatigue, unknown etiology.  Flow cytometry was negative for immunophenotypic abnormality.  Vitamin B12 level is normal at 358.  Follow Up Instructions: 4 weeks after she starts the bosutinib.   I discussed the assessment and treatment plan with the patient. The patient was provided an  opportunity to ask questions and all were answered. The patient agreed with the plan and demonstrated an understanding of the instructions.  The patient was advised to call back or seek an in-person evaluation if the symptoms worsen or if the condition fails to improve as anticipated.   Earlie Server, MD 01/09/2021 11:36 PM

## 2021-01-09 NOTE — Telephone Encounter (Signed)
Oral Oncology Patient Advocate Encounter   Received notification from [facility] that prior authorization for Bosulif is required.   PA submitted on CoverMyMeds Key [id number] Status is pending   Oral Oncology Clinic will continue to follow.  [place] Patient [place] Phone [phone] Fax [phone] 01/10/2021 11:56 AM

## 2021-01-10 ENCOUNTER — Other Ambulatory Visit (HOSPITAL_COMMUNITY): Payer: Self-pay

## 2021-01-10 ENCOUNTER — Ambulatory Visit: Payer: Medicare PPO | Admitting: Oncology

## 2021-01-10 ENCOUNTER — Telehealth: Payer: Self-pay | Admitting: Pharmacist

## 2021-01-10 DIAGNOSIS — C9210 Chronic myeloid leukemia, BCR/ABL-positive, not having achieved remission: Secondary | ICD-10-CM

## 2021-01-10 MED ORDER — BOSUTINIB 400 MG PO TABS
400.0000 mg | ORAL_TABLET | Freq: Every day | ORAL | 1 refills | Status: DC
  Filled 2021-01-10 – 2021-01-11 (×2): qty 30, 30d supply, fill #0

## 2021-01-10 NOTE — Telephone Encounter (Signed)
Oral Oncology Patient Advocate Encounter  Prior Authorization for Bosulif has been approved.    PA# [id number] Effective dates: 01/10/21 through 02/24/22  Patients co-pay is $2972.61.  Oral Oncology Clinic will continue to follow.   [place] Patient [place] Phone [phone] Fax [phone] 01/10/2021 11:57 AM

## 2021-01-10 NOTE — Telephone Encounter (Signed)
Oral Oncology Pharmacist Encounter  Received new prescription for Bosulif (bosutinib) for the treatment of CML (diagnosed in 2011), planned duration until disease progression or unacceptable drug toxicity. Patient was started on bosutinib in 2015 but has been in and out of care and on/off treatment since then, most recently MD reported she has been off treatment for ~2 years. She was seen by Dr. Tasia Catchings on 12/27/20 and reported restarting her bosutinib about a month before but last taking it about a week ago. She restarted herself using medication she had at home.   CBC/CMP from 12/27/20 assessed, no relevant lab abnormalities. Prescription dose and frequency assessed.   Current medication list in Epic reviewed, one DDIs with bosutinib identified: Omeprazole: PPIs may decrease the serum concentration of bosutinib. Recommended not to be used in combination. Suggest switching patient to antacids or H2 antagonists (such as famotidine) if acid suppression is needed. antacids or H2 antagonists should be separated from bosutinib by at least 2 hours.  Evaluated chart and one patient barriers to medication adherence identified. Patient is noted by MD to be lost to follow-up several times over the years and non-adherent to her CML medication.  Prescription has been [facility]scribed to [facility] for benefits analysis and approval.  Oral Oncology Clinic will continue to follow for insurance authorization, copayment issues, initial counseling and start date.  Patient agreed to treatment on 01/09/21 per MD documentation.  Darl Pikes, PharmD, BCPS, BCOP, CPP Hematology/Oncology Clinical Pharmacist Practitioner [facility] Oral [facility] [phone]  01/10/2021 11:12 AM

## 2021-01-11 ENCOUNTER — Other Ambulatory Visit (HOSPITAL_COMMUNITY): Payer: Self-pay

## 2021-01-11 NOTE — Telephone Encounter (Signed)
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 Patient was started on bosutinib in 2015 but has been in and out of care and on/off treatment since then, most recently MD reported she has been off treatment for ~2 years. She was seen by Dr. Tasia Catchings on 12/27/20 and reported restarting her bosutinib about a month before but last taking it about a week ago. She restarted herself using medication she had at home.  Pt is doing well. Counseled patient on administration, dosing, side effects, monitoring, drug-food interactions, safe handling, storage, and disposal. Patient will take 1 tablet (400 mg total) by mouth daily with breakfast.  Side effects include but not limited to: nausea, rash/itching, diarrhea, fatigue.  Reviewed DDI with omeprazole, patient was hesitant to stop due to her reflux issues, she will start by alternating days with omeprazole and famotidine. She was informed that the famotidine would need to be spaced from her bosutinib by at least 2 hours.  Reviewed with patient importance of keeping a medication schedule and plan for any missed doses.  Ms. Carelli voiced understanding and appreciation. All questions answered. Medication handout provided.  Provided patient with Oral [facility] phone number. Patient knows to call the office with questions or concerns. Oral Chemotherapy Navigation Clinic will continue to follow.  Darl Pikes, PharmD, BCPS, BCOP, CPP Hematology/Oncology Clinical Pharmacist Practitioner [facility] [place] [facility] [phone]  01/11/2021 1:30 PM

## 2021-01-12 ENCOUNTER — Telehealth: Payer: Self-pay

## 2021-01-12 ENCOUNTER — Other Ambulatory Visit (HOSPITAL_COMMUNITY): Payer: Self-pay

## 2021-01-12 NOTE — Telephone Encounter (Signed)
-----   Message from Sheliah Hatch, CPhT sent at 01/09/2021  3:41 PM EST ----- We will start working on the PA!  Bethena Roys ----- Message ----- From: Earlie Server, MD Sent: 01/09/2021   3:12 PM EST To: Sheliah Hatch, CPhT, Evelina Dun, RN, #  Hi team, I plan to start her bosutinib 400mg  daily. She was on that previously.  Please check insurance coverage, proceed if covered.  Follow up  4 weeks after she started bosutinib,  do labs-[I ordered], 1 week after lab see MD thanks.

## 2021-01-12 NOTE — Telephone Encounter (Signed)
Patient will start on Bosutinib on 01/15/21. Has been scheduled for labs on 12/19 and MD/ pharmacy on 12/27. Pt informed of appt. Appts will also be mailed.

## 2021-01-24 ENCOUNTER — Other Ambulatory Visit (HOSPITAL_COMMUNITY): Payer: Self-pay

## 2021-01-26 ENCOUNTER — Telehealth: Payer: Self-pay | Admitting: *Deleted

## 2021-01-26 NOTE — Telephone Encounter (Signed)
Patient called reporting that she has Home Health Nurse coming to her home and is asking if they could draw her labs so she does not have to come to office to get it done. Her next appointment is for lab 12/19. Please advise

## 2021-01-29 ENCOUNTER — Telehealth: Payer: Self-pay | Admitting: Oncology

## 2021-01-29 ENCOUNTER — Telehealth: Payer: Self-pay | Admitting: Pharmacy Technician

## 2021-01-29 NOTE — Telephone Encounter (Signed)
I spoke to pt about her upcoming appts, she stated that she is unable to come in and is using [facility] services. She would like to know if we could give her nurse orders for her lab work that is requested. How would you like to proceed with future appts if pt states she is unable to come in?

## 2021-01-29 NOTE — Telephone Encounter (Signed)
Contacted [facility], left message with callback number.

## 2021-01-29 NOTE — Telephone Encounter (Signed)
Spoke to pt and she is requesting to have labs drawn by her [place] from [place]. Called and left message for Nurse ([phone]) to return call and provide fax number so we can fax orders. Labs on 12/19 cancelled. Told pt that Dr. Tasia Catchings would like for her to keep in person appt on 12/27 as she is on oral chemo and would like to assess her. Pt verbalized understanding but stated that she was not sure if she would be able to make it but she would try.

## 2021-01-29 NOTE — Telephone Encounter (Signed)
Ppt called wanting to get her lab done. States that Dr. Tasia Catchings said that they woulld be ordered in a couple of weeks and pt has not heard anything. She has lab appt for 12-17. Please give her a call back at [phone]

## 2021-01-29 NOTE — Telephone Encounter (Signed)
Please reach out to pt and give her appt details.

## 2021-01-29 NOTE — Telephone Encounter (Signed)
Received call from [place], home health nurse, stating that patient was discharged from [facility] nursing on 11/30, therefore they cannot draw labs.   Please add lab prior to MD visit (same day) and notify pt of time change.

## 2021-01-31 NOTE — Telephone Encounter (Signed)
Oral Oncology Patient Advocate Encounter  Called patient to discuss completing an application for [employer] Patient Assistance in an effort to reduce patient's out of pocket expense for Bosulif to $0.    Application completed and faxed to [phone] on 01/29/21.   [employer] patient assistance phone number for follow up is [phone].   This encounter will be updated until final determination.   [place] Patient [place] Phone [phone] Fax [phone] 01/31/2021 8:23 AM

## 2021-01-31 NOTE — Telephone Encounter (Signed)
Oral Oncology Patient Advocate Encounter  Received notification from [employer] Patient Assistance program that patient has been successfully enrolled into their program to receive Bosulif from the manufacturer at $0 out of pocket until 02/24/21.    I will call the patient to let her know of the approval and that she will have to re-apply for 2023.   Specialty Pharmacy that will dispense medication is [facility].  Patient knows to call the office with questions or concerns.   Oral Oncology Clinic will continue to follow.  [place] Patient [place] Phone [phone] Fax [phone] 01/31/2021 8:23 AM

## 2021-02-12 ENCOUNTER — Other Ambulatory Visit: Payer: Medicare PPO

## 2021-02-14 ENCOUNTER — Other Ambulatory Visit: Payer: Self-pay

## 2021-02-14 DIAGNOSIS — Z1211 Encounter for screening for malignant neoplasm of colon: Secondary | ICD-10-CM

## 2021-02-14 MED ORDER — NA SULFATE-K SULFATE-MG SULF 17.5-3.13-1.6 GM/177ML PO SOLN: 1.0000 | Freq: Once | ORAL | 0 refills | Status: AC

## 2021-02-14 NOTE — Progress Notes (Signed)
Gastroenterology Pre-Procedure Review  Request Date: 03/21/2021 Requesting Physician: Dr. Marius Ditch  PATIENT REVIEW QUESTIONS: The patient responded to the following health history questions as indicated:    1. Are you having any GI issues?  Some constipation, abdominal pain. Does not need to be seen by provider. 2. Do you have a personal history of Polyps?  2014 no polyps removed. 3. Do you have a family history of Colon Cancer or Polyps? no 4. Diabetes Mellitus? yes (Type II) 5. Joint replacements in the past 12 months?no 6. Major health problems in the past 3 months?yes (lower back surgery in August.) 7. Any artificial heart valves, MVP, or defibrillator?no    MEDICATIONS & ALLERGIES:    Patient reports the following regarding taking any anticoagulation/antiplatelet therapy:   Plavix, Coumadin, Eliquis, Xarelto, Lovenox, Pradaxa, Brilinta, or Effient? no Aspirin? no  Patient confirms/reports the following medications:  Current Outpatient Medications  Medication Sig Dispense Refill   ACCU-CHEK GUIDE test strip TEST ONCE DAILY AND AS DIRECTED.  3   ACCU-CHEK SOFTCLIX LANCETS lancets Use 1 each 3 (three) times daily. Use as instructed.     albuterol (PROVENTIL HFA;VENTOLIN HFA) 108 (90 BASE) MCG/ACT inhaler Inhale 2 puffs into the lungs every 6 (six) hours as needed for wheezing or shortness of breath.     albuterol (PROVENTIL) (2.5 MG/3ML) 0.083% nebulizer solution INHALE CONTENTS OF 1 VIAL VIA NEBULIZER THREE TIMES A DAY prn     ALPRAZolam (XANAX) 1 MG tablet TAKE ONE TABLET BY MOUTH THREE TIMES DAILY 90 tablet 5   ALPRAZolam (XANAX) 1 MG tablet Take 1 tablet (1 mg total) by mouth 3 (three) times daily. 90 tablet 5   Ascorbic Acid (VITAMIN C) 1000 MG tablet Take 3,000 mg by mouth daily.     Blood Glucose Monitoring Suppl (GLUCOCOM BLOOD GLUCOSE MONITOR) DEVI Use as directed.     bosutinib (BOSULIF) 400 MG tablet Take 1 tablet (400 mg total) by mouth daily with breakfast. 30 tablet 1    cetirizine (ZYRTEC) 10 MG tablet Take 20 mg by mouth daily as needed.      Cholecalciferol (VITAMIN D PO) Take 1 tablet by mouth daily.     co-enzyme Q-10 50 MG capsule Take 50 mg by mouth daily.     diphenhydrAMINE (BENADRYL) 25 MG tablet Take 25 mg by mouth every 6 (six) hours as needed for itching or allergies.      DULoxetine (CYMBALTA) 20 MG capsule Take 1 capsule (20 mg total) by mouth in the morning, at noon, in the evening, and at bedtime. 120 capsule 5   fluticasone (FLONASE) 50 MCG/ACT nasal spray Place 1-2 sprays into both nostrils as needed for rhinitis.      furosemide (LASIX) 40 MG tablet Take 1 tablet (40 mg total) by mouth daily. 30 tablet 3   insulin aspart (NOVOLOG) 100 UNIT/ML injection Inject 2-8 Units into the skin 3 (three) times daily as needed for high blood sugar. Pt reports she is taking 2-8 units per sliding scale based on glucose     insulin glargine (LANTUS) 100 UNIT/ML injection Inject 0.37 mLs (37 Units total) into the skin daily. (Patient taking differently: Inject 20 Units into the skin daily.) 10 mL 11   losartan (COZAAR) 100 MG tablet Take 1 tablet (100 mg total) by mouth daily. 30 tablet 0   montelukast (SINGULAIR) 10 MG tablet Take 1 tablet (10 mg total) by mouth at bedtime. 30 tablet 5   Olopatadine HCl 0.2 % SOLN Place 1 drop  into both eyes daily.     Omega-3 Fatty Acids (OMEGA 3 PO) Take 3 capsules by mouth daily.     omeprazole (PRILOSEC) 40 MG capsule Take 40 mg by mouth daily. Reported on 04/21/2015     polyethylene glycol (MIRALAX MIX-IN PAX) 17 g packet Take 17 g by mouth daily as needed. (Patient not taking: Reported on 01/09/2021) 14 each 0   pregabalin (LYRICA) 300 MG capsule TAKE 1 CAPSULE BY MOUTH TWICE A DAY 60 capsule 5   pregabalin (LYRICA) 300 MG capsule Take 1 capsule (300 mg total) by mouth 2 (two) times daily. 60 capsule 5   traZODone (DESYREL) 150 MG tablet Take 1 tablet (150 mg total) by mouth at bedtime. 30 tablet 5   vitamin B-12  (CYANOCOBALAMIN) 1000 MCG tablet Take 4,000 mcg by mouth daily.     No current facility-administered medications for this visit.    Patient confirms/reports the following allergies:  Allergies  Allergen Reactions   Morphine Rash    Headache too   Codeine Other (See Comments)    Reaction: Unknown    Latex Other (See Comments)    Reaction:  Unknown    Morphine And Related Other (See Comments)    Reaction:  Unknown    Relafen [Nabumetone] Hives   Nsaids Rash   Tape Other (See Comments) and Rash    Reaction:  Unknown     No orders of the defined types were placed in this encounter.   AUTHORIZATION INFORMATION Primary Insurance: 1D#: Group #:  Secondary Insurance: 1D#: Group #:  SCHEDULE INFORMATION: Date: 03/21/2021 Time: Location: [place]

## 2021-02-15 ENCOUNTER — Telehealth: Payer: Self-pay | Admitting: Pharmacist

## 2021-02-15 NOTE — Telephone Encounter (Signed)
Oral Chemotherapy Pharmacist Encounter   Called patient to reminder her to call [facility] to request a refill of her Bosulif. Provider phone number for [facility] to Ms. Trena Platt.   Darl Pikes, PharmD, BCPS, BCOP, CPP Hematology/Oncology Clinical Pharmacist [place]/[facility]/AP Oral [place] [facility] [phone]  02/15/2021 1:55 PM

## 2021-02-20 ENCOUNTER — Other Ambulatory Visit: Payer: Self-pay

## 2021-02-20 ENCOUNTER — Inpatient Hospital Stay: Payer: Medicare PPO | Attending: Oncology | Admitting: Oncology

## 2021-02-20 ENCOUNTER — Inpatient Hospital Stay: Payer: Medicare PPO

## 2021-02-20 ENCOUNTER — Inpatient Hospital Stay: Payer: Medicare PPO | Admitting: Pharmacist

## 2021-02-20 ENCOUNTER — Encounter: Payer: Self-pay | Admitting: Oncology

## 2021-02-20 VITALS — BP 113/58 | HR 68 | Temp 97.1°F

## 2021-02-20 DIAGNOSIS — F419 Anxiety disorder, unspecified: Secondary | ICD-10-CM | POA: Diagnosis not present

## 2021-02-20 DIAGNOSIS — Z5111 Encounter for antineoplastic chemotherapy: Secondary | ICD-10-CM

## 2021-02-20 DIAGNOSIS — C9210 Chronic myeloid leukemia, BCR/ABL-positive, not having achieved remission: Secondary | ICD-10-CM | POA: Insufficient documentation

## 2021-02-20 DIAGNOSIS — R5383 Other fatigue: Secondary | ICD-10-CM | POA: Diagnosis not present

## 2021-02-20 DIAGNOSIS — E611 Iron deficiency: Secondary | ICD-10-CM | POA: Insufficient documentation

## 2021-02-20 DIAGNOSIS — R61 Generalized hyperhidrosis: Secondary | ICD-10-CM | POA: Insufficient documentation

## 2021-02-20 DIAGNOSIS — F329 Major depressive disorder, single episode, unspecified: Secondary | ICD-10-CM | POA: Diagnosis not present

## 2021-02-20 DIAGNOSIS — E1165 Type 2 diabetes mellitus with hyperglycemia: Secondary | ICD-10-CM | POA: Diagnosis not present

## 2021-02-20 LAB — COMPREHENSIVE METABOLIC PANEL
ALT: 32 U/L (ref 0–44)
AST: 29 U/L (ref 15–41)
Albumin: 3.9 g/dL (ref 3.5–5.0)
Alkaline Phosphatase: 84 U/L (ref 38–126)
Anion gap: 10 (ref 5–15)
BUN: 16 mg/dL (ref 8–23)
CO2: 28 mmol/L (ref 22–32)
Calcium: 9.3 mg/dL (ref 8.9–10.3)
Chloride: 96 mmol/L — ABNORMAL LOW (ref 98–111)
Creatinine, Ser: 0.96 mg/dL (ref 0.44–1.00)
GFR, Estimated: >60 mL/min (ref >60)
Glucose, Bld: 193 mg/dL — ABNORMAL HIGH (ref 70–99)
Potassium: 3.8 mmol/L (ref 3.5–5.1)
Sodium: 134 mmol/L — ABNORMAL LOW (ref 135–145)
Total Bilirubin: 0.6 mg/dL (ref 0.3–1.2)
Total Protein: 7.4 g/dL (ref 6.5–8.1)

## 2021-02-20 LAB — RETIC PANEL
Immature Retic Fract: 12.1 % (ref 2.3–15.9)
RBC.: 4.95 MIL/uL (ref 3.87–5.11)
Retic Count, Absolute: 78.2 10*3/uL (ref 19.0–186.0)
Retic Ct Pct: 1.6 % (ref 0.4–3.1)
Reticulocyte Hemoglobin: 32.1 pg (ref >27.9)

## 2021-02-20 LAB — CBC WITH DIFFERENTIAL/PLATELET
Abs Immature Granulocytes: 0.07 10*3/uL (ref 0.00–0.07)
Basophils Absolute: 0 10*3/uL (ref 0.0–0.1)
Basophils Relative: 0 %
Eosinophils Absolute: 0.1 10*3/uL (ref 0.0–0.5)
Eosinophils Relative: 2 %
HCT: 40.2 % (ref 36.0–46.0)
Hemoglobin: 12.9 g/dL (ref 12.0–15.0)
Immature Granulocytes: 1 %
Lymphocytes Relative: 15 %
Lymphs Abs: 1 10*3/uL (ref 0.7–4.0)
MCH: 26.2 pg (ref 26.0–34.0)
MCHC: 32.1 g/dL (ref 30.0–36.0)
MCV: 81.7 fL (ref 80.0–100.0)
Monocytes Absolute: 0.4 10*3/uL (ref 0.1–1.0)
Monocytes Relative: 6 %
Neutro Abs: 5.1 10*3/uL (ref 1.7–7.7)
Neutrophils Relative %: 76 %
Platelets: 287 10*3/uL (ref 150–400)
RBC: 4.92 MIL/uL (ref 3.87–5.11)
RDW: 15.2 % (ref 11.5–15.5)
WBC: 6.8 10*3/uL (ref 4.0–10.5)
nRBC: 0 % (ref 0.0–0.2)

## 2021-02-20 LAB — FERRITIN: Ferritin: 27 ng/mL (ref 11–307)

## 2021-02-20 LAB — IRON AND TIBC
Iron: 37 ug/dL (ref 28–170)
Saturation Ratios: 10 % — ABNORMAL LOW (ref 10.4–31.8)
TIBC: 386 ug/dL (ref 250–450)
UIBC: 349 ug/dL

## 2021-02-20 LAB — HEMOGLOBIN A1C
Hgb A1c MFr Bld: 9.4 % — ABNORMAL HIGH (ref 4.8–5.6)
Mean Plasma Glucose: 223.08 mg/dL

## 2021-02-20 NOTE — Progress Notes (Signed)
Hematology/Oncology progress note  [phone] [phone]      Clinic Day:  02/20/2021  Referring physician: Kerri Perches, PA-C  Chief Complaint: Tara Levine is a 65 y.o. female presents for follow up of chronic myelogenous leukemia (CML)   PERTINENT ONCOLOGY HISTORY Patient previously followed up by Tara Levine and Tara Levine, patient switched care to me on 01/12/21 Extensive medical record review was performed by me  Patient was initially seen by Tara Levine at and Tara Levine.  She was seen by Tara Levine on 12/28/2018. CML was diagnosed in 2011. Bone marrow on 11/2009 revealed CML in chronic phase with no increase in blast (4%).  B3a2 transcript was 60% (p210).   11/2009, patient was started on nilotinib 300 mg twice daily.  Because of weight gain and intolerance to neratinib, dose was reduced to 150 mg twice daily. 12/2011 BCR ABL IS 0.11% 02/2012 nilotinib was increased to 600 mg twice daily 05/2012 patient was intubated in ICU for possible aspiration, narcotic effects, cardiac arrest and septic shock. nilotinib was held.  Restarted at a dose of 200 mg p.o. daily with dose escalation beginning May 2014. 08/2012 BCR ABL 0.55%  03/2013 patient was switched to bosutinib 400 mg daily. Patient was noticed to have medical noncompliance, missing multiple appointments. Concern was raised about an increasing BCR ABL transcript.  06/12/2017, patient was seen by [facility] Tara Levine for second opinion. Per note, patient continued bosutinib and to she lost her insurance in July 2019. BCR-ABLR was < 0.0032% therefore Tara Levine recommend patient to stay off bosutinib.  Patient then lost follow-up after that appointment.  Patient has psychiatric issues, major depression and anxiety..  She is very anxious in the clinic today.  Accompanied by her Sister Tara Levine. Patient reports extremely fatigued lately.  Also night sweats.  She restarted bosutinib using leftover  supply and stayed on the medication for months.  Last taking bosutinib a week ago. She lives at home by herself.  Widowed 10/15/2020, hemoglobin 7.5, MCV 89.1.  White count 6.0.  Platelet 267. Creatinine 1.44   INTERVAL HISTORY Tara Levine is a 65 y.o. female who has above history reviewed by me today presents for follow up visit for management of CML Patient has been restarted on bosutinib 400 mg daily.  She tolerates well.  She ran out of her supply and last dose was about a week ago. Patient reports feeling more fatigued.   Past Medical History:  Diagnosis Date   CML (chronic myelocytic leukemia) ([place])    Depression    DM type 2 (diabetes mellitus, type 2) ([place])    Environmental allergies    History of gastric ulcer    HLD (hyperlipidemia)    IDA (iron deficiency anemia)    chronic   Leukemia (HCC)    Leukemia, chronic myeloid (HCC)    Osteoarthritis    Panic disorder    PSS (progressive systemic sclerosis) ([place])    Unspecified essential hypertension     Past Surgical History:  Procedure Laterality Date   back and neck surgery  1999, 2009   had rods placed   [place] [facility]) No blackages found.    CARPAL TUNNEL RELEASE     PARTIAL HYSTERECTOMY     age 50, no cancer    Family History  Problem Relation Age of Onset   Diabetes Mother    Hypertension Mother    Heart disease Mother    Prostate cancer Father  Heart disease Father    Diabetes Brother     Social History:  reports that she has never smoked. She has never used smokeless tobacco. She reports that she does not drink alcohol and does not use drugs.  Patient is widowed.  Lives by herself.  Allergies:  Allergies  Allergen Reactions   Morphine Rash    Headache too   Codeine Other (See Comments)    Reaction: Unknown    Latex Other (See Comments)    Reaction:  Unknown    Morphine And Related Other (See Comments)    Reaction:  Unknown    Relafen  [Nabumetone] Hives   Nsaids Rash   Tape Other (See Comments) and Rash    Reaction:  Unknown     Current Medications: Current Outpatient Medications  Medication Sig Dispense Refill   ACCU-CHEK GUIDE test strip TEST ONCE DAILY AND AS DIRECTED.  3   ACCU-CHEK SOFTCLIX LANCETS lancets Use 1 each 3 (three) times daily. Use as instructed.     albuterol (PROVENTIL HFA;VENTOLIN HFA) 108 (90 BASE) MCG/ACT inhaler Inhale 2 puffs into the lungs every 6 (six) hours as needed for wheezing or shortness of breath.     albuterol (PROVENTIL) (2.5 MG/3ML) 0.083% nebulizer solution INHALE CONTENTS OF 1 VIAL VIA NEBULIZER THREE TIMES A DAY prn     ALPRAZolam (XANAX) 1 MG tablet TAKE ONE TABLET BY MOUTH THREE TIMES DAILY 90 tablet 5   Ascorbic Acid (VITAMIN C) 1000 MG tablet Take 3,000 mg by mouth daily.     Blood Glucose Monitoring Suppl (GLUCOCOM BLOOD GLUCOSE MONITOR) DEVI Use as directed.     bosutinib (BOSULIF) 400 MG tablet Take 1 tablet (400 mg total) by mouth daily with breakfast. 30 tablet 1   cetirizine (ZYRTEC) 10 MG tablet Take 20 mg by mouth daily as needed.      Cholecalciferol (VITAMIN D PO) Take 1 tablet by mouth daily.     co-enzyme Q-10 50 MG capsule Take 50 mg by mouth daily.     diphenhydrAMINE (BENADRYL) 25 MG tablet Take 25 mg by mouth every 6 (six) hours as needed for itching or allergies.      DULoxetine (CYMBALTA) 20 MG capsule Take 1 capsule (20 mg total) by mouth in the morning, at noon, in the evening, and at bedtime. 120 capsule 5   fluticasone (FLONASE) 50 MCG/ACT nasal spray Place 1-2 sprays into both nostrils as needed for rhinitis.      furosemide (LASIX) 40 MG tablet Take 1 tablet (40 mg total) by mouth daily. 30 tablet 3   insulin aspart (NOVOLOG) 100 UNIT/ML injection Inject 2-8 Units into the skin 3 (three) times daily as needed for high blood sugar. Pt reports she is taking 2-8 units per sliding scale based on glucose     losartan (COZAAR) 100 MG tablet Take 1 tablet (100 mg  total) by mouth daily. 30 tablet 0   montelukast (SINGULAIR) 10 MG tablet Take 1 tablet (10 mg total) by mouth at bedtime. 30 tablet 5   Olopatadine HCl 0.2 % SOLN Place 1 drop into both eyes daily.     Omega-3 Fatty Acids (OMEGA 3 PO) Take 3 capsules by mouth daily.     omeprazole (PRILOSEC) 40 MG capsule Take 40 mg by mouth daily. Reported on 04/21/2015     pregabalin (LYRICA) 300 MG capsule TAKE 1 CAPSULE BY MOUTH TWICE A DAY 60 capsule 5   vitamin B-12 (CYANOCOBALAMIN) 1000 MCG tablet Take 4,000 mcg by  mouth daily.     ALPRAZolam (XANAX) 1 MG tablet Take 1 tablet (1 mg total) by mouth 3 (three) times daily. (Patient not taking: Reported on 02/20/2021) 90 tablet 5   insulin glargine (LANTUS) 100 UNIT/ML injection Inject 0.37 mLs (37 Units total) into the skin daily. (Patient not taking: Reported on 02/20/2021) 10 mL 11   polyethylene glycol (MIRALAX MIX-IN PAX) 17 g packet Take 17 g by mouth daily as needed. (Patient not taking: Reported on 01/09/2021) 14 each 0   pregabalin (LYRICA) 300 MG capsule Take 1 capsule (300 mg total) by mouth 2 (two) times daily. (Patient not taking: Reported on 02/20/2021) 60 capsule 5   traZODone (DESYREL) 150 MG tablet Take 1 tablet (150 mg total) by mouth at bedtime. (Patient not taking: Reported on 02/20/2021) 30 tablet 5   No current facility-administered medications for this visit.    Review of Systems  Constitutional:  Positive for malaise/fatigue. Negative for chills, fever and weight loss.       She does not feel good.  HENT:  Negative for congestion, ear pain, hearing loss, nosebleeds, sinus pain, sore throat and tinnitus.   Eyes: Negative.  Negative for blurred vision, double vision and photophobia.  Respiratory:  Negative for cough, hemoptysis, sputum production, shortness of breath and wheezing.   Cardiovascular:  Negative for chest pain, palpitations and PND.       CHF.  Gastrointestinal: Negative.  Negative for blood in stool, diarrhea, nausea and  vomiting.  Genitourinary:  Negative for dysuria, hematuria and urgency.       Stress incontinence.  Musculoskeletal:  Positive for back pain (rods in spine) and joint pain (knees, shoulders, ankles, wrists). Negative for myalgias.       Scoliosis.  Skin:  Negative for rash.  Neurological:  Positive for sensory change (numbness in feet on Lyrica). Negative for dizziness, tremors, speech change, seizures, weakness and headaches. Focal weakness: issues with balance. Endo/Heme/Allergies: Negative.  Does not bruise/bleed easily.  Psychiatric/Behavioral:  Positive for depression and memory loss (poor short term memory). Negative for substance abuse and suicidal ideas.        Tearful because of health condition.  All other systems reviewed and are negative. Performance status (ECOG): 1  Vitals Blood pressure (!) 113/58, pulse 68, temperature (!) 97.1 F (36.2 C), SpO2 98 %.  Physical Exam Vitals and nursing note reviewed.  Constitutional:      General: She is not in acute distress.    Appearance: She is well-developed. She is obese. She is not diaphoretic.  HENT:     Head: Normocephalic and atraumatic.  Eyes:     General: No scleral icterus.    Conjunctiva/sclera: Conjunctivae normal.  Cardiovascular:     Rate and Rhythm: Normal rate and regular rhythm.  Pulmonary:     Effort: Pulmonary effort is normal. No respiratory distress.     Breath sounds: No wheezing.  Abdominal:     Palpations: Abdomen is soft.  Musculoskeletal:        General: No swelling. Normal range of motion.     Cervical back: Normal range of motion.  Skin:    General: Skin is warm.  Neurological:     Mental Status: She is alert and oriented to person, place, and time.  Psychiatric:        Mood and Affect: Mood normal.    Appointment on 02/20/2021  Component Date Value Ref Range Status   Retic Ct Pct 02/20/2021 1.6  0.4 - 3.1 %  Final   RBC. 02/20/2021 4.95  3.87 - 5.11 MIL/uL Final   Retic Count, Absolute  02/20/2021 78.2  19.0 - 186.0 K/uL Final   Immature Retic Fract 02/20/2021 12.1  2.3 - 15.9 % Final   Reticulocyte Hemoglobin 02/20/2021 32.1  >27.9 pg Final   Comment:        Given the high negative predictive value of a RET-He result > 32 pg iron deficiency is essentially excluded. If this patient is anemic other etiologies should be considered. Performed at [facility], [address] [place], [place] [address]    WBC 02/20/2021 6.8  4.0 - 10.5 K/uL Final   RBC 02/20/2021 4.92  3.87 - 5.11 MIL/uL Final   Hemoglobin 02/20/2021 12.9  12.0 - 15.0 g/dL Final   HCT 02/20/2021 40.2  36.0 - 46.0 % Final   MCV 02/20/2021 81.7  80.0 - 100.0 fL Final   MCH 02/20/2021 26.2  26.0 - 34.0 pg Final   MCHC 02/20/2021 32.1  30.0 - 36.0 g/dL Final   RDW 02/20/2021 15.2  11.5 - 15.5 % Final   Platelets 02/20/2021 287  150 - 400 K/uL Final   nRBC 02/20/2021 0.0  0.0 - 0.2 % Final   Neutrophils Relative % 02/20/2021 76  % Final   Neutro Abs 02/20/2021 5.1  1.7 - 7.7 K/uL Final   Lymphocytes Relative 02/20/2021 15  % Final   Lymphs Abs 02/20/2021 1.0  0.7 - 4.0 K/uL Final   Monocytes Relative 02/20/2021 6  % Final   Monocytes Absolute 02/20/2021 0.4  0.1 - 1.0 K/uL Final   Eosinophils Relative 02/20/2021 2  % Final   Eosinophils Absolute 02/20/2021 0.1  0.0 - 0.5 K/uL Final   Basophils Relative 02/20/2021 0  % Final   Basophils Absolute 02/20/2021 0.0  0.0 - 0.1 K/uL Final   Immature Granulocytes 02/20/2021 1  % Final   Abs Immature Granulocytes 02/20/2021 0.07  0.00 - 0.07 K/uL Final   Performed at [facility], [address] [place], [place] [address]   Ferritin 02/20/2021 27  11 - 307 ng/mL Final   Performed at [facility], [address] [place], [place] [address]   Sodium 02/20/2021 134 (L)  135 - 145 mmol/L Final   Potassium 02/20/2021 3.8  3.5 - 5.1 mmol/L Final   Chloride 02/20/2021 96 (L)  98 - 111 mmol/L Final   CO2 02/20/2021 28  22 - 32 mmol/L Final    Glucose, Bld 02/20/2021 193 (H)  70 - 99 mg/dL Final   Glucose reference range applies only to samples taken after fasting for at least 8 hours.   BUN 02/20/2021 16  8 - 23 mg/dL Final   Creatinine, Ser 02/20/2021 0.96  0.44 - 1.00 mg/dL Final   Calcium 02/20/2021 9.3  8.9 - 10.3 mg/dL Final   Total Protein 02/20/2021 7.4  6.5 - 8.1 g/dL Final   Albumin 02/20/2021 3.9  3.5 - 5.0 g/dL Final   AST 02/20/2021 29  15 - 41 U/L Final   ALT 02/20/2021 32  0 - 44 U/L Final   Alkaline Phosphatase 02/20/2021 84  38 - 126 U/L Final   Total Bilirubin 02/20/2021 0.6  0.3 - 1.2 mg/dL Final   GFR, Estimated 02/20/2021 >60  >60 mL/min Final   Comment: (NOTE) Calculated using the CKD-EPI Creatinine Equation (2021)    Anion gap 02/20/2021 10  5 - 15 Final   Performed at [facility], [address] [place], [place] [address]   Iron 02/20/2021 37  28 - 170 ug/dL Final   TIBC 02/20/2021 386  250 - 450 ug/dL Final   Saturation Ratios 02/20/2021 10 (L)  10.4 - 31.8 % Final   UIBC 02/20/2021 349  ug/dL Final   Performed at [facility], [place], [place] [address]   Hgb A1c MFr Bld 02/20/2021 9.4 (H)  4.8 - 5.6 % Final   Comment: (NOTE) Pre diabetes:          5.7%-6.4%  Diabetes:              >6.4%  Glycemic control for   <7.0% adults with diabetes    Mean Plasma Glucose 02/20/2021 223.08  mg/dL Final   Performed at [facility], [place] [address] [place], [place] [address]    Assessment and plan 1. CML (chronic myelocytic leukemia) ([place])   2. Encounter for antineoplastic chemotherapy   3. Other fatigue    #CML Resumed on bosutinib 400 mg daily since 01/09/2021  Labs reviewed and discussed with patient.  Patient has stable hemoglobin and white count. Continue current regimen.  Patient currently out of supply.  We will communicate with pharmacist Alyson regarding her supply. BCR ABL 1 FISH and QT PCR levels are pending.  #Uncontrolled diabetes.  A1c was  checked and results are pending.  We will send result to patient's primary care provider  #Fatigue, unknown etiology.  Likely multifactorial.  I discussed the assessment and treatment plan with the patient.  The patient was provided an opportunity to ask questions and all were answered.  The patient agreed with the plan and demonstrated an understanding of the instructions.  The patient was advised to call back if the symptoms worsen or if the condition fails to improve as anticipated. Follow-up in 6 weeks.  Earlie Server, MD, PhD  02/20/2021

## 2021-02-20 NOTE — Progress Notes (Signed)
Patient here for follow up

## 2021-02-23 LAB — BCR-ABL1 FISH
Cells Analyzed: 200
Cells Counted: 200

## 2021-02-25 LAB — BCR-ABL1, CML/ALL, PCR, QUANT: b3a2 transcript: 0.27 %

## 2021-03-16 ENCOUNTER — Other Ambulatory Visit: Payer: Self-pay | Admitting: Orthopaedic Surgery

## 2021-03-16 DIAGNOSIS — M542 Cervicalgia: Secondary | ICD-10-CM

## 2021-03-19 ENCOUNTER — Telehealth: Payer: Self-pay | Admitting: Gastroenterology

## 2021-03-19 NOTE — Telephone Encounter (Signed)
Inbound call from pt a call back stating that she never received her prep. [facility] [place]. She never stated the address for the pharmacy. Thank you.

## 2021-03-20 ENCOUNTER — Other Ambulatory Visit: Payer: Self-pay | Admitting: Gastroenterology

## 2021-03-20 ENCOUNTER — Other Ambulatory Visit: Payer: Self-pay

## 2021-03-20 MED ORDER — NA SULFATE-K SULFATE-MG SULF 17.5-3.13-1.6 GM/177ML PO SOLN
1.0000 | Freq: Once | ORAL | 0 refills | Status: DC
Start: 2021-03-20 — End: 2021-03-20

## 2021-03-20 NOTE — Progress Notes (Signed)
Prep has been sent to a different pharmacy[facility] [place].

## 2021-03-20 NOTE — Telephone Encounter (Signed)
Informed patient prep was sent to pharmacy back in December. Sent Sup[facility] to a [facility].

## 2021-03-21 ENCOUNTER — Encounter: Admission: RE | Payer: Self-pay | Source: Home / Self Care

## 2021-03-21 ENCOUNTER — Ambulatory Visit: Admission: RE | Admit: 2021-03-21 | Payer: Medicare PPO | Source: Home / Self Care | Admitting: Gastroenterology

## 2021-03-21 SURGERY — COLONOSCOPY WITH PROPOFOL
Anesthesia: General

## 2021-03-29 ENCOUNTER — Other Ambulatory Visit: Payer: Self-pay

## 2021-03-29 ENCOUNTER — Telehealth: Payer: Self-pay

## 2021-03-29 ENCOUNTER — Telehealth (INDEPENDENT_AMBULATORY_CARE_PROVIDER_SITE_OTHER): Payer: Medicare PPO | Admitting: Psychiatry

## 2021-03-29 DIAGNOSIS — F4310 Post-traumatic stress disorder, unspecified: Secondary | ICD-10-CM

## 2021-03-29 DIAGNOSIS — F3130 Bipolar disorder, current episode depressed, mild or moderate severity, unspecified: Secondary | ICD-10-CM

## 2021-03-29 MED ORDER — PREGABALIN 300 MG PO CAPS: 300.0000 mg | ORAL_CAPSULE | Freq: Two times a day (BID) | ORAL | 5 refills | Status: DC

## 2021-03-29 MED ORDER — DULOXETINE HCL 30 MG PO CPEP: 30.0000 mg | ORAL_CAPSULE | Freq: Every day | ORAL | 5 refills | Status: DC

## 2021-03-29 MED ORDER — ALPRAZOLAM 1 MG PO TABS: 1.0000 mg | ORAL_TABLET | Freq: Three times a day (TID) | ORAL | 5 refills | Status: DC

## 2021-03-29 MED ORDER — DULOXETINE HCL 20 MG PO CPEP: 20.0000 mg | ORAL_CAPSULE | Freq: Four times a day (QID) | ORAL | 5 refills | Status: DC

## 2021-03-29 NOTE — Telephone Encounter (Signed)
[email or web address] prior auth needed for the duloxetine hcl 20mg  capsule.  went online and completed the prior authl- pending

## 2021-03-29 NOTE — Progress Notes (Signed)
Virtual Visit via Telephone Note  I connected with Tara Levine on 03/29/21 at  1:00 PM EST by telephone and verified that I am speaking with the correct person using two identifiers.  Location: Patient: Home Provider: Hospital   I discussed the limitations, risks, security and privacy concerns of performing an evaluation and management service by telephone and the availability of in person appointments. I also discussed with the patient that there may be a patient responsible charge related to this service. The patient expressed understanding and agreed to proceed.   History of Present Illness: Patient reached by telephone.  Patient entirely focused at seams on her physical pain.  Wanted to talk about increasing dose of Lyricaor making other adjustments to medicine.  Mood still anxious and depressed but denies suicidal ideation.  No evidence psychosis.    Observations/Objective: Anxious and dysphoric but lucid.  Rapid speech but not pressured.  Does not appear to be psychotic.  Denies suicidal plan   Assessment and Plan: Explained to the patient that she is at the highest dose of Lyrica that I can prescribe.  No change to Lyrica.  We will try increasing her Cymbalta slightly by adding an extra 30 mg at nighttime.  No change to other medicine.  Discontinue trazodone if she is not taking it.  Follow-up 6 months   Follow Up Instructions:    I discussed the assessment and treatment plan with the patient. The patient was provided an opportunity to ask questions and all were answered. The patient agreed with the plan and demonstrated an understanding of the instructions.   The patient was advised to call back or seek an in-person evaluation if the symptoms worsen or if the condition fails to improve as anticipated.  I provided 20 minutes of non-face-to-face time during this encounter.   Alethia Berthold, MD

## 2021-03-30 ENCOUNTER — Ambulatory Visit: Payer: Medicare PPO

## 2021-03-30 NOTE — Telephone Encounter (Signed)
prior auth approved until 02-24-22

## 2021-04-02 ENCOUNTER — Inpatient Hospital Stay: Payer: Medicare PPO | Admitting: Oncology

## 2021-04-02 ENCOUNTER — Inpatient Hospital Stay: Payer: Medicare PPO

## 2021-04-05 ENCOUNTER — Telehealth: Payer: Self-pay | Admitting: Pharmacy Technician

## 2021-04-11 ENCOUNTER — Inpatient Hospital Stay: Payer: Medicare PPO | Admitting: Oncology

## 2021-04-11 ENCOUNTER — Inpatient Hospital Stay: Payer: Medicare PPO

## 2021-04-12 ENCOUNTER — Other Ambulatory Visit: Payer: Self-pay | Admitting: Chiropractic Medicine

## 2021-04-12 DIAGNOSIS — M75102 Unspecified rotator cuff tear or rupture of left shoulder, not specified as traumatic: Secondary | ICD-10-CM

## 2021-04-17 ENCOUNTER — Telehealth: Payer: Self-pay

## 2021-04-17 NOTE — Telephone Encounter (Signed)
Patient wanted to reschedule appointment for 04/18/21 Patient 1st wanted to reschedule next avilable is 07/04/21 she said she only wanted 04/30/21 informed patient she did not have a appointment then. she states she did not want to see [place] if it was going to be that long. Asked if she could just schedule a colonoscopy informed patient that she was having GI issues it says and she has to see the provider 1st. She states well she will cancel the appointment and  not reschedule

## 2021-04-18 ENCOUNTER — Ambulatory Visit: Payer: Medicare PPO | Admitting: Internal Medicine

## 2021-04-18 ENCOUNTER — Ambulatory Visit: Payer: Medicare PPO | Admitting: Gastroenterology

## 2021-04-23 ENCOUNTER — Other Ambulatory Visit: Payer: Self-pay | Admitting: Psychiatry

## 2021-04-23 MED ORDER — ALPRAZOLAM 1 MG PO TABS: 1.0000 mg | ORAL_TABLET | Freq: Three times a day (TID) | ORAL | 5 refills | Status: DC

## 2021-04-23 MED ORDER — DULOXETINE HCL 20 MG PO CPEP: 20.0000 mg | ORAL_CAPSULE | Freq: Four times a day (QID) | ORAL | 5 refills | Status: DC

## 2021-04-24 ENCOUNTER — Inpatient Hospital Stay: Payer: Medicare PPO | Attending: Oncology

## 2021-04-24 ENCOUNTER — Ambulatory Visit: Payer: Medicare PPO

## 2021-04-25 ENCOUNTER — Inpatient Hospital Stay: Payer: Medicare PPO

## 2021-04-27 ENCOUNTER — Telehealth: Payer: Medicare PPO | Admitting: Oncology

## 2021-04-29 ENCOUNTER — Ambulatory Visit: Admission: RE | Admit: 2021-04-29 | Payer: Medicare PPO | Source: Ambulatory Visit

## 2021-04-29 ENCOUNTER — Ambulatory Visit: Payer: Medicare PPO

## 2021-04-30 ENCOUNTER — Ambulatory Visit: Payer: Medicare PPO | Admitting: Physician Assistant

## 2021-05-02 ENCOUNTER — Encounter: Payer: Self-pay | Admitting: Oncology

## 2021-05-02 ENCOUNTER — Other Ambulatory Visit: Payer: Self-pay

## 2021-05-02 ENCOUNTER — Inpatient Hospital Stay: Payer: Medicare PPO | Attending: Oncology | Admitting: Oncology

## 2021-05-02 DIAGNOSIS — C9210 Chronic myeloid leukemia, BCR/ABL-positive, not having achieved remission: Secondary | ICD-10-CM | POA: Diagnosis not present

## 2021-05-02 DIAGNOSIS — Z5111 Encounter for antineoplastic chemotherapy: Secondary | ICD-10-CM

## 2021-05-02 NOTE — Progress Notes (Signed)
Pt contacted for mychart visit. Pt reports that she has feeling weak. She reached out to PCP but was unable to go to appointment yesterday due to her weakness.  ?

## 2021-05-02 NOTE — Progress Notes (Signed)
HEMATOLOGY-ONCOLOGY TeleHEALTH VISIT PROGRESS NOTE  I connected with Tara Levine on 05/02/21  at  3:00 PM EST by video enabled telemedicine visit and verified that I am speaking with the correct person using two identifiers. I discussed the limitations, risks, security and privacy concerns of performing an evaluation and management service by telemedicine and the availability of in-person appointments. The patient expressed understanding and agreed to proceed.   Other persons participating in the visit and their role in the encounter:  None  Patient's location: Home  Provider's location: office Chief Complaint: [name] is a 66 y.o. female who has above history reviewed by me today presents for follow up visit for management of CML Patient has rescheduled her appointment multiple times.  She requests virtual visit because she feels sick and very weak.  She feels that she was not able to lift the blanket.  She has cough, and open wound on her skin.  She reports that she had a visit with her primary care provider and had to cancel due to feeling too weak to go.  She has not done blood work prior to today's visit.  Review of Systems  Constitutional:  Positive for fatigue. Negative for chills and fever.  HENT:   Negative for hearing loss and voice change.   Eyes:  Negative for eye problems.  Respiratory:  Positive for shortness of breath. Negative for chest tightness and cough.   Cardiovascular:  Negative for chest pain.  Gastrointestinal:  Negative for abdominal distention, abdominal pain and blood in stool.  Endocrine: Negative for hot flashes.  Genitourinary:  Negative for difficulty urinating and frequency.   Musculoskeletal:  Negative for arthralgias.  Skin:  Negative for itching and rash.  Neurological:  Negative for extremity weakness.  Hematological:  Negative for adenopathy.  Psychiatric/Behavioral:  Negative for confusion. The patient is  nervous/anxious.    Past Medical History:  Diagnosis Date   CML (chronic myelocytic leukemia) ([place])    Depression    DM type 2 (diabetes mellitus, type 2) ([place])    Environmental allergies    History of gastric ulcer    HLD (hyperlipidemia)    IDA (iron deficiency anemia)    chronic   Leukemia (HCC)    Leukemia, chronic myeloid (HCC)    Osteoarthritis    Panic disorder    PSS (progressive systemic sclerosis) ([place])    Unspecified essential hypertension    Past Surgical History:  Procedure Laterality Date   back and neck surgery  1999, 2009   had rods placed   [facility]) No blackages found.    CARPAL TUNNEL RELEASE     PARTIAL HYSTERECTOMY     age 75, no cancer    Family History  Problem Relation Age of Onset   Diabetes Mother    Hypertension Mother    Heart disease Mother    Prostate cancer Father    Heart disease Father    Diabetes Brother     Social History   Socioeconomic History   Marital status: Married    Spouse name: Not on file   Number of children: Not on file   Years of education: Not on file   Highest education level: Not on file  Occupational History   Not on file  Tobacco Use   Smoking status: Never   Smokeless tobacco: Never   Tobacco comments:    quit 1985  Vaping Use  Vaping Use: Never used  Substance and Sexual Activity   Alcohol use: No    Alcohol/week: 0.0 standard drinks   Drug use: No   Sexual activity: Not Currently  Other Topics Concern   Not on file  Social History Narrative   Not on file   Social Determinants of Health   Financial Resource Strain: Not on file  Food Insecurity: Not on file  Transportation Needs: Not on file  Physical Activity: Not on file  Stress: Not on file  Social Connections: Not on file  Intimate Partner Violence: Not on file    Current Outpatient Medications on File Prior to Visit  Medication Sig Dispense Refill   ACCU-CHEK GUIDE test strip TEST  ONCE DAILY AND AS DIRECTED.  3   ACCU-CHEK SOFTCLIX LANCETS lancets Use 1 each 3 (three) times daily. Use as instructed.     albuterol (PROVENTIL HFA;VENTOLIN HFA) 108 (90 BASE) MCG/ACT inhaler Inhale 2 puffs into the lungs every 6 (six) hours as needed for wheezing or shortness of breath.     albuterol (PROVENTIL) (2.5 MG/3ML) 0.083% nebulizer solution INHALE CONTENTS OF 1 VIAL VIA NEBULIZER THREE TIMES A DAY prn     ALPRAZolam (XANAX) 1 MG tablet Take 1 tablet (1 mg total) by mouth 3 (three) times daily. 90 tablet 5   Ascorbic Acid (VITAMIN C) 1000 MG tablet Take 3,000 mg by mouth daily.     bosutinib (BOSULIF) 400 MG tablet Take 1 tablet (400 mg total) by mouth daily with breakfast. 30 tablet 1   cetirizine (ZYRTEC) 10 MG tablet Take 20 mg by mouth daily as needed.      Cholecalciferol (VITAMIN D PO) Take 1 tablet by mouth daily.     co-enzyme Q-10 50 MG capsule Take 50 mg by mouth daily.     diphenhydrAMINE (BENADRYL) 25 MG tablet Take 25 mg by mouth every 6 (six) hours as needed for itching or allergies.      DULoxetine (CYMBALTA) 20 MG capsule Take 1 capsule (20 mg total) by mouth in the morning, at noon, in the evening, and at bedtime. 120 capsule 5   DULoxetine (CYMBALTA) 30 MG capsule Take 1 capsule (30 mg total) by mouth at bedtime. 30 capsule 5   fluticasone (FLONASE) 50 MCG/ACT nasal spray Place 1-2 sprays into both nostrils as needed for rhinitis.      furosemide (LASIX) 40 MG tablet Take 1 tablet (40 mg total) by mouth daily. 30 tablet 3   insulin aspart (NOVOLOG) 100 UNIT/ML injection Inject 2-8 Units into the skin 3 (three) times daily as needed for high blood sugar. Pt reports she is taking 2-8 units per sliding scale based on glucose     insulin glargine (LANTUS) 100 UNIT/ML injection Inject 0.37 mLs (37 Units total) into the skin daily. 10 mL 11   losartan (COZAAR) 100 MG tablet Take 1 tablet (100 mg total) by mouth daily. 30 tablet 0   montelukast (SINGULAIR) 10 MG tablet Take 1  tablet (10 mg total) by mouth at bedtime. 30 tablet 5   Olopatadine HCl 0.2 % SOLN Place 1 drop into both eyes daily.     Omega-3 Fatty Acids (OMEGA 3 PO) Take 3 capsules by mouth daily.     omeprazole (PRILOSEC) 40 MG capsule Take 40 mg by mouth daily. Reported on 04/21/2015     polyethylene glycol (MIRALAX MIX-IN PAX) 17 g packet Take 17 g by mouth daily as needed. 14 each 0   pregabalin (LYRICA) 300  MG capsule TAKE 1 CAPSULE BY MOUTH TWICE A DAY 60 capsule 5   vitamin B-12 (CYANOCOBALAMIN) 1000 MCG tablet Take 4,000 mcg by mouth daily.     ALPRAZolam (XANAX) 1 MG tablet Take 1 tablet (1 mg total) by mouth 3 (three) times daily. (Patient not taking: Reported on 05/02/2021) 90 tablet 5   Blood Glucose Monitoring Suppl (GLUCOCOM BLOOD GLUCOSE MONITOR) DEVI Use as directed. (Patient not taking: Reported on 05/02/2021)     pregabalin (LYRICA) 300 MG capsule Take 1 capsule (300 mg total) by mouth 2 (two) times daily. Brand name (Patient not taking: Reported on 05/02/2021) 60 capsule 5   No current facility-administered medications on file prior to visit.    Allergies  Allergen Reactions   Morphine Rash    Headache too   Codeine Other (See Comments)    Reaction: Unknown    Latex Other (See Comments)    Reaction:  Unknown    Morphine And Related Other (See Comments)    Reaction:  Unknown    Relafen [Nabumetone] Hives   Nsaids Rash   Tape Other (See Comments) and Rash    Reaction:  Unknown        Observations/Objective: There were no vitals filed for this visit.  There is no height or weight on file to calculate BMI.  Physical Exam Neurological:     Mental Status: She is alert.    CBC    Component Value Date/Time   WBC 6.8 02/20/2021 1355   RBC 4.95 02/20/2021 1355   RBC 4.92 02/20/2021 1355   HGB 12.9 02/20/2021 1355   HGB 12.4 03/02/2014 1411   HCT 40.2 02/20/2021 1355   HCT 37.6 03/02/2014 1411   PLT 287 02/20/2021 1355   PLT 234 03/02/2014 1411   MCV 81.7 02/20/2021 1355    MCV 87 03/02/2014 1411   MCH 26.2 02/20/2021 1355   MCHC 32.1 02/20/2021 1355   RDW 15.2 02/20/2021 1355   RDW 14.3 03/02/2014 1411   LYMPHSABS 1.0 02/20/2021 1355   LYMPHSABS 1.6 03/02/2014 1411   MONOABS 0.4 02/20/2021 1355   MONOABS 0.4 03/02/2014 1411   EOSABS 0.1 02/20/2021 1355   EOSABS 0.1 03/02/2014 1411   BASOSABS 0.0 02/20/2021 1355   BASOSABS 0.0 03/02/2014 1411    CMP     Component Value Date/Time   NA 134 (L) 02/20/2021 1355   NA 136 03/02/2014 1411   K 3.8 02/20/2021 1355   K 4.0 03/02/2014 1411   CL 96 (L) 02/20/2021 1355   CL 97 (L) 03/02/2014 1411   CO2 28 02/20/2021 1355   CO2 32 03/02/2014 1411   GLUCOSE 193 (H) 02/20/2021 1355   GLUCOSE 141 (H) 03/02/2014 1411   BUN 16 02/20/2021 1355   BUN 18 03/02/2014 1411   CREATININE 0.96 02/20/2021 1355   CREATININE 1.26 03/02/2014 1411   CALCIUM 9.3 02/20/2021 1355   CALCIUM 9.1 03/02/2014 1411   PROT 7.4 02/20/2021 1355   PROT 7.1 03/02/2014 1411   ALBUMIN 3.9 02/20/2021 1355   ALBUMIN 2.9 (L) 03/02/2014 1411   AST 29 02/20/2021 1355   AST 17 03/02/2014 1411   ALT 32 02/20/2021 1355   ALT 21 03/02/2014 1411   ALKPHOS 84 02/20/2021 1355   ALKPHOS 69 03/02/2014 1411   BILITOT 0.6 02/20/2021 1355   BILITOT 0.4 03/02/2014 1411   GFRNONAA >60 02/20/2021 1355   GFRNONAA 46 (L) 03/02/2014 1411   GFRNONAA >60 11/11/2013 1451   GFRAA >60 06/22/2019 2151  GFRAA 56 (L) 03/02/2014 1411   GFRAA >60 11/11/2013 1451     Assessment and Plan: 1. CML (chronic myelocytic leukemia) ([place])   2. Encounter for antineoplastic chemotherapy     #CML, chronic phase, 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 02/20/2021, BCR ABL 1 FISH is negative.b3 a2 transcript 0.27%. Recommend patient to resume bosutinib 400 mg daily.   I recommend patient to get blood work done.  She prefers to have the lab encounter scheduled next week. Patient understands  the limitation of virtual visit.  I recommend patient to follow-up with me in 4 to 5 weeks with repeat blood work and in person visit.  Regarding to her acute symptoms with cough, weakness.  I recommend patient to call ambulance and go to ER for further evaluation.  Follow Up Instructions: Labs in 1 week Lab MD in 5 weeks    I discussed the assessment and treatment plan with the patient. The patient was provided an opportunity to ask questions and all were answered. The patient agreed with the plan and demonstrated an understanding of the instructions.  The patient was advised to call back or seek an in-person evaluation if the symptoms worsen or if the condition fails to improve as anticipated.   Earlie Server, MD 05/02/2021 8:53 PM

## 2021-05-04 ENCOUNTER — Other Ambulatory Visit: Payer: Medicare PPO

## 2021-05-04 ENCOUNTER — Inpatient Hospital Stay: Admission: RE | Admit: 2021-05-04 | Payer: Medicare PPO | Source: Ambulatory Visit

## 2021-05-06 ENCOUNTER — Ambulatory Visit
Admission: RE | Admit: 2021-05-06 | Discharge: 2021-05-06 | Disposition: A | Discharge status: Home / Self Care | Payer: Medicare PPO | Source: Ambulatory Visit | Attending: Chiropractic Medicine | Admitting: Chiropractic Medicine

## 2021-05-06 ENCOUNTER — Other Ambulatory Visit: Payer: Self-pay

## 2021-05-06 ENCOUNTER — Ambulatory Visit
Admission: RE | Admit: 2021-05-06 | Discharge: 2021-05-06 | Disposition: A | Discharge status: Home / Self Care | Payer: Medicare PPO | Source: Ambulatory Visit | Attending: Orthopaedic Surgery | Admitting: Orthopaedic Surgery

## 2021-05-06 DIAGNOSIS — M542 Cervicalgia: Secondary | ICD-10-CM

## 2021-05-06 DIAGNOSIS — M75102 Unspecified rotator cuff tear or rupture of left shoulder, not specified as traumatic: Secondary | ICD-10-CM | POA: Insufficient documentation

## 2021-05-09 ENCOUNTER — Inpatient Hospital Stay: Payer: Medicare PPO

## 2021-05-14 ENCOUNTER — Ambulatory Visit: Payer: Medicare PPO | Admitting: Physician Assistant

## 2021-05-22 ENCOUNTER — Ambulatory Visit
Admission: RE | Admit: 2021-05-22 | Discharge: 2021-05-22 | Disposition: A | Discharge status: Home / Self Care | Payer: Medicare PPO | Attending: Internal Medicine | Admitting: Internal Medicine

## 2021-05-22 ENCOUNTER — Ambulatory Visit
Admission: RE | Admit: 2021-05-22 | Discharge: 2021-05-22 | Disposition: A | Discharge status: Home / Self Care | Payer: Medicare PPO | Source: Ambulatory Visit | Attending: Internal Medicine | Admitting: Internal Medicine

## 2021-05-22 ENCOUNTER — Encounter: Payer: Self-pay | Admitting: Internal Medicine

## 2021-05-22 ENCOUNTER — Ambulatory Visit (INDEPENDENT_AMBULATORY_CARE_PROVIDER_SITE_OTHER): Payer: Medicare PPO | Admitting: Internal Medicine

## 2021-05-22 ENCOUNTER — Other Ambulatory Visit: Payer: Self-pay

## 2021-05-22 VITALS — BP 126/72 | HR 80 | Temp 97.9°F | Resp 16 | Ht 67.0 in | Wt 289.9 lb

## 2021-05-22 DIAGNOSIS — E1165 Type 2 diabetes mellitus with hyperglycemia: Secondary | ICD-10-CM | POA: Diagnosis not present

## 2021-05-22 DIAGNOSIS — C9210 Chronic myeloid leukemia, BCR/ABL-positive, not having achieved remission: Secondary | ICD-10-CM

## 2021-05-22 DIAGNOSIS — R058 Other specified cough: Secondary | ICD-10-CM | POA: Diagnosis present

## 2021-05-22 DIAGNOSIS — M1990 Unspecified osteoarthritis, unspecified site: Secondary | ICD-10-CM

## 2021-05-22 DIAGNOSIS — Z1159 Encounter for screening for other viral diseases: Secondary | ICD-10-CM | POA: Diagnosis not present

## 2021-05-22 DIAGNOSIS — F419 Anxiety disorder, unspecified: Secondary | ICD-10-CM

## 2021-05-22 DIAGNOSIS — I10 Essential (primary) hypertension: Secondary | ICD-10-CM

## 2021-05-22 DIAGNOSIS — F32A Depression, unspecified: Secondary | ICD-10-CM

## 2021-05-22 DIAGNOSIS — I5032 Chronic diastolic (congestive) heart failure: Secondary | ICD-10-CM

## 2021-05-22 DIAGNOSIS — R591 Generalized enlarged lymph nodes: Secondary | ICD-10-CM

## 2021-05-22 DIAGNOSIS — E7800 Pure hypercholesterolemia, unspecified: Secondary | ICD-10-CM

## 2021-05-22 MED ORDER — GUAIFENESIN ER 600 MG PO TB12: 600.0000 mg | ORAL_TABLET | Freq: Every day | ORAL | 0 refills | Status: DC

## 2021-05-22 MED ORDER — AMOXICILLIN-POT CLAVULANATE 875-125 MG PO TABS: 1.0000 | ORAL_TABLET | Freq: Two times a day (BID) | ORAL | 0 refills | Status: DC

## 2021-05-22 NOTE — Assessment & Plan Note (Signed)
Following with pain management

## 2021-05-22 NOTE — Assessment & Plan Note (Signed)
Reviewed last echo, EF 55-60%. Continue Lasix 40 mg.  ?

## 2021-05-22 NOTE — Assessment & Plan Note (Signed)
Uncertain if she is on a statin, medication list incorrect. Will recheck labs, she will bring all medications to follow up.  ?

## 2021-05-22 NOTE — Progress Notes (Signed)
? ?New Patient Office Visit ? ?Subjective:  ?Patient ID: Tara Levine, female    DOB: [birth year]  Age: 66 y.o. MRN: [id number] ? ?CC:  ?Chief Complaint  ?Patient presents with  ? Establish Care  ? ? ?HPI ?Tara Levine presents to establish care.  ? ?Diabetes, Type 2: ?-First diagnosed in 2011 ?-Last A1c 12/22 9.4% ?-Medications: Lantus 25 units at night, Novolog 10 units at meals, also on Trulicity (highest dose) ?-Patient is compliant with the above medications and reports no side effects. ?-Checking BG at home: 136-166 in the morning, checks post-prandials occasionally. No low blood sugars currently  ?-Diet: eats oatmeal, cornbread and black eyes peas  ?-Eye exam: Due ?-Foot exam: Seeing Podiatry, following up on Friday  ?-Microalbumin: Due ?-Statin: uncertain  ?-PNA vaccine: 2015 ?-Has symptoms of diabetic neuropathy on Lyrica, also follows with podiatry for ulcer on right foot ? ?Hypertension/CHF: ?-Medications: Losartan 100, Amlodipine 5, Lasix 40 mg  ?-Patient is compliant with above medications and reports no side effects. ?-Checking BP at home (average): 136/72 ?-Denies any SOB, CP, vision changes, LE edema or symptoms of hypotension ?-last echo /22 EF 55-60% ? ?HLD: ?-Medications: Lipitor dose uncertain  ?-Patient is compliant with above medications and reports no side effects.  ?-Last lipid panel: Lipid Panel  ?   ?Component Value Date/Time  ? TRIG 418 (H) 06/01/2012 1224  ? ?CML: ?-Following with Oncology, taking Bosulif 400 mg daily  ? ?MDD: ?-Following with Psychiatry  ?-Current treatment: Cymbalta 20 TID and 30 at night, xanax TID ? ? ?  05/22/2021  ?  1:24 PM  ?Depression screen PHQ 2/9  ?Decreased Interest 1  ?Down, Depressed, Hopeless 2  ?PHQ - 2 Score 3  ?Altered sleeping 1  ?Tired, decreased energy 3  ?Change in appetite 3  ?Feeling bad or failure about yourself  3  ?Trouble concentrating 3  ?Moving slowly or fidgety/restless 1  ?Suicidal thoughts 0  ?PHQ-9 Score 17  ?Difficult doing work/chores  Somewhat difficult  ? ?Chronic Pain: ?-Pain in back, shoulders and neck ?-Follows with pain management  ?-On opioids, having opioid induced constipation  ? ?URI Compliant:  ?-Fever: no ?-Cough: yes, productive green ?-Shortness of breath: yes ?-Wheezing: yes ?-Chest pain: no ?-Chest tightness: no ?-Chest congestion: yes ?-Nasal congestion: yes ?-Runny nose: yes ?-Post nasal drip: yes ?-Sneezing: yes ?-Sore throat: no ?-Swollen glands: yes ?-Sinus pressure: yes ?-Headache: yes ?-Face pain: yes ?-Ear pain: yes bilateral ?-Ear pressure: yes bilateral ? ?Past Medical History:  ?Diagnosis Date  ? CML (chronic myelocytic leukemia) ([place])   ? Depression   ? DM type 2 (diabetes mellitus, type 2) ([place])   ? Environmental allergies   ? History of gastric ulcer   ? HLD (hyperlipidemia)   ? IDA (iron deficiency anemia)   ? chronic  ? Leukemia ([place])   ? Leukemia, chronic myeloid ([place])   ? Osteoarthritis   ? Panic disorder   ? PSS (progressive systemic sclerosis) ([place])   ? Unspecified essential hypertension   ? ? ?Past Surgical History:  ?Procedure Laterality Date  ? back and neck surgery  1999, 2009  ? had rods placed  ? CARDIAC CATHETERIZATION  1990  ? [facility]) No blackages found.   ? CARPAL TUNNEL RELEASE    ? PARTIAL HYSTERECTOMY    ? age 55, no cancer  ? ? ?Family History  ?Problem Relation Age of Onset  ? Diabetes Mother   ? Hypertension Mother   ? Heart disease Mother   ?  Prostate cancer Father   ? Heart disease Father   ? Diabetes Brother   ? ? ?Social History  ? ?Socioeconomic History  ? Marital status: Married  ?  Spouse name: Not on file  ? Number of children: Not on file  ? Years of education: Not on file  ? Highest education level: Not on file  ?Occupational History  ? Not on file  ?Tobacco Use  ? Smoking status: Never  ? Smokeless tobacco: Never  ? Tobacco comments:  ?  quit 1985  ?Vaping Use  ? Vaping Use: Never used  ?Substance and Sexual Activity  ? Alcohol use: No  ?  Alcohol/week: 0.0  standard drinks  ? Drug use: No  ? Sexual activity: Not Currently  ?Other Topics Concern  ? Not on file  ?Social History Narrative  ? Not on file  ? ?Social Determinants of Health  ? ?Financial Resource Strain: Not on file  ?Food Insecurity: Not on file  ?Transportation Needs: Not on file  ?Physical Activity: Not on file  ?Stress: Not on file  ?Social Connections: Not on file  ?Intimate Partner Violence: Not on file  ? ? ?ROS ?Review of Systems  ?All other systems reviewed and are negative. ? ?Objective:  ? ?Today's Vitals: BP 126/72   Pulse 80   Temp 97.9 ?F (36.6 ?C)   Resp 16   Ht '5\' 7"'$  (1.702 m)   Wt 289 lb 14.4 oz (131.5 kg)   SpO2 93%   BMI 45.40 kg/m?  ? ?Physical Exam ?Constitutional:   ?   Appearance: Normal appearance. She is obese.  ?HENT:  ?   Head: Normocephalic and atraumatic.  ?   Right Ear: Tympanic membrane, ear canal and external ear normal.  ?   Left Ear: Tympanic membrane, ear canal and external ear normal.  ?   Nose: Congestion present.  ?   Mouth/Throat:  ?   Mouth: Mucous membranes are moist.  ?   Pharynx: Oropharynx is clear.  ?   Comments: PND present ?Eyes:  ?   Conjunctiva/sclera: Conjunctivae normal.  ?Cardiovascular:  ?   Rate and Rhythm: Normal rate and regular rhythm.  ?   Pulses:     ?     Dorsalis pedis pulses are 2+ on the right side and 2+ on the left side.  ?Pulmonary:  ?   Effort: Pulmonary effort is normal.  ?   Breath sounds: Normal breath sounds. No wheezing, rhonchi or rales.  ?Musculoskeletal:  ?   Right foot: Normal range of motion. No deformity, bunion, Charcot foot, foot drop or prominent metatarsal heads.  ?   Left foot: Normal range of motion. No deformity, bunion, Charcot foot, foot drop or prominent metatarsal heads.  ?Feet:  ?   Right foot:  ?   Protective Sensation: 5 sites tested.  0 sites sensed.  ?   Skin integrity: Ulcer present.  ?   Toenail Condition: Right toenails are normal.  ?   Left foot:  ?   Protective Sensation: 5 sites tested.   1 site  sensed. ?   Skin integrity: Skin integrity normal.  ?   Toenail Condition: Left toenails are normal.  ?Lymphadenopathy:  ?   Cervical: Cervical adenopathy present.  ?Skin: ?   General: Skin is warm and dry.  ?   Comments: 1x1 cm ulcer on bottom of right foot, no signs of infection currently   ?Neurological:  ?   General: No focal deficit present.  ?  Mental Status: She is alert. Mental status is at baseline.  ?Psychiatric:     ?   Mood and Affect: Mood normal.     ?   Behavior: Behavior normal.  ? ? ?Assessment & Plan:  ? ?Problem List Items Addressed This Visit   ? ?  ? Cardiovascular and Mediastinum  ? HTN (hypertension)  ?  Stable, continue current medications. ? ?  ?  ? Relevant Medications  ? amLODipine (NORVASC) 5 MG tablet  ? Other Relevant Orders  ? CBC w/Diff/Platelet  ? COMPLETE METABOLIC PANEL WITH GFR  ? Chronic diastolic CHF (congestive heart failure) ([place])  ?  Reviewed last echo, EF 55-60%. Continue Lasix 40 mg.  ?  ?  ? Relevant Medications  ? amLODipine (NORVASC) 5 MG tablet  ?  ? Endocrine  ? Diabetes mellitus ([place]) - Primary  ?  Recheck A1c, microalbumin. Referral placed to Ophthalmology. Uncertain exact insulin doses, will follow up in 2 weeks and bring all medications. Has diabetic polyneuropathy, large diabetic ulcer, following with podiatry.  ?  ?  ? Relevant Orders  ? Lipid Profile  ? HgB A1c  ? Urine Microalbumin w/creat. ratio  ? Ambulatory referral to Ophthalmology  ? HM Diabetes Foot Exam (Completed)  ?  ? Musculoskeletal and Integument  ? Arthritis, degenerative  ?  Following with pain management.  ?  ?  ? Relevant Medications  ? baclofen (LIORESAL) 10 MG tablet  ?  ? Other  ? Anxiety and depression  ?  Following with Psychiatry.  ?  ?  ? CML (chronic myelocytic leukemia) ([place])  ?  Following with Oncology.  ?  ?  ? Relevant Medications  ? amoxicillin-clavulanate (AUGMENTIN) 875-125 MG tablet  ? Hypercholesterolemia  ?  Uncertain if she is on a statin, medication list incorrect. Will  recheck labs, she will bring all medications to follow up.  ?  ?  ? Relevant Medications  ? amLODipine (NORVASC) 5 MG tablet  ? ?Other Visit Diagnoses   ? ? Need for hepatitis C screening test      ? Relevant Orders  ? Hepati

## 2021-05-22 NOTE — Assessment & Plan Note (Signed)
Following with Oncology

## 2021-05-22 NOTE — Assessment & Plan Note (Signed)
Recheck A1c, microalbumin. Referral placed to Ophthalmology. Uncertain exact insulin doses, will follow up in 2 weeks and bring all medications. Has diabetic polyneuropathy, large diabetic ulcer, following with podiatry.  ?

## 2021-05-22 NOTE — Assessment & Plan Note (Signed)
Following with Psychiatry.  ?

## 2021-05-22 NOTE — Patient Instructions (Addendum)
It was great seeing you today! ? ?Plan discussed at today's visit: ?-Blood work ordered today, results will be uploaded to [facility].  ?-Chest x-ray today ?-Referral placed to Ophthalmology  ?-Can use Mucinex 600 mg, will send an antibiotic to take twice a day for 10 days ?-Continue to follow up with Podiatry  ? ?Follow up in: 2 weeks please bring all of your medications with you ? ?Take care and let us know if you have any questions or concerns prior to your next visit. ? ?[name] ? ?

## 2021-05-22 NOTE — Assessment & Plan Note (Signed)
Stable, continue current medications.  

## 2021-05-23 LAB — HEMOGLOBIN A1C
Hgb A1c MFr Bld: 8.4 % of total Hgb — ABNORMAL HIGH (ref <5.7)
Mean Plasma Glucose: 194 mg/dL
eAG (mmol/L): 10.8 mmol/L

## 2021-05-23 LAB — CBC WITH DIFFERENTIAL/PLATELET
Absolute Monocytes: 688 cells/uL (ref 200–950)
Basophils Absolute: 19 cells/uL (ref 0–200)
Basophils Relative: 0.2 %
Eosinophils Absolute: 344 cells/uL (ref 15–500)
Eosinophils Relative: 3.7 %
HCT: 36.2 % (ref 35.0–45.0)
Hemoglobin: 11.6 g/dL — ABNORMAL LOW (ref 11.7–15.5)
Lymphs Abs: 1488 cells/uL (ref 850–3900)
MCH: 26.2 pg — ABNORMAL LOW (ref 27.0–33.0)
MCHC: 32 g/dL (ref 32.0–36.0)
MCV: 81.7 fL (ref 80.0–100.0)
MPV: 10.6 fL (ref 7.5–12.5)
Monocytes Relative: 7.4 %
Neutro Abs: 6761 cells/uL (ref 1500–7800)
Neutrophils Relative %: 72.7 %
Platelets: 300 10*3/uL (ref 140–400)
RBC: 4.43 10*6/uL (ref 3.80–5.10)
RDW: 14.2 % (ref 11.0–15.0)
Total Lymphocyte: 16 %
WBC: 9.3 10*3/uL (ref 3.8–10.8)

## 2021-05-23 LAB — COMPLETE METABOLIC PANEL WITH GFR
AG Ratio: 1.2 (calc) (ref 1.0–2.5)
ALT: 35 U/L — ABNORMAL HIGH (ref 6–29)
AST: 33 U/L (ref 10–35)
Albumin: 3.9 g/dL (ref 3.6–5.1)
Alkaline phosphatase (APISO): 84 U/L (ref 37–153)
BUN: 14 mg/dL (ref 7–25)
CO2: 29 mmol/L (ref 20–32)
Calcium: 9.5 mg/dL (ref 8.6–10.4)
Chloride: 100 mmol/L (ref 98–110)
Creat: 0.99 mg/dL (ref 0.50–1.05)
Globulin: 3.2 g/dL (calc) (ref 1.9–3.7)
Glucose, Bld: 156 mg/dL — ABNORMAL HIGH (ref 65–99)
Potassium: 4.6 mmol/L (ref 3.5–5.3)
Sodium: 139 mmol/L (ref 135–146)
Total Bilirubin: 0.4 mg/dL (ref 0.2–1.2)
Total Protein: 7.1 g/dL (ref 6.1–8.1)
eGFR: 63 mL/min/{1.73_m2} (ref >=60)

## 2021-05-23 LAB — MICROALBUMIN / CREATININE URINE RATIO
Creatinine, Urine: 148 mg/dL (ref 20–275)
Microalb Creat Ratio: 26 mcg/mg creat (ref <30)
Microalb, Ur: 3.8 mg/dL

## 2021-05-23 LAB — LIPID PANEL
Cholesterol: 130 mg/dL (ref <200)
HDL: 42 mg/dL — ABNORMAL LOW (ref >=50)
LDL Cholesterol (Calc): 64 mg/dL (calc)
Non-HDL Cholesterol (Calc): 88 mg/dL (calc) (ref <130)
Total CHOL/HDL Ratio: 3.1 (calc) (ref <5.0)
Triglycerides: 166 mg/dL — ABNORMAL HIGH (ref <150)

## 2021-05-23 LAB — HEPATITIS C ANTIBODY
Hepatitis C Ab: NONREACTIVE
SIGNAL TO CUT-OFF: <0.02 (ref <1.00)

## 2021-05-24 NOTE — Addendum Note (Signed)
Addended by: Docia Furl on: 05/24/2021 02:45 PM ? ? Modules accepted: Orders ? ?

## 2021-05-28 ENCOUNTER — Telehealth: Payer: Self-pay | Admitting: Internal Medicine

## 2021-05-28 NOTE — Telephone Encounter (Signed)
Medication Refill - Medication: Atorvastatin 20 MG / take 1x a day  ? ?Has the patient contacted their pharmacy? Yes.   ?(Agent: If no, request that the patient contact the pharmacy for the refill. If patient does not wish to contact the pharmacy document the reason why and proceed with request.) ?(Agent: If yes, when and what did the pharmacy advise?) ?Pt is new to pcp and needs new Rx sent to  ?Preferred Pharmacy (with phone number or street name):  ?[id number] [address] Phone:  [phone] ?Fax:  [phone] ?  ? ?Has the patient been seen for an appointment in the last year OR does the patient have an upcoming appointment? Yes.   ? ?Agent: Please be advised that RX refills may take up to 3 business days. We ask that you follow-up with your pharmacy. ?

## 2021-05-28 NOTE — Telephone Encounter (Unsigned)
Copied from [place] [id number]. Topic: General - Other ?>> May 28, 2021  4:06 PM Tara Levine wrote: ?Reason for CRM: Pt would like to let [name] know that she is feeling better and the antibiotic so far has helped the lump go away / FYI ?

## 2021-05-29 ENCOUNTER — Other Ambulatory Visit: Payer: Self-pay | Admitting: *Deleted

## 2021-05-29 ENCOUNTER — Ambulatory Visit: Payer: Self-pay | Admitting: *Deleted

## 2021-05-29 NOTE — Telephone Encounter (Signed)
Answer Assessment - Initial Assessment Questions ?1. LOCATION: "Where does it hurt?"  ?    Front and behind ears to neck ?2. ONSET: "When did the headache start?" (Minutes, hours or days)  ?    Started recently ?3. PATTERN: "Does the pain come and go, or has it been constant since it started?" ?    Got upset ?4. SEVERITY: "How bad is the pain?" and "What does it keep you from doing?"  (e.g., Scale 1-10; mild, moderate, or severe) ?  - MILD (1-3): doesn't interfere with normal activities  ?  - MODERATE (4-7): interferes with normal activities or awakens from sleep  ?  - SEVERE (8-10): excruciating pain, unable to do any normal activities    ?    8 ?5. RECURRENT SYMPTOM: "Have you ever had headaches before?" If Yes, ask: "When was the last time?" and "What happened that time?"  ?    Not usually, but out of bp med ?6. CAUSE: "What do you think is causing the headache?" ?    Out of blood pressure med, called in wrong med ?7. MIGRAINE: "Have you been diagnosed with migraine headaches?" If Yes, ask: "Is this headache similar?"  ?    no ?8. HEAD INJURY: "Has there been any recent injury to the head?"  ?    no ?9. OTHER SYMPTOMS: "Do you have any other symptoms?" (fever, stiff neck, eye pain, sore throat, cold symptoms) ?    Neck hurts just from headache ?10. PREGNANCY: "Is there any chance you are pregnant?" "When was your last menstrual period?" ?      na ? ?Protocols used: Headache-A-AH ? ?

## 2021-05-29 NOTE — Telephone Encounter (Signed)
Requested medication (s) are due for refill today: yes ? ?Requested medication (s) are on the active medication list: yes ? ?Last refill:  Historical provider ? ?Future visit scheduled: yes 06/05/21 ? ?Notes to clinic:  Historical Provider, please assess. ? ? ? ?  ? ?Requested Prescriptions  ?Pending Prescriptions Disp Refills  ? amLODipine (NORVASC) 5 MG tablet    ?  Sig: Take by mouth.  ?  ? Cardiovascular: Calcium Channel Blockers 2 Passed - 05/29/2021  4:55 PM  ?  ?  Passed - Last BP in normal range  ?  BP Readings from Last 1 Encounters:  ?05/22/21 126/72  ?  ?  ?  ?  Passed - Last Heart Rate in normal range  ?  Pulse Readings from Last 1 Encounters:  ?05/22/21 80  ?  ?  ?  ?  Passed - Valid encounter within last 6 months  ?  Recent Outpatient Visits   ? ?      ? 1 week ago Type 2 diabetes mellitus with hyperglycemia, unspecified whether long term insulin use ([place])  ? [facility] Teodora Medici, DO  ? ?  ?  ?Future Appointments   ? ?        ? In 1 week Teodora Medici, DO [facility], [place]  ? ?  ? ?  ?  ?  ? ? ?

## 2021-05-29 NOTE — Addendum Note (Signed)
Addended by: Hoyle Barr on: 05/29/2021 04:46 PM ? ? Modules accepted: Orders ? ?

## 2021-05-29 NOTE — Telephone Encounter (Deleted)
Reason for Disposition ?? Similar to previously diagnosed muscle-tension headaches ? ?Protocols used: Headache-A-AH ? ?

## 2021-05-30 MED ORDER — AMLODIPINE BESYLATE 5 MG PO TABS: 5.0000 mg | ORAL_TABLET | Freq: Every day | ORAL | 0 refills | Status: AC

## 2021-05-30 NOTE — Telephone Encounter (Signed)
Pt returned call. States "I got my BP med straightened out and I do not have a headache now." Offered appt as noted, declined. States she will follow up at next appt 06/05/21. Pt also requested review of recent labs. States they were discussed but forgot. Reviewed with pt, verbalizes understanding.  ?Pt would like [name] to know if Insulin required, would like "[place]." ?

## 2021-05-30 NOTE — Telephone Encounter (Signed)
Tried calling patient at 7:24 to get her an appt but could not lvm for it was full. If patient calls back we can set her up with an appt for today for at this time there is avail.

## 2021-05-30 NOTE — Addendum Note (Signed)
Addended by: Docia Furl on: 05/30/2021 11:27 AM ? ? Modules accepted: Orders ? ?

## 2021-06-05 ENCOUNTER — Ambulatory Visit: Payer: Medicare PPO | Admitting: Internal Medicine

## 2021-06-05 NOTE — Progress Notes (Deleted)
? ?New Patient Office Visit ? ?Subjective:  ?Patient ID: Tara Levine, female    DOB: 1955-06-14  Age: 66 y.o. MRN: [id number] ? ?CC:  ?No chief complaint on file. ? ? ?HPI ?Tara Levine here for follow up.  ? ?Diabetes, Type 2: ?-First diagnosed in 2011 ?-Last A1c 3/23 8.4% ?-Medications: Lantus 25 units at night, Novolog 10 units at meals, also on Trulicity (highest dose) ?-Patient is compliant with the above medications and reports no side effects. ?-Checking BG at home: 136-166 in the morning, checks post-prandials occasionally. No low blood sugars currently  ?-Diet: eats oatmeal, cornbread and black eyes peas  ?-Eye exam: Due ?-Foot exam: Seeing Podiatry, following up on Friday  ?-Microalbumin: UTD 3/23 ?-Statin: no ?-PNA vaccine: 2015 ?-Has symptoms of diabetic neuropathy on Lyrica, also follows with podiatry for ulcer on right foot ? ?Hypertension/CHF: ?-Medications: Losartan 100, Amlodipine 5, Lasix 40 mg  ?-Patient is compliant with above medications and reports no side effects. ?-Checking BP at home (average): 136/72 ?-Denies any SOB, CP, vision changes, LE edema or symptoms of hypotension ?-last echo /22 EF 55-60% ? ?HLD: ?-Medications: Lipitor dose uncertain  ?-Patient is compliant with above medications and reports no side effects.  ?-Last lipid panel: Lipid Panel  ?   ?Component Value Date/Time  ? CHOL 130 05/22/2021 1446  ? TRIG 166 (H) 05/22/2021 1446  ? TRIG 418 (H) 06/01/2012 1224  ? HDL 42 (L) 05/22/2021 1446  ? CHOLHDL 3.1 05/22/2021 1446  ? Tara Levine 64 05/22/2021 1446  ? ?The 10-year ASCVD risk score (Arnett DK, et al., 2019) is: 13.5% ?  Values used to calculate the score: ?    Age: 41 years ?    Sex: Female ?    Is Non-Hispanic African American: No ?    Diabetic: Yes ?    Tobacco smoker: No ?    Systolic Blood Pressure: 854 mmHg ?    Is BP treated: Yes ?    HDL Cholesterol: 42 mg/dL ?    Total Cholesterol: 130 mg/dL ? ?CML: ?-Following with Oncology, taking Bosulif 400 mg daily   ? ?MDD: ?-Following with Psychiatry  ?-Current treatment: Cymbalta 20 TID and 30 at night, xanax TID ? ? ?  05/22/2021  ?  1:24 PM  ?Depression screen PHQ 2/9  ?Decreased Interest 1  ?Down, Depressed, Hopeless 2  ?PHQ - 2 Score 3  ?Altered sleeping 1  ?Tired, decreased energy 3  ?Change in appetite 3  ?Feeling bad or failure about yourself  3  ?Trouble concentrating 3  ?Moving slowly or fidgety/restless 1  ?Suicidal thoughts 0  ?PHQ-9 Score 17  ?Difficult doing work/chores Somewhat difficult  ? ?Chronic Pain: ?-Pain in back, shoulders and neck ?-Follows with pain management  ?-On opioids, having opioid induced constipation  ? ?Cervical Lymphadenopathy and Productive Cough: ?-Chest x-ray obtained 05/22/21 negative for pneumonia ?-Treated lymphadenopathy with Augmentin, today  ?Past Medical History:  ?Diagnosis Date  ? CML (chronic myelocytic leukemia) ([place])   ? Depression   ? DM type 2 (diabetes mellitus, type 2) ([place])   ? Environmental allergies   ? History of gastric ulcer   ? HLD (hyperlipidemia)   ? IDA (iron deficiency anemia)   ? chronic  ? Leukemia ([place])   ? 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 Osteoarthritis   ? Panic disorder   ? PSS (progressive systemic sclerosis) ([place])   ? Unspecified essential hypertension   ? ? ?Past Surgical History:  ?Procedure Laterality Date  ? back  and neck surgery  1999, 2009  ? had rods placed  ? CARDIAC CATHETERIZATION  1990  ? [facility]) No blackages found.   ? CARPAL TUNNEL RELEASE    ? PARTIAL HYSTERECTOMY    ? age 66, no cancer  ? ? ?Family History  ?Problem Relation Age of Onset  ? Diabetes Mother   ? Hypertension Mother   ? Heart disease Mother   ? Prostate cancer Father   ? Heart disease Father   ? Diabetes Brother   ? ? ?Social History  ? ?Socioeconomic History  ? Marital status: Married  ?  Spouse name: Not on file  ? Number of children: Not on file  ? Years of education: Not on file  ? Highest education level: Not on file  ?Occupational History  ? Not  on file  ?Tobacco Use  ? Smoking status: Never  ? Smokeless tobacco: Never  ? Tobacco comments:  ?  quit 1985  ?Vaping Use  ? Vaping Use: Never used  ?Substance and Sexual Activity  ? Alcohol use: No  ?  Alcohol/week: 0.0 standard drinks  ? Drug use: No  ? Sexual activity: Not Currently  ?Other Topics Concern  ? Not on file  ?Social History Narrative  ? Not on file  ? ?Social Determinants of Health  ? ?Financial Resource Strain: Not on file  ?Food Insecurity: Not on file  ?Transportation Needs: Not on file  ?Physical Activity: Not on file  ?Stress: Not on file  ?Social Connections: Not on file  ?Intimate Partner Violence: Not on file  ? ? ?ROS ?Review of Systems  ?All other systems reviewed and are negative. ? ?Objective:  ? ?Today's Vitals: There were no vitals taken for this visit. ? ?Physical Exam ?Constitutional:   ?   Appearance: Normal appearance. She is obese.  ?HENT:  ?   Head: Normocephalic and atraumatic.  ?   Right Ear: Tympanic membrane, ear canal and external ear normal.  ?   Left Ear: Tympanic membrane, ear canal and external ear normal.  ?   Nose: Congestion present.  ?   Mouth/Throat:  ?   Mouth: Mucous membranes are moist.  ?   Pharynx: Oropharynx is clear.  ?   Comments: PND present ?Eyes:  ?   Conjunctiva/sclera: Conjunctivae normal.  ?Cardiovascular:  ?   Rate and Rhythm: Normal rate and regular rhythm.  ?   Pulses:     ?     Dorsalis pedis pulses are 2+ on the right side and 2+ on the left side.  ?Pulmonary:  ?   Effort: Pulmonary effort is normal.  ?   Breath sounds: Normal breath sounds. No wheezing, rhonchi or rales.  ?Musculoskeletal:  ?   Right foot: Normal range of motion. No deformity, bunion, Charcot foot, foot drop or prominent metatarsal heads.  ?   Left foot: Normal range of motion. No deformity, bunion, Charcot foot, foot drop or prominent metatarsal heads.  ?Feet:  ?   Right foot:  ?   Protective Sensation: 5 sites tested.  0 sites sensed.  ?   Skin integrity: Ulcer present.  ?    Toenail Condition: Right toenails are normal.  ?   Left foot:  ?   Protective Sensation: 5 sites tested.   1 site sensed. ?   Skin integrity: Skin integrity normal.  ?   Toenail Condition: Left toenails are normal.  ?Lymphadenopathy:  ?   Cervical: Cervical adenopathy present.  ?Skin: ?  General: Skin is warm and dry.  ?   Comments: 1x1 cm ulcer on bottom of right foot, no signs of infection currently   ?Neurological:  ?   General: No focal deficit present.  ?   Mental Status: She is alert. Mental status is at baseline.  ?Psychiatric:     ?   Mood and Affect: Mood normal.     ?   Behavior: Behavior normal.  ? ? ?Assessment & Plan:  ? ?Problem List Items Addressed This Visit   ? ?  ? Cardiovascular and Mediastinum  ? HTN (hypertension)  ?  Stable, continue current medications. ? ?  ?  ? Relevant Medications  ? amLODipine (NORVASC) 5 MG tablet  ? Other Relevant Orders  ? CBC w/Diff/Platelet  ? COMPLETE METABOLIC PANEL WITH GFR  ? Chronic diastolic CHF (congestive heart failure) ([place])  ?  Reviewed last echo, EF 55-60%. Continue Lasix 40 mg.  ?  ?  ? Relevant Medications  ? amLODipine (NORVASC) 5 MG tablet  ?  ? Endocrine  ? Diabetes mellitus ([place]) - Primary  ?  Recheck A1c, microalbumin. Referral placed to Ophthalmology. Uncertain exact insulin doses, will follow up in 2 weeks and bring all medications. Has diabetic polyneuropathy, large diabetic ulcer, following with podiatry.  ?  ?  ? Relevant Orders  ? Lipid Profile  ? HgB A1c  ? Urine Microalbumin w/creat. ratio  ? Ambulatory referral to Ophthalmology  ? HM Diabetes Foot Exam (Completed)  ?  ? Musculoskeletal and Integument  ? Arthritis, degenerative  ?  Following with pain management.  ?  ?  ? Relevant Medications  ? baclofen (LIORESAL) 10 MG tablet  ?  ? Other  ? Anxiety and depression  ?  Following with Psychiatry.  ?  ?  ? CML (chronic myelocytic leukemia) ([place])  ?  Following with Oncology.  ?  ?  ? Relevant Medications  ? amoxicillin-clavulanate (AUGMENTIN)  875-125 MG tablet  ? Hypercholesterolemia  ?  Uncertain if she is on a statin, medication list incorrect. Will recheck labs, she will bring all medications to follow up.  ?  ?  ? Relevant Medications  ? amLODipine (N

## 2021-06-06 ENCOUNTER — Telehealth: Payer: Self-pay

## 2021-06-08 ENCOUNTER — Ambulatory Visit: Payer: Medicare PPO | Admitting: Internal Medicine

## 2021-06-08 ENCOUNTER — Telehealth: Payer: Self-pay | Admitting: Internal Medicine

## 2021-06-08 NOTE — Progress Notes (Deleted)
? ?New Patient Office Visit ? ?Subjective:  ?Patient ID: Tara Levine, female    DOB: 10/26/1955  Age: 66 y.o. MRN: [id number] ? ?CC:  ?No chief complaint on file. ? ? ?HPI ?Tara Levine here for follow up.  ? ?Diabetes, Type 2: ?-First diagnosed in 2011 ?-Last A1c 3/23 8.4% ?-Medications: Lantus 25 units at night, Novolog 10 units at meals, also on Trulicity (highest dose) ?-Patient is compliant with the above medications and reports no side effects. ?-Checking BG at home: 136-166 in the morning, checks post-prandials occasionally. No low blood sugars currently  ?-Diet: eats oatmeal, cornbread and black eyes peas  ?-Eye exam: Due ?-Foot exam: Seeing Podiatry, following up on Friday  ?-Microalbumin: UTD 3/23 ?-Statin: no ?-PNA vaccine: 2015 ?-Has symptoms of diabetic neuropathy on Lyrica, also follows with podiatry for ulcer on right foot ? ?Hypertension/CHF: ?-Medications: Losartan 100, Amlodipine 5, Lasix 40 mg  ?-Patient is compliant with above medications and reports no side effects. ?-Checking BP at home (average): 136/72 ?-Denies any SOB, CP, vision changes, LE edema or symptoms of hypotension ?-last echo /22 EF 55-60% ? ?HLD: ?-Medications: Lipitor dose uncertain  ?-Patient is compliant with above medications and reports no side effects.  ?-Last lipid panel: Lipid Panel  ?   ?Component Value Date/Time  ? CHOL 130 05/22/2021 1446  ? TRIG 166 (H) 05/22/2021 1446  ? TRIG 418 (H) 06/01/2012 1224  ? HDL 42 (L) 05/22/2021 1446  ? CHOLHDL 3.1 05/22/2021 1446  ? [place] 64 05/22/2021 1446  ? ?The 10-year ASCVD risk score (Arnett DK, et al., 2019) is: 13.5% ?  Values used to calculate the score: ?    Age: 58 years ?    Sex: Female ?    Is Non-Hispanic African American: No ?    Diabetic: Yes ?    Tobacco smoker: No ?    Systolic Blood Pressure: 409 mmHg ?    Is BP treated: Yes ?    HDL Cholesterol: 42 mg/dL ?    Total Cholesterol: 130 mg/dL ? ?CML: ?-Following with Oncology, taking Bosulif 400 mg daily   ? ?MDD: ?-Following with Psychiatry  ?-Current treatment: Cymbalta 20 TID and 30 at night, xanax TID ? ? ?  05/22/2021  ?  1:24 PM  ?Depression screen PHQ 2/9  ?Decreased Interest 1  ?Down, Depressed, Hopeless 2  ?PHQ - 2 Score 3  ?Altered sleeping 1  ?Tired, decreased energy 3  ?Change in appetite 3  ?Feeling bad or failure about yourself  3  ?Trouble concentrating 3  ?Moving slowly or fidgety/restless 1  ?Suicidal thoughts 0  ?PHQ-9 Score 17  ?Difficult doing work/chores Somewhat difficult  ? ?Chronic Pain: ?-Pain in back, shoulders and neck ?-Follows with pain management  ?-On opioids, having opioid induced constipation  ? ?Cervical Lymphadenopathy and Productive Cough: ?-Chest x-ray obtained 05/22/21 negative for pneumonia ?-Treated lymphadenopathy with Augmentin, today  ?Past Medical History:  ?Diagnosis Date  ? CML (chronic myelocytic leukemia) ([place])   ? Depression   ? DM type 2 (diabetes mellitus, type 2) ([place])   ? Environmental allergies   ? History of gastric ulcer   ? HLD (hyperlipidemia)   ? IDA (iron deficiency anemia)   ? chronic  ? Leukemia ([place])   ? Leukemia, chronic myeloid ([place])   ? Osteoarthritis   ? Panic disorder   ? PSS (progressive systemic sclerosis) ([place])   ? Unspecified essential hypertension   ? ? ?Past Surgical History:  ?Procedure Laterality Date  ? back  and neck surgery  1999, 2009  ? had rods placed  ? CARDIAC CATHETERIZATION  1990  ? [facility]) No blackages found.   ? CARPAL TUNNEL RELEASE    ? PARTIAL HYSTERECTOMY    ? age 58, no cancer  ? ? ?Family History  ?Problem Relation Age of Onset  ? Diabetes Mother   ? Hypertension Mother   ? Heart disease Mother   ? Prostate cancer Father   ? Heart disease Father   ? Diabetes Brother   ? ? ?Social History  ? ?Socioeconomic History  ? Marital status: Married  ?  Spouse name: Not on file  ? Number of children: Not on file  ? Years of education: Not on file  ? Highest education level: Not on file  ?Occupational History  ? Not  on file  ?Tobacco Use  ? Smoking status: Never  ? Smokeless tobacco: Never  ? Tobacco comments:  ?  quit 1985  ?Vaping Use  ? Vaping Use: Never used  ?Substance and Sexual Activity  ? Alcohol use: No  ?  Alcohol/week: 0.0 standard drinks  ? Drug use: No  ? Sexual activity: Not Currently  ?Other Topics Concern  ? Not on file  ?Social History Narrative  ? Not on file  ? ?Social Determinants of Health  ? ?Financial Resource Strain: Not on file  ?Food Insecurity: Not on file  ?Transportation Needs: Not on file  ?Physical Activity: Not on file  ?Stress: Not on file  ?Social Connections: Not on file  ?Intimate Partner Violence: Not on file  ? ? ?ROS ?Review of Systems  ?All other systems reviewed and are negative. ? ?Objective:  ? ?Today's Vitals: There were no vitals taken for this visit. ? ?Physical Exam ?Constitutional:   ?   Appearance: Normal appearance. She is obese.  ?HENT:  ?   Head: Normocephalic and atraumatic.  ?   Right Ear: Tympanic membrane, ear canal and external ear normal.  ?   Left Ear: Tympanic membrane, ear canal and external ear normal.  ?   Nose: Congestion present.  ?   Mouth/Throat:  ?   Mouth: Mucous membranes are moist.  ?   Pharynx: Oropharynx is clear.  ?   Comments: PND present ?Eyes:  ?   Conjunctiva/sclera: Conjunctivae normal.  ?Cardiovascular:  ?   Rate and Rhythm: Normal rate and regular rhythm.  ?   Pulses:     ?     Dorsalis pedis pulses are 2+ on the right side and 2+ on the left side.  ?Pulmonary:  ?   Effort: Pulmonary effort is normal.  ?   Breath sounds: Normal breath sounds. No wheezing, rhonchi or rales.  ?Musculoskeletal:  ?   Right foot: Normal range of motion. No deformity, bunion, Charcot foot, foot drop or prominent metatarsal heads.  ?   Left foot: Normal range of motion. No deformity, bunion, Charcot foot, foot drop or prominent metatarsal heads.  ?Feet:  ?   Right foot:  ?   Protective Sensation: 5 sites tested.  0 sites sensed.  ?   Skin integrity: Ulcer present.  ?    Toenail Condition: Right toenails are normal.  ?   Left foot:  ?   Protective Sensation: 5 sites tested.   1 site sensed. ?   Skin integrity: Skin integrity normal.  ?   Toenail Condition: Left toenails are normal.  ?Lymphadenopathy:  ?   Cervical: Cervical adenopathy present.  ?Skin: ?  General: Skin is warm and dry.  ?   Comments: 1x1 cm ulcer on bottom of right foot, no signs of infection currently   ?Neurological:  ?   General: No focal deficit present.  ?   Mental Status: She is alert. Mental status is at baseline.  ?Psychiatric:     ?   Mood and Affect: Mood normal.     ?   Behavior: Behavior normal.  ? ? ?Assessment & Plan:  ? ?Problem List Items Addressed This Visit   ? ?  ? Cardiovascular and Mediastinum  ? HTN (hypertension)  ?  Stable, continue current medications. ? ?  ?  ? Relevant Medications  ? amLODipine (NORVASC) 5 MG tablet  ? Other Relevant Orders  ? CBC w/Diff/Platelet  ? COMPLETE METABOLIC PANEL WITH GFR  ? Chronic diastolic CHF (congestive heart failure) ([place])  ?  Reviewed last echo, EF 55-60%. Continue Lasix 40 mg.  ?  ?  ? Relevant Medications  ? amLODipine (NORVASC) 5 MG tablet  ?  ? Endocrine  ? Diabetes mellitus ([place]) - Primary  ?  Recheck A1c, microalbumin. Referral placed to Ophthalmology. Uncertain exact insulin doses, will follow up in 2 weeks and bring all medications. Has diabetic polyneuropathy, large diabetic ulcer, following with podiatry.  ?  ?  ? Relevant Orders  ? Lipid Profile  ? HgB A1c  ? Urine Microalbumin w/creat. ratio  ? Ambulatory referral to Ophthalmology  ? HM Diabetes Foot Exam (Completed)  ?  ? Musculoskeletal and Integument  ? Arthritis, degenerative  ?  Following with pain management.  ?  ?  ? Relevant Medications  ? baclofen (LIORESAL) 10 MG tablet  ?  ? Other  ? Anxiety and depression  ?  Following with Psychiatry.  ?  ?  ? CML (chronic myelocytic leukemia) ([place])  ?  Following with Oncology.  ?  ?  ? Relevant Medications  ? amoxicillin-clavulanate (AUGMENTIN)  875-125 MG tablet  ? Hypercholesterolemia  ?  Uncertain if she is on a statin, medication list incorrect. Will recheck labs, she will bring all medications to follow up.  ?  ?  ? Relevant Medications  ? amLODipine (N

## 2021-06-08 NOTE — Telephone Encounter (Signed)
Call to patient to verify dosing of atorvastatin- patient is not sure of dose. [facility] because [facility] were [facility]. Patient states she was using [phone]. They are closed now but reopen Monday at 8:30. Unable to call them to verify dosing of medication ?

## 2021-06-08 NOTE — Telephone Encounter (Signed)
Medication Refill - Medication: atorvastatin  ? ?Pt stated she has no more refills; however, her PCP has never prescribed because she forgot the name of the medication at the time of her new pt appointment.  ?Pt requesting for PCP to refill.  ? ?Has the patient contacted their pharmacy? Yes.   ? ?(Agent: If yes, when and what did the pharmacy advise?) ? ?Preferred Pharmacy (with phone number or street name):  ?Has the patient been seen for an appointment in the last year OR ?[id number] [address]  [address] ?Phone: [phone]: [phone] ?Hours: Not open 24 hours  ? does the patient have an upcoming appointment? Yes.   ? ?Agent: Please be advised that RX refills may take up to 3 business days. We ask that you follow-up with your pharmacy.  ?

## 2021-06-11 ENCOUNTER — Inpatient Hospital Stay: Payer: Medicare PPO | Admitting: Oncology

## 2021-06-11 ENCOUNTER — Inpatient Hospital Stay: Payer: Medicare PPO

## 2021-06-11 NOTE — Telephone Encounter (Signed)
Left vm with Tara Levine for dose of atorvastatin ?

## 2021-06-12 ENCOUNTER — Ambulatory Visit: Payer: Self-pay | Admitting: *Deleted

## 2021-06-12 ENCOUNTER — Telehealth (INDEPENDENT_AMBULATORY_CARE_PROVIDER_SITE_OTHER): Payer: Medicare PPO | Admitting: Internal Medicine

## 2021-06-12 ENCOUNTER — Encounter: Payer: Self-pay | Admitting: Internal Medicine

## 2021-06-12 DIAGNOSIS — I5032 Chronic diastolic (congestive) heart failure: Secondary | ICD-10-CM | POA: Diagnosis not present

## 2021-06-12 DIAGNOSIS — G8929 Other chronic pain: Secondary | ICD-10-CM

## 2021-06-12 DIAGNOSIS — F339 Major depressive disorder, recurrent, unspecified: Secondary | ICD-10-CM

## 2021-06-12 DIAGNOSIS — Z794 Long term (current) use of insulin: Secondary | ICD-10-CM

## 2021-06-12 DIAGNOSIS — L0390 Cellulitis, unspecified: Secondary | ICD-10-CM

## 2021-06-12 DIAGNOSIS — R52 Pain, unspecified: Secondary | ICD-10-CM

## 2021-06-12 DIAGNOSIS — K219 Gastro-esophageal reflux disease without esophagitis: Secondary | ICD-10-CM

## 2021-06-12 DIAGNOSIS — I10 Essential (primary) hypertension: Secondary | ICD-10-CM

## 2021-06-12 DIAGNOSIS — C9210 Chronic myeloid leukemia, BCR/ABL-positive, not having achieved remission: Secondary | ICD-10-CM

## 2021-06-12 DIAGNOSIS — E7800 Pure hypercholesterolemia, unspecified: Secondary | ICD-10-CM | POA: Diagnosis not present

## 2021-06-12 DIAGNOSIS — E1165 Type 2 diabetes mellitus with hyperglycemia: Secondary | ICD-10-CM | POA: Diagnosis not present

## 2021-06-12 DIAGNOSIS — R591 Generalized enlarged lymph nodes: Secondary | ICD-10-CM

## 2021-06-12 DIAGNOSIS — Z1211 Encounter for screening for malignant neoplasm of colon: Secondary | ICD-10-CM

## 2021-06-12 MED ORDER — OZEMPIC (0.25 OR 0.5 MG/DOSE) 2 MG/1.5ML ~~LOC~~ SOPN: PEN_INJECTOR | SUBCUTANEOUS | 0 refills | Status: DC

## 2021-06-12 MED ORDER — ATORVASTATIN CALCIUM 20 MG PO TABS: 20.0000 mg | ORAL_TABLET | Freq: Every day | ORAL | 3 refills | Status: AC

## 2021-06-12 NOTE — Assessment & Plan Note (Signed)
Following with Oncology, reviewed medications.  ?

## 2021-06-12 NOTE — Progress Notes (Deleted)
? ?New Patient Office Visit ? ?Subjective:  ?Patient ID: Tara Levine, female    DOB: [birth year]  Age: 66 y.o. MRN: [id number] ? ?CC:  ?No chief complaint on file. ? ? ?HPI ?Tara Levine here for follow up.  ? ?Diabetes, Type 2: ?-First diagnosed in 2011 ?-Last A1c 3/23 8.4% ?-Medications: Lantus 25 units at night, Novolog 10 units at meals, also on Trulicity (highest dose) ?-Patient is compliant with the above medications and reports no side effects. ?-Checking BG at home: 136-166 in the morning, checks post-prandials occasionally. No low blood sugars currently  ?-Diet: eats oatmeal, cornbread and black eyes peas  ?-Eye exam: Due ?-Foot exam: Seeing Podiatry, following up on Friday  ?-Microalbumin: UTD 3/23 ?-Statin: no ?-PNA vaccine: 2015 ?-Has symptoms of diabetic neuropathy on Lyrica, also follows with podiatry for ulcer on right foot ? ?Hypertension/CHF: ?-Medications: Losartan 100, Amlodipine 5, Lasix 40 mg  ?-Patient is compliant with above medications and reports no side effects. ?-Checking BP at home (average): 136/72 ?-Denies any SOB, CP, vision changes, LE edema or symptoms of hypotension ?-last echo /22 EF 55-60% ? ?HLD: ?-Medications: Lipitor dose uncertain  ?-Patient is compliant with above medications and reports no side effects.  ?-Last lipid panel: Lipid Panel  ?   ?Component Value Date/Time  ? CHOL 130 05/22/2021 1446  ? TRIG 166 (H) 05/22/2021 1446  ? TRIG 418 (H) 06/01/2012 1224  ? HDL 42 (L) 05/22/2021 1446  ? CHOLHDL 3.1 05/22/2021 1446  ? [place] 64 05/22/2021 1446  ? ?The 10-year ASCVD risk score (Arnett DK, et al., 2019) is: 13.5% ?  Values used to calculate the score: ?    Age: 51 years ?    Sex: Female ?    Is Non-Hispanic African American: No ?    Diabetic: Yes ?    Tobacco smoker: No ?    Systolic Blood Pressure: 831 mmHg ?    Is BP treated: Yes ?    HDL Cholesterol: 42 mg/dL ?    Total Cholesterol: 130 mg/dL ? ?CML: ?-Following with Oncology, taking Bosulif 400 mg daily   ? ?MDD: ?-Following with Psychiatry  ?-Current treatment: Cymbalta 20 TID and 30 at night, xanax TID ? ? ?  05/22/2021  ?  1:24 PM  ?Depression screen PHQ 2/9  ?Decreased Interest 1  ?Down, Depressed, Hopeless 2  ?PHQ - 2 Score 3  ?Altered sleeping 1  ?Tired, decreased energy 3  ?Change in appetite 3  ?Feeling bad or failure about yourself  3  ?Trouble concentrating 3  ?Moving slowly or fidgety/restless 1  ?Suicidal thoughts 0  ?PHQ-9 Score 17  ?Difficult doing work/chores Somewhat difficult  ? ?Chronic Pain: ?-Pain in back, shoulders and neck ?-Follows with pain management  ?-On opioids, having opioid induced constipation  ? ?Cervical Lymphadenopathy and Productive Cough: ?-Chest x-ray obtained 05/22/21 negative for pneumonia ?-Treated lymphadenopathy with Augmentin, today  ?Past Medical History:  ?Diagnosis Date  ? CML (chronic myelocytic leukemia) ([place])   ? Depression   ? DM type 2 (diabetes mellitus, type 2) ([place])   ? Environmental allergies   ? History of gastric ulcer   ? HLD (hyperlipidemia)   ? IDA (iron deficiency anemia)   ? chronic  ? Leukemia ([place])   ? Leukemia, chronic myeloid ([place])   ? Osteoarthritis   ? Panic disorder   ? PSS (progressive systemic sclerosis) ([place])   ? Unspecified essential hypertension   ? ? ?Past Surgical History:  ?Procedure Laterality Date  ? back  and neck surgery  1999, 2009  ? had rods placed  ? CARDIAC CATHETERIZATION  1990  ? [facility]) No blackages found.   ? CARPAL TUNNEL RELEASE    ? PARTIAL HYSTERECTOMY    ? age 56, no cancer  ? ? ?Family History  ?Problem Relation Age of Onset  ? Diabetes Mother   ? Hypertension Mother   ? Heart disease Mother   ? Prostate cancer Father   ? Heart disease Father   ? Diabetes Brother   ? ? ?Social History  ? ?Socioeconomic History  ? Marital status: Married  ?  Spouse name: Not on file  ? Number of children: Not on file  ? Years of education: Not on file  ? Highest education level: Not on file  ?Occupational History  ? Not  on file  ?Tobacco Use  ? Smoking status: Never  ? Smokeless tobacco: Never  ? Tobacco comments:  ?  quit 1985  ?Vaping Use  ? Vaping Use: Never used  ?Substance and Sexual Activity  ? Alcohol use: No  ?  Alcohol/week: 0.0 standard drinks  ? Drug use: No  ? Sexual activity: Not Currently  ?Other Topics Concern  ? Not on file  ?Social History Narrative  ? Not on file  ? ?Social Determinants of Health  ? ?Financial Resource Strain: Not on file  ?Food Insecurity: Not on file  ?Transportation Needs: Not on file  ?Physical Activity: Not on file  ?Stress: Not on file  ?Social Connections: Not on file  ?Intimate Partner Violence: Not on file  ? ? ?ROS ?Review of Systems  ?All other systems reviewed and are negative. ? ?Objective:  ? ?Today's Vitals: There were no vitals taken for this visit. ? ?Physical Exam ?Constitutional:   ?   Appearance: Normal appearance. She is obese.  ?HENT:  ?   Head: Normocephalic and atraumatic.  ?   Right Ear: Tympanic membrane, ear canal and external ear normal.  ?   Left Ear: Tympanic membrane, ear canal and external ear normal.  ?   Nose: Congestion present.  ?   Mouth/Throat:  ?   Mouth: Mucous membranes are moist.  ?   Pharynx: Oropharynx is clear.  ?   Comments: PND present ?Eyes:  ?   Conjunctiva/sclera: Conjunctivae normal.  ?Cardiovascular:  ?   Rate and Rhythm: Normal rate and regular rhythm.  ?   Pulses:     ?     Dorsalis pedis pulses are 2+ on the right side and 2+ on the left side.  ?Pulmonary:  ?   Effort: Pulmonary effort is normal.  ?   Breath sounds: Normal breath sounds. No wheezing, rhonchi or rales.  ?Musculoskeletal:  ?   Right foot: Normal range of motion. No deformity, bunion, Charcot foot, foot drop or prominent metatarsal heads.  ?   Left foot: Normal range of motion. No deformity, bunion, Charcot foot, foot drop or prominent metatarsal heads.  ?Feet:  ?   Right foot:  ?   Protective Sensation: 5 sites tested.  0 sites sensed.  ?   Skin integrity: Ulcer present.  ?    Toenail Condition: Right toenails are normal.  ?   Left foot:  ?   Protective Sensation: 5 sites tested.   1 site sensed. ?   Skin integrity: Skin integrity normal.  ?   Toenail Condition: Left toenails are normal.  ?Lymphadenopathy:  ?   Cervical: Cervical adenopathy present.  ?Skin: ?  General: Skin is warm and dry.  ?   Comments: 1x1 cm ulcer on bottom of right foot, no signs of infection currently   ?Neurological:  ?   General: No focal deficit present.  ?   Mental Status: She is alert. Mental status is at baseline.  ?Psychiatric:     ?   Mood and Affect: Mood normal.     ?   Behavior: Behavior normal.  ? ? ?Assessment & Plan:  ? ?Problem List Items Addressed This Visit   ? ?  ? Cardiovascular and Mediastinum  ? HTN (hypertension)  ?  Stable, continue current medications. ? ?  ?  ? Relevant Medications  ? amLODipine (NORVASC) 5 MG tablet  ? Other Relevant Orders  ? CBC w/Diff/Platelet  ? COMPLETE METABOLIC PANEL WITH GFR  ? Chronic diastolic CHF (congestive heart failure) ([place])  ?  Reviewed last echo, EF 55-60%. Continue Lasix 40 mg.  ?  ?  ? Relevant Medications  ? amLODipine (NORVASC) 5 MG tablet  ?  ? Endocrine  ? Diabetes mellitus ([place]) - Primary  ?  Recheck A1c, microalbumin. Referral placed to Ophthalmology. Uncertain exact insulin doses, will follow up in 2 weeks and bring all medications. Has diabetic polyneuropathy, large diabetic ulcer, following with podiatry.  ?  ?  ? Relevant Orders  ? Lipid Profile  ? HgB A1c  ? Urine Microalbumin w/creat. ratio  ? Ambulatory referral to Ophthalmology  ? HM Diabetes Foot Exam (Completed)  ?  ? Musculoskeletal and Integument  ? Arthritis, degenerative  ?  Following with pain management.  ?  ?  ? Relevant Medications  ? baclofen (LIORESAL) 10 MG tablet  ?  ? Other  ? Anxiety and depression  ?  Following with Psychiatry.  ?  ?  ? CML (chronic myelocytic leukemia) ([place])  ?  Following with Oncology.  ?  ?  ? Relevant Medications  ? amoxicillin-clavulanate (AUGMENTIN)  875-125 MG tablet  ? Hypercholesterolemia  ?  Uncertain if she is on a statin, medication list incorrect. Will recheck labs, she will bring all medications to follow up.  ?  ?  ? Relevant Medications  ? amLODipine (N

## 2021-06-12 NOTE — Assessment & Plan Note (Signed)
Stable, continue Amlodipine 5, Lasix 40 mg . ?

## 2021-06-12 NOTE — Assessment & Plan Note (Signed)
Stable, no changes to medications made, continue Amlodipine 5, Lasix 40 mg. ?

## 2021-06-12 NOTE — Assessment & Plan Note (Signed)
Following with Psychiatry, medications reviewed.  ?

## 2021-06-12 NOTE — Assessment & Plan Note (Signed)
Following with pain management, reviewed medications.  ?

## 2021-06-12 NOTE — Progress Notes (Signed)
Virtual Visit via Video Note ? ?I connected with Tara Levine on 06/12/21 at  3:40 PM EDT by a video enabled telemedicine application and verified that I am speaking with the correct person using two identifiers. ? ?Location: ?Patient: Home ?Provider: [facility] ?  ?I discussed the limitations of evaluation and management by telemedicine and the availability of in person appointments. The patient expressed understanding and agreed to proceed. ? ?History of Present Illness: ? ?Tara Levine is a 66 year old female presenting via telemedicine for follow up and  ? ?Diabetes, Type 2: ?-First diagnosed in 2011 ?-Last A1c 3/23 8.4% ?-Medications: Lantus 25 units at night, Novolog 10 units at meals, also on Trulicity (highest dose) but out of this medication, last dose 3 weeks ago. Cannot tolerate Metformin due to GI side effects.  ?-Patient is compliant with the above medications and reports no side effects. ?-Checking BG at home: 136-166 in the morning, checks post-prandials occasionally. No low blood sugars currently  ?-Diet: eats oatmeal, cornbread and black eyes peas  ?-Eye exam: Due ?-Foot exam: Seeing Podiatry ?-Microalbumin: UTD 3/23 ?-Statin: no ?-PNA vaccine: 13 in 2015, declines any further pneumonia vaccines  ?-Has symptoms of diabetic neuropathy on Lyrica, also follows with podiatry for ulcer on right foot ? ?Hypertension/CHF: ?-Medications: Amlodipine 5, Lasix 40 mg  ?-Patient is compliant with above medications and reports no side effects. ?-Checking BP at home (average): 136/72 ?-Denies any SOB, CP, vision changes, LE edema or symptoms of hypotension ?-last echo /22 EF 55-60% ? ?HLD: ?-Medications: Lipitor 20 mg,  ?-Patient is compliant with above medications and reports no side effects.  ?-Last lipid panel: Lipid Panel  ?   ?Component Value Date/Time  ? CHOL 130 05/22/2021 1446  ? TRIG 166 (H) 05/22/2021 1446  ? TRIG 418 (H) 06/01/2012 1224  ? HDL 42 (L) 05/22/2021 1446  ? CHOLHDL 3.1 05/22/2021 1446  ?  [place] 64 05/22/2021 1446  ? ?CML: ?-Following with Oncology, taking Bosulif 400 mg daily ?-Following up next month  ? ?MDD: ?-Following with Psychiatry  ?-Current treatment: Cymbalta 20 TID and 30 at night, xanax TID ? ? ?  06/12/2021  ?  3:28 PM 05/22/2021  ?  1:24 PM  ?Depression screen PHQ 2/9  ?Decreased Interest 0 1  ?Down, Depressed, Hopeless 0 2  ?PHQ - 2 Score 0 3  ?Altered sleeping 0 1  ?Tired, decreased energy 0 3  ?Change in appetite 0 3  ?Feeling bad or failure about yourself  0 3  ?Trouble concentrating 0 3  ?Moving slowly or fidgety/restless 0 1  ?Suicidal thoughts 0 0  ?PHQ-9 Score 0 17  ?Difficult doing work/chores Not difficult at all Somewhat difficult  ? ?Chronic Pain/OA: ?-Pain in back, shoulders and neck ?-Follows with pain management for opioid medications ?-Also on Celebrex through Rheumatology  ? ?GERD: ?-Currently on Prilosec 40 mg daily, controlling symptoms well   ? ?Cervical Lymphadenopathy and Productive Cough: ?-Chest x-ray obtained 05/22/21 negative for pneumonia ?-Treated lymphadenopathy with Augmentin, today she states symptoms have resolved  ? ?Right Great Toe: ?-Being treated for cellulitis with Augmentin for 10 days by podiatry  ?-Wound culture pending  ?-Following up on 4/27 with podiatry, wound care appointment on 4/21 ? ?Health Maintenance:  ?-Blood work UTD ?-Mammogram/DEXA due, patient declines further screening  ?-Colon cancer screening: due  ? ?Observations/Objective: ? ?General: well appearing, no acute distress ?ENT: conjunctiva normal appearing bilaterally  ?Skin: right great toe wound wrapped with gauze, but appears to be bleeding/draining through  gauze, about to change it ? ?Assessment and Plan: ? ?Problem List Items Addressed This Visit   ? ?  ? Cardiovascular and Mediastinum  ? Chronic diastolic CHF (congestive heart failure) ([place])  ?  Stable, no changes to medications made, continue Amlodipine 5, Lasix 40 mg. ? ?  ?  ? Relevant Medications  ? atorvastatin (LIPITOR)  20 MG tablet  ? Essential (primary) hypertension  ?  Stable, continue Amlodipine 5, Lasix 40 mg . ? ?  ?  ? Relevant Medications  ? atorvastatin (LIPITOR) 20 MG tablet  ?  ? Digestive  ? Gastroesophageal reflux disease  ?  Stable on Prilosec 40 mg daily. ? ?  ?  ?  ? Endocrine  ? Uncontrolled type 2 diabetes mellitus with hyperglycemia, with long-term current use of insulin ([place]) - Primary  ?  A1c 8.4%. Ran out of Trulicity over last 3 weeks. Continue Lantus 25 units at night, Novolog 10 units at meals. Start Ozempic, 0.25 mg for 4 weeks, then increase to 0.5 mg for 4 weeks, then recheck A1c and can go up from there. Educated on lifestyle management and dietary changes.  ? ?  ?  ? Relevant Medications  ? NOVOLOG FLEXPEN 100 UNIT/ML FlexPen  ? insulin glargine (LANTUS) 100 UNIT/ML injection  ? atorvastatin (LIPITOR) 20 MG tablet  ? Semaglutide,0.25 or 0.'5MG'$ /DOS, (OZEMPIC, 0.25 OR 0.5 MG/DOSE,) 2 MG/1.5ML SOPN  ?  ? Other  ? Chronic generalized pain  ?  Following with pain management, reviewed medications.  ? ?  ?  ? Relevant Medications  ? celecoxib (CELEBREX) 100 MG capsule  ? CML (chronic myelocytic leukemia) ([place])  ?  Following with Oncology, reviewed medications.  ? ?  ?  ? Relevant Medications  ? celecoxib (CELEBREX) 100 MG capsule  ? amoxicillin-clavulanate (AUGMENTIN) 875-125 MG tablet  ? Hypercholesterolemia  ?  Refilled Lipitor 20 mg today. ? ?  ?  ? Relevant Medications  ? atorvastatin (LIPITOR) 20 MG tablet  ? Major depression, recurrent, chronic ([place])  ?  Following with Psychiatry, medications reviewed.  ? ?  ?  ? ?Other Visit Diagnoses   ? ? Lymphadenopathy    - resolved with antibiotic.   ? Wound cellulitis    - on Augmentin currently, seeing wound care and podiatry.   ? Colon cancer screening      ? Relevant Orders  ? Ambulatory referral to Gastroenterology  ? ?  ? ? ?Follow Up Instructions: 2 months in person but after 6/28 for A1c check.  ? ?  ?I discussed the assessment and treatment plan with the  patient. The patient was provided an opportunity to ask questions and all were answered. The patient agreed with the plan and demonstrated an understanding of the instructions. ?  ?The patient was advised to call back or seek an in-person evaluation if the symptoms worsen or if the condition fails to improve as anticipated. ? ?I provided 40 minutes of non-face-to-face time during this encounter. ? ? ?Teodora Medici, DO ? ?

## 2021-06-12 NOTE — Assessment & Plan Note (Signed)
Refilled Lipitor 20 mg today. ?

## 2021-06-12 NOTE — Telephone Encounter (Signed)
Reason for Disposition ? Wound doesn't sound infected ?   See notes for details.   This call was not about her foot.   She wanted to change the appt from an in office appt to a virtual visit. ? ?Answer Assessment - Initial Assessment Questions ?1. LOCATION: "Where is the wound located?"  ?    She saw my toe when I was there [name].  Dr. Cleda Mccreedy is treating it.    He put a bandage on it with a stretchy tape.   It was cutting off circulation in my toe.   It turned black and purple.   It opened up the wound.   He put me on a 10 day Amoxicillin.   He caused it because he wrapped it too tight. ?I have diabetes.   ?2. WOUND APPEARANCE: "What does the wound look like?"  ?    I don't feel good like coming in.  I'm tired.   I need my atorvastatin.    ?Dr. Cleda Mccreedy is taking care of the toe issue.    I just want an appt today.   Ivirtually.    ?It's a f/u visit.    I don't have any energy.   I have lymphema and diabetes and I'm so tired.  I can't even get out of the door I'm so weak.    I'm overwhelmed at leaving the house with the shape I'm in.    ? ?This appt is not for the wound on her foot.   Dr. Cleda Mccreedy is taking care of the foot wound.   This is a f/u appt where I'm establishing care with [name].   However I'm wanting to see if she will do a phone call visit because I don't feel like coming into the office today. ? ?When I checked the appt she is already scheduled for a phone call appt with [name] for 3:40 today.   She wants to keep that appt so triage ended at this point.  Pt was agreeable with this plan.    "I just wanted to make it a phone appt, that's all".   ?3. SIZE: If redness is present, ask: "What is the size of the red area?" (Inches, centimeters, or compare to size of a coin)  ?    *No Answer* ?4. SPREAD: "What's changed in the last day?"  "Do you see any red streaks coming from the wound?" ?    *No Answer* ?5. ONSET: "When did it start to look infected?"  ?    *No Answer* ?6. MECHANISM: "How did the  wound start, what was the cause?" ?    *No Answer* ?7. PAIN: "Is there any pain?" If Yes, ask: "How bad is the pain?"   (Scale 1-10; or mild, moderate, severe) ?    *No Answer* ?8. FEVER: "Do you have a fever?" If Yes, ask: "What is your temperature, how was it measured, and when did it start?" ?    *No Answer* ?9. OTHER SYMPTOMS: "Do you have any other symptoms?" (e.g., shaking chills, weakness, rash elsewhere on body) ?    *No Answer* ?10. PREGNANCY: "Is there any chance you are pregnant?" "When was your last menstrual period?" ?      *No Answer* ? ?Protocols used: Wound Infection-A-AH ? ?

## 2021-06-12 NOTE — Patient Instructions (Addendum)
It was great seeing you today! ? ?Plan discussed at today's visit: ?-Stop Trulicity, start Ozempic 0.25 mg injected once a week for 4 weeks, then increase to 0.5 mg injected once a week for another 4 weeks. ?-Continue Lantus 25 units at night, Novolog 10 units at meals ?-Cholesterol medication refilled ?-GI referral placed for colonoscopy  ? ?Follow up in: 2 months  ? ?Take care and let us know if you have any questions or concerns prior to your next visit. ? ?[name] ? ? ?

## 2021-06-12 NOTE — Telephone Encounter (Signed)
Elta Guadeloupe called requesting to speak to [facility] regarding Rx clarification for this patient. Elta Guadeloupe is from the [employer], tried Personal assistant.  ? ?Best contact: [phone] ?

## 2021-06-12 NOTE — Telephone Encounter (Signed)
?  Chief Complaint: Pt wanted to change in office visit for today at 3:40 to a phone call visit for her f/u appt with [name].   Pt mentioned a foot wound so pt was transferred to nurse triage however another dr is managing the foot wound not [name].   Appt had already been changed to a phone appt. ?Symptoms: She didn't feel like coming into the office today.   Tired and doesn't feel well. ?Frequency: N/A ?Pertinent Negatives: Patient denies N/A ?Disposition: '[]'$ ED /'[]'$ Urgent Care (no appt availability in office) / '[x]'$ Appointment(In office/virtual)/ '[]'$  [facility]/ '[]'$ Home Care/ '[]'$ Refused Recommended Disposition /'[]'$ [facility]/ '[]'$  Follow-up with PCP ?Additional Notes: Agent changed appt to a phone call visit from an in office  visit at pt's request.     ?

## 2021-06-12 NOTE — Assessment & Plan Note (Signed)
A1c 8.4%. Ran out of Trulicity over last 3 weeks. Continue Lantus 25 units at night, Novolog 10 units at meals. Start Ozempic, 0.25 mg for 4 weeks, then increase to 0.5 mg for 4 weeks, then recheck A1c and can go up from there. Educated on lifestyle management and dietary changes.  ?

## 2021-06-12 NOTE — Assessment & Plan Note (Signed)
Stable on Prilosec 40 mg daily. 

## 2021-06-13 ENCOUNTER — Other Ambulatory Visit: Payer: Self-pay

## 2021-06-13 DIAGNOSIS — Z1211 Encounter for screening for malignant neoplasm of colon: Secondary | ICD-10-CM

## 2021-06-13 MED ORDER — PEG 3350-KCL-NA BICARB-NACL 420 G PO SOLR: 4000.0000 mL | Freq: Once | ORAL | 0 refills | Status: AC

## 2021-06-13 NOTE — Progress Notes (Signed)
Gastroenterology Pre-Procedure Review ? ?Request Date: 06/25/21 ?Requesting Physician: Dr. Vicente Males ? ?PATIENT REVIEW QUESTIONS: The patient responded to the following health history questions as indicated:   ? ?1. Are you having any GI issues? yes (pain on the right side. Pt declined office visit to discuss) ?2. Do you have a personal history of Polyps? yes (over 20 years performed in [facility] 2014 at [facility] had an intestinal infection) ?3. Do you have a family history of Colon Cancer or Polyps? no ?4. Diabetes Mellitus? yes (type 2) ?5. Joint replacements in the past 12 months? Back surgery in August 2022 ?6. Major health problems in the past 3 months?no ?7. Any artificial heart valves, MVP, or defibrillator?no ?   ?MEDICATIONS & ALLERGIES:    ?Patient reports the following regarding taking any anticoagulation/antiplatelet therapy:   ?Plavix, Coumadin, Eliquis, Xarelto, Lovenox, Pradaxa, Brilinta, or Effient? no ?Aspirin? no ? ?Patient confirms/reports the following medications:  ?Current Outpatient Medications  ?Medication Sig Dispense Refill  ? ACCU-CHEK GUIDE test strip TEST ONCE DAILY AND AS DIRECTED.  3  ? ACCU-CHEK SOFTCLIX LANCETS lancets Use 1 each 3 (three) times daily. Use as instructed.    ? albuterol (PROVENTIL HFA;VENTOLIN HFA) 108 (90 BASE) MCG/ACT inhaler Inhale 2 puffs into the lungs every 6 (six) hours as needed for wheezing or shortness of breath.    ? albuterol (PROVENTIL) (2.5 MG/3ML) 0.083% nebulizer solution INHALE CONTENTS OF 1 VIAL VIA NEBULIZER THREE TIMES A DAY prn    ? ALPRAZolam (XANAX) 1 MG tablet Take 1 tablet (1 mg total) by mouth 3 (three) times daily. 90 tablet 5  ? amLODipine (NORVASC) 5 MG tablet Take 1 tablet (5 mg total) by mouth daily. 90 tablet 0  ? amoxicillin-clavulanate (AUGMENTIN) 875-125 MG tablet     ? Ascorbic Acid (VITAMIN C) 1000 MG tablet Take 3,000 mg by mouth daily.    ? atorvastatin (LIPITOR) 20 MG tablet Take 1 tablet (20 mg total) by mouth daily. 90 tablet 3  ? baclofen  (LIORESAL) 10 MG tablet Take 10 mg by mouth 2 (two) times daily. 1/2 tab    ? Blood Glucose Monitoring Suppl (GLUCOCOM BLOOD GLUCOSE MONITOR) DEVI     ? bosutinib (BOSULIF) 400 MG tablet Take 1 tablet (400 mg total) by mouth daily with breakfast. 30 tablet 1  ? celecoxib (CELEBREX) 100 MG capsule     ? cetirizine (ZYRTEC) 10 MG tablet Take 20 mg by mouth daily as needed.     ? Cholecalciferol (VITAMIN D PO) Take 1 tablet by mouth daily.    ? co-enzyme Q-10 50 MG capsule Take 50 mg by mouth daily.    ? diphenhydrAMINE (BENADRYL) 25 MG tablet Take 25 mg by mouth every 6 (six) hours as needed for itching or allergies.     ? DULoxetine (CYMBALTA) 20 MG capsule Take 1 capsule (20 mg total) by mouth in the morning, at noon, in the evening, and at bedtime. 120 capsule 5  ? DULoxetine (CYMBALTA) 30 MG capsule Take 1 capsule (30 mg total) by mouth at bedtime. 30 capsule 5  ? fluticasone (FLONASE) 50 MCG/ACT nasal spray Place 1-2 sprays into both nostrils as needed for rhinitis.     ? furosemide (LASIX) 40 MG tablet Take 1 tablet (40 mg total) by mouth daily. 30 tablet 3  ? guaiFENesin (MUCINEX) 600 MG 12 hr tablet Take 1 tablet (600 mg total) by mouth daily. 30 tablet 0  ? insulin aspart (NOVOLOG) 100 UNIT/ML injection Inject 10 Units into  the skin 3 (three) times daily as needed for high blood sugar. Pt reports she is taking 2-8 units per sliding scale based on glucose    ? insulin glargine (LANTUS) 100 UNIT/ML injection Inject 0.37 mLs (37 Units total) into the skin daily. (Patient taking differently: Inject 25 Units into the skin daily.) 10 mL 11  ? insulin glargine (LANTUS) 100 UNIT/ML injection Inject 25 Units into the skin daily.    ? NOVOLOG FLEXPEN 100 UNIT/ML FlexPen Inject into the skin.    ? Omega-3 Fatty Acids (OMEGA 3 PO) Take 3 capsules by mouth daily.    ? omeprazole (PRILOSEC) 40 MG capsule Take 40 mg by mouth daily. Reported on 04/21/2015    ? Oxycodone HCl 10 MG TABS Take 10 mg by mouth as needed.    ?  polyethylene glycol (MIRALAX MIX-IN PAX) 17 g packet Take 17 g by mouth daily as needed. 14 each 0  ? pregabalin (LYRICA) 300 MG capsule TAKE 1 CAPSULE BY MOUTH TWICE A DAY 60 capsule 5  ? Semaglutide,0.25 or 0.'5MG'$ /DOS, (OZEMPIC, 0.25 OR 0.5 MG/DOSE,) 2 MG/1.5ML SOPN Inject 0.25 mg into the skin once a week for 30 days, THEN 0.5 mg once a week. 2.25 mL 0  ? vitamin B-12 (CYANOCOBALAMIN) 1000 MCG tablet Take 4,000 mcg by mouth daily.    ? ?No current facility-administered medications for this visit.  ? ? ?Patient confirms/reports the following allergies:  ?Allergies  ?Allergen Reactions  ? Morphine Rash  ?  Headache too  ? Codeine Other (See Comments)  ?  Reaction: Unknown   ? Latex Other (See Comments)  ?  Reaction:  Unknown   ? Morphine And Related Other (See Comments)  ?  Reaction:  Unknown   ? Relafen [Nabumetone] Hives  ? Nsaids Rash  ? Tape Other (See Comments) and Rash  ?  Reaction:  Unknown   ? ? ?No orders of the defined types were placed in this encounter. ? ? ?AUTHORIZATION INFORMATION ?Primary Insurance: ?1D#: ?Group #: ? ?Secondary Insurance: ?1D#: ?Group #: ? ?SCHEDULE INFORMATION: ?Date: 06/25/21 ?Time: ?Location: [facility] ?

## 2021-06-15 ENCOUNTER — Ambulatory Visit: Payer: Medicare PPO | Admitting: Physician Assistant

## 2021-06-19 ENCOUNTER — Telehealth: Payer: Self-pay | Admitting: Internal Medicine

## 2021-06-19 NOTE — Telephone Encounter (Signed)
Jasmine from [facility] called and stated that the pt is taking Atorvastatin '20MG'$  / last picked up on 1.11.23 for a 90 day supply /  ?

## 2021-06-20 ENCOUNTER — Telehealth: Payer: Self-pay | Admitting: Gastroenterology

## 2021-06-20 ENCOUNTER — Other Ambulatory Visit: Payer: Self-pay

## 2021-06-20 ENCOUNTER — Telehealth: Payer: Self-pay | Admitting: Internal Medicine

## 2021-06-20 DIAGNOSIS — Z1211 Encounter for screening for malignant neoplasm of colon: Secondary | ICD-10-CM

## 2021-06-20 NOTE — Telephone Encounter (Signed)
Pt left message to cancel colononosocpy for 05//02/2021 will call back to reschedule ?

## 2021-06-20 NOTE — Telephone Encounter (Signed)
Copied from [place]. Topic: General - Other ?>> Jun 20, 2021  2:19 PM Leward Quan A wrote: ?Reason for CRM: Patient called in to inform Dr Rosana Berger that she was changed from her antibiotics Amoxicillin to a new Sulrur antibiotic and due to this she need to have her appointment that is scheduled for a colonoscopy on 06/25/21 changed. Patient also wanted Dr Rosana Berger to know that since the change in medication she have been sick tired, weak, lack of appetite, and broke out into a sweat this morning. Would like Dr Rosana Berger opinion on what to do.Please call patient at Ph# [phone] ?

## 2021-06-21 ENCOUNTER — Inpatient Hospital Stay: Payer: Medicare PPO | Attending: Oncology

## 2021-06-21 ENCOUNTER — Telehealth: Payer: Self-pay

## 2021-06-21 NOTE — Telephone Encounter (Signed)
Pt requested to cancel her colonoscopy.  She will call back to reschedule.  Notified Trish in Endo. ? ?Thanks, ?Sharyn Lull, CMA ?

## 2021-06-22 ENCOUNTER — Encounter: Payer: Self-pay | Admitting: Internal Medicine

## 2021-06-22 ENCOUNTER — Telehealth (INDEPENDENT_AMBULATORY_CARE_PROVIDER_SITE_OTHER): Payer: Medicare PPO | Admitting: Internal Medicine

## 2021-06-22 DIAGNOSIS — B379 Candidiasis, unspecified: Secondary | ICD-10-CM

## 2021-06-22 MED ORDER — NYSTATIN 100000 UNIT/GM EX POWD: 1.0000 "application " | Freq: Three times a day (TID) | CUTANEOUS | 0 refills | Status: DC

## 2021-06-22 MED ORDER — FLUCONAZOLE 150 MG PO TABS: 150.0000 mg | ORAL_TABLET | Freq: Once | ORAL | 0 refills | Status: AC

## 2021-06-22 NOTE — Progress Notes (Signed)
Virtual Visit via Video Note ? ?I connected with Tara Levine on 06/22/21 at 11:40 AM EDT by a video enabled telemedicine application and verified that I am speaking with the correct person using two identifiers. ? ?Location: ?Patient: Home ?Provider: [facility] ?  ?I discussed the limitations of evaluation and management by telemedicine and the availability of in person appointments. The patient expressed understanding and agreed to proceed. ? ?History of Present Illness: ? ?Tara Levine is a 66 year old female presenting via telemedicine for possible yeast infection after antibiotics.  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  She was seen by podiatry in the office on 06/07/2021, note reviewed. At that time, an x-ray was obtained which was negative for any osteolytic changes.  The tissue from the ulcer was debrided and cultures were obtained.  Cultures positive for MRSA and resistant to several antibiotics.  She was on Bactrim, however this antibiotic is resistant and she is now on Doxycycline for 28 days, however the patient states she is having issues tolerating this medication. She understands how if the infection spreads to her blood this could be life threatening and will continue to finish the entire antibiotic course. She denies fevers, chills but states there is still a purulent fluid draining from the wound, especially when she uses Neosporin. She is complaining today of a red itchy rash under her breasts as well as a vaginal yeast infection with discomfort and mild itchy. No changes in vaginal discharge.  ?  ?Observations/Objective: ? ?General: well appearing, no acute distress ?ENT: conjunctiva normal appearing bilaterally  ?Neuro: Answers all questions appropriately ? ? ?Assessment and Plan: ? ?1. Yeast infection: Yeast infection of skin under breasts as well as vaginal yeast infection secondary to multiple recent antibiotics due to infected wound.   We will treat with nystatin powder for under the breasts and skin as well as Diflucan for vaginal yeast infection.  She is following up with her podiatrist again on Monday, last note from 06/07/2021 reviewed as well as all cultures.  Regular medical follow-up scheduled for here in June. ? ?- fluconazole (DIFLUCAN) 150 MG tablet; Take 1 tablet (150 mg total) by mouth once for 1 dose.  Dispense: 3 tablet; Refill: 0 ?- nystatin (MYCOSTATIN/NYSTOP) powder; Apply 1 application. topically 3 (three) times daily.  Dispense: 15 g; Refill: 0 ? ?Follow Up Instructions: keep follow up in June ? ?  ?I discussed the assessment and treatment plan with the patient. The patient was provided an opportunity to ask questions and all were answered. The patient agreed with the plan and demonstrated an understanding of the instructions. ?  ?The patient was advised to call back or seek an in-person evaluation if the symptoms worsen or if the condition fails to improve as anticipated. ? ?I provided 11 minutes of non-face-to-face time during this encounter. ? ? ?Teodora Medici, DO ? ?

## 2021-06-25 ENCOUNTER — Ambulatory Visit: Admission: RE | Admit: 2021-06-25 | Payer: Medicare PPO | Source: Home / Self Care | Admitting: Gastroenterology

## 2021-06-25 ENCOUNTER — Encounter: Admission: RE | Payer: Self-pay | Source: Home / Self Care

## 2021-06-25 SURGERY — COLONOSCOPY WITH PROPOFOL
Anesthesia: General

## 2021-06-26 ENCOUNTER — Telehealth: Payer: Self-pay

## 2021-06-26 NOTE — Telephone Encounter (Signed)
Patient last had virtual visit on 3/8. She is scheduled for Mychart visit on 5/8 and missed lab appt on 4/27. This is a r/s appt. ? ?Per Dr. Collie Siad last note: recommend pt to follow up in 4-5 weeks with repeat blood work and in-person.  ? ?Please call to r/s labs and MD visit 1 week after labs (in person visit per MD)  ?

## 2021-06-28 NOTE — Telephone Encounter (Signed)
Jenn, do you mind reaching out to pt again to r/s appts.  ?

## 2021-07-02 ENCOUNTER — Telehealth: Payer: Medicare PPO | Admitting: Oncology

## 2021-07-03 ENCOUNTER — Inpatient Hospital Stay: Payer: Medicare PPO | Attending: Oncology

## 2021-07-10 ENCOUNTER — Inpatient Hospital Stay: Payer: Medicare PPO | Admitting: Oncology

## 2021-07-10 ENCOUNTER — Telehealth: Payer: Self-pay

## 2021-07-10 NOTE — Telephone Encounter (Signed)
Please cancel today's appt. Patient did not have labs done.  ? ?Please call to r/s lab/MD (same day). Appointment needs to be In person per MD request. Thanks  ?

## 2021-07-16 ENCOUNTER — Ambulatory Visit: Payer: Self-pay

## 2021-07-16 NOTE — Telephone Encounter (Signed)
Chief Complaint: Medication changes Symptoms: None Frequency: N/A Pertinent Negatives: Patient denies symptoms Disposition: '[]'$ ED /'[]'$ Urgent Care (no appt availability in office) / '[x]'$ Appointment(In office/virtual)/ '[]'$  [place] [facility]/ '[]'$ Home Care/ '[]'$ Refused Recommended Disposition /'[]'$  Mobile Bus/ '[]'$  Follow-up with PCP Additional Notes: Called to schedule appointment to discuss changing diabetes medication. Transferred to NT because patient was SOB while on the phone with the agent. Patient says this is normal for her that she was not wearing her oxygen while on hold gathering supplies for a dressing change and became SOB. Denies SOB to NT, oxygen is on.   Reason for Disposition  Prescription request for new medicine (not a refill)  Answer Assessment - Initial Assessment Questions 1. NAME of MEDICATION: "What medicine are you calling about?"     Ozempic 2. QUESTION: "What is your question?" (e.g., double dose of medicine, side effect)     See is she could start on Jardiance 3. PRESCRIBING HCP: "Who prescribed it?" Reason: if prescribed by specialist, call should be referred to that group.     [name] 4. SYMPTOMS: "Do you have any symptoms?"     No 5. SEVERITY: If symptoms are present, ask "Are they mild, moderate or severe?"     N/A 6. PREGNANCY:  "Is there any chance that you are pregnant?" "When was your last menstrual period?"     N/A  Protocols used: Medication Question Call-A-AH

## 2021-07-17 ENCOUNTER — Telehealth (INDEPENDENT_AMBULATORY_CARE_PROVIDER_SITE_OTHER): Payer: Medicare PPO | Admitting: Internal Medicine

## 2021-07-17 ENCOUNTER — Encounter: Payer: Self-pay | Admitting: Internal Medicine

## 2021-07-17 ENCOUNTER — Other Ambulatory Visit: Payer: Self-pay

## 2021-07-17 ENCOUNTER — Other Ambulatory Visit: Payer: Self-pay | Admitting: Internal Medicine

## 2021-07-17 DIAGNOSIS — E1165 Type 2 diabetes mellitus with hyperglycemia: Secondary | ICD-10-CM

## 2021-07-17 DIAGNOSIS — I10 Essential (primary) hypertension: Secondary | ICD-10-CM | POA: Diagnosis not present

## 2021-07-17 DIAGNOSIS — B379 Candidiasis, unspecified: Secondary | ICD-10-CM

## 2021-07-17 DIAGNOSIS — E7800 Pure hypercholesterolemia, unspecified: Secondary | ICD-10-CM

## 2021-07-17 DIAGNOSIS — Z794 Long term (current) use of insulin: Secondary | ICD-10-CM

## 2021-07-17 MED ORDER — SEMAGLUTIDE (1 MG/DOSE) 4 MG/3ML ~~LOC~~ SOPN: 1.0000 mg | PEN_INJECTOR | SUBCUTANEOUS | 0 refills | Status: DC

## 2021-07-17 MED ORDER — INSULIN GLARGINE 100 UNIT/ML ~~LOC~~ SOLN: 20.0000 [IU] | Freq: Every day | SUBCUTANEOUS | 0 refills | Status: DC

## 2021-07-17 MED ORDER — FLUCONAZOLE 150 MG PO TABS: 150.0000 mg | ORAL_TABLET | Freq: Once | ORAL | 0 refills | Status: AC

## 2021-07-17 NOTE — Assessment & Plan Note (Signed)
Patient stopped her amlodipine 5 mg daily, she states it was making her dizzy.  She has not been checking her blood pressures at home.  I recommend she start checking her blood pressure every day, write this down and send me a MyChart message in 3 days with her average blood pressure.  If it is greater than 140/90, she will resume her amlodipine 5 mg at night.  Discussed how dizziness could be a variety of different things, not necessarily her blood pressure going low and she needs to monitor her blood pressure daily.  She will follow-up in 1 month for an in person exam and blood pressure check.

## 2021-07-17 NOTE — Assessment & Plan Note (Signed)
Stable.  Continue Lipitor 20 mg daily. 

## 2021-07-17 NOTE — Progress Notes (Signed)
Virtual Visit via Video Note  I connected with Tara Levine on 07/17/21 at 11:40 AM EDT by a video enabled telemedicine application and verified that I am speaking with the correct person using two identifiers.  Location: Patient: Home Provider: [facility]   I discussed the limitations of evaluation and management by telemedicine and the availability of in person appointments. The patient expressed understanding and agreed to proceed.  History of Present Illness:  Tara Levine is a 66 year old female presenting via telemedicine for follow up.  Diabetes, Type 2: -First diagnosed in 2011 -Last A1c 3/23 8.4% -Medications: Lantus 20 (down from 25 - patient changed herself) units at night, Novolog 6 units 5 times a day (patient changed herself from 10 units at meals). Ozempic started at last office visit, tolerating well. Cannot tolerate Metformin due to GI side effects.  -Patient reports no side effects from medications above. -Checking BG at home: says its "up and down", checks post-prandials occasionally. No low blood sugars currently  -Diet: eats oatmeal, cornbread and black eyes peas  -Eye exam: Due -Foot exam: Seeing Podiatry -Microalbumin: UTD 3/23 -Statin: no -PNA vaccine: 13 in 2015, declines any further pneumonia vaccines  -Has symptoms of diabetic neuropathy on Lyrica, also follows with podiatry for ulcer on right foot  Hypertension/CHF: -Medications: Patient stopped taking the Amlodipine 5 mg, as she felt like it was making her dizzy, Lasix 40 mg  -Not checking blood pressure at home -Denies any SOB, CP, vision changes, LE edema or symptoms of hypotension -Last echo /22 EF 55-60%  HLD: -Medications: Lipitor 20 mg -Patient is compliant with above medications and reports no side effects.  -Last lipid panel: Lipid Panel     Component Value Date/Time   CHOL 130 05/22/2021 1446   TRIG 166 (H) 05/22/2021 1446   TRIG 418 (H) 06/01/2012 1224   HDL 42 (L) 05/22/2021 1446    CHOLHDL 3.1 05/22/2021 1446   LDLCALC 64 05/22/2021 1446   Continues to follow with podiatry for wound of the great right toe.  She is on another round of antibiotics.  She states she is having some vaginal pain and change in vaginal discharge while on the antibiotics and is requesting treatment for vaginal yeast infection.  Following with Dr. Jens Som tomorrow.  Observations/Objective:  General: well appearing, no acute distress ENT: conjunctiva normal appearing bilaterally  Neuro: answers all questions appropriately   Assessment and Plan:  Problem List Items Addressed This Visit       Cardiovascular and Mediastinum   Essential (primary) hypertension    Patient stopped her amlodipine 5 mg daily, she states it was making her dizzy.  She has not been checking her blood pressures at home.  I recommend she start checking her blood pressure every day, write this down and send me a MyChart message in 3 days with her average blood pressure.  If it is greater than 140/90, she will resume her amlodipine 5 mg at night.  Discussed how dizziness could be a variety of different things, not necessarily her blood pressure going low and she needs to monitor her blood pressure daily.  She will follow-up in 1 month for an in person exam and blood pressure check.         Endocrine   Uncontrolled type 2 diabetes mellitus with hyperglycemia, with long-term current use of insulin ([place]) - Primary    Patient changed her insulins.  We will continue Lantus 20 units at night, she will resume taking her NovoLog  10 units 3 times a day with meals.  She was counseled that she must take a short acting insulin with meals or she will increase her risk of hypoglycemia.  We will increase her Ozempic dose to 1 mg weekly.  She will follow-up in 1 month for recheck A1c, at that time her Ozempic may be increased if she is tolerating it well.  We need to maximize the medication she is already on before starting a new medication.  She  needs to be checking her fasting blood sugars and at least 1 postprandial sugar.  Patient states she will be compliant with this plan.  Lantus refilled today.       Relevant Medications   Semaglutide, 1 MG/DOSE, 4 MG/3ML SOPN   insulin glargine (LANTUS) 100 UNIT/ML injection     Other   Hypercholesterolemia    Stable.  Continue Lipitor 20 mg daily.       Other Visit Diagnoses     Yeast infection    -Diflucan was sent to her pharmacy to take while on the antibiotic.   Relevant Medications   fluconazole (DIFLUCAN) 150 MG tablet       Follow Up Instructions: 1 month in person    I discussed the assessment and treatment plan with the patient. The patient was provided an opportunity to ask questions and all were answered. The patient agreed with the plan and demonstrated an understanding of the instructions.   The patient was advised to call back or seek an in-person evaluation if the symptoms worsen or if the condition fails to improve as anticipated.  I provided 40 minutes of non-face-to-face time during this encounter.   Tara Medici, DO

## 2021-07-17 NOTE — Patient Instructions (Addendum)
It was great seeing you today!  Plan discussed at today's visit: -Continue to monitor blood pressure at home, if blood pressure >140/90 start Amlodipine at night -Continue Lantus 20 units at night, 10 units of Novolog with meals -Ozempic increased to 1 mg weekly   Follow up in: 1 month   Take care and let us know if you have any questions or concerns prior to your next visit.  [name]

## 2021-07-17 NOTE — Assessment & Plan Note (Addendum)
Patient changed her insulins.  We will continue Lantus 20 units at night, she will resume taking her NovoLog 10 units 3 times a day with meals.  She was counseled that she must take a short acting insulin with meals or she will increase her risk of hypoglycemia.  We will increase her Ozempic dose to 1 mg weekly.  She will follow-up in 1 month for recheck A1c, at that time her Ozempic may be increased if she is tolerating it well.  We need to maximize the medication she is already on before starting a new medication.  She needs to be checking her fasting blood sugars and at least 1 postprandial sugar.  Patient states she will be compliant with this plan.  Lantus refilled today.

## 2021-07-18 ENCOUNTER — Other Ambulatory Visit: Payer: Self-pay

## 2021-07-18 NOTE — Telephone Encounter (Signed)
Requested medication (s) are due for refill today: No  Requested medication (s) are on the active medication list: Yes  Last refill:  07/17/21  Future visit scheduled:   Notes to clinic:  Pharmacy asking for verification - insulin comes in 10 ml vial or pen?    Requested Prescriptions  Pending Prescriptions Disp Refills   LANTUS 100 UNIT/ML injection [Pharmacy Med Name: LANTUS 100 UNIT/ML VIAL] 18 mL 0    Sig: INJECT 0.2 MLS (20 UNITS TOTAL) INTO THE SKIN DAILY.     Endocrinology:  Diabetes - Insulins Failed - 07/17/2021 12:19 PM      Failed - HBA1C is between 0 and 7.9 and within 180 days    Hgb A1c MFr Bld  Date Value Ref Range Status  05/22/2021 8.4 (H) <5.7 % of total Hgb Final    Comment:    For someone without known diabetes, a hemoglobin A1c value of 6.5% or greater indicates that they may have  diabetes and this should be confirmed with a follow-up  test. . For someone with known diabetes, a value <7% indicates  that their diabetes is well controlled and a value  greater than or equal to 7% indicates suboptimal  control. A1c targets should be individualized based on  duration of diabetes, age, comorbid conditions, and  other considerations. . Currently, no consensus exists regarding use of hemoglobin A1c for diagnosis of diabetes for children. Renella Cunas - Valid encounter within last 6 months    Recent Outpatient Visits           Yesterday Uncontrolled type 2 diabetes mellitus with hyperglycemia, with long-term current use of insulin [facility])   [facility] Teodora Medici, DO   3 weeks ago Yeast infection   [facility] [place], [place], DO   1 month ago Uncontrolled type 2 diabetes mellitus with hyperglycemia, with long-term current use of insulin [facility]   [facility] Teodora Medici, DO   1 month ago Type 2 diabetes mellitus with hyperglycemia, unspecified whether long term insulin use  [facility]   [facility] Teodora Medici, DO       Future Appointments             In 1 month Teodora Medici, [facility], [facility]

## 2021-07-23 LAB — COLOGUARD

## 2021-07-24 ENCOUNTER — Other Ambulatory Visit: Payer: Self-pay | Admitting: Internal Medicine

## 2021-07-24 DIAGNOSIS — Z1211 Encounter for screening for malignant neoplasm of colon: Secondary | ICD-10-CM

## 2021-07-26 ENCOUNTER — Other Ambulatory Visit: Payer: Self-pay | Admitting: Nurse Practitioner

## 2021-07-26 ENCOUNTER — Telehealth: Payer: Self-pay | Admitting: Internal Medicine

## 2021-07-26 MED ORDER — LANTUS SOLOSTAR 100 UNIT/ML ~~LOC~~ SOPN: 20.0000 [IU] | PEN_INJECTOR | Freq: Every day | SUBCUTANEOUS | 0 refills | Status: DC

## 2021-07-26 NOTE — Telephone Encounter (Signed)
Pt states the wrong medication was sent in. The insulin glargine (LANTUS) 100 UNIT/ML injection was sent in as a vial, and pt has never done the vial.  Pt needs Solostar insulin glargine (LANTUS) 100 UNIT/ML injection 25 units pen  [id number] [place], [place] - [address]

## 2021-07-27 ENCOUNTER — Other Ambulatory Visit: Payer: Self-pay

## 2021-07-27 NOTE — Telephone Encounter (Signed)
Pt is calling to report that her insurance does not want to pay for the insulin glargine-yfgn (SEMGLEE) 100 UNIT/ML Pen [[id number]] . Pt is interested in going to the [place]  Pt has not had long acting insulin 2 days.  CB- [phone]

## 2021-07-27 NOTE — Telephone Encounter (Signed)
Requested medication (s) are due for refill today: na  Requested medication (s) are on the active medication list: no  Last refill:  lantus glargine - 07/26/21 #15 ml 0 refills   Future visit scheduled: yes in 4 weeks   Notes to clinic:  medication not assigned to a protocol. Pharmacy comment: Alternative Requested:THE PRESCRIBED MEDICATION IS NOT COVERED BY INSURANCE. PLEASE CONSIDER CHANGING TO ONE OF THE SUGGESTED COVERED ALTERNATIVES. Please advise      Requested Prescriptions  Pending Prescriptions Disp Refills   insulin glargine-yfgn (SEMGLEE) 100 UNIT/ML Pen [Pharmacy Med Name: INSULIN GLARGINE-YFGN U100 PEN]  0    Sig: INJECT 20 UNITS INTO THE SKIN DAILY     Off-Protocol Failed - 07/26/2021  4:32 PM      Failed - Medication not assigned to a protocol, review manually.      Passed - Valid encounter within last 12 months    Recent Outpatient Visits           1 week ago Uncontrolled type 2 diabetes mellitus with hyperglycemia, with long-term current use of insulin [facility])   [facility] Teodora Medici, DO   1 month ago Yeast infection   [facility] [place], [place], DO   1 month ago Uncontrolled type 2 diabetes mellitus with hyperglycemia, with long-term current use of insulin [facility])   [facility] Teodora Medici, DO   2 months ago Type 2 diabetes mellitus with hyperglycemia, unspecified whether long term insulin use [facility])   [facility] Teodora Medici, DO       Future Appointments             In 4 weeks Teodora Medici, [facility], [facility]

## 2021-07-27 NOTE — Telephone Encounter (Signed)
New rx sent for lantus solostar

## 2021-07-30 ENCOUNTER — Other Ambulatory Visit: Payer: Self-pay | Admitting: Internal Medicine

## 2021-07-30 ENCOUNTER — Ambulatory Visit: Payer: Self-pay

## 2021-07-30 NOTE — Telephone Encounter (Signed)
Called pharmacy they cover: lantus vial, but pt does not want this.   Tresiba flex touch, levemir flexpen or tuejuo solostar

## 2021-07-30 NOTE — Telephone Encounter (Signed)
  Chief Complaint: Insulin issues Symptoms: High blood Frequency: ongoing Pertinent Negatives: Patient denies SOB,  Disposition: '[]'$ ED /'[]'$ Urgent Care (no appt availability in office) / '[]'$ Appointment(In office/virtual)/ '[]'$  [facility] [facility]/ '[]'$ Home Care/ '[]'$ Refused Recommended Disposition /'[]'$ [place] [facility]/ '[x]'$  Follow-up with PCP Additional Notes: Pt called regarding insulin. PT does not want to take any more insulin. She cannot see the small numbers to draw up the medicine, and does not want to constantly "stick " herself. She stated that she will continue the weekly shot, but wants "the pill" for DM that her son took that replaces insulin.   Please return pt's call. Pt does not want to see any other provider's other than [name].  Reason for Disposition  [1] Caller has URGENT medicine question about med that PCP or specialist prescribed AND [2] triager unable to answer question  Answer Assessment - Initial Assessment Questions 1. NAME of MEDICATION: "What medicine are you calling about?"     Insulin 2. QUESTION: "What is your question?" (e.g., double dose of medicine, side effect)     Does not want insulin any longer 3. PRESCRIBING HCP: "Who prescribed it?" Reason: if prescribed by specialist, call should be referred to that group.      4. SYMPTOMS: "Do you have any symptoms?"      5. SEVERITY: If symptoms are present, ask "Are they mild, moderate or severe?"      6. PREGNANCY:  "Is there any chance that you are pregnant?" "When was your last menstrual period?"  Protocols used: Medication Question Call-A-AH

## 2021-07-31 NOTE — Telephone Encounter (Signed)
Requested medication (s) are due for refill today:   Yes  Requested medication (s) are on the active medication list:   Yes  Future visit scheduled:   Yes tomorrow   Last ordered: Pharmacy requesting an alternative because not covered.   See pharmacy note for alternatives  Requested Prescriptions  Pending Prescriptions Disp Refills   LANTUS 100 UNIT/ML injection [Pharmacy Med Name: LANTUS 100 UNIT/ML VIAL]  0     Endocrinology:  Diabetes - Insulins Failed - 07/30/2021 10:17 AM      Failed - HBA1C is between 0 and 7.9 and within 180 days    Hgb A1c MFr Bld  Date Value Ref Range Status  05/22/2021 8.4 (H) <5.7 % of total Hgb Final    Comment:    For someone without known diabetes, a hemoglobin A1c value of 6.5% or greater indicates that they may have  diabetes and this should be confirmed with a follow-up  test. . For someone with known diabetes, a value <7% indicates  that their diabetes is well controlled and a value  greater than or equal to 7% indicates suboptimal  control. A1c targets should be individualized based on  duration of diabetes, age, comorbid conditions, and  other considerations. . Currently, no consensus exists regarding use of hemoglobin A1c for diagnosis of diabetes for children. Renella Cunas - Valid encounter within last 6 months    Recent Outpatient Visits           2 weeks ago Uncontrolled type 2 diabetes mellitus with hyperglycemia, with long-term current use of insulin [facility])   [facility] Teodora Medici, DO   1 month ago Yeast infection   [facility] [place], [place]   1 month ago Uncontrolled type 2 diabetes mellitus with hyperglycemia, with long-term current use of insulin [facility])   [facility] Teodora Medici, DO   2 months ago Type 2 diabetes mellitus with hyperglycemia, unspecified whether long term insulin use [facility])   [facility] Teodora Medici, DO       Future Appointments             Tomorrow Teodora Medici, [facility], [place]   In 3 weeks Teodora Medici, [facility], [facility]

## 2021-08-01 ENCOUNTER — Other Ambulatory Visit: Payer: Self-pay

## 2021-08-01 ENCOUNTER — Encounter: Payer: Self-pay | Admitting: Internal Medicine

## 2021-08-01 ENCOUNTER — Telehealth (INDEPENDENT_AMBULATORY_CARE_PROVIDER_SITE_OTHER): Payer: Medicare PPO | Admitting: Internal Medicine

## 2021-08-01 DIAGNOSIS — E1165 Type 2 diabetes mellitus with hyperglycemia: Secondary | ICD-10-CM | POA: Diagnosis not present

## 2021-08-01 DIAGNOSIS — Z794 Long term (current) use of insulin: Secondary | ICD-10-CM

## 2021-08-01 DIAGNOSIS — C9210 Chronic myeloid leukemia, BCR/ABL-positive, not having achieved remission: Secondary | ICD-10-CM

## 2021-08-01 MED ORDER — TRESIBA FLEXTOUCH 100 UNIT/ML ~~LOC~~ SOPN: 25.0000 [IU] | PEN_INJECTOR | Freq: Every day | SUBCUTANEOUS | 2 refills | Status: DC

## 2021-08-01 NOTE — Progress Notes (Signed)
Virtual Visit via Video Note  I connected with Tara Levine on 08/01/21 at 11:40 AM EDT by a video enabled telemedicine application and verified that I am speaking with the correct person using two identifiers.  Location: Patient: Home Provider: [facility]   I discussed the limitations of evaluation and management by telemedicine and the availability of in person appointments. The patient expressed understanding and agreed to proceed.  History of Present Illness:  Tara Levine is a 66 year old female presenting via telemedicine for follow up.  Diabetes, Type 2: -First diagnosed in 2011 -Last A1c 3/23 8.4% -Medications: Lantus 20 (down from 25 - patient changed herself) units at night, Novolog 10 units TID with meals. Ozempic increased to 1 mg weekly. Cannot tolerate Metformin due to GI side effects.  -Patient reports no side effects from medications above. -Checking BG at home: fasting sugar 216 this morning; post-prandial 290-310  -Diet: eats oatmeal, cornbread and black eyes peas. Decreased dietary sugar and increased fruit intake  -Eye exam: Due -Foot exam: Seeing Podiatry -Microalbumin: UTD 3/23 -Statin: no -PNA vaccine: 13 in 2015, declines any further pneumonia vaccines  -Has symptoms of diabetic neuropathy on Lyrica, also follows with podiatry for ulcer on right foot  Continues to follow with podiatry for wound of the great right toe.  She has finished multiple rounds of antibiotics.   Following with Dr. Jens Som, visits now every 4 weeks which is an improvement.   Observations/Objective:  General: well appearing, no acute distress ENT: conjunctiva normal appearing bilaterally  Neuro: answers all questions appropriately   Assessment and Plan:  1. Uncontrolled type 2 diabetes mellitus with hyperglycemia, with long-term current use of insulin ([place]): Due to insurance coverage and patient request, will switch from Lantus to [place]. Increase dose slightly to [place] 25 units daily.  Continue Novolog 20 units TID with meals. Continue Ozempic 1 mg weekly. Follow up in 3 weeks for A1c check.   - insulin degludec (TRESIBA FLEXTOUCH) 100 UNIT/ML FlexTouch Pen; Inject 25 Units into the skin daily.  Dispense: 7.5 mL; Refill: 2  Follow Up Instructions: 3 weeks in person, already scheduled     I discussed the assessment and treatment plan with the patient. The patient was provided an opportunity to ask questions and all were answered. The patient agreed with the plan and demonstrated an understanding of the instructions.   The patient was advised to call back or seek an in-person evaluation if the symptoms worsen or if the condition fails to improve as anticipated.  I provided 40 minutes of non-face-to-face time during this encounter.   Teodora Medici, DO

## 2021-08-02 ENCOUNTER — Inpatient Hospital Stay: Payer: Medicare PPO

## 2021-08-02 ENCOUNTER — Telehealth: Payer: Self-pay | Admitting: Oncology

## 2021-08-02 ENCOUNTER — Inpatient Hospital Stay: Payer: Medicare PPO | Admitting: Oncology

## 2021-08-02 NOTE — Telephone Encounter (Signed)
Patient called to request to have her lab work completed and then a virtual MD visit a few days later.   She is currently scheduled to come in this afternoon, please advise.

## 2021-08-03 ENCOUNTER — Inpatient Hospital Stay: Payer: Medicare PPO | Attending: Oncology

## 2021-08-03 ENCOUNTER — Other Ambulatory Visit: Payer: Self-pay

## 2021-08-03 ENCOUNTER — Telehealth: Payer: Self-pay | Admitting: Internal Medicine

## 2021-08-03 DIAGNOSIS — E1165 Type 2 diabetes mellitus with hyperglycemia: Secondary | ICD-10-CM | POA: Insufficient documentation

## 2021-08-03 DIAGNOSIS — C9210 Chronic myeloid leukemia, BCR/ABL-positive, not having achieved remission: Secondary | ICD-10-CM | POA: Diagnosis present

## 2021-08-03 DIAGNOSIS — R5383 Other fatigue: Secondary | ICD-10-CM | POA: Insufficient documentation

## 2021-08-03 DIAGNOSIS — E611 Iron deficiency: Secondary | ICD-10-CM | POA: Diagnosis not present

## 2021-08-03 LAB — CBC WITH DIFFERENTIAL/PLATELET
Abs Immature Granulocytes: 0.02 10*3/uL (ref 0.00–0.07)
Basophils Absolute: 0 10*3/uL (ref 0.0–0.1)
Basophils Relative: 0 %
Eosinophils Absolute: 0.2 10*3/uL (ref 0.0–0.5)
Eosinophils Relative: 3 %
HCT: 39.9 % (ref 36.0–46.0)
Hemoglobin: 13 g/dL (ref 12.0–15.0)
Immature Granulocytes: 0 %
Lymphocytes Relative: 20 %
Lymphs Abs: 1.3 10*3/uL (ref 0.7–4.0)
MCH: 26.7 pg (ref 26.0–34.0)
MCHC: 32.6 g/dL (ref 30.0–36.0)
MCV: 82.1 fL (ref 80.0–100.0)
Monocytes Absolute: 0.5 10*3/uL (ref 0.1–1.0)
Monocytes Relative: 8 %
Neutro Abs: 4.5 10*3/uL (ref 1.7–7.7)
Neutrophils Relative %: 69 %
Platelets: 264 10*3/uL (ref 150–400)
RBC: 4.86 MIL/uL (ref 3.87–5.11)
RDW: 15.1 % (ref 11.5–15.5)
WBC: 6.5 10*3/uL (ref 4.0–10.5)
nRBC: 0 % (ref 0.0–0.2)

## 2021-08-03 LAB — COMPREHENSIVE METABOLIC PANEL
ALT: 42 U/L (ref 0–44)
AST: 39 U/L (ref 15–41)
Albumin: 4 g/dL (ref 3.5–5.0)
Alkaline Phosphatase: 95 U/L (ref 38–126)
Anion gap: 10 (ref 5–15)
BUN: 15 mg/dL (ref 8–23)
CO2: 30 mmol/L (ref 22–32)
Calcium: 9.3 mg/dL (ref 8.9–10.3)
Chloride: 95 mmol/L — ABNORMAL LOW (ref 98–111)
Creatinine, Ser: 1.22 mg/dL — ABNORMAL HIGH (ref 0.44–1.00)
GFR, Estimated: 49 mL/min — ABNORMAL LOW (ref >60)
Glucose, Bld: 152 mg/dL — ABNORMAL HIGH (ref 70–99)
Potassium: 5.2 mmol/L — ABNORMAL HIGH (ref 3.5–5.1)
Sodium: 135 mmol/L (ref 135–145)
Total Bilirubin: 0.5 mg/dL (ref 0.3–1.2)
Total Protein: 7.7 g/dL (ref 6.5–8.1)

## 2021-08-03 NOTE — Telephone Encounter (Signed)
Pt came in for labs today and she is scheduled for Virtual visit next week. Please switch to in-person per Dr. Tasia Catchings request, and inform pt of update.

## 2021-08-03 NOTE — Telephone Encounter (Signed)
Tara Levine calling from [facility] regarding underpads any size, fax initially submitted on 07/17/2021. Called to report that he is refaxing request   Best contact: [phone]

## 2021-08-03 NOTE — Telephone Encounter (Signed)
Pt is recently a new pt and this has never been discussed nor do we have any diagnosis for this.  If pt is needing she needs to schedule an appointment

## 2021-08-05 ENCOUNTER — Encounter: Payer: Self-pay | Admitting: Oncology

## 2021-08-05 DIAGNOSIS — Z5111 Encounter for antineoplastic chemotherapy: Secondary | ICD-10-CM | POA: Insufficient documentation

## 2021-08-06 ENCOUNTER — Telehealth: Payer: Self-pay | Admitting: Internal Medicine

## 2021-08-06 NOTE — Telephone Encounter (Signed)
Copied from [place] [id number]. Topic: Medicare AWV >> Aug 06, 2021  1:10 PM Jae Dire wrote: Reason for CRM:   Left message for patient to call back and schedule Medicare Annual Wellness Visit (AWV) in office.   If unable to come into the office for AWV,  please offer to do virtually or by telephone.  No hx of AWV eligible for AWVI per [place] as of 02/25/2021   Please schedule at anytime with [place].      45 minute appointment   Any questions, please call me at [phone]

## 2021-08-06 NOTE — Telephone Encounter (Signed)
Pt will wait til 6/30 appt

## 2021-08-07 ENCOUNTER — Inpatient Hospital Stay: Payer: Medicare PPO | Admitting: Oncology

## 2021-08-07 ENCOUNTER — Telehealth: Payer: Self-pay | Admitting: *Deleted

## 2021-08-07 ENCOUNTER — Telehealth: Payer: Self-pay | Admitting: Oncology

## 2021-08-07 LAB — BCR-ABL1 FISH
Cells Analyzed: 200
Cells Counted: 200

## 2021-08-07 NOTE — Telephone Encounter (Signed)
Our scheduler spoke with her earlier to offer transportation because this was told to [place] as well. Patient denied riding in transportation bus. Dr. Tasia Catchings made it clear to her that in order for her to get any more refills with her medication she has to be seen in office. She has come in for labs, so not sure why she is unable to be seen in office. Patient verbalized understanding at last Video Visit, Per Dr. Tasia Catchings, that she is to be seen in office.

## 2021-08-07 NOTE — Telephone Encounter (Signed)
Received a message from patient that she was told that she cannot get her medicine until she sees doctor in office and that she has been out of medicine for a while now. She states that she is crippled and has transportation issues and wants to know what she can do. I spoke with [place] who states that because patient has medicare, she will need to contact [employer] for a ride when she schedules appointment. I attempted to return call to patient with this information, but it went to voice mail which is full and I was unable to leave a message for her

## 2021-08-07 NOTE — Telephone Encounter (Signed)
Called pt to r/s appts from Virtual to in person, pt states that she cannot make in person visit due to transportation, I offered [place] transportation due to her Medicare covergae, she stated that she cannot ride [employer]. Would like refill of meds .Marland KitchenKJ

## 2021-08-08 ENCOUNTER — Telehealth: Payer: Self-pay

## 2021-08-08 NOTE — Telephone Encounter (Signed)
Patient called asking what she could do to get medication refilled (BOSULIF). Informed her that Dr. Tasia Catchings requested in person visit since her last visit was virtual and she needed to asses her in person. Pt voiced understanding but states that she was unable to come in person due to having and open would on her foot and it is hard for her to get around. Spoke to Dr. Tasia Catchings and she agreed to have a virtual visit with her but next visit will have to be in person. Pt verbalized understanding and accepted appt for Monday at 2:30p.

## 2021-08-10 ENCOUNTER — Ambulatory Visit: Payer: Self-pay | Admitting: *Deleted

## 2021-08-10 DIAGNOSIS — B379 Candidiasis, unspecified: Secondary | ICD-10-CM

## 2021-08-10 MED ORDER — NYSTATIN 100000 UNIT/GM EX CREA: 1.0000 | TOPICAL_CREAM | Freq: Two times a day (BID) | CUTANEOUS | 0 refills | Status: DC

## 2021-08-10 NOTE — Telephone Encounter (Signed)
  Chief Complaint: yeast infection under both breasts and creases of legs and under stomach folds Symptoms: Itching badly and spreading.  Fluconazole isn't helping yet.  Requesting more be called in. Frequency: "For a while"  [name] is aware of it Pertinent Negatives: Patient denies N/A Disposition: '[]'$ ED /'[]'$ Urgent Care (no appt availability in office) / '[]'$ Appointment(In office/virtual)/ '[]'$  [place] [facility]/ '[]'$ Home Care/ '[]'$ Refused Recommended Disposition /'[]'$  Mobile Bus/ '[x]'$  Follow-up with PCP Additional Notes: Message sent to [name].   Pt agreeable to someone calling her back with an answer

## 2021-08-10 NOTE — Telephone Encounter (Signed)
Reason for Disposition  Localized rash present > 7 days    Fluconazole for yeast under breasts not helping.   Need more called in  Answer Assessment - Initial Assessment Questions 1. APPEARANCE of RASH: "Describe the rash."      I have a yeast infection under my breasts for a few weeks now that is getting worse.  [name] is aware of it.   [name] gave me Fluconazole and it's not working.  I need some more of it.    It's itches so badly.   I'm using a powder that is not helping that she gave me too. 2. LOCATION: "Where is the rash located?"      Under my breasts and in moisture areas like the creases of my legs.   I've had this before.  Also under my stomach folds. 3. NUMBER: "How many spots are there?"      N/A 4. SIZE: "How big are the spots?" (Inches, centimeters or compare to size of a coin)      She wants some more Fluconazole called in.    5. ONSET: "When did the rash start?"      "For a while"    I don't remember how long now but a while 6. ITCHING: "Does the rash itch?" If Yes, ask: "How bad is the itch?"  (Scale 0-10; or none, mild, moderate, severe)     Yes badly 7. PAIN: "Does the rash hurt?" If Yes, ask: "How bad is the pain?"  (Scale 0-10; or none, mild, moderate, severe)    - NONE (0): no pain    - MILD (1-3): doesn't interfere with normal activities     - MODERATE (4-7): interferes with normal activities or awakens from sleep     - SEVERE (8-10): excruciating pain, unable to do any normal activities     The antibiotics did this to me from antibiotics for my toe. 8. OTHER SYMPTOMS: "Do you have any other symptoms?" (e.g., fever)     No 9. PREGNANCY: "Is there any chance you are pregnant?" "When was your last menstrual period?"     N/A  Protocols used: Rash or Redness - Localized-A-AH

## 2021-08-10 NOTE — Addendum Note (Signed)
Addended by: Teodora Medici on: 08/10/2021 01:06 PM   Modules accepted: Orders

## 2021-08-13 ENCOUNTER — Other Ambulatory Visit (HOSPITAL_COMMUNITY): Payer: Self-pay

## 2021-08-13 ENCOUNTER — Encounter: Payer: Self-pay | Admitting: Oncology

## 2021-08-13 ENCOUNTER — Inpatient Hospital Stay (HOSPITAL_BASED_OUTPATIENT_CLINIC_OR_DEPARTMENT_OTHER): Payer: Medicare PPO | Admitting: Oncology

## 2021-08-13 DIAGNOSIS — C9210 Chronic myeloid leukemia, BCR/ABL-positive, not having achieved remission: Secondary | ICD-10-CM

## 2021-08-13 DIAGNOSIS — Z5111 Encounter for antineoplastic chemotherapy: Secondary | ICD-10-CM

## 2021-08-13 MED ORDER — BOSUTINIB 400 MG PO TABS
400.0000 mg | ORAL_TABLET | Freq: Every day | ORAL | 2 refills | Status: DC
  Filled 2021-08-13: qty 30, 30d supply, fill #0

## 2021-08-14 ENCOUNTER — Other Ambulatory Visit: Payer: Self-pay | Admitting: Pharmacist

## 2021-08-14 ENCOUNTER — Ambulatory Visit: Payer: Self-pay | Admitting: *Deleted

## 2021-08-14 DIAGNOSIS — C9210 Chronic myeloid leukemia, BCR/ABL-positive, not having achieved remission: Secondary | ICD-10-CM

## 2021-08-14 LAB — BCR-ABL1, CML/ALL, PCR, QUANT: b3a2 transcript: 0.0521 %

## 2021-08-14 MED ORDER — BOSUTINIB 400 MG PO TABS: 400.0000 mg | ORAL_TABLET | Freq: Every day | ORAL | 2 refills | Status: DC

## 2021-08-14 NOTE — Telephone Encounter (Signed)
  Chief Complaint: yeast from recent antibiotic use Symptoms: itching, red irritation in all skin folds- under breast, arms,groin,legs Frequency: patient was treated for 4 days with Diflucan- she is requesting more because the yeats has not gone away. Patient states she is miserable. There was a triage note regarding this 08/10/21. Pertinent Negatives: Patient denies   Disposition: '[]'$ ED /'[]'$ Urgent Care (no appt availability in office) / '[]'$ Appointment(In office/virtual)/ '[]'$  [facility]/ '[]'$ Home Care/ '[x]'$ Refused Recommended Disposition /'[]'$ [place] [facility]/ '[]'$  Follow-up with PCP Additional Notes: Offered to make virtual visit appointment for patient- she declined- she states she has been treated for this before- explained another provider may need to see her- she states she can't come to the office due to her foot wound- she is to stay off of it. Advised will send note- but not scheduling virtual visit may delay her treatment.

## 2021-08-14 NOTE — Telephone Encounter (Signed)
Reason for Disposition  SEVERE itching (i.e., interferes with sleep, normal activities or school)  Answer Assessment - Initial Assessment Questions 1. APPEARANCE of RASH: "Describe the rash." (e.g., spots, blisters, raised areas, skin peeling, scaly)     Red irritated 2. SIZE: "How big are the spots?" (e.g., tip of pen, eraser, coin; inches, centimeters)     Large areas under arms, breast, hair line, groin, leg creases 3. LOCATION: "Where is the rash located?"     See above 4. COLOR: "What color is the rash?" (Note: It is difficult to assess rash color in people with darker-colored skin. When this situation occurs, simply ask the caller to describe what they see.)     Red 5. ONSET: "When did the rash begin?"     Patient was given 4 pills of fluconazole  6. FEVER: "Do you have a fever?" If Yes, ask: "What is your temperature, how was it measured, and when did it start?"     no 7. ITCHING: "Does the rash itch?" If Yes, ask: "How bad is the itch?" (Scale 1-10; or mild, moderate, severe)     severe 8. CAUSE: "What do you think is causing the rash?"     yeast 9. MEDICINE FACTORS: "Have you started any new medicines within the last 2 weeks?" (e.g., antibiotics)      Recent Antibiotic for MRSA 10. OTHER SYMPTOMS: "Do you have any other symptoms?" (e.g., dizziness, headache, sore throat, joint pain)         11. PREGNANCY: "Is there any chance you are pregnant?" "When was your last menstrual period?"  Protocols used: Rash or Redness - [facility]

## 2021-08-14 NOTE — Telephone Encounter (Signed)
Oral Oncology Patient Advocate Encounter  Received notification from [place] that patient has been successfully enrolled into their program to receive Bosulif from the manufacturer at $0 out of pocket until 02/24/22.    I called and spoke with patient.  She knows we will have to re-apply.   Specialty Pharmacy that will dispense medication is [facility].  Patient knows to call the office with questions or concerns.   Oral Oncology Clinic will continue to follow.  [place] Patient [place] Phone [phone] Fax [phone]

## 2021-08-15 ENCOUNTER — Telehealth (INDEPENDENT_AMBULATORY_CARE_PROVIDER_SITE_OTHER): Payer: Medicare PPO | Admitting: Family Medicine

## 2021-08-15 ENCOUNTER — Encounter: Payer: Self-pay | Admitting: Family Medicine

## 2021-08-15 DIAGNOSIS — Z794 Long term (current) use of insulin: Secondary | ICD-10-CM

## 2021-08-15 DIAGNOSIS — E875 Hyperkalemia: Secondary | ICD-10-CM

## 2021-08-15 DIAGNOSIS — L304 Erythema intertrigo: Secondary | ICD-10-CM

## 2021-08-15 DIAGNOSIS — E119 Type 2 diabetes mellitus without complications: Secondary | ICD-10-CM | POA: Diagnosis not present

## 2021-08-15 MED ORDER — NYSTATIN 100000 UNIT/GM EX POWD: 1.0000 | Freq: Three times a day (TID) | CUTANEOUS | 2 refills | Status: AC

## 2021-08-15 MED ORDER — FLUCONAZOLE 150 MG PO TABS: 150.0000 mg | ORAL_TABLET | ORAL | 0 refills | Status: DC

## 2021-08-15 NOTE — Progress Notes (Signed)
Virtual Visit via Video Note  I connected with Kimoni Neisler on 08/15/21 at 11:00 AM EDT by a video enabled telemedicine application and verified that I am speaking with the correct person using two identifiers.  Location: Patient: home Provider: [facility]   I discussed the limitations of evaluation and management by telemedicine and the availability of in person appointments. The patient expressed understanding and agreed to proceed.  History of Present Illness:  RASH - been on antibiotics from podiatrist for foot infection weeks ago. - some vaginal discharge - CBGs 160s.  Duration:  weeks  Location: under b/l breasts, back of head at hair line, groin, behind knees, underarms.  Itching: yes Burning: yes Redness: yes Painful: yes Fevers: no Change in detergents/soaps/personal care products: no History of same: yes Context: worse Alleviating factors: none Treatments attempted: nystatin powder, tinactin CBGs 160s  Hyperkalemia - concerned about higher potassium level and elevated Creatinine at recent Oncology appt. Wants to know how to bring down.    Observations/Objective:  Well appearing, in NAD. In bed. Speaks in full sentences. Comfortable WOB on RA. No resp distress.  Rash difficult to see given video quality, erythematous rash under b/l breasts.   Assessment and Plan:  Problem List Items Addressed This Visit       Endocrine   Type 2 diabetes mellitus without complication ([place]) - Primary    Likely contributing to rash though reports better CBGs after start of ozempic and tresiba, last A1c 8.2. encouraged continued compliance.         Musculoskeletal and Integument   Intertrigo    Rash throughout multiple skin folds and refractory to topical therapy (powder, spray, cream), started after recent antibiotic use and known uncontrolled diabetes. Will provide oral diflucan once weekly x4 weeks. Refilled nystatin powder. Counseled on conservative measures, keeping skin clean  and dry. F/u if no better after treatment.       Relevant Medications   fluconazole (DIFLUCAN) 150 MG tablet   nystatin (MYCOSTATIN/NYSTOP) powder     Other   Hyperkalemia    Reviewed labs. K 5.2 6/9 with Cr 1.22 with prior normal. Encouraged compliance with medications, adequate oral hydration, and avoidance of excessive NSAID use. Will recheck on follow up, suspect will normalize given recent increase in [place] and better reported blood sugar control.          I discussed the assessment and treatment plan with the patient. The patient was provided an opportunity to ask questions and all were answered. The patient agreed with the plan and demonstrated an understanding of the instructions.   The patient was advised to call back or seek an in-person evaluation if the symptoms worsen or if the condition fails to improve as anticipated.  I provided 18 minutes of non-face-to-face time during this encounter.   Myles Gip, DO

## 2021-08-15 NOTE — Assessment & Plan Note (Signed)
Likely contributing to rash though reports better CBGs after start of ozempic and tresiba, last A1c 8.2. encouraged continued compliance.

## 2021-08-15 NOTE — Telephone Encounter (Signed)
Called patient she states she will do a virtual, also the cream Dr.Andrews sent in never got to her pharmacy can it be fixed?

## 2021-08-15 NOTE — Assessment & Plan Note (Addendum)
Rash throughout multiple skin folds and refractory to topical therapy (powder, spray, cream), started after recent antibiotic use and known uncontrolled diabetes. Will provide oral diflucan once weekly x4 weeks. Refilled nystatin powder. Counseled on conservative measures, keeping skin clean and dry. F/u if no better after treatment.

## 2021-08-15 NOTE — Assessment & Plan Note (Addendum)
Reviewed labs. K 5.2 6/9 with Cr 1.22 with prior normal. Encouraged compliance with medications, adequate oral hydration, and avoidance of excessive NSAID use. Will recheck on follow up, suspect will normalize given recent increase in [place] and better reported blood sugar control.

## 2021-08-15 NOTE — Patient Instructions (Signed)

## 2021-08-16 ENCOUNTER — Telehealth: Payer: Self-pay | Admitting: Pharmacy Technician

## 2021-08-17 ENCOUNTER — Other Ambulatory Visit: Payer: Self-pay | Admitting: Internal Medicine

## 2021-08-17 DIAGNOSIS — E1165 Type 2 diabetes mellitus with hyperglycemia: Secondary | ICD-10-CM

## 2021-08-24 ENCOUNTER — Ambulatory Visit: Payer: Medicare PPO | Admitting: Internal Medicine

## 2021-08-24 NOTE — Progress Notes (Deleted)
   Established Patient Office Visit  Subjective   Patient ID: Tara Levine, female    DOB: 30-Nov-1955  Age: 66 y.o. MRN: [id number]  No chief complaint on file.   HPI Tara Levine is a 66 year old female here for follow up.  Diabetes, Type 2: -First diagnosed in 2011 -Last A1c 3/23 8.4% -Medications: Lantus 20 (down from 25 - patient changed herself) units at night, Novolog 10 units TID with meals. Ozempic increased to 1 mg weekly. Cannot tolerate Metformin due to GI side effects.  -Patient reports no side effects from medications above. -Checking BG at home: fasting sugar 216 this morning; post-prandial 290-310  -Diet: eats oatmeal, cornbread and black eyes peas. Decreased dietary sugar and increased fruit intake  -Eye exam: Due -Foot exam: Seeing Podiatry -Microalbumin: UTD 3/23 -Statin: no -PNA vaccine: 13 in 2015, declines any further pneumonia vaccines  -Has symptoms of diabetic neuropathy on Lyrica, also follows with podiatry for ulcer on right foot  Continues to follow with podiatry for wound of the great right toe.  She has finished multiple rounds of antibiotics.   Following with Dr. Jens Som, visits now every 4 weeks which is an improvement.   Hypertension/CHF: -Medications: Losartan 100, Amlodipine 5, Lasix 40 mg  -Patient is compliant with above medications and reports no side effects. -Checking BP at home (average): 136/72 -Denies any SOB, CP, vision changes, LE edema or symptoms of hypotension -last echo /22 EF 55-60%   HLD: -Medications: Lipitor dose uncertain  -Patient is compliant with above medications and reports no side effects.  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 {Vitals History (Optional):23777}  Physical Exam   No results found for any visits on 08/24/21.  {Labs (Optional):23779}  The 10-year ASCVD risk score (Arnett DK, et al., 2019) is: 13.5%    Assessment & Plan:   Problem List Items Addressed This Visit   None   No follow-ups on file.    Teodora Medici, DO

## 2021-08-27 ENCOUNTER — Other Ambulatory Visit: Payer: Self-pay | Admitting: Internal Medicine

## 2021-08-27 DIAGNOSIS — E1165 Type 2 diabetes mellitus with hyperglycemia: Secondary | ICD-10-CM

## 2021-08-29 NOTE — Telephone Encounter (Signed)
Requested by interface surescripts. Dose discontinued 07/17/21 Requested Prescriptions  Refused Prescriptions Disp Refills  . OZEMPIC, 0.25 OR 0.5 MG/DOSE, 2 MG/3ML SOPN [Pharmacy Med Name: OZEMPIC 0.25-0.5 MG/DOSE PEN]      Sig: INJECT 0.25 MG INTO THE SKIN ONCE A WEEK FOR 30 DAYS, THEN 0.5 MG ONCE A WEEK.     There is no refill protocol information for this order

## 2021-08-31 ENCOUNTER — Ambulatory Visit (INDEPENDENT_AMBULATORY_CARE_PROVIDER_SITE_OTHER): Payer: Medicare PPO | Admitting: Internal Medicine

## 2021-08-31 ENCOUNTER — Encounter: Payer: Self-pay | Admitting: Internal Medicine

## 2021-08-31 VITALS — BP 132/80 | HR 73 | Temp 97.8°F | Resp 16 | Ht 67.0 in | Wt 277.5 lb

## 2021-08-31 DIAGNOSIS — B3731 Acute candidiasis of vulva and vagina: Secondary | ICD-10-CM

## 2021-08-31 DIAGNOSIS — E7800 Pure hypercholesterolemia, unspecified: Secondary | ICD-10-CM

## 2021-08-31 DIAGNOSIS — R32 Unspecified urinary incontinence: Secondary | ICD-10-CM | POA: Diagnosis not present

## 2021-08-31 DIAGNOSIS — Z794 Long term (current) use of insulin: Secondary | ICD-10-CM

## 2021-08-31 DIAGNOSIS — I10 Essential (primary) hypertension: Secondary | ICD-10-CM | POA: Diagnosis not present

## 2021-08-31 DIAGNOSIS — E119 Type 2 diabetes mellitus without complications: Secondary | ICD-10-CM | POA: Diagnosis not present

## 2021-08-31 LAB — POCT GLYCOSYLATED HEMOGLOBIN (HGB A1C): Hemoglobin A1C: 8.1 % — AB (ref 4.0–5.6)

## 2021-08-31 MED ORDER — DEXCOM G7 RECEIVER DEVI: 1.0000 | 3 refills | Status: AC

## 2021-08-31 MED ORDER — FLUCONAZOLE 150 MG PO TABS: 150.0000 mg | ORAL_TABLET | ORAL | 0 refills | Status: DC

## 2021-08-31 MED ORDER — DEXCOM G7 SENSOR MISC: 1.0000 | 1 refills | Status: AC

## 2021-08-31 MED ORDER — EMPAGLIFLOZIN 25 MG PO TABS: 25.0000 mg | ORAL_TABLET | Freq: Every day | ORAL | 3 refills | Status: DC

## 2021-08-31 NOTE — Progress Notes (Signed)
Established Patient Office Visit  Subjective:  Patient ID: Tara Levine, female    DOB: May 02, 1955  Age: 66 y.o. MRN: [id number]  CC:  Chief Complaint  Patient presents with   Follow-up   Diabetes   Urinary Incontinence    Need for underpads and pull ups    HPI [name] presents to follow-up on diabetes.  Diabetes, Type 2: -First diagnosed in 2011 -Last A1c 3/23 8.4%, 8.1% today. -Medications: Tresiba 25 units at night, Novolog 20 units at meals. Ozempic 1 mg weekly.  Patient very much wants to be on Jardiance. -Patient is compliant with the above medications and reports no side effects. -Checking BG at home: 200-220 in the morning. Not checking post-prandials  -Diet: eats oatmeal, cornbread and black eyes peas  -Eye exam: Due, referral placed to ophthalmology previously -Foot exam: Seeing Podiatry for wounds on toe, appears to be healing.  Has been on multiple antibiotics and is continuing to complain of vaginal yeast infections. -Microalbumin: Up-to-date 3/23 -Statin: Yes -PNA vaccine: Prevnar 13 in 2015 -Has symptoms of diabetic neuropathy on Lyrica, also follows with podiatry for ulcer on right foot  Hypertension/CHF: -Medications: Losartan 100, Amlodipine 5, Lasix 40 mg  -Patient is compliant with above medications and reports no side effects. -Checking BP at home (average): 136/72 -Denies any SOB, CP, vision changes, LE edema or symptoms of hypotension -last echo /22 EF 55-60%  HLD: -Medications: Lipitor 20 mg -Patient is compliant with above medications and reports no side effects.  -Last lipid panel: Lipid Panel     Component Value Date/Time   CHOL 130 05/22/2021 1446   TRIG 166 (H) 05/22/2021 1446   TRIG 418 (H) 06/01/2012 1224   HDL 42 (L) 05/22/2021 1446   CHOLHDL 3.1 05/22/2021 1446   LDLCALC 64 05/22/2021 1446   CML: -Following with Oncology, taking Bosulif 400 mg daily   MDD: -Following with Psychiatry  -Current treatment: Cymbalta 20 TID  and 30 at night, xanax TID     08/31/2021    3:10 PM 08/15/2021   11:01 AM 08/01/2021   11:29 AM 07/17/2021   11:37 AM 06/22/2021    9:51 AM  Depression screen PHQ 2/9  Decreased Interest 3 0 0 0 0  Down, Depressed, Hopeless 3 0 0 0 0  PHQ - 2 Score 6 0 0 0 0  Altered sleeping 3 0 0 0 0  Tired, decreased energy 3 0 0 0 0  Change in appetite 3 0 0 0 0  Feeling bad or failure about yourself  3 0 0 0 0  Trouble concentrating 1 0 0 0 0  Moving slowly or fidgety/restless 1 0 0 0 0  Suicidal thoughts 0 0 0 0 0  PHQ-9 Score 20 0 0 0 0  Difficult doing work/chores Very difficult Not difficult at all Not difficult at all Not difficult at all Not difficult at all   Chronic Pain: -Pain in back, shoulders and neck -Follows with pain management  -On opioids, having opioid induced constipation   Past Medical History:  Diagnosis Date   CML (chronic myelocytic leukemia) ([place])    Depression    DM type 2 (diabetes mellitus, type 2) (HCC)    Environmental allergies    History of gastric ulcer    HLD (hyperlipidemia)    IDA (iron deficiency anemia)    chronic   Leukemia (HCC)    Leukemia, chronic myeloid (HCC)    Osteoarthritis    Panic disorder  PSS (progressive systemic sclerosis) ([place])    Unspecified essential hypertension     Past Surgical History:  Procedure Laterality Date   back and neck surgery  1999, 2009   had rods placed   [place]) No blackages found.    CARPAL TUNNEL RELEASE     PARTIAL HYSTERECTOMY     age 75, no cancer    Family History  Problem Relation Age of Onset   Diabetes Mother    Hypertension Mother    Heart disease Mother    Prostate cancer Father    Heart disease Father    Diabetes Brother     Social History   Socioeconomic History   Marital status: Married    Spouse name: Not on file   Number of children: Not on file   Years of education: Not on file   Highest education level: Not on file   Occupational History   Not on file  Tobacco Use   Smoking status: Never   Smokeless tobacco: Never   Tobacco comments:    quit 1985  Vaping Use   Vaping Use: Never used  Substance and Sexual Activity   Alcohol use: No    Alcohol/week: 0.0 standard drinks of alcohol   Drug use: No   Sexual activity: Not Currently  Other Topics Concern   Not on file  Social History Narrative   Not on file   Social Determinants of Health   Financial Resource Strain: Not on file  Food Insecurity: Not on file  Transportation Needs: Not on file  Physical Activity: Not on file  Stress: Not on file  Social Connections: Not on file  Intimate Partner Violence: Not on file    ROS Review of Systems  All other systems reviewed and are negative.   Objective:   Today's Vitals: BP 132/80   Pulse 73   Temp 97.8 F (36.6 C)   Resp 16   Ht '5\' 7"'$  (1.702 m)   Wt 277 lb 8 oz (125.9 kg)   SpO2 93%   BMI 43.46 kg/m   Physical Exam Constitutional:      Appearance: Normal appearance. She is obese.     Comments: Presents in wheelchair  HENT:     Head: Normocephalic and atraumatic.     Mouth/Throat:     Mouth: Mucous membranes are moist.     Pharynx: Oropharynx is clear.     Comments: PND present Eyes:     Extraocular Movements: Extraocular movements intact.     Conjunctiva/sclera: Conjunctivae normal.     Pupils: Pupils are equal, round, and reactive to light.  Cardiovascular:     Rate and Rhythm: Normal rate and regular rhythm.  Pulmonary:     Effort: Pulmonary effort is normal.     Breath sounds: Normal breath sounds.  Musculoskeletal:     Right lower leg: No edema.     Left lower leg: No edema.  Skin:    General: Skin is warm and dry.  Neurological:     General: No focal deficit present.     Mental Status: She is alert. Mental status is at baseline.  Psychiatric:        Mood and Affect: Mood normal.        Behavior: Behavior normal.     Assessment & Plan:   1. Type 2  diabetes mellitus without complication, with long-term current use of insulin ([place]): A1c in the office today slightly  improved to 8.1%.  Continue Tresiba 25 units at night, NovoLog 20 units 3 times daily at meals.  Continue Ozempic 1 mg weekly.  Patient insistent to be on Jardiance.  Discussed potential side effects including increased risk of urinary tract infections and urinary frequency.  Start Jardiance 25 mg daily.  Follow-up in 3 months for recheck of A1c.  Patient also would like to switch from Dexcom 6 to [facility] 7, transmitter and sensors sent to pharmacy.  - POCT HgB A1C - Continuous Blood Gluc Sensor (DEXCOM G7 SENSOR) MISC; 1 each by Does not apply route every 14 (fourteen) days.  Dispense: 12 each; Refill: 1 - empagliflozin (JARDIANCE) 25 MG TABS tablet; Take 1 tablet (25 mg total) by mouth daily before breakfast.  Dispense: 30 tablet; Refill: 3 - Continuous Blood Gluc Receiver ([place]) DEVI; 1 each by Does not apply route every 14 (fourteen) days.  Dispense: 1 each; Refill: 3  2. Primary hypertension: Chronic and stable.  Continue losartan 100 mg, amlodipine 5 mg and Lasix 40 mg daily.  3. Hypercholesterolemia: Chronic and stable.  Continue Lipitor 20 mg daily.  4. Urinary incontinence, unspecified type: Forms filled out to qualify for adult incontinence underwear.  5. Vaginal yeast infection: Secondary to multiple antibiotics.  Diflucan sent to pharmacy.  - fluconazole (DIFLUCAN) 150 MG tablet; Take 1 tablet (150 mg total) by mouth once a week. For four weeks.  Dispense: 4 tablet; Refill: 0  Follow-up: Return in about 3 months (around 12/01/2021).   Teodora Medici, DO

## 2021-08-31 NOTE — Patient Instructions (Signed)
It was great seeing you today!  Plan discussed at today's visit: -A1c better at 8.1% -Continue all medications the same, added Jardiance 25 mg daily -Continue checking sugars, bring log to next visit -Yeast medication sent to pharmacy -Dexcom 7 sent to pharmacy as well   Follow up in: 3 months  Take care and let us know if you have any questions or concerns prior to your next visit.  [name]

## 2021-09-07 ENCOUNTER — Other Ambulatory Visit: Payer: Self-pay | Admitting: Internal Medicine

## 2021-09-07 DIAGNOSIS — M25561 Pain in right knee: Secondary | ICD-10-CM | POA: Diagnosis not present

## 2021-09-07 DIAGNOSIS — E1165 Type 2 diabetes mellitus with hyperglycemia: Secondary | ICD-10-CM

## 2021-09-07 DIAGNOSIS — Z961 Presence of intraocular lens: Secondary | ICD-10-CM | POA: Diagnosis not present

## 2021-09-07 DIAGNOSIS — M7918 Myalgia, other site: Secondary | ICD-10-CM | POA: Diagnosis not present

## 2021-09-07 DIAGNOSIS — G8929 Other chronic pain: Secondary | ICD-10-CM | POA: Diagnosis not present

## 2021-09-07 DIAGNOSIS — M5412 Radiculopathy, cervical region: Secondary | ICD-10-CM | POA: Diagnosis not present

## 2021-09-07 DIAGNOSIS — M25562 Pain in left knee: Secondary | ICD-10-CM | POA: Diagnosis not present

## 2021-09-07 DIAGNOSIS — M545 Low back pain, unspecified: Secondary | ICD-10-CM | POA: Diagnosis not present

## 2021-09-07 DIAGNOSIS — H26491 Other secondary cataract, right eye: Secondary | ICD-10-CM | POA: Diagnosis not present

## 2021-09-07 NOTE — Telephone Encounter (Signed)
rx was sent to pharmacy on 08/01/21 #7.50m/2 RF (90 day supply) Requested Prescriptions  Pending Prescriptions Disp Refills  . insulin degludec (TRESIBA FLEXTOUCH) 100 UNIT/ML FlexTouch Pen 7.5 mL 2    Sig: Inject 25 Units into the skin daily.     Endocrinology:  Diabetes - Insulins Failed - 09/07/2021  3:46 PM      Failed - HBA1C is between 0 and 7.9 and within 180 days    Hemoglobin A1C  Date Value Ref Range Status  08/31/2021 8.1 (A) 4.0 - 5.6 % Final   Hgb A1c MFr Bld  Date Value Ref Range Status  05/22/2021 8.4 (H) <5.7 % of total Hgb Final    Comment:    For someone without known diabetes, a hemoglobin A1c value of 6.5% or greater indicates that they may have  diabetes and this should be confirmed with a follow-up  test. . For someone with known diabetes, a value <7% indicates  that their diabetes is well controlled and a value  greater than or equal to 7% indicates suboptimal  control. A1c targets should be individualized based on  duration of diabetes, age, comorbid conditions, and  other considerations. . Currently, no consensus exists regarding use of hemoglobin A1c for diagnosis of diabetes for children. .Renella Cunas- Valid encounter within last 6 months    Recent Outpatient Visits          1 week ago Type 2 diabetes mellitus without complication, with long-term current use of insulin ([facility]   [facility]   3 weeks ago Type 2 diabetes mellitus without complication, with long-term current use of insulin ([facility]   [facility]   1 month ago Uncontrolled type 2 diabetes mellitus with hyperglycemia, with long-term current use of insulin ([facility]   [facility]   1 month ago Uncontrolled type 2 diabetes mellitus with hyperglycemia, with long-term current use of insulin ([facility]   [place]   2 months  ago Yeast infection   [facility]      Future Appointments            In 1 month YEarlie Server MD [place]  In 3 months ATeodora Medici [facility]

## 2021-09-07 NOTE — Telephone Encounter (Signed)
Patient states pharmacy informed her she will be due to refill OZEMPIC, 1 MG/DOSE, 4 MG/3ML SOPN. Patient would like PCP to send in a 6 month supply.    [id number] [place] [facility] [address] Phone:  [phone] Fax:  [phone]

## 2021-09-07 NOTE — Telephone Encounter (Signed)
Requested medication (s) are due for refill today: yes  Requested medication (s) are on the active medication list: yes  Last refill:  08/17/21 #72m 0 refills  Future visit scheduled: yes in 3 months  Notes to clinic: no refills left. Do you want to give refills for 3 months? Until OV     Requested Prescriptions  Pending Prescriptions Disp Refills   OZEMPIC, 1 MG/DOSE, 4 MG/3ML SOPN [Pharmacy Med Name: OZEMPIC 4 MG/3 ML (1 MG/DOSE)]      Sig: INJECT 1 MG ONCE A WEEK AS DIRECTED     Endocrinology:  Diabetes - GLP-1 Receptor Agonists - semaglutide Failed - 09/07/2021 11:25 AM      Failed - HBA1C in normal range and within 180 days    Hemoglobin A1C  Date Value Ref Range Status  08/31/2021 8.1 (A) 4.0 - 5.6 % Final   Hgb A1c MFr Bld  Date Value Ref Range Status  05/22/2021 8.4 (H) <5.7 % of total Hgb Final    Comment:    For someone without known diabetes, a hemoglobin A1c value of 6.5% or greater indicates that they may have  diabetes and this should be confirmed with a follow-up  test. . For someone with known diabetes, a value <7% indicates  that their diabetes is well controlled and a value  greater than or equal to 7% indicates suboptimal  control. A1c targets should be individualized based on  duration of diabetes, age, comorbid conditions, and  other considerations. . Currently, no consensus exists regarding use of hemoglobin A1c for diagnosis of diabetes for children. .          Failed - Cr in normal range and within 360 days    Creat  Date Value Ref Range Status  05/22/2021 0.99 0.50 - 1.05 mg/dL Final   Creatinine, Ser  Date Value Ref Range Status  08/03/2021 1.22 (H) 0.44 - 1.00 mg/dL Final   Creatinine, Urine  Date Value Ref Range Status  05/22/2021 148 20 - 275 mg/dL Final         Passed - Valid encounter within last 6 months    Recent Outpatient Visits           1 week ago Type 2 diabetes mellitus without complication, with long-term current  use of insulin ([facility]   [facility]   3 weeks ago Type 2 diabetes mellitus without complication, with long-term current use of insulin ([facility]   [facility]   1 month ago Uncontrolled type 2 diabetes mellitus with hyperglycemia, with long-term current use of insulin ([facility]   [facility]   1 month ago Uncontrolled type 2 diabetes mellitus with hyperglycemia, with long-term current use of insulin ([facility]   CUpper [place] DO   2 months ago Yeast infection   [facility]       Future Appointments             In 1 month YEarlie Server MD [place]  In 3 months ATeodora Medici [facility]

## 2021-09-07 NOTE — Telephone Encounter (Signed)
Medication Refill - Medication:   insulin degludec (TRESIBA FLEXTOUCH) 100 UNIT/ML FlexTouch Pen (patient states blood sugar has been under controlled since PCP has switched medication) Patient has  a few pens left.   Has the patient contacted their pharmacy? Yes.    (Agent: If yes, when and what did the pharmacy advise?) Contact PCP office   Preferred Pharmacy (with phone number or street name):   [id number] [address] [address] Phone:  [phone] Fax:  [phone]     Has the patient been seen for an appointment in the last year OR does the patient have an upcoming appointment? Yes.    Agent: Please be advised that RX refills may take up to 3 business days. We ask that you follow-up with your pharmacy.

## 2021-09-10 ENCOUNTER — Ambulatory Visit: Payer: Self-pay | Admitting: *Deleted

## 2021-09-10 NOTE — Telephone Encounter (Signed)
Message from Shan Levans sent at 09/10/2021  9:14 AM EDT  Summary: Kidney Stone   Pt said she passed a kidney stone Saturday, July 15th. She said there was blood in her urine. Pt said she was experiencing symptoms of a kidney stone before [name] changed her medication at last visit on 7/7.           Call History   Type Contact Phone/Fax User  09/10/2021 09:04 AM EDT Phone (Incoming) Domingo Cocking, Paulene (Self) [phone] (M) Mabe,

## 2021-09-10 NOTE — Telephone Encounter (Signed)
I forgot to tell [name] when I was in there last that I've been having some blood in my urine.  I've had problems with my bladder for many years.    This is the first time I've seen this much blood.   I just wanted it to be on my record that I had blood in my urine when I was in to see her last but I forgot to mention it to her.   This past Sat. I think I passed a kidney stone but I'm not sure.   I haven't bled anymore since Sat.     I had to bear down to have a BM.  It wasn't coming from my bottom.   The bleeding was in my urine.      No bleeding now.   Sat. The bleeding stopped.   I just wanted for it to be on the record that I was having blood in my urine but it has stopped now.   I'm fine.     I would like for [name] to recommend a urologist for me to see to determine if I had a kidney stone or not.     I let her know I would send her request to [name] and someone would be contacting her.    Pt was agreeable to this.   Did not want an appt.    (Protocol questions not used due to a system issue.  IT is working on it now reason this is free texted without the usual triage questions.)

## 2021-09-10 NOTE — Telephone Encounter (Signed)
Pt called wanted you to know she will call [place] [facility] to try to get an appt herself instead of coming here

## 2021-09-14 DIAGNOSIS — E1140 Type 2 diabetes mellitus with diabetic neuropathy, unspecified: Secondary | ICD-10-CM | POA: Diagnosis not present

## 2021-09-14 DIAGNOSIS — Z794 Long term (current) use of insulin: Secondary | ICD-10-CM | POA: Diagnosis not present

## 2021-09-14 DIAGNOSIS — L97511 Non-pressure chronic ulcer of other part of right foot limited to breakdown of skin: Secondary | ICD-10-CM | POA: Diagnosis not present

## 2021-09-14 DIAGNOSIS — L97512 Non-pressure chronic ulcer of other part of right foot with fat layer exposed: Secondary | ICD-10-CM | POA: Diagnosis not present

## 2021-09-25 DIAGNOSIS — R32 Unspecified urinary incontinence: Secondary | ICD-10-CM | POA: Diagnosis not present

## 2021-09-27 ENCOUNTER — Other Ambulatory Visit: Payer: Self-pay | Admitting: Pharmacist

## 2021-09-27 ENCOUNTER — Telehealth (INDEPENDENT_AMBULATORY_CARE_PROVIDER_SITE_OTHER): Payer: Medicare HMO | Admitting: Psychiatry

## 2021-09-27 DIAGNOSIS — C9210 Chronic myeloid leukemia, BCR/ABL-positive, not having achieved remission: Secondary | ICD-10-CM

## 2021-09-27 DIAGNOSIS — F3130 Bipolar disorder, current episode depressed, mild or moderate severity, unspecified: Secondary | ICD-10-CM

## 2021-09-27 DIAGNOSIS — F4310 Post-traumatic stress disorder, unspecified: Secondary | ICD-10-CM | POA: Diagnosis not present

## 2021-09-27 MED ORDER — ALPRAZOLAM 1 MG PO TABS
1.0000 mg | ORAL_TABLET | Freq: Three times a day (TID) | ORAL | 5 refills | Status: DC
Start: 2021-09-27 — End: 2021-12-18

## 2021-09-27 MED ORDER — DULOXETINE HCL 20 MG PO CPEP: 20.0000 mg | ORAL_CAPSULE | Freq: Four times a day (QID) | ORAL | 5 refills | Status: DC

## 2021-09-27 MED ORDER — BOSUTINIB 400 MG PO TABS: 400.0000 mg | ORAL_TABLET | Freq: Every day | ORAL | 0 refills | Status: DC

## 2021-09-27 MED ORDER — PREGABALIN 300 MG PO CAPS: 300.0000 mg | ORAL_CAPSULE | Freq: Two times a day (BID) | ORAL | 5 refills | Status: DC

## 2021-09-27 NOTE — Progress Notes (Signed)
Virtual Visit via Telephone Note  I connected with Tara Levine on 09/27/21 at  1:20 PM EDT by telephone and verified that I am speaking with the correct person using two identifiers.  Location: Patient: Home Provider: Hospital   I discussed the limitations, risks, security and privacy concerns of performing an evaluation and management service by telephone and the availability of in person appointments. I also discussed with the patient that there may be a patient responsible charge related to this service. The patient expressed understanding and agreed to proceed.   History of Present Illness: Patient reached by telephone.  Identities established.  Patient continues to have multiple medical problems including diabetes still not very well controlled and open on healing wound on her foot multiple other medical issues.  Chronic pain.  Mood stable.  Dysphoric but not suicidal.  Energy level chronically low.  No evidence of psychosis.  Tolerating medicines well.  Anxiety under adequate control.  No report or sign of abuse    Observations/Objective: Alert and oriented.  Affect mildly dysphoric but reactive.  Denies suicidal thoughts.  Denies psychotic symptoms.   Assessment and Plan: Support and encouragement.  Refilled current prescriptions.  She did not take the extra Cymbalta at night so she is still just on 60 mg a day.  She specifically asked me if I could write the Cymbalta for brand name only.  I told her I did not know whether or not they would fill it that way but I could always give it a try.  Prescription changed.  Follow-up 6 months   Follow Up Instructions:    I discussed the assessment and treatment plan with the patient. The patient was provided an opportunity to ask questions and all were answered. The patient agreed with the plan and demonstrated an understanding of the instructions.   The patient was advised to call back or seek an in-person evaluation if the symptoms worsen  or if the condition fails to improve as anticipated.  I provided 10 minutes of non-face-to-face time during this encounter.   Alethia Berthold, MD

## 2021-10-01 ENCOUNTER — Ambulatory Visit: Payer: Self-pay

## 2021-10-01 NOTE — Telephone Encounter (Signed)
Appt sch'd with Dr Rosana Berger for tomorrow morning. Pt refused appt with anyone else

## 2021-10-01 NOTE — Telephone Encounter (Signed)
FYI

## 2021-10-01 NOTE — Telephone Encounter (Addendum)
Pt stopped Jardiance first then [place] and stated she feels better now. Pt Chief Complaint: pt thinks drug interaction between diabetic meds and psych. meds Symptoms: pt felt more anxious and felt a whole body sensation "buzzing", felt like she was not thinking as clearly" Frequency: last Week Pertinent Negatives: Patient denies other sx Disposition: '[]'$ ED /'[x]'$ Urgent Care (no appt availability in office) / '[x]'$ Appointment(In office/virtual)/ '[]'$  [place] [facility]/ '[]'$ Home Care/ '[]'$ Refused Recommended Disposition /'[]'$ [place] [facility]/ '[]'$  Follow-up with PCP Additional Notes: pt needs VV with provider. Scheduled pt for Thursday but too far out. Please call pt for a sooner appt Reason for Disposition  [1] Caller has URGENT medicine question about med that PCP or specialist prescribed AND [2] triager unable to answer question  Answer Assessment - Initial Assessment Questions 1. NAME of MEDICINE: "What medicine(s) are you calling about?"     Tresiba interacts with jaridance and psych medication (cymbalta 2. QUESTION: "What is your question?" (e.g., double dose of medicine, side effect)     Would like to be prescribed Mon[place]o  3. PRESCRIBER: "Who prescribed the medicine?" Reason: if prescribed by specialist, call should be referred to that group.     Dr Rosana Berger 4. SYMPTOMS: "Do you have any symptoms?" If Yes, ask: "What symptoms are you having?"  "How bad are the symptoms (e.g., mild, moderate, severe)     Felt likes body is buzzing, felt not thinking clearly 5. PREGNANCY:  "Is there any chance that you are pregnant?" "When was your last menstrual period?"     N/a  Protocols used: Medication Question Call-A-AH

## 2021-10-02 ENCOUNTER — Telehealth (INDEPENDENT_AMBULATORY_CARE_PROVIDER_SITE_OTHER): Payer: Medicare HMO | Admitting: Internal Medicine

## 2021-10-02 ENCOUNTER — Encounter: Payer: Self-pay | Admitting: Internal Medicine

## 2021-10-02 DIAGNOSIS — Z794 Long term (current) use of insulin: Secondary | ICD-10-CM

## 2021-10-02 DIAGNOSIS — E119 Type 2 diabetes mellitus without complications: Secondary | ICD-10-CM | POA: Diagnosis not present

## 2021-10-02 MED ORDER — EMPAGLIFLOZIN 25 MG PO TABS: 25.0000 mg | ORAL_TABLET | Freq: Every day | ORAL | 3 refills | Status: AC

## 2021-10-02 MED ORDER — OZEMPIC (0.25 OR 0.5 MG/DOSE) 2 MG/3ML ~~LOC~~ SOPN: 0.5000 mg | PEN_INJECTOR | SUBCUTANEOUS | 0 refills | Status: DC

## 2021-10-02 MED ORDER — LANTUS SOLOSTAR 100 UNIT/ML ~~LOC~~ SOPN: 25.0000 [IU] | PEN_INJECTOR | Freq: Every day | SUBCUTANEOUS | 99 refills | Status: DC

## 2021-10-02 NOTE — Progress Notes (Signed)
Virtual Visit via Video Note  I connected with Tara Levine on 10/02/21 at  8:00 AM EDT by a video enabled telemedicine application and verified that I am speaking with the correct person using two identifiers.  Location: Patient: Home Provider: [facility]   I discussed the limitations of evaluation and management by telemedicine and the availability of in person appointments. The patient expressed understanding and agreed to proceed.  History of Present Illness:  Diabetes, Type 2: -First diagnosed in 2011 -Last A1c 7/23 8.1% -Medications: Tresiba 25 units at night (had to stop because she is feeling a buzzing in her head that has not resolved since stopping the [place] 1 week ago), Novolog 20 units at meals. Ozempic 1 mg weekly.  Jardiance 25 mg started at [place].  -Patient is compliant with the above medications and reports no side effects. -Checking BG at home: <200 fasting. <200 in the afternoon. Denies low sugars.  -Diet: eats oatmeal, cornbread and black eyes peas  -Eye exam: Due, referral placed to ophthalmology previously -Foot exam: Seeing Podiatry for wounds on toe, appears to be healing.  Has been on multiple antibiotics  -Microalbumin: Up-to-date 3/23 -Statin: Yes -PNA vaccine: Prevnar 13 in 2015 -Has symptoms of diabetic neuropathy on Lyrica, also follows with podiatry for ulcer on right foot  Observations/Objective:  General: well appearing, no acute distress ENT: conjunctiva normal appearing bilaterally  Neuro: answers all questions appropriately   Assessment and Plan:  1. Type 2 diabetes mellitus without complication, with long-term current use of insulin [facility]): Discussed how changing so many medications can be causing side effects. She is at risk for polypharmacy. Referral to pharmacy placed today for education and diabetes medication management. She stopped the [place] to presumed side effects, this will be discontinued and switched back to Lantus 25 units at night.  Ozempic will be decreased back to the 0.5 mg weekly dosing. Continue Novolog 20 units at meals andJardiance 25 mg (refilled today). Follow up in 1 month for recheck, continue to monitor sugars closely.   - insulin glargine (LANTUS SOLOSTAR) 100 UNIT/ML Solostar Pen; Inject 25 Units into the skin daily.  Dispense: 15 mL; Refill: PRN - Semaglutide,0.25 or 0.'5MG'$ /DOS, (OZEMPIC, 0.25 OR 0.5 MG/DOSE,) 2 MG/3ML SOPN; Inject 0.5 mg into the skin once a week.  Dispense: 3 mL; Refill: 0 - empagliflozin (JARDIANCE) 25 MG TABS tablet; Take 1 tablet (25 mg total) by mouth daily before breakfast.  Dispense: 30 tablet; Refill: 3 - AMB [facility]  Follow Up Instructions: 1 month     I discussed the assessment and treatment plan with the patient. The patient was provided an opportunity to ask questions and all were answered. The patient agreed with the plan and demonstrated an understanding of the instructions.   The patient was advised to call back or seek an in-person evaluation if the symptoms worsen or if the condition fails to improve as anticipated.  I provided 20 minutes of non-face-to-face time during this encounter.   Teodora Medici, DO

## 2021-10-03 DIAGNOSIS — M722 Plantar fascial fibromatosis: Secondary | ICD-10-CM | POA: Diagnosis not present

## 2021-10-03 DIAGNOSIS — M778 Other enthesopathies, not elsewhere classified: Secondary | ICD-10-CM | POA: Diagnosis not present

## 2021-10-03 DIAGNOSIS — Z794 Long term (current) use of insulin: Secondary | ICD-10-CM | POA: Diagnosis not present

## 2021-10-03 DIAGNOSIS — L97512 Non-pressure chronic ulcer of other part of right foot with fat layer exposed: Secondary | ICD-10-CM | POA: Diagnosis not present

## 2021-10-03 DIAGNOSIS — E1140 Type 2 diabetes mellitus with diabetic neuropathy, unspecified: Secondary | ICD-10-CM | POA: Diagnosis not present

## 2021-10-04 ENCOUNTER — Telehealth: Payer: Medicare HMO | Admitting: Internal Medicine

## 2021-10-05 ENCOUNTER — Ambulatory Visit: Payer: Medicare PPO | Admitting: Urology

## 2021-10-05 DIAGNOSIS — G8929 Other chronic pain: Secondary | ICD-10-CM | POA: Diagnosis not present

## 2021-10-05 DIAGNOSIS — M7918 Myalgia, other site: Secondary | ICD-10-CM | POA: Diagnosis not present

## 2021-10-05 DIAGNOSIS — M545 Low back pain, unspecified: Secondary | ICD-10-CM | POA: Diagnosis not present

## 2021-10-05 DIAGNOSIS — M5412 Radiculopathy, cervical region: Secondary | ICD-10-CM | POA: Diagnosis not present

## 2021-10-12 DIAGNOSIS — L97512 Non-pressure chronic ulcer of other part of right foot with fat layer exposed: Secondary | ICD-10-CM | POA: Diagnosis not present

## 2021-10-18 ENCOUNTER — Telehealth (INDEPENDENT_AMBULATORY_CARE_PROVIDER_SITE_OTHER): Payer: Medicare HMO | Admitting: Internal Medicine

## 2021-10-18 ENCOUNTER — Encounter: Payer: Self-pay | Admitting: Internal Medicine

## 2021-10-18 ENCOUNTER — Telehealth: Payer: Self-pay

## 2021-10-18 ENCOUNTER — Telehealth: Payer: Self-pay | Admitting: Internal Medicine

## 2021-10-18 DIAGNOSIS — E1165 Type 2 diabetes mellitus with hyperglycemia: Secondary | ICD-10-CM

## 2021-10-18 DIAGNOSIS — Z794 Long term (current) use of insulin: Secondary | ICD-10-CM

## 2021-10-18 DIAGNOSIS — B3731 Acute candidiasis of vulva and vagina: Secondary | ICD-10-CM

## 2021-10-18 MED ORDER — FLUCONAZOLE 150 MG PO TABS: 150.0000 mg | ORAL_TABLET | Freq: Once | ORAL | 0 refills | Status: AC

## 2021-10-18 NOTE — Telephone Encounter (Signed)
Last rx for ozempic was filled on 8/14 for the '1mg'$  weekly dose

## 2021-10-18 NOTE — Telephone Encounter (Signed)
rx was sent to pharmacy today by provider. E-Prescribing Status: Receipt confirmed by pharmacy (10/18/2021 2:30 PM EDT)  Requested Prescriptions  Pending Prescriptions Disp Refills  . fluconazole (DIFLUCAN) 150 MG tablet [Pharmacy Med Name: FLUCONAZOLE 150 MG TABLET] 4 tablet     Sig: TAKE 1 TABLET (150 MG TOTAL) BY MOUTH ONCE A WEEK. FOR FOUR WEEKS.     Off-Protocol Failed - 10/18/2021  3:19 PM      Failed - Medication not assigned to a protocol, review manually.      Passed - Valid encounter within last 12 months    Recent Outpatient Visits          Today Uncontrolled type 2 diabetes mellitus with hyperglycemia, with long-term current use of insulin [facility]   [facility] Teodora Medici, DO   2 weeks ago Type 2 diabetes mellitus without complication, with long-term current use of insulin [facility])   [facility] Teodora Medici, DO   1 month ago Type 2 diabetes mellitus without complication, with long-term current use of insulin [facility])   [facility] Teodora Medici, DO   2 months ago Type 2 diabetes mellitus without complication, with long-term current use of insulin [facility])   [facility] Rory Percy M, DO   2 months ago Uncontrolled type 2 diabetes mellitus with hyperglycemia, with long-term current use of insulin [facility])   [facility] Teodora Medici, DO      Future Appointments            In 2 weeks Earlie Server, MD [facility] [place]   In 1 month Teodora Medici, [facility], [facility]

## 2021-10-18 NOTE — Telephone Encounter (Signed)
Pt.notified

## 2021-10-18 NOTE — Progress Notes (Signed)
Virtual Visit via Video Note  I connected with Shelanda Graul on 10/18/21 at  1:40 PM EDT by a video enabled telemedicine application and verified that I am speaking with the correct person using two identifiers.  Location: Patient: Home Provider: [facility]   I discussed the limitations of evaluation and management by telemedicine and the availability of in person appointments. The patient expressed understanding and agreed to proceed.  History of Present Illness:  Diabetes, Type 2: -First diagnosed in 2011 -Last A1c 7/23 8.1% -Medications: Back on Tresiba 25 units after switching to Lantus, Novolog sliding scale at meals, Jardiance 25 mg, Ozempic decreased to 1.0 mg but is taking both 1.0 mg and 0.25 mg "because that is what the pharmacy gave me". At [place] felt like Tyler Aas was causing tingling but has since reliazed that the Fluconazole powder was getting in her mouth and that was what was causing the tingling. She is now back on Tresiba and not lantus without any issues.  -Checking BG at home: 111-120 fasting. <200 in the afternoon. Denies low sugars.  -Diet: eats oatmeal, cornbread and black eyes peas  -Eye exam: Due, referral placed to ophthalmology previously -Foot exam: Seeing Podiatry for wounds on toe, has been on multiple antibiotics and is on a new course of antibiotics. Has yeast "everywhere" -Microalbumin: Up-to-date 3/23 -Statin: Yes -PNA vaccine: Prevnar 13 in 2015 -Has symptoms of diabetic neuropathy on Lyrica, also follows with podiatry for ulcer on right foot  Observations/Objective:  General: well appearing, no acute distress ENT: conjunctiva normal appearing bilaterally  Neuro: answers all questions appropriately  Psych: tearful   Assessment and Plan:  1. Uncontrolled type 2 diabetes mellitus with hyperglycemia, with long-term current use of insulin [facility]): Continued discussion of not changing medications around without consulting me. Back on Tresiba 25 units, doing her  own Novolog sliding scale that she cannot report. Has been taking both of the Ozempic 1.0 mg and 0.25 mg weekly siting pharmacy error. Called pharmacy, she was only given 1.0 mg to use weekly. Will continue this until she runs out, then will increase to 1.5 mg weekly. Continue Jardiance 25 mg. Referral to case management for help with chronic medical conditions and medications. Patient also extremely tearful that she needs help at home, with ADLs, strength and medications. Referral to home health placed today. Follow up in 6 weeks in person for A1c check. If it continues to be high will refer to Endocrinology.    - Ambulatory referral to [place] Referral to [place]  2. Vaginal yeast infection: Patient wants to be on chronic diflucan, I told her I will give her 3 doses.   - fluconazole (DIFLUCAN) 150 MG tablet; Take 1 tablet (150 mg total) by mouth once for 1 dose. For four weeks.  Dispense: 3 tablet; Refill: 0   Follow Up Instructions: 6 weeks in person     I discussed the assessment and treatment plan with the patient. The patient was provided an opportunity to ask questions and all were answered. The patient agreed with the plan and demonstrated an understanding of the instructions.   The patient was advised to call back or seek an in-person evaluation if the symptoms worsen or if the condition fails to improve as anticipated.  I provided 30 minutes of non-face-to-face time during this encounter.   Teodora Medici, DO

## 2021-10-20 ENCOUNTER — Emergency Department: Payer: Medicare HMO

## 2021-10-20 ENCOUNTER — Encounter: Payer: Self-pay | Admitting: Emergency Medicine

## 2021-10-20 ENCOUNTER — Emergency Department
Admission: EM | Admit: 2021-10-20 | Discharge: 2021-10-21 | Disposition: A | Discharge status: Home / Self Care | Payer: Medicare HMO | Attending: Emergency Medicine | Admitting: Emergency Medicine

## 2021-10-20 ENCOUNTER — Other Ambulatory Visit: Payer: Self-pay

## 2021-10-20 DIAGNOSIS — R079 Chest pain, unspecified: Secondary | ICD-10-CM

## 2021-10-20 DIAGNOSIS — R202 Paresthesia of skin: Secondary | ICD-10-CM | POA: Diagnosis not present

## 2021-10-20 DIAGNOSIS — R1011 Right upper quadrant pain: Secondary | ICD-10-CM | POA: Diagnosis not present

## 2021-10-20 DIAGNOSIS — Z7951 Long term (current) use of inhaled steroids: Secondary | ICD-10-CM | POA: Diagnosis not present

## 2021-10-20 DIAGNOSIS — Z91040 Latex allergy status: Secondary | ICD-10-CM | POA: Diagnosis not present

## 2021-10-20 DIAGNOSIS — Z794 Long term (current) use of insulin: Secondary | ICD-10-CM | POA: Diagnosis not present

## 2021-10-20 DIAGNOSIS — Z856 Personal history of leukemia: Secondary | ICD-10-CM | POA: Insufficient documentation

## 2021-10-20 DIAGNOSIS — Z79899 Other long term (current) drug therapy: Secondary | ICD-10-CM | POA: Diagnosis not present

## 2021-10-20 DIAGNOSIS — R109 Unspecified abdominal pain: Secondary | ICD-10-CM | POA: Diagnosis not present

## 2021-10-20 DIAGNOSIS — R456 Violent behavior: Secondary | ICD-10-CM | POA: Diagnosis not present

## 2021-10-20 DIAGNOSIS — J449 Chronic obstructive pulmonary disease, unspecified: Secondary | ICD-10-CM | POA: Insufficient documentation

## 2021-10-20 DIAGNOSIS — I5032 Chronic diastolic (congestive) heart failure: Secondary | ICD-10-CM | POA: Insufficient documentation

## 2021-10-20 DIAGNOSIS — R059 Cough, unspecified: Secondary | ICD-10-CM | POA: Diagnosis not present

## 2021-10-20 DIAGNOSIS — R0789 Other chest pain: Secondary | ICD-10-CM | POA: Insufficient documentation

## 2021-10-20 DIAGNOSIS — M79601 Pain in right arm: Secondary | ICD-10-CM | POA: Diagnosis not present

## 2021-10-20 DIAGNOSIS — E119 Type 2 diabetes mellitus without complications: Secondary | ICD-10-CM | POA: Insufficient documentation

## 2021-10-20 DIAGNOSIS — I110 Hypertensive heart disease with heart failure: Secondary | ICD-10-CM | POA: Diagnosis not present

## 2021-10-20 DIAGNOSIS — R1013 Epigastric pain: Secondary | ICD-10-CM | POA: Diagnosis not present

## 2021-10-20 DIAGNOSIS — M79602 Pain in left arm: Secondary | ICD-10-CM | POA: Diagnosis not present

## 2021-10-20 DIAGNOSIS — Z20822 Contact with and (suspected) exposure to covid-19: Secondary | ICD-10-CM | POA: Diagnosis not present

## 2021-10-20 DIAGNOSIS — K760 Fatty (change of) liver, not elsewhere classified: Secondary | ICD-10-CM | POA: Diagnosis not present

## 2021-10-20 LAB — D-DIMER, QUANTITATIVE: D-Dimer, Quant: 0.45 ug/mL-FEU (ref 0.00–0.50)

## 2021-10-20 LAB — CBC
HCT: 47.7 % — ABNORMAL HIGH (ref 36.0–46.0)
Hemoglobin: 15.3 g/dL — ABNORMAL HIGH (ref 12.0–15.0)
MCH: 26.6 pg (ref 26.0–34.0)
MCHC: 32.1 g/dL (ref 30.0–36.0)
MCV: 82.8 fL (ref 80.0–100.0)
Platelets: 325 10*3/uL (ref 150–400)
RBC: 5.76 MIL/uL — ABNORMAL HIGH (ref 3.87–5.11)
RDW: 14.8 % (ref 11.5–15.5)
WBC: 10.9 10*3/uL — ABNORMAL HIGH (ref 4.0–10.5)
nRBC: 0 % (ref 0.0–0.2)

## 2021-10-20 LAB — BASIC METABOLIC PANEL
Anion gap: 12 (ref 5–15)
BUN: 26 mg/dL — ABNORMAL HIGH (ref 8–23)
CO2: 27 mmol/L (ref 22–32)
Calcium: 10.1 mg/dL (ref 8.9–10.3)
Chloride: 99 mmol/L (ref 98–111)
Creatinine, Ser: 1.31 mg/dL — ABNORMAL HIGH (ref 0.44–1.00)
GFR, Estimated: 45 mL/min — ABNORMAL LOW (ref >60)
Glucose, Bld: 172 mg/dL — ABNORMAL HIGH (ref 70–99)
Potassium: 3.7 mmol/L (ref 3.5–5.1)
Sodium: 138 mmol/L (ref 135–145)

## 2021-10-20 LAB — SARS CORONAVIRUS 2 BY RT PCR: SARS Coronavirus 2 by RT PCR: NEGATIVE

## 2021-10-20 LAB — TROPONIN I (HIGH SENSITIVITY)
Troponin I (High Sensitivity): 8 ng/L (ref <18)
Troponin I (High Sensitivity): 9 ng/L (ref <18)

## 2021-10-20 LAB — CBG MONITORING, ED: Glucose-Capillary: 155 mg/dL — ABNORMAL HIGH (ref 70–99)

## 2021-10-20 MED ORDER — ONDANSETRON HCL 4 MG/2ML IJ SOLN
4.0000 mg | Freq: Once | INTRAMUSCULAR | Status: AC
  Administered 2021-10-20: 4 mg via INTRAVENOUS
  Filled 2021-10-20: qty 2

## 2021-10-20 MED ORDER — FAMOTIDINE IN NACL 20-0.9 MG/50ML-% IV SOLN
20.0000 mg | Freq: Once | INTRAVENOUS | Status: AC
  Administered 2021-10-21: 20 mg via INTRAVENOUS
  Filled 2021-10-20: qty 50

## 2021-10-20 MED ORDER — SODIUM CHLORIDE 0.9 % IV BOLUS
500.0000 mL | Freq: Once | INTRAVENOUS | Status: AC
  Administered 2021-10-20: 500 mL via INTRAVENOUS

## 2021-10-20 NOTE — ED Notes (Signed)
This RN had started an IV to the RAC. Pt immediately told me this was an unacceptable place and to take it out which I did. Then educated her on the importance of the IV and necessity of it for specific tests. After this RN collected the blood, the MD placed another order for a new tube of blood. This RN talked with the pt and she is refusing the IV at this time and sts that she would just allow me to straight stick her and maybe allow an IV if it's deemed necessary later.

## 2021-10-20 NOTE — ED Provider Notes (Signed)
[facility] Provider Note    Event Date/Time   First MD Initiated Contact with Patient 10/20/21 2305     (approximate)   History   Chest Pain   HPI  Tara Levine is a 66 y.o. female brought to the ED via EMS from home with a chief complaint of chest tightness for 2 weeks.  Exacerbated by eating and associated with left greater than right arm pain.  Patient reports she breaks out in sweats when she has these pain.  Also endorses dry cough and nausea.  Denies fever, shortness of breath, palpitations, vomiting or dizziness.  States she thinks the problem is with her gallbladder.  Denies fever, chills, dysuria or diarrhea.     Past Medical History   Past Medical History:  Diagnosis Date   CML (chronic myelocytic leukemia) ([place])    Depression    DM type 2 (diabetes mellitus, type 2) (HCC)    Environmental allergies    History of gastric ulcer    HLD (hyperlipidemia)    IDA (iron deficiency anemia)    chronic   Leukemia (HCC)    Leukemia, chronic myeloid (HCC)    Osteoarthritis    Panic disorder    PSS (progressive systemic sclerosis) ([place])    Unspecified essential hypertension      Active Problem List   Patient Active Problem List   Diagnosis Date Noted   Encounter for antineoplastic chemotherapy 08/05/2021   Gastroesophageal reflux disease 06/12/2021   Anemia 12/27/2020   Elevated serum creatinine 12/27/2020   Uncontrolled type 2 diabetes mellitus with hyperglycemia, with long-term current use of insulin ([place])    Accidental fall 06/22/2019   Closed displaced osteochondral fracture of left patella 06/22/2019   Obesity, Class III, BMI 40-49.9 (morbid obesity) ([place]) 06/22/2019   Type 2 diabetes mellitus without complication ([place]) 92/12/9415   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   Anxiety 06/22/2019   Left anterior knee pain 06/22/2019   Intractable pain 06/22/2019   Risk for falls 06/20/2017   Major depression, recurrent,  chronic ([place]) 08/03/2015   Bilateral pneumonia 09/07/2014   OSA (obstructive sleep apnea) 09/07/2014   Obesity hypoventilation syndrome ([place]) 09/07/2014   Hyponatremia 09/07/2014   Morbid obesity ([place]) 09/07/2014   Hyperkalemia 09/07/2014   Spinal stenosis, 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   Chronic back pain 09/07/2014   Intertrigo 09/07/2014   Constipation 09/07/2014   Acute respiratory failure with hypoxia (HCC) 08/31/2014   Chronic diastolic CHF (congestive heart failure) ([place]) 05/18/2014   Acute bronchitis 04/05/2014   Severe obesity (BMI >= 40) (HCC) 02/04/2014   Cough 01/31/2014   Snoring 01/31/2014   SOB (shortness of breath) 01/31/2014   Post-nasal drip 01/31/2014   Clinical depression 11/19/2013   Blood in sputum 10/19/2013   Cephalalgia 09/24/2012   Essential (primary) hypertension 09/08/2012   Arthritis, degenerative 09/08/2012   Diverticulitis 07/31/2012   Fibromyalgia 07/31/2012   PNA (pneumonia) 07/30/2012   Chronic generalized pain 06/26/2012   Diabetes mellitus ([place]) 06/26/2012   Gastric catarrh 06/26/2012   BP (high blood pressure) 06/26/2012   Cellulitis and abscess of trunk 03/26/2012   Cervical pain 02/17/2012   LBP (low back pain) 02/17/2012   Hypercholesterolemia 05/27/2011   Abnormal EKG 04/02/2011   Chest tightness 04/02/2011   Chronic pain 04/02/2011   HTN (hypertension) 04/02/2011   Anxiety and depression 04/02/2011   Back ache 01/15/2011   CML (chronic myelocytic leukemia) ([place]) 01/03/2011     Past Surgical History   Past Surgical History:  Procedure Laterality Date   back and neck surgery  1999, 2009   had rods placed   [facility]) No blackages found.    CARPAL TUNNEL RELEASE     PARTIAL HYSTERECTOMY     age 23, no cancer     Home Medications   Prior to Admission medications   Medication Sig Start Date End Date Taking? Authorizing Provider  ondansetron (ZOFRAN-ODT) 4 MG disintegrating  tablet Take 1 tablet (4 mg total) by mouth every 8 (eight) hours as needed for nausea or vomiting. 10/21/21  Yes Paulette Blanch, MD  sucralfate (CARAFATE) 1 GM/10ML suspension Take 10 mLs (1 g total) by mouth 4 (four) times daily. 10/21/21  Yes Paulette Blanch, MD  ACCU-CHEK GUIDE test strip TEST ONCE DAILY AND AS DIRECTED. 06/27/17   [provider]  ACCU-CHEK SOFTCLIX LANCETS lancets Use 1 each 3 (three) times daily. Use as instructed. 09/11/11   [provider]  albuterol (PROVENTIL HFA;VENTOLIN HFA) 108 (90 BASE) MCG/ACT inhaler Inhale 2 puffs into the lungs every 6 (six) hours as needed for wheezing or shortness of breath.    [provider]  albuterol (PROVENTIL) (2.5 MG/3ML) 0.083% nebulizer solution INHALE CONTENTS OF 1 VIAL VIA NEBULIZER THREE TIMES A DAY prn 03/08/15   [provider]  ALPRAZolam Duanne Moron) 1 MG tablet Take 1 tablet (1 mg total) by mouth 3 (three) times daily. 09/27/21   Clapacs, Madie Reno, MD  amLODipine (NORVASC) 5 MG tablet Take 1 tablet (5 mg total) by mouth daily. 05/30/21 08/28/21  Teodora Medici, DO  Ascorbic Acid (VITAMIN C) 1000 MG tablet Take 3,000 mg by mouth daily.    [provider]  atorvastatin (LIPITOR) 20 MG tablet Take 1 tablet (20 mg total) by mouth daily. 06/12/21   Teodora Medici, DO  baclofen (LIORESAL) 10 MG tablet Take 10 mg by mouth 2 (two) times daily. 1/2 tab 04/30/21   [provider]  Blood Glucose Monitoring Suppl (GLUCOCOM BLOOD GLUCOSE MONITOR) [place]  09/11/11   [provider]  bosutinib (BOSULIF) 400 MG tablet Take 1 tablet (400 mg total) by mouth daily with breakfast. 09/27/21   Darl Pikes, RPH-CPP  celecoxib (CELEBREX) 100 MG capsule  04/02/21   [provider]  cetirizine (ZYRTEC) 10 MG tablet Take 20 mg by mouth daily as needed.     [provider]  Cholecalciferol (VITAMIN D PO) Take 1 tablet by mouth daily.    [provider]  co-enzyme Q-10 50 MG capsule Take 50  mg by mouth daily.    [provider]  Continuous Blood Gluc Receiver ([place]) [place] 1 each by Does not apply route every 14 (fourteen) days. 08/31/21   Teodora Medici, DO  Continuous Blood Gluc Sensor (DEXCOM G7 SENSOR) MISC 1 each by Does not apply route every 14 (fourteen) days. 08/31/21   Teodora Medici, DO  diphenhydrAMINE (BENADRYL) 25 MG tablet Take 25 mg by mouth every 6 (six) hours as needed for itching or allergies.     [provider]  DULoxetine (CYMBALTA) 20 MG capsule Take 1 capsule (20 mg total) by mouth in the morning, at noon, in the evening, and at bedtime. 09/27/21   Clapacs, Madie Reno, MD  empagliflozin (JARDIANCE) 25 MG TABS tablet Take 1 tablet (25 mg total) by mouth daily before breakfast. 10/02/21   Teodora Medici, DO  fluticasone [facility]) 50 MCG/ACT nasal spray Place 1-2 sprays into both  nostrils as needed for rhinitis.     [provider]  furosemide (LASIX) 40 MG tablet Take 1 tablet (40 mg total) by mouth daily. 01/23/15   Cammie Sickle, MD  insulin aspart (NOVOLOG) 100 UNIT/ML injection Inject 10 Units into the skin 3 (three) times daily as needed for high blood sugar. Pt reports she is taking 2-8 units per sliding scale based on glucose    [provider]  insulin glargine (LANTUS SOLOSTAR) 100 UNIT/ML Solostar Pen Inject 25 Units into the skin daily. Patient not taking: Reported on 10/18/2021 10/02/21   Teodora Medici, DO  NOVOLOG FLEXPEN 100 UNIT/ML FlexPen Inject into the skin. 05/29/21   [provider]  nystatin (MYCOSTATIN/NYSTOP) powder Apply 1 Application topically 3 (three) times daily. 08/15/21   Myles Gip, DO  nystatin cream (MYCOSTATIN) Apply 1 Application topically 2 (two) times daily. 08/10/21   Teodora Medici, DO  Omega-3 Fatty Acids (OMEGA 3 PO) Take 3 capsules by mouth daily.    [provider]  omeprazole (PRILOSEC) 40 MG capsule Take 40 mg by mouth daily. Reported on  04/21/2015    [provider]  Oxycodone HCl 10 MG TABS Take 10 mg by mouth as needed.    [provider]  polyethylene glycol (MIRALAX MIX-IN PAX) 17 g packet Take 17 g by mouth daily as needed. 06/25/19   Dhungel, Nishant, MD  pregabalin (LYRICA) 300 MG capsule Take 1 capsule (300 mg total) by mouth 2 (two) times daily. 09/27/21   Clapacs, Madie Reno, MD  Semaglutide,0.25 or 0.'5MG'$ /DOS, (OZEMPIC, 0.25 OR 0.5 MG/DOSE,) 2 MG/3ML SOPN Inject 0.5 mg into the skin once a week. 10/02/21   Teodora Medici, DO  vitamin B-12 (CYANOCOBALAMIN) 1000 MCG tablet Take 4,000 mcg by mouth daily.    [provider]     Allergies  Morphine, Codeine, Latex, Morphine and related, Relafen [nabumetone], Nsaids, and Tape   Family History   Family History  Problem Relation Age of Onset   Diabetes Mother    Hypertension Mother    Heart disease Mother    Prostate cancer Father    Heart disease Father    Diabetes Brother      Physical Exam  Triage Vital Signs: ED Triage Vitals  Enc Vitals Group     BP 10/20/21 1900 (!) 158/109     Pulse Rate 10/20/21 1900 96     Resp 10/20/21 1900 20     Temp 10/20/21 1900 98.5 F (36.9 C)     Temp Source 10/20/21 1900 Oral     SpO2 10/20/21 1900 96 %     Weight 10/20/21 1917 260 lb (117.9 kg)     Height 10/20/21 1917 '5\' 7"'$  (1.702 m)     Head Circumference --      Peak Flow --      Pain Score 10/20/21 1917 7     Pain Loc --      Pain Edu? --      Excl. in [place]? --     Updated Vital Signs: BP 115/77   Pulse 73   Temp 98.2 F (36.8 C) (Oral)   Resp 13   Ht '5\' 7"'$  (1.702 m)   Wt 117.9 kg   SpO2 96%   BMI 40.72 kg/m    General: Awake, no distress.  CV:  RRR.  Good peripheral perfusion.  Resp:  Normal effort.  CTA B. Abd:  Obese. Mildly tender to palpation epigastrium/RUQ and right lower quadrant without  rebound or guarding. No distention.  Other:     ED Results / Procedures / Treatments  Labs (all labs ordered are listed, but  only abnormal results are displayed) Labs Reviewed  BASIC METABOLIC PANEL - Abnormal; Notable for the following components:      Result Value   Glucose, Bld 172 (*)    BUN 26 (*)    Creatinine, Ser 1.31 (*)    GFR, Estimated 45 (*)    All other components within normal limits  CBC - Abnormal; Notable for the following components:   WBC 10.9 (*)    RBC 5.76 (*)    Hemoglobin 15.3 (*)    HCT 47.7 (*)    All other components within normal limits  HEPATIC FUNCTION PANEL - Abnormal; Notable for the following components:   Total Protein 8.6 (*)    All other components within normal limits  CBG MONITORING, ED - Abnormal; Notable for the following components:   Glucose-Capillary 155 (*)    All other components within normal limits  SARS CORONAVIRUS 2 BY RT PCR  D-DIMER, QUANTITATIVE  LIPASE, BLOOD  TROPONIN I (HIGH SENSITIVITY)  TROPONIN I (HIGH SENSITIVITY)     EKG  ED ECG REPORT I, Rosali Augello J, the attending physician, personally viewed and interpreted this ECG.   Date: 10/20/2021  EKG Time: 1910  Rate: 98  Rhythm: normal sinus rhythm  Axis: LAD  Intervals:left anterior fascicular block  ST&T Change: Nonspecific    RADIOLOGY I have independently visualized and interpreted patient's cxr and [place] as well as the radiology interpretation:   CXR: Minimal bronchitic changes  [place]: Unremarkable; fatty liver  CT abdomen/pelvis: Negative  Official radiology report(s): CT Abdomen Pelvis W Contrast  Result Date: 10/21/2021 CLINICAL DATA:  Abdominal pain, history of leukemia EXAM: CT ABDOMEN AND PELVIS WITH CONTRAST TECHNIQUE: Multidetector CT imaging of the abdomen and pelvis was performed using the standard protocol following bolus administration of intravenous contrast. RADIATION DOSE REDUCTION: This exam was performed according to the departmental dose-optimization program which includes automated exposure control, adjustment of the mA and/or kV according to patient size and/or  use of iterative reconstruction technique. CONTRAST:  160m OMNIPAQUE IOHEXOL 350 MG/ML SOLN COMPARISON:  None Available. FINDINGS: Lower chest: Lung bases are clear. Hepatobiliary: Liver is within normal limits. Gallbladder is unremarkable. No infected or extrahepatic duct dilatation. Pancreas: Within normal limits. Spleen: Within normal limits. Adrenals/Urinary Tract: Adrenal glands are within normal limits. Kidneys are within normal limits.  No hydronephrosis. Bladder is within normal limits. Stomach/Bowel: Stomach is notable for a tiny hiatal hernia. No evidence of bowel obstruction. Normal appendix (series 2/image 2). No colonic wall thickening or inflammatory changes. Vascular/Lymphatic: No evidence of abdominal aortic aneurysm. Atherosclerotic calcifications of the abdominal aorta and branch vessels. No suspicious abdominopelvic lymphadenopathy. Reproductive: Status post hysterectomy. Bilateral ovaries are within normal limits. Other: No abdominopelvic ascites. Musculoskeletal: Status post PLIF at L2-S1. Degenerative changes of the visualized thoracolumbar spine. IMPRESSION: No CT findings to account for the patient's abdominal pain. Status post hysterectomy. Electronically Signed   By: SJulian HyM.D.   On: 10/21/2021 01:21   [place] LIMITED RUQ (LIVER/GB)  Result Date: 10/21/2021 CLINICAL DATA:  Epigastric pain EXAM: ULTRASOUND ABDOMEN LIMITED RIGHT UPPER QUADRANT COMPARISON:  None Available. FINDINGS: Gallbladder: No gallstones or wall thickening visualized. No sonographic Murphy sign noted by sonographer. Common bile duct: Diameter: Normal caliber, 4 mm Liver: Increased echotexture compatible with fatty infiltration. No focal abnormality or biliary ductal dilatation. Portal vein is  patent on color Doppler imaging with normal direction of blood flow towards the liver. Other: None. IMPRESSION: Fatty infiltration of the liver. No acute findings. Electronically Signed   By: Rolm Baptise M.D.    On: 10/21/2021 00:30   DG Chest 2 View  Result Date: 10/20/2021 CLINICAL DATA:  Chest pain and tightness for the past 2 weeks with left arm pain. Dry cough. EXAM: CHEST - 2 VIEW COMPARISON:  05/22/2021 FINDINGS: Normal sized heart. Tortuous aorta. Clear lungs with normal vascularity. Minimal peribronchial thickening. Thoracic spine degenerative changes and mild scoliosis. IMPRESSION: Minimal bronchitic changes. Electronically Signed   By: Claudie Revering M.D.   On: 10/20/2021 20:04     PROCEDURES:  Critical Care performed: No  .1-3 Lead EKG Interpretation  Performed by: Paulette Blanch, MD Authorized by: Paulette Blanch, MD     Interpretation: normal     ECG rate:  98   ECG rate assessment: normal     Rhythm: sinus rhythm     Ectopy: none     Conduction: normal   Comments:     Patient placed on cardiac monitor to evaluate for arrthymias    MEDICATIONS ORDERED IN ED: Medications  famotidine (PEPCID) IVPB 20 mg premix (0 mg Intravenous Stopped 10/21/21 0030)  ondansetron (ZOFRAN) injection 4 mg (4 mg Intravenous Given 10/20/21 2358)  sodium chloride 0.9 % bolus 500 mL (0 mLs Intravenous Stopped 10/21/21 0100)  HYDROmorphone (DILAUDID) injection 0.5 mg (0.5 mg Intravenous Given 10/21/21 0044)  iohexol (OMNIPAQUE) 350 MG/ML injection 100 mL (100 mLs Intravenous Contrast Given 10/21/21 0058)     IMPRESSION / MDM / ASSESSMENT AND PLAN / ED COURSE  I reviewed the triage vital signs and the nursing notes.                             66 year old female presenting with upper abdominal/chest pain. Differential diagnosis includes, but is not limited to, ACS, aortic dissection, pulmonary embolism, cardiac tamponade, pneumothorax, pneumonia, pericarditis, myocarditis, GI-related causes including esophagitis/gastritis, and musculoskeletal chest wall pain.   I have personally reviewed patient's records and note her podiatry office visit from 10/03/2021 for right foot skin ulcer for which she is on  antibiotics currently.  Patient's presentation is most consistent with acute presentation with potential threat to life or bodily function.  The patient is on the cardiac monitor to evaluate for evidence of arrhythmia and/or significant heart rate changes.  Laboratory results demonstrate minimal leukocytosis WBC 10.9, mild AKI BUN 26/creatinine 1.31.  2 sets of troponin are negative.  D-dimer negative.  COVID-negative.  We will add hepatic panel/lipase, obtain right upper quadrant abdominal ultrasound to start.  Initiate IV fluid hydration, IV Pepcid.  With IV Zofran for nausea.  Will reassess.  Clinical Course as of 10/21/21 0413  Nancy Fetter Oct 21, 2021  0108 Ultrasound was unremarkable.  Given patient's right-sided pain, will proceed with CT abdomen/pelvis.  Low-dose IV Dilaudid given for pain. [JS]  0125 Updated patient and son on negative CT scan.  Will discharge home on Carafate and as needed Zofran.  She will follow-up closely with her PCP.  Strict return precautions given.  Both verbalized understanding agree with plan of care. [JS]    Clinical Course User Index [JS] Paulette Blanch, MD     FINAL CLINICAL IMPRESSION(S) / ED DIAGNOSES   Final diagnoses:  Nonspecific chest pain  Right upper quadrant abdominal pain  Rx / DC Orders   ED Discharge Orders          Ordered    sucralfate (CARAFATE) 1 GM/10ML suspension  4 times daily        10/21/21 0130    ondansetron (ZOFRAN-ODT) 4 MG disintegrating tablet  Every 8 hours PRN        10/21/21 0130             Note:  This document was prepared using Dragon voice recognition software and may include unintentional dictation errors.   Paulette Blanch, MD 10/21/21 [phone]

## 2021-10-20 NOTE — ED Notes (Signed)
ED Provider at bedside. 

## 2021-10-20 NOTE — ED Triage Notes (Signed)
Pt reports chest tightness for 2 weeks, and arm pain mostly in left arm. Pt reports breaks pout in sweats.  Pt reports dry cough. Pt talks in complete sentences no respiratory  distress noted

## 2021-10-20 NOTE — ED Notes (Signed)
Pt brought to ED rm 16 at this time, this RN now assuming care. Pt was in a recliner in the waiting room and sts that she does not want to get out of the chair right now.

## 2021-10-20 NOTE — ED Triage Notes (Signed)
First Nurse Note: Pt in via EMS from home with c/o pain and aches all over. Pt yelling at EMS. Pt ambulatory. Pt refused IV for EMS.

## 2021-10-20 NOTE — ED Notes (Signed)
Pt disconnected self from monitors and ambulated to restroom. Writer attempted to stop patient to apply shoes or non slip socks and patient refused. Primary RN aware.

## 2021-10-21 ENCOUNTER — Emergency Department: Payer: Medicare HMO

## 2021-10-21 DIAGNOSIS — K760 Fatty (change of) liver, not elsewhere classified: Secondary | ICD-10-CM | POA: Diagnosis not present

## 2021-10-21 DIAGNOSIS — R1013 Epigastric pain: Secondary | ICD-10-CM | POA: Diagnosis not present

## 2021-10-21 DIAGNOSIS — R109 Unspecified abdominal pain: Secondary | ICD-10-CM | POA: Diagnosis not present

## 2021-10-21 DIAGNOSIS — R0789 Other chest pain: Secondary | ICD-10-CM | POA: Diagnosis not present

## 2021-10-21 LAB — HEPATIC FUNCTION PANEL
ALT: 33 U/L (ref 0–44)
AST: 31 U/L (ref 15–41)
Albumin: 4.4 g/dL (ref 3.5–5.0)
Alkaline Phosphatase: 82 U/L (ref 38–126)
Bilirubin, Direct: <0.1 mg/dL (ref 0.0–0.2)
Total Bilirubin: 0.8 mg/dL (ref 0.3–1.2)
Total Protein: 8.6 g/dL — ABNORMAL HIGH (ref 6.5–8.1)

## 2021-10-21 LAB — LIPASE, BLOOD: Lipase: 33 U/L (ref 11–51)

## 2021-10-21 MED ORDER — SUCRALFATE 1 GM/10ML PO SUSP: 1.0000 g | Freq: Four times a day (QID) | ORAL | 1 refills | Status: DC

## 2021-10-21 MED ORDER — ONDANSETRON 4 MG PO TBDP: 4.0000 mg | ORAL_TABLET | Freq: Three times a day (TID) | ORAL | 0 refills | Status: AC | PRN

## 2021-10-21 MED ORDER — HYDROMORPHONE HCL 1 MG/ML IJ SOLN
0.5000 mg | Freq: Once | INTRAMUSCULAR | Status: AC
  Administered 2021-10-21: 0.5 mg via INTRAVENOUS
  Filled 2021-10-21: qty 0.5

## 2021-10-21 MED ORDER — IOHEXOL 350 MG/ML SOLN
100.0000 mL | Freq: Once | INTRAVENOUS | Status: AC | PRN
Start: 2021-10-21 — End: 2021-10-21
  Administered 2021-10-21: 100 mL via INTRAVENOUS

## 2021-10-21 NOTE — ED Notes (Signed)
US at bedside

## 2021-10-21 NOTE — ED Notes (Signed)
Signing pad did not work. Pt verbalized understanding of DC instructions and prescriptions.

## 2021-10-21 NOTE — ED Notes (Signed)
Pt sts she does not want the pulse ox on or the BP cuff on.

## 2021-10-21 NOTE — Discharge Instructions (Signed)
1.  Start Carafate 3 times daily with meals and at bedtime. 2.  You may take Zofran as needed for nausea. 3.  Bland diet x5 days, then slowly advance diet as tolerated. 4.  Return to the ER for worsening symptoms, persistent vomiting, difficulty breathing or other concerns.

## 2021-10-21 NOTE — ED Notes (Signed)
Pt taken to CT.

## 2021-10-21 NOTE — ED Notes (Signed)
Pt returned from CT °

## 2021-10-21 NOTE — ED Notes (Signed)
ED Provider at bedside. 

## 2021-10-22 ENCOUNTER — Other Ambulatory Visit: Payer: Self-pay | Admitting: Internal Medicine

## 2021-10-22 ENCOUNTER — Telehealth: Payer: Self-pay | Admitting: Internal Medicine

## 2021-10-22 ENCOUNTER — Inpatient Hospital Stay: Payer: Medicare HMO | Attending: Oncology

## 2021-10-22 DIAGNOSIS — B3731 Acute candidiasis of vulva and vagina: Secondary | ICD-10-CM

## 2021-10-22 NOTE — Telephone Encounter (Signed)
Copied from [place] [id number]. Topic: General - Other >> Oct 22, 2021 10:57 AM Carroll Kinds wrote: Reason for CRM: Pt was seen at [facility] on 8/26. Would like [name] to review her hosp records and the CT scans and labs. Pt is very concerned about what she read in [place]. She has a hosp follow-up scheduled for 9/1.

## 2021-10-23 ENCOUNTER — Telehealth: Payer: Self-pay | Admitting: *Deleted

## 2021-10-23 NOTE — Telephone Encounter (Signed)
Requested medications are due for refill today.  Unsure  Requested medications are on the active medications list.  no  Last refill. Per med list 08/31/2021 #4 0 refills, per encounter 10/18/2021  Future visit scheduled.   yes  Notes to clinic.  Refill not delegated.    Requested Prescriptions  Pending Prescriptions Disp Refills   fluconazole (DIFLUCAN) 150 MG tablet [Pharmacy Med Name: FLUCONAZOLE 150 MG TABLET] 4 tablet 0    Sig: Take 1 tablet (150 mg total) by mouth once a week. For four weeks.     Off-Protocol Failed - 10/22/2021  1:46 PM      Failed - Medication not assigned to a protocol, review manually.      Passed - Valid encounter within last 12 months    Recent Outpatient Visits           5 days ago Uncontrolled type 2 diabetes mellitus with hyperglycemia, with long-term current use of insulin [facility])   [facility] Teodora Medici, DO   3 weeks ago Type 2 diabetes mellitus without complication, with long-term current use of insulin [facility])   [facility] Teodora Medici, DO   1 month ago Type 2 diabetes mellitus without complication, with long-term current use of insulin [facility])   [facility] Teodora Medici, DO   2 months ago Type 2 diabetes mellitus without complication, with long-term current use of insulin [facility])   [facility] Rory Percy M, DO   2 months ago Uncontrolled type 2 diabetes mellitus with hyperglycemia, with long-term current use of insulin [facility])   [facility] Teodora Medici, DO       Future Appointments             In 3 days Teodora Medici, DO [facility], [place]   In 1 week Earlie Server, MD [facility] [place]   In 1 month Teodora Medici, [facility], [facility]

## 2021-10-23 NOTE — Telephone Encounter (Signed)
Copied from [place] [id number]. Topic: General - Inquiry >> Oct 23, 2021  1:11 PM Tara Levine wrote: Reason for CRM: Pt stated she is returning [name] call. Asked if a voicemail was left, pt stated no, her data is off, and she is unsure if a voicemail was left; she is just returning the call.  I advised pt it may have been from the care coordinator Laverda Sorenson.  Please advise.

## 2021-10-23 NOTE — Chronic Care Management (AMB) (Signed)
  Chronic Care Management   Outreach Note  10/23/2021 Name: Medha Pippen MRN: [id number] DOB: 04-28-1955  Sigrid Madrigal is a 66 y.o. year old female who is a primary care patient of Teodora Medici, DO. I reached out to [employer] by phone today in response to a referral sent by Ms. Kaarin Heidinger's primary care provider.  An unsuccessful telephone outreach was attempted today. The patient was referred to the case management team for assistance with care management and care coordination.   Follow Up Plan: The care management team will reach out to the patient again over the next 7 days.  If patient returns call to provider office, please advise to call [place] at [phone].  [place]  Direct Dial: [phone]

## 2021-10-23 NOTE — Chronic Care Management (AMB) (Signed)
  Chronic Care Management   Note  10/23/2021 Name: Tara Levine MRN: [id number] DOB: 04/01/1955  Tara Levine is a 66 y.o. year old female who is a primary care patient of Teodora Medici, DO. I reached out to [employer] by phone today in response to a referral sent by Tara Levine's PCP.  Tara Levine was given information about Chronic Care Management services today including:  CCM service includes personalized support from designated clinical staff supervised by her physician, including individualized plan of care and coordination with other care providers 24/7 contact phone numbers for assistance for urgent and routine care needs. Service will only be billed when office clinical staff spend 20 minutes or more in a month to coordinate care. Only one practitioner may furnish and bill the service in a calendar month. The patient may stop CCM services at any time (effective at the end of the month) by phone call to the office staff. The patient is responsible for co-pay (up to 20% after annual deductible is met) if co-pay is required by the individual health plan.   Patient wishes to consider information provided and/or speak with [name] before deciding about enrollment in care management services.   Follow up plan: The care management team is available to follow up with the patient after provider conversation with the patient regarding recommendation for care management engagement.    [place]  Direct Dial: [phone]

## 2021-10-24 ENCOUNTER — Other Ambulatory Visit: Payer: Self-pay

## 2021-10-24 NOTE — Patient Outreach (Signed)
[facility]) Care Management  10/24/2021  Tara Levine 1955-12-18 [id number]   Referral Date: 10/23/21 Referral Source: MD referral   Referral Reason: Disease Management   Outreach Attempt: No answer. Unable to leave a message.    Plan: RN CM will attempt again within 4 business days and send letter.    Jone Baseman, RN, MSN [facility] Care Management Care Management Coordinator Direct Line [phone] Toll Free: [phone]  Fax: [phone]

## 2021-10-25 ENCOUNTER — Other Ambulatory Visit: Payer: Self-pay | Admitting: Internal Medicine

## 2021-10-25 ENCOUNTER — Telehealth: Payer: Self-pay | Admitting: Internal Medicine

## 2021-10-25 ENCOUNTER — Other Ambulatory Visit (HOSPITAL_COMMUNITY): Payer: Self-pay

## 2021-10-25 ENCOUNTER — Other Ambulatory Visit: Payer: Self-pay

## 2021-10-25 DIAGNOSIS — E1165 Type 2 diabetes mellitus with hyperglycemia: Secondary | ICD-10-CM

## 2021-10-25 MED ORDER — GLUCOCOM BLOOD GLUCOSE MONITOR DEVI: 0 refills | Status: AC

## 2021-10-25 MED ORDER — ALCOHOL SWABS PADS: MEDICATED_PAD | 2 refills | Status: DC

## 2021-10-25 MED ORDER — ACCU-CHEK SOFTCLIX LANCETS MISC: 2 refills | Status: DC

## 2021-10-25 MED ORDER — ACCU-CHEK GUIDE VI STRP
ORAL_STRIP | 3 refills | Status: DC
Start: 2021-10-25 — End: 2022-01-07

## 2021-10-25 NOTE — Telephone Encounter (Signed)
[facility] podiatry representative calling to ask if dr Rosana Berger will refer pt to [facility]. Pt calling there today and asking for referral b/c she said Dr Rosana Berger told her she cannot refer outside of [facility]. The rep thinks pt may be somewhat confused on things.  Pt has appt tomorrow w/ Dr Rosana Berger.

## 2021-10-26 ENCOUNTER — Ambulatory Visit
Admission: RE | Admit: 2021-10-26 | Discharge: 2021-10-26 | Disposition: A | Discharge status: Home / Self Care | Payer: Medicare HMO | Attending: Internal Medicine | Admitting: Internal Medicine

## 2021-10-26 ENCOUNTER — Ambulatory Visit (INDEPENDENT_AMBULATORY_CARE_PROVIDER_SITE_OTHER): Payer: Medicare HMO | Admitting: Internal Medicine

## 2021-10-26 ENCOUNTER — Inpatient Hospital Stay: Payer: Medicare HMO | Attending: Oncology

## 2021-10-26 ENCOUNTER — Encounter: Payer: Self-pay | Admitting: Internal Medicine

## 2021-10-26 ENCOUNTER — Ambulatory Visit: Payer: Medicare HMO | Admitting: Internal Medicine

## 2021-10-26 ENCOUNTER — Ambulatory Visit
Admission: RE | Admit: 2021-10-26 | Discharge: 2021-10-26 | Disposition: A | Discharge status: Home / Self Care | Payer: Medicare HMO | Source: Ambulatory Visit | Attending: Internal Medicine | Admitting: Internal Medicine

## 2021-10-26 VITALS — BP 118/70 | HR 69 | Temp 97.7°F | Resp 18 | Ht 67.0 in | Wt 275.9 lb

## 2021-10-26 DIAGNOSIS — E1140 Type 2 diabetes mellitus with diabetic neuropathy, unspecified: Secondary | ICD-10-CM | POA: Diagnosis not present

## 2021-10-26 DIAGNOSIS — W19XXXA Unspecified fall, initial encounter: Secondary | ICD-10-CM

## 2021-10-26 DIAGNOSIS — K219 Gastro-esophageal reflux disease without esophagitis: Secondary | ICD-10-CM | POA: Diagnosis not present

## 2021-10-26 DIAGNOSIS — N1831 Chronic kidney disease, stage 3a: Secondary | ICD-10-CM | POA: Diagnosis not present

## 2021-10-26 DIAGNOSIS — M25531 Pain in right wrist: Secondary | ICD-10-CM | POA: Diagnosis not present

## 2021-10-26 DIAGNOSIS — S0990XA Unspecified injury of head, initial encounter: Secondary | ICD-10-CM

## 2021-10-26 DIAGNOSIS — E1165 Type 2 diabetes mellitus with hyperglycemia: Secondary | ICD-10-CM | POA: Insufficient documentation

## 2021-10-26 DIAGNOSIS — C9210 Chronic myeloid leukemia, BCR/ABL-positive, not having achieved remission: Secondary | ICD-10-CM | POA: Diagnosis not present

## 2021-10-26 DIAGNOSIS — E611 Iron deficiency: Secondary | ICD-10-CM | POA: Insufficient documentation

## 2021-10-26 DIAGNOSIS — M542 Cervicalgia: Secondary | ICD-10-CM | POA: Diagnosis not present

## 2021-10-26 DIAGNOSIS — M25511 Pain in right shoulder: Secondary | ICD-10-CM

## 2021-10-26 DIAGNOSIS — R5383 Other fatigue: Secondary | ICD-10-CM | POA: Insufficient documentation

## 2021-10-26 DIAGNOSIS — Z981 Arthrodesis status: Secondary | ICD-10-CM | POA: Insufficient documentation

## 2021-10-26 DIAGNOSIS — M722 Plantar fascial fibromatosis: Secondary | ICD-10-CM | POA: Diagnosis not present

## 2021-10-26 DIAGNOSIS — M25532 Pain in left wrist: Secondary | ICD-10-CM

## 2021-10-26 DIAGNOSIS — L97511 Non-pressure chronic ulcer of other part of right foot limited to breakdown of skin: Secondary | ICD-10-CM | POA: Diagnosis not present

## 2021-10-26 DIAGNOSIS — Z794 Long term (current) use of insulin: Secondary | ICD-10-CM | POA: Diagnosis not present

## 2021-10-26 LAB — COMPREHENSIVE METABOLIC PANEL
ALT: 31 U/L (ref 0–44)
AST: 31 U/L (ref 15–41)
Albumin: 3.9 g/dL (ref 3.5–5.0)
Alkaline Phosphatase: 74 U/L (ref 38–126)
Anion gap: 10 (ref 5–15)
BUN: 18 mg/dL (ref 8–23)
CO2: 25 mmol/L (ref 22–32)
Calcium: 8.8 mg/dL — ABNORMAL LOW (ref 8.9–10.3)
Chloride: 102 mmol/L (ref 98–111)
Creatinine, Ser: 1.24 mg/dL — ABNORMAL HIGH (ref 0.44–1.00)
GFR, Estimated: 48 mL/min — ABNORMAL LOW (ref >60)
Glucose, Bld: 158 mg/dL — ABNORMAL HIGH (ref 70–99)
Potassium: 3.9 mmol/L (ref 3.5–5.1)
Sodium: 137 mmol/L (ref 135–145)
Total Bilirubin: 0.2 mg/dL — ABNORMAL LOW (ref 0.3–1.2)
Total Protein: 7.6 g/dL (ref 6.5–8.1)

## 2021-10-26 LAB — CBC WITH DIFFERENTIAL/PLATELET
Abs Immature Granulocytes: 0.02 10*3/uL (ref 0.00–0.07)
Basophils Absolute: 0 10*3/uL (ref 0.0–0.1)
Basophils Relative: 0 %
Eosinophils Absolute: 0.1 10*3/uL (ref 0.0–0.5)
Eosinophils Relative: 2 %
HCT: 42.5 % (ref 36.0–46.0)
Hemoglobin: 14 g/dL (ref 12.0–15.0)
Immature Granulocytes: 0 %
Lymphocytes Relative: 13 %
Lymphs Abs: 0.9 10*3/uL (ref 0.7–4.0)
MCH: 27.8 pg (ref 26.0–34.0)
MCHC: 32.9 g/dL (ref 30.0–36.0)
MCV: 84.5 fL (ref 80.0–100.0)
Monocytes Absolute: 0.5 10*3/uL (ref 0.1–1.0)
Monocytes Relative: 7 %
Neutro Abs: 5.2 10*3/uL (ref 1.7–7.7)
Neutrophils Relative %: 78 %
Platelets: 278 10*3/uL (ref 150–400)
RBC: 5.03 MIL/uL (ref 3.87–5.11)
RDW: 14.6 % (ref 11.5–15.5)
WBC: 6.7 10*3/uL (ref 4.0–10.5)
nRBC: 0 % (ref 0.0–0.2)

## 2021-10-26 MED ORDER — PANTOPRAZOLE SODIUM 40 MG PO TBEC: 40.0000 mg | DELAYED_RELEASE_TABLET | Freq: Every day | ORAL | 3 refills | Status: DC

## 2021-10-26 MED ORDER — SUCRALFATE 1 GM/10ML PO SUSP: 1.0000 g | Freq: Four times a day (QID) | ORAL | 1 refills | Status: AC

## 2021-10-26 NOTE — Patient Instructions (Signed)
Your head CT

## 2021-10-26 NOTE — Progress Notes (Signed)
Established Patient Office Visit  Subjective:  Patient ID: Tara Levine, female    DOB: [birth year]  Age: 66 y.o. MRN: [id number]  CC:  Chief Complaint  Patient presents with   Follow-up    ER    HPI Tara Levine presents for ER follow up. Patient is here with her son.   Discharge Date: 10/20/21 Hospital/facility: [facility] Diagnosis: Nonspecific chest pain Procedures/tests: EKG normal sinus rhythm, chest x-ray with minimal bronchitic changes. CT A/P negative, US abdomen showing fatty liver. Troponin levels negative x 2. No other laboratory abnormalities. Treated with IVF, IV Pepcid, Zofran.  Consultants: None New medications: Carafate Discontinued medications: Zofran and Carafate  Status: stable  GERD:  -Currently on Omeprazole 40 mg daily but states it is not working and her nonspecific chest pain is from her uncontrolled acid reflux. She would like to see GI for this.  -ER started her on Carafate which does improve her symptoms -No chest pain, SOB today  Fall: -Patient tripped going into her bathroom yesterday, landed on her right shoulder and the right side of neck on the bathtub. Patient endorses she did hit her hit as well. Did not LOC.  -Complaining of severe pain in the right side of her neck with restricted ROM and pain in her right clavicle and shoulder with restricted range of motion.  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    CARPAL TUNNEL RELEASE     PARTIAL HYSTERECTOMY     age 2, no cancer    Family History  Problem Relation Age of Onset   Diabetes Mother    Hypertension Mother    Heart disease Mother    Prostate cancer Father    Heart disease Father    Diabetes Brother     Social History   Socioeconomic History   Marital status: Married    Spouse name: Not on file   Number of children: Not on file   Years of education: Not on file   Highest education level: Not on file  Occupational History   Not on file  Tobacco Use   Smoking status: Never   Smokeless tobacco: Never   Tobacco comments:    quit 1985  Vaping Use   Vaping Use: Never used  Substance and Sexual Activity   Alcohol use: No    Alcohol/week: 0.0 standard drinks of alcohol   Drug use: No   Sexual activity: Not Currently  Other Topics Concern   Not on file  Social History Narrative   Not on file   Social Determinants of Health  Financial Resource Strain: Not on file  Food Insecurity: Not on file  Transportation Needs: Not on file  Physical Activity: Not on file  Stress: Not on file  Social Connections: Not on file  Intimate Partner Violence: Not on file    ROS Review of Systems  Musculoskeletal:  Positive for neck pain.  Skin: Negative.     Objective:   Today's Vitals: BP 118/70   Pulse 69   Temp 97.7 F (36.5 C)   Resp 18   Ht '5\' 7"'$  (1.702 m)   Wt 275 lb 14.4 oz (125.1 kg)   SpO2 93%   BMI 43.21 kg/m   Physical Exam Constitutional:      Appearance: Normal appearance. She is obese.     Comments: Presents in wheelchair  HENT:     Head: Normocephalic and atraumatic.     Mouth/Throat:     Comments: PND present Neck:     Comments: Restricted  ROM but difficult to assess, pain throughout  Cardiovascular:     Rate and Rhythm: Normal rate and regular rhythm.  Pulmonary:     Effort: Pulmonary effort is normal.     Breath sounds: Normal breath sounds.  Musculoskeletal:     Right shoulder: Tenderness and bony tenderness present. No swelling or deformity. Decreased range of motion.     Cervical back: Tenderness present.     Right lower leg: No edema.     Left lower leg: No edema.  Skin:    General: Skin is warm and dry.  Neurological:     General: No focal deficit present.     Mental Status: She is alert. Mental status is at baseline.  Psychiatric:     Comments: Tearful     Assessment & Plan:   1. Fall, initial encounter/Traumatic injury of head, initial encounter/Neck pain/Acute pain of right shoulder/Pain in both wrists: Pain in head, neck, right shoulder, right clavicle and bilateral wrists after fall yesterday. Will obtain CT head/neck as well as x-rays of the neck, right shoulder and bilateral wrists. Patient already on oxycodone per pain management.   - DG Cervical Spine Complete; Future - DG Shoulder Right; Future - DG Wrist Complete Left; Future - DG Wrist Complete Right; Future - CT HEAD WO CONTRAST (5MM); Future - CT CERVICAL SPINE WO CONTRAST; Future  2. Gastroesophageal reflux disease, unspecified whether esophagitis present: Discontinue Omeprazole, switch to Pantoprazole. Refill Carafate. Referral to GI placed per patient request.   - sucralfate (CARAFATE) 1 GM/10ML suspension; Take 10 mLs (1 g total) by mouth 4 (four) times daily.  Dispense: 420 mL; Refill: 1 - pantoprazole (PROTONIX) 40 MG tablet; Take 1 tablet (40 mg total) by mouth daily.  Dispense: 30 tablet; Refill: 3 - Ambulatory referral to Gastroenterology  3. Stage 3a chronic kidney disease [facility]): Reviewed labs in ER. Referral to Nephrology per patient request.   - Ambulatory referral to Nephrology  Follow-up: Return for already scheduled .    Teodora Medici, DO

## 2021-10-30 ENCOUNTER — Telehealth: Payer: Self-pay

## 2021-10-30 NOTE — Patient Outreach (Signed)
  Care Coordination   10/30/2021 Name: Tara Levine MRN: [id number] DOB: [birth year]   Care Coordination Outreach Attempts:  An unsuccessful telephone outreach was attempted today to offer the patient information about available care coordination services as a benefit of their health plan.   Follow Up Plan:  Additional outreach attempts will be made to offer the patient care coordination information and services.   Encounter Outcome:  No Answer  Care Coordination Interventions Activated:  No   Care Coordination Interventions:  No, not indicated    Jone Baseman, RN, MSN [facility] Care Management Care Management Coordinator Direct Line [phone] Toll Free: [phone]  Fax: [phone]

## 2021-10-31 LAB — BCR-ABL1 FISH
Cells Analyzed: 200
Cells Counted: 200

## 2021-11-01 ENCOUNTER — Telehealth: Payer: Self-pay

## 2021-11-01 DIAGNOSIS — E11621 Type 2 diabetes mellitus with foot ulcer: Secondary | ICD-10-CM | POA: Diagnosis not present

## 2021-11-01 NOTE — Chronic Care Management (AMB) (Signed)
  Chronic Care Management   Note  11/01/2021 Name: Ariane Ditullio MRN: [id number] DOB: [birth year]  Analaura Cush is a 66 y.o. year old female who is a primary care patient of Teodora Medici, DO. I reached out to [employer] by phone today in response to a referral sent by Ms. Shavawn Eisenstein's PCP.  Ms. Schrodt was given information about Chronic Care Management services today including:  CCM service includes personalized support from designated clinical staff supervised by her physician, including individualized plan of care and coordination with other care providers 24/7 contact phone numbers for assistance for urgent and routine care needs. Service will only be billed when office clinical staff spend 20 minutes or more in a month to coordinate care. Only one practitioner may furnish and bill the service in a calendar month. The patient may stop CCM services at any time (effective at the end of the month) by phone call to the office staff. The patient is responsible for co-pay (up to 20% after annual deductible is met) if co-pay is required by the individual health plan.   Patient did not agree to enrollment in care management services and does not wish to consider at this time.  Follow up plan: Patient declines further follow up and engagement by the care management team. Appropriate care team members and provider have been notified via electronic communication.   Noreene Larsson, [place], [place] [address] Direct Dial: [phone] [email or web address]

## 2021-11-02 ENCOUNTER — Ambulatory Visit: Payer: Medicare HMO | Admitting: Internal Medicine

## 2021-11-02 ENCOUNTER — Other Ambulatory Visit: Payer: Self-pay | Admitting: Internal Medicine

## 2021-11-02 DIAGNOSIS — B3731 Acute candidiasis of vulva and vagina: Secondary | ICD-10-CM

## 2021-11-02 DIAGNOSIS — M5412 Radiculopathy, cervical region: Secondary | ICD-10-CM | POA: Diagnosis not present

## 2021-11-02 DIAGNOSIS — G8929 Other chronic pain: Secondary | ICD-10-CM | POA: Diagnosis not present

## 2021-11-02 DIAGNOSIS — M7918 Myalgia, other site: Secondary | ICD-10-CM | POA: Diagnosis not present

## 2021-11-02 DIAGNOSIS — K219 Gastro-esophageal reflux disease without esophagitis: Secondary | ICD-10-CM

## 2021-11-02 DIAGNOSIS — H26492 Other secondary cataract, left eye: Secondary | ICD-10-CM | POA: Diagnosis not present

## 2021-11-02 DIAGNOSIS — M545 Low back pain, unspecified: Secondary | ICD-10-CM | POA: Diagnosis not present

## 2021-11-05 ENCOUNTER — Inpatient Hospital Stay (HOSPITAL_BASED_OUTPATIENT_CLINIC_OR_DEPARTMENT_OTHER): Payer: Medicare HMO | Admitting: Oncology

## 2021-11-05 ENCOUNTER — Encounter: Payer: Self-pay | Admitting: Oncology

## 2021-11-05 DIAGNOSIS — C9210 Chronic myeloid leukemia, BCR/ABL-positive, not having achieved remission: Secondary | ICD-10-CM | POA: Diagnosis not present

## 2021-11-05 DIAGNOSIS — N1831 Chronic kidney disease, stage 3a: Secondary | ICD-10-CM | POA: Diagnosis not present

## 2021-11-05 DIAGNOSIS — R319 Hematuria, unspecified: Secondary | ICD-10-CM | POA: Diagnosis not present

## 2021-11-05 DIAGNOSIS — Z5111 Encounter for antineoplastic chemotherapy: Secondary | ICD-10-CM

## 2021-11-05 MED ORDER — BOSUTINIB 100 MG PO TABS: 300.0000 mg | ORAL_TABLET | Freq: Every day | ORAL | 1 refills | Status: DC

## 2021-11-05 NOTE — Progress Notes (Signed)
Patient called/ pre- screened for virtual appoinment today with oncologist. Concerns of bloody urine, constipation and fatigue

## 2021-11-05 NOTE — Progress Notes (Signed)
HEMATOLOGY-ONCOLOGY TeleHEALTH VISIT PROGRESS NOTE  I connected with [name] on 11/05/21  at  2:45 PM EDT by video enabled telemedicine visit and verified that I am speaking with the correct person using two identifiers. I discussed the limitations, risks, security and privacy concerns of performing an evaluation and management service by telemedicine and the availability of in-person appointments. The patient expressed understanding and agreed to proceed.   Other persons participating in the visit and their role in the encounter:  None  Patient's location: Home  Provider's location: office Chief Complaint: Tara Levine is a 66 y.o. female who has above history reviewed by me today presents for follow up visit for management of CML Patients requests virtual visits.  She reports not feeling well, chronic flank pain, no fever. + blood in the urine. She feels she has "kidney stone".  Recent CT scan in August did not showed kidney stone or explain her symptoms.  She takes Bosutinib '400mg'$  daily.  She reports difficulty in ambulating. Not able to come for in person follow up.  She has multiple medical problems and is in the process of switching to a different PCP  I discussed the limitations of evaluation and management by telemedicine. The patient expressed understanding and agreed to proceed.  Review of Systems  Constitutional:  Positive for fatigue. Negative for chills and fever.  HENT:   Negative for hearing loss and voice change.   Eyes:  Negative for eye problems.  Respiratory:  Positive for shortness of breath. Negative for chest tightness and cough.   Cardiovascular:  Negative for chest pain.  Gastrointestinal:  Negative for abdominal distention, abdominal pain and blood in stool.  Endocrine: Negative for hot flashes.  Genitourinary:  Positive for hematuria. Negative for difficulty urinating and frequency.   Musculoskeletal:  Positive for flank pain.  Negative for arthralgias.  Skin:  Negative for itching and rash.  Neurological:  Negative for extremity weakness.  Hematological:  Negative for adenopathy.  Psychiatric/Behavioral:  Negative for confusion. The patient is not nervous/anxious.     Past Medical History:  Diagnosis Date   CML (chronic myelocytic leukemia) ([place])    Depression    DM type 2 (diabetes mellitus, type 2) ([place])    Environmental allergies    History of gastric ulcer    HLD (hyperlipidemia)    IDA (iron deficiency anemia)    chronic   Leukemia (HCC)    Leukemia, chronic myeloid (HCC)    Osteoarthritis    Panic disorder    PSS (progressive systemic sclerosis) ([place])    Unspecified essential hypertension    Past Surgical History:  Procedure Laterality Date   back and neck surgery  1999, 2009   had rods placed   [facility]) No blackages found.    CARPAL TUNNEL RELEASE     PARTIAL HYSTERECTOMY     age 53, no cancer    Family History  Problem Relation Age of Onset   Diabetes Mother    Hypertension Mother    Heart disease Mother    Prostate cancer Father    Heart disease Father    Diabetes Brother     Social History   Socioeconomic History   Marital status: Married    Spouse name: Not on file   Number of children: Not on file   Years of education: Not on file   Highest education level: Not on file  Occupational History  Not on file  Tobacco Use   Smoking status: Never   Smokeless tobacco: Never   Tobacco comments:    quit 1985  Vaping Use   Vaping Use: Never used  Substance and Sexual Activity   Alcohol use: No    Alcohol/week: 0.0 standard drinks of alcohol   Drug use: No   Sexual activity: Not Currently  Other Topics Concern   Not on file  Social History Narrative   Not on file   Social Determinants of Health   Financial Resource Strain: Not on file  Food Insecurity: Not on file  Transportation Needs: Not on file  Physical  Activity: Not on file  Stress: Not on file  Social Connections: Not on file  Intimate Partner Violence: Not on file    Current Outpatient Medications on File Prior to Visit  Medication Sig Dispense Refill   ACCU-CHEK GUIDE test strip TEST ONCE DAILY AND AS DIRECTED. 100 each 3   Accu-Chek Softclix Lancets lancets Use 1 each 3 (three) times daily. Use as instructed. 100 each 2   albuterol (PROVENTIL HFA;VENTOLIN HFA) 108 (90 BASE) MCG/ACT inhaler Inhale 2 puffs into the lungs every 6 (six) hours as needed for wheezing or shortness of breath.     albuterol (PROVENTIL) (2.5 MG/3ML) 0.083% nebulizer solution INHALE CONTENTS OF 1 VIAL VIA NEBULIZER THREE TIMES A DAY prn     Alcohol Swabs PADS To be used three times a day to check blood sugar. 100 each 2   ALPRAZolam (XANAX) 1 MG tablet Take 1 tablet (1 mg total) by mouth 3 (three) times daily. 90 tablet 5   Ascorbic Acid (VITAMIN C) 1000 MG tablet Take 3,000 mg by mouth daily.     atorvastatin (LIPITOR) 20 MG tablet Take 1 tablet (20 mg total) by mouth daily. 90 tablet 3   baclofen (LIORESAL) 10 MG tablet Take 10 mg by mouth 2 (two) times daily. 1/2 tab     Blood Glucose Monitoring Suppl (GLUCOCOM BLOOD GLUCOSE MONITOR) DEVI Accu -chek 1 each 0   celecoxib (CELEBREX) 100 MG capsule      cetirizine (ZYRTEC) 10 MG tablet Take 20 mg by mouth daily as needed.      Cholecalciferol (VITAMIN D PO) Take 1 tablet by mouth daily.     co-enzyme Q-10 50 MG capsule Take 50 mg by mouth daily.     Continuous Blood Gluc Receiver ([place]) DEVI 1 each by Does not apply route every 14 (fourteen) days. 1 each 3   Continuous Blood Gluc Sensor (DEXCOM G7 SENSOR) MISC 1 each by Does not apply route every 14 (fourteen) days. 12 each 1   diphenhydrAMINE (BENADRYL) 25 MG tablet Take 25 mg by mouth every 6 (six) hours as needed for itching or allergies.      DULoxetine (CYMBALTA) 20 MG capsule Take 1 capsule (20 mg total) by mouth in the morning, at noon, in the  evening, and at bedtime. 120 capsule 5   empagliflozin (JARDIANCE) 25 MG TABS tablet Take 1 tablet (25 mg total) by mouth daily before breakfast. 30 tablet 3   fluticasone (FLONASE) 50 MCG/ACT nasal spray Place 1-2 sprays into both nostrils as needed for rhinitis.      furosemide (LASIX) 40 MG tablet Take 1 tablet (40 mg total) by mouth daily. 30 tablet 3   HYDROmorphone (DILAUDID) 4 MG tablet Take 4 mg by mouth 3 (three) times daily as needed.     insulin aspart (NOVOLOG) 100 UNIT/ML injection  Inject 10 Units into the skin 3 (three) times daily as needed for high blood sugar. Pt reports she is taking 2-8 units per sliding scale based on glucose     NOVOLOG FLEXPEN 100 UNIT/ML FlexPen Inject into the skin.     nystatin (MYCOSTATIN/NYSTOP) powder Apply 1 Application topically 3 (three) times daily. 60 g 2   nystatin cream (MYCOSTATIN) Apply 1 Application topically 2 (two) times daily. 30 g 0   Omega-3 Fatty Acids (OMEGA 3 PO) Take 3 capsules by mouth daily.     ondansetron (ZOFRAN-ODT) 4 MG disintegrating tablet Take 1 tablet (4 mg total) by mouth every 8 (eight) hours as needed for nausea or vomiting. 20 tablet 0   pantoprazole (PROTONIX) 40 MG tablet Take 1 tablet (40 mg total) by mouth daily. 30 tablet 3   polyethylene glycol (MIRALAX MIX-IN PAX) 17 g packet Take 17 g by mouth daily as needed. 14 each 0   prednisoLONE acetate (PRED FORTE) 1 % ophthalmic suspension SMARTSIG:In Eye(s)     pregabalin (LYRICA) 300 MG capsule Take 1 capsule (300 mg total) by mouth 2 (two) times daily. 60 capsule 5   Semaglutide,0.25 or 0.'5MG'$ /DOS, (OZEMPIC, 0.25 OR 0.5 MG/DOSE,) 2 MG/3ML SOPN Inject 0.5 mg into the skin once a week. 3 mL 0   sucralfate (CARAFATE) 1 GM/10ML suspension Take 10 mLs (1 g total) by mouth 4 (four) times daily. 420 mL 1   vitamin B-12 (CYANOCOBALAMIN) 1000 MCG tablet Take 4,000 mcg by mouth daily.     amLODipine (NORVASC) 5 MG tablet Take 1 tablet (5 mg total) by mouth daily. 90 tablet 0    insulin glargine (LANTUS SOLOSTAR) 100 UNIT/ML Solostar Pen Inject 25 Units into the skin daily. 15 mL PRN   Oxycodone HCl 10 MG TABS Take 10 mg by mouth as needed. (Patient not taking: Reported on 11/05/2021)     No current facility-administered medications on file prior to visit.    Allergies  Allergen Reactions   Morphine Rash    Headache too   Codeine Other (See Comments)    Reaction: Unknown    Latex Other (See Comments)    Reaction:  Unknown    Morphine And Related Other (See Comments)    Reaction:  Unknown    Relafen [Nabumetone] Hives   Nsaids Rash   Tape Other (See Comments) and Rash    Reaction:  Unknown        Observations/Objective: Today's Vitals   11/05/21 1437  PainSc: 6     There is no height or weight on file to calculate BMI.  Physical Exam Neurological:     Mental Status: She is alert.     LABS    Latest Ref Rng & Units 10/26/2021   12:17 PM 10/20/2021    7:25 PM 08/03/2021    1:49 PM  CBC  WBC 4.0 - 10.5 K/uL 6.7  10.9  6.5   Hemoglobin 12.0 - 15.0 g/dL 14.0  15.3  13.0   Hematocrit 36.0 - 46.0 % 42.5  47.7  39.9   Platelets 150 - 400 K/uL 278  325  264       Latest Ref Rng & Units 10/26/2021   12:17 PM 10/20/2021    7:29 PM 10/20/2021    7:25 PM  CMP  Glucose 70 - 99 mg/dL 158   172   BUN 8 - 23 mg/dL 18   26   Creatinine 0.44 - 1.00 mg/dL 1.24   1.31  Sodium 135 - 145 mmol/L 137   138   Potassium 3.5 - 5.1 mmol/L 3.9   3.7   Chloride 98 - 111 mmol/L 102   99   CO2 22 - 32 mmol/L 25   27   Calcium 8.9 - 10.3 mg/dL 8.8   10.1   Total Protein 6.5 - 8.1 g/dL 7.6  8.6    Total Bilirubin 0.3 - 1.2 mg/dL 0.2  0.8    Alkaline Phos 38 - 126 U/L 74  82    AST 15 - 41 U/L 31  31    ALT 0 - 44 U/L 31  33      Assessment and Plan: 1. CML (chronic myelocytic leukemia) (HCC)   2. Stage 3a chronic kidney disease ([place])   3. Encounter for antineoplastic chemotherapy   4. Hematuria, unspecified type     #CML, chronic phase, recurrent.-Restarted on  bosutinib 12/27/2020 BCR ABL 1 FISH is positive for 0.33% of nuclei, b3 a2 transcript 1.1677%. 02/20/2021, BCR ABL 1 FISH is negative.b3 a2 transcript 0.27%. 08/03/2021, BCR-ABL1 FISH is negative,b3 a2 transcript 0.0521%. 10/26/2021 BCR-ABL1 FISH is negative,b3 a2 transcript pending Recommend patient to continue bosutinib, decrease dose to '300mg'$  daily due to renal impairment.    Refill prescription has been sent to pharmacy.  Intermittent Hematuria, refer to urology for evaluation.  Recommend patient to have close follow-up with primary care provider for her other medical problems.   Follow Up Instructions:  Lab MD in 2 months.  Patient prefers virtual visit   I discussed the assessment and treatment plan with the patient. The patient was provided an opportunity to ask questions and all were answered. The patient agreed with the plan and demonstrated an understanding of the instructions.  The patient was advised to call back or seek an in-person evaluation if the symptoms worsen or if the condition fails to improve as anticipated.   Earlie Server, MD 11/05/2021 9:02 PM

## 2021-11-06 ENCOUNTER — Other Ambulatory Visit: Payer: Self-pay | Admitting: Internal Medicine

## 2021-11-06 ENCOUNTER — Telehealth: Payer: Self-pay

## 2021-11-06 DIAGNOSIS — Z794 Long term (current) use of insulin: Secondary | ICD-10-CM

## 2021-11-06 LAB — BCR-ABL1, CML/ALL, PCR, QUANT: b3a2 transcript: 0.0458 %

## 2021-11-06 NOTE — Patient Outreach (Signed)
  Care Coordination   11/06/2021 Name: Tara Levine MRN: [id number] DOB: 1956-01-20   Care Coordination Outreach Attempts:  A third unsuccessful outreach was attempted today to offer the patient with information about available care coordination services as a benefit of their health plan.   Follow Up Plan:  No further outreach attempts will be made at this time. We have been unable to contact the patient to offer or enroll patient in care coordination services  Encounter Outcome:  No Answer  Care Coordination Interventions Activated:  No   Care Coordination Interventions:  No, not indicated    Jone Baseman, RN, MSN [place] [facility] Care Management Coordinator Direct Line [phone] Toll Free: [phone]  Fax: [phone]

## 2021-11-06 NOTE — Telephone Encounter (Signed)
Refilled 10/18/2021 confirmed by same pharmacy. Requested Prescriptions  Pending Prescriptions Disp Refills  . fluconazole (DIFLUCAN) 150 MG tablet [Pharmacy Med Name: FLUCONAZOLE 150 MG TABLET] 4 tablet 0    Sig: TAKE 1 TABLET (150 MG TOTAL) BY MOUTH ONCE A WEEK. FOR FOUR WEEKS.     Off-Protocol Failed - 11/02/2021  2:45 PM      Failed - Medication not assigned to a protocol, review manually.      Passed - Valid encounter within last 12 months    Recent Outpatient Visits          1 week ago Fall, initial encounter   [facility] Teodora Medici, DO   2 weeks ago Uncontrolled type 2 diabetes mellitus with hyperglycemia, with long-term current use of insulin [facility])   [facility] Teodora Medici, DO   1 month ago Type 2 diabetes mellitus without complication, with long-term current use of insulin [facility])   [facility] Teodora Medici, DO   2 months ago Type 2 diabetes mellitus without complication, with long-term current use of insulin [facility])   [facility] Teodora Medici, DO   2 months ago Type 2 diabetes mellitus without complication, with long-term current use of insulin [facility])   [place], DO      Future Appointments            In 1 month Teodora Medici, DO [facility], [place]   In 2 months Earlie Server, MD [facility] [place]  . pantoprazole (PROTONIX) 40 MG tablet [Pharmacy Med Name: PANTOPRAZOLE SOD DR 40 MG TAB] 90 tablet 1    Sig: TAKE 1 TABLET BY MOUTH EVERY DAY     Gastroenterology: Proton Pump Inhibitors Passed - 11/02/2021  2:45 PM      Passed - Valid encounter within last 12 months    Recent Outpatient Visits          1 week ago Fall, initial encounter   [facility] Teodora Medici, DO   2 weeks ago Uncontrolled type 2 diabetes  mellitus with hyperglycemia, with long-term current use of insulin [facility])   [facility] Teodora Medici, DO   1 month ago Type 2 diabetes mellitus without complication, with long-term current use of insulin [facility])   [facility] Teodora Medici, DO   2 months ago Type 2 diabetes mellitus without complication, with long-term current use of insulin [facility])   [facility] Teodora Medic[facility], DO   2 months ago Type 2 diabetes mellitus without complication, with long-term current use of insulin [facility])   [facility] Myles Gip, DO      Future Appointments            In 1 month Teodora Medici, [facility], [place]   In 2 months Earlie Server, MD [facility] at [place]

## 2021-11-06 NOTE — Telephone Encounter (Signed)
Refilled 10/26/2021 #30 3 refills. Requested Prescriptions  Pending Prescriptions Disp Refills  . fluconazole (DIFLUCAN) 150 MG tablet [Pharmacy Med Name: FLUCONAZOLE 150 MG TABLET] 4 tablet 0    Sig: TAKE 1 TABLET (150 MG TOTAL) BY MOUTH ONCE A WEEK. FOR FOUR WEEKS.     Off-Protocol Failed - 11/02/2021  2:45 PM      Failed - Medication not assigned to a protocol, review manually.      Passed - Valid encounter within last 12 months    Recent Outpatient Visits          1 week ago Fall, initial encounter   [facility] Teodora Medici, DO   2 weeks ago Uncontrolled type 2 diabetes mellitus with hyperglycemia, with long-term current use of insulin [facility])   [facility] Teodora Medici, DO   1 month ago Type 2 diabetes mellitus without complication, with long-term current use of insulin [facility])   [facility] Teodora Medic[facility], DO   2 months ago Type 2 diabetes mellitus without complication, with long-term current use of insulin [facility])   [facility] Teodora Medici, DO   2 months ago Type 2 diabetes mellitus without complication, with long-term current use of insulin [facility])   [place], DO      Future Appointments            In 1 month Teodora Medici, DO [facility], [place]   In 2 months Earlie Server, MD [facility] [place]           . pantoprazole (PROTONIX) 40 MG tablet [Pharmacy Med Name: PANTOPRAZOLE SOD DR 40 MG TAB] 90 tablet 1    Sig: TAKE 1 TABLET BY MOUTH EVERY DAY     Gastroenterology: Proton Pump Inhibitors Passed - 11/02/2021  2:45 PM      Passed - Valid encounter within last 12 months    Recent Outpatient Visits          1 week ago Fall, initial encounter   [facility] Teodora Medici, DO   2 weeks ago Uncontrolled type 2 diabetes mellitus with hyperglycemia, with long-term current use  of insulin [facility])   [facility] Teodora Medici, DO   1 month ago Type 2 diabetes mellitus without complication, with long-term current use of insulin [facility])   [facility] Teodora Medici, DO   2 months ago Type 2 diabetes mellitus without complication, with long-term current use of insulin [facility])   [facility] Teod[facility] Medic[facility], DO   2 months ago Type 2 diabetes mellitus without complication, with long-term current use of insulin [facility])   [facility] Myles Gip, DO      Future Appointments            In 1 month Teodora Medici, [facility], [place]   In 2 months Earlie Server, MD [facility] at [place]

## 2021-11-07 ENCOUNTER — Telehealth: Payer: Self-pay | Admitting: Internal Medicine

## 2021-11-07 NOTE — Telephone Encounter (Signed)
Copied from [place] [phone]. Topic: Appointment Scheduling - Scheduling Inquiry for Clinic >> Nov 07, 2021  3:03 PM Erskine Squibb wrote: Reason for CRM: Lenna Sciara from [facility] called in stating the patient insurance requires a prior authorization. She is having a CT of her head and cervical spine on Friday 9/13. Please assist further.

## 2021-11-08 NOTE — Telephone Encounter (Signed)
Requested Prescriptions  Pending Prescriptions Disp Refills  . OZEMPIC, 0.25 OR 0.5 MG/DOSE, 2 MG/3ML SOPN [Pharmacy Med Name: OZEMPIC 0.25-0.5 MG/DOSE PEN] 3 mL 0    Sig: INJECT 0.5 MG INTO THE SKIN ONCE A WEEK.     Endocrinology:  Diabetes - GLP-1 Receptor Agonists - semaglutide Failed - 11/06/2021  8:20 PM      Failed - HBA1C in normal range and within 180 days    Hemoglobin A1C  Date Value Ref Range Status  08/31/2021 8.1 (A) 4.0 - 5.6 % Final   Hgb A1c MFr Bld  Date Value Ref Range Status  05/22/2021 8.4 (H) <5.7 % of total Hgb Final    Comment:    For someone without known diabetes, a hemoglobin A1c value of 6.5% or greater indicates that they may have  diabetes and this should be confirmed with a follow-up  test. . For someone with known diabetes, a value <7% indicates  that their diabetes is well controlled and a value  greater than or equal to 7% indicates suboptimal  control. A1c targets should be individualized based on  duration of diabetes, age, comorbid conditions, and  other considerations. . Currently, no consensus exists regarding use of hemoglobin A1c for diagnosis of diabetes for children. .          Failed - Cr in normal range and within 360 days    Creat  Date Value Ref Range Status  05/22/2021 0.99 0.50 - 1.05 mg/dL Final   Creatinine, Ser  Date Value Ref Range Status  10/26/2021 1.24 (H) 0.44 - 1.00 mg/dL Final   Creatinine, Urine  Date Value Ref Range Status  05/22/2021 148 20 - 275 mg/dL Final         Passed - Valid encounter within last 6 months    Recent Outpatient Visits          1 week ago Fall, initial encounter   [facility] Teodora Medici, DO   3 weeks ago Uncontrolled type 2 diabetes mellitus with hyperglycemia, with long-term current use of insulin [facility])   [facility] Teodora Medici, DO   1 month ago Type 2 diabetes mellitus without complication, with long-term current use of  insulin [name])   [facility] Teod[facility] Medic[facility], DO   2 months ago Type 2 diabetes mellitus without complication, with long-term current use of insulin [facility])   [facility] Teod[facility] Medic[facility], DO   2 months ago Type 2 diabetes mellitus without complication, with long-term current use of insulin [facility])   [facility] Myles Gip, DO      Future Appointments            In 4 weeks Teodora Medici, DO [facility], [place]   In 2 months Earlie Server, MD [facility] at [place]

## 2021-11-09 ENCOUNTER — Ambulatory Visit: Payer: Medicare HMO

## 2021-11-17 ENCOUNTER — Other Ambulatory Visit: Payer: Self-pay | Admitting: Internal Medicine

## 2021-11-17 DIAGNOSIS — E1165 Type 2 diabetes mellitus with hyperglycemia: Secondary | ICD-10-CM

## 2021-11-19 ENCOUNTER — Telehealth: Payer: Self-pay | Admitting: *Deleted

## 2021-11-19 NOTE — Telephone Encounter (Signed)
Pt calling upset. States son picked up Ozempic and dose had been lowered to 0.'5mg'$   from '1mg'$ . States not discussed, does not think this should be lowered. States on '1mg'$  x 3 months and doing very well with BS, diet. Assured pt NT would route to practice for PCPs review.  Please advise: [phone]

## 2021-11-19 NOTE — Telephone Encounter (Signed)
Requested medications are due for refill today.  unsure  Requested medications are on the active medications list.  yes  Last refill. 11/08/2021  Future visit scheduled.   yes  Notes to clinic.  Medication refilled 11/08/2021 and medication was d/c'd 10/02/2021    Requested Prescriptions  Pending Prescriptions Disp Refills   OZEMPIC, 1 MG/DOSE, 4 MG/3ML SOPN [Pharmacy Med Name: OZEMPIC 4 MG/3 ML (1 MG/DOSE)]  2    Sig: INJECT 1 MG ONCE A WEEK AS DIRECTED     Endocrinology:  Diabetes - GLP-1 Receptor Agonists - semaglutide Failed - 11/17/2021  9:31 AM      Failed - HBA1C in normal range and within 180 days    Hemoglobin A1C  Date Value Ref Range Status  08/31/2021 8.1 (A) 4.0 - 5.6 % Final   Hgb A1c MFr Bld  Date Value Ref Range Status  05/22/2021 8.4 (H) <5.7 % of total Hgb Final    Comment:    For someone without known diabetes, a hemoglobin A1c value of 6.5% or greater indicates that they may have  diabetes and this should be confirmed with a follow-up  test. . For someone with known diabetes, a value <7% indicates  that their diabetes is well controlled and a value  greater than or equal to 7% indicates suboptimal  control. A1c targets should be individualized based on  duration of diabetes, age, comorbid conditions, and  other considerations. . Currently, no consensus exists regarding use of hemoglobin A1c for diagnosis of diabetes for children. .          Failed - Cr in normal range and within 360 days    Creat  Date Value Ref Range Status  05/22/2021 0.99 0.50 - 1.05 mg/dL Final   Creatinine, Ser  Date Value Ref Range Status  10/26/2021 1.24 (H) 0.44 - 1.00 mg/dL Final   Creatinine, Urine  Date Value Ref Range Status  05/22/2021 148 20 - 275 mg/dL Final         Passed - Valid encounter within last 6 months    Recent Outpatient Visits           3 weeks ago Fall, initial encounter   [facility] Teodora Medici, DO   1 month ago  Uncontrolled type 2 diabetes mellitus with hyperglycemia, with long-term current use of insulin [facility])   [facility] Teodora Medici, DO   1 month ago Type 2 diabetes mellitus without complication, with long-term current use of insulin [facility])   [facility] Te[facility] Medic[facility], DO   2 months ago Type 2 diabetes mellitus without complication, with long-term current use of insulin [facility])   [facility] Te[facility], DO   3 months ago Type 2 diabetes mellitus without complication, with long-term current use of insulin [facility])   [facility] Myles Gip, DO       Future Appointments             In 4 days Hollice Espy, MD [place]   In 2 weeks Teodora Medici, DO [facility], [place]   In 1 month Earlie Server, MD [facility] at [place]

## 2021-11-20 MED ORDER — SEMAGLUTIDE (1 MG/DOSE) 4 MG/3ML ~~LOC~~ SOPN: 1.0000 mg | PEN_INJECTOR | SUBCUTANEOUS | 0 refills | Status: DC

## 2021-11-22 ENCOUNTER — Telehealth: Payer: Self-pay | Admitting: Internal Medicine

## 2021-11-22 ENCOUNTER — Encounter: Payer: Self-pay | Admitting: *Deleted

## 2021-11-22 ENCOUNTER — Other Ambulatory Visit: Payer: Self-pay | Admitting: *Deleted

## 2021-11-22 DIAGNOSIS — Z794 Long term (current) use of insulin: Secondary | ICD-10-CM

## 2021-11-22 DIAGNOSIS — R319 Hematuria, unspecified: Secondary | ICD-10-CM

## 2021-11-22 NOTE — Telephone Encounter (Signed)
This encounter was created in error - please disregard.

## 2021-11-22 NOTE — Telephone Encounter (Signed)
Requested Prescriptions  Pending Prescriptions Disp Refills  . OZEMPIC, 0.25 OR 0.5 MG/DOSE, 2 MG/3ML SOPN [Pharmacy Med Name: OZEMPIC 0.25-0.5 MG/DOSE PEN]      Sig: INJECT 0.5 MG INTO THE SKIN ONCE A WEEK.     Endocrinology:  Diabetes - GLP-1 Receptor Agonists - semaglutide Failed - 11/22/2021  2:12 AM      Failed - HBA1C in normal range and within 180 days    Hemoglobin A1C  Date Value Ref Range Status  08/31/2021 8.1 (A) 4.0 - 5.6 % Final   Hgb A1c MFr Bld  Date Value Ref Range Status  05/22/2021 8.4 (H) <5.7 % of total Hgb Final    Comment:    For someone without known diabetes, a hemoglobin A1c value of 6.5% or greater indicates that they may have  diabetes and this should be confirmed with a follow-up  test. . For someone with known diabetes, a value <7% indicates  that their diabetes is well controlled and a value  greater than or equal to 7% indicates suboptimal  control. A1c targets should be individualized based on  duration of diabetes, age, comorbid conditions, and  other considerations. . Currently, no consensus exists regarding use of hemoglobin A1c for diagnosis of diabetes for children. .          Failed - Cr in normal range and within 360 days    Creat  Date Value Ref Range Status  05/22/2021 0.99 0.50 - 1.05 mg/dL Final   Creatinine, Ser  Date Value Ref Range Status  10/26/2021 1.24 (H) 0.44 - 1.00 mg/dL Final   Creatinine, Urine  Date Value Ref Range Status  05/22/2021 148 20 - 275 mg/dL Final         Passed - Valid encounter within last 6 months    Recent Outpatient Visits          3 weeks ago Fall, initial encounter   [facility] Teodora Medici, DO   1 month ago Uncontrolled type 2 diabetes mellitus with hyperglycemia, with long-term current use of insulin [facility])   [facility] Teodora Medici, DO   1 month ago Type 2 diabetes mellitus without complication, with long-term current use of insulin  [facility])   [facility] Teodora Medici, DO   2 months ago Type 2 diabetes mellitus without complication, with long-term current use of insulin [facility])   [facility] Teodora Medici, DO   3 months ago Type 2 diabetes mellitus without complication, with long-term current use of insulin [facility])   [facility], Jake Church, DO      Future Appointments            Tomorrow Hollice Espy, [place]   In 2 weeks Teodora Medici, DO [facility], [place]   In 1 month Earlie Server, MD [facility] [place]

## 2021-11-22 NOTE — Telephone Encounter (Signed)
Patient called to request why Ozempic 0.5 mg refused for refill. Reviewed with patient current dose is 1 mg weekly. Last request for refill [facility] on 11/20/21 at 1:33 pm for 1 mg dose. Requested patient to contact pharmacy and if any issues or further questions regarding refills please contact office. Patient verbalized understanding.

## 2021-11-23 ENCOUNTER — Ambulatory Visit: Payer: Medicare HMO

## 2021-11-23 ENCOUNTER — Ambulatory Visit: Payer: Medicare HMO | Admitting: Urology

## 2021-11-26 ENCOUNTER — Other Ambulatory Visit: Payer: Self-pay | Admitting: Internal Medicine

## 2021-11-26 DIAGNOSIS — B3731 Acute candidiasis of vulva and vagina: Secondary | ICD-10-CM

## 2021-11-26 DIAGNOSIS — K219 Gastro-esophageal reflux disease without esophagitis: Secondary | ICD-10-CM

## 2021-11-27 NOTE — Telephone Encounter (Signed)
Requested medication (s) are due for refill today:   Diflucan provider to review;  Protonix requested too soon  Requested medication (s) are on the active medication list:   Yes for Protonix   Future visit scheduled:   Yes with [name] in 1 wk.   Last ordered: Diflucan not on medication list.  Last ordered 10/18/2021 and does not have a protocol assigned to it;   Protonix 10/26/2021 #30, 3 refills, being requested too soon.     Requested Prescriptions  Pending Prescriptions Disp Refills   fluconazole (DIFLUCAN) 150 MG tablet [Pharmacy Med Name: FLUCONAZOLE 150 MG TABLET] 4 tablet 0    Sig: Take 1 tablet (150 mg total) by mouth once a week. For four weeks.     Off-Protocol Failed - 11/26/2021 11:27 AM      Failed - Medication not assigned to a protocol, review manually.      Passed - Valid encounter within last 12 months    Recent Outpatient Visits           1 month ago Fall, initial encounter   [facility] Tara Medici, DO   1 month ago Uncontrolled type 2 diabetes mellitus with hyperglycemia, with long-term current use of insulin [facility])   [facility] Tara Medici, DO   1 month ago Type 2 diabetes mellitus without complication, with long-term current use of insulin [facility])   [facility] Tara Medici, DO   2 months ago Type 2 diabetes mellitus without complication, with long-term current use of insulin [facility])   [facility] Tara Medici, DO   3 months ago Type 2 diabetes mellitus without complication, with long-term current use of insulin [facility])   [facility] Tara Gip, DO       Future Appointments             In 3 days Tara Espy, MD [facility] [place]   In 1 week Tara Levine, [facility], [place]   In 1 month Tara Server, MD [facility] [place]             pantoprazole ([place]) 40  MG tablet [Pharmacy Med Name: PANTOPRAZOLE SOD DR 40 MG TAB] 90 tablet 1    Sig: TAKE 1 [place]     Gastroenterology: Proton Pump Inhibitors Passed - 11/26/2021 11:27 AM      Passed - Valid encounter within last 12 months    Recent Outpatient Visits           1 month ago Fall, initial encounter   [facility] Tara Medici, DO   1 month ago Uncontrolled type 2 diabetes mellitus with hyperglycemia, with long-term current use of insulin [facility])   [facility] Tara Medici, DO   1 month ago Type 2 diabetes mellitus without complication, with long-term current use of insulin [facility])   [facility] Tara Medici, DO   2 months ago Type 2 diabetes mellitus without complication, with long-term current use of insulin [facility])   [facility] Tara Medici, DO   3 months ago Type 2 diabetes mellitus without complication, with long-term current use of insulin [facility])   [facility] Tara Gip, DO       Future Appointments             In 3 days Tara Espy, MD [facility]  [place]   In 1 week Tara Levine, [facility] [place]   In 1 month Tara Server, MD [facility] [place]

## 2021-11-29 ENCOUNTER — Other Ambulatory Visit: Payer: Self-pay

## 2021-11-29 DIAGNOSIS — R319 Hematuria, unspecified: Secondary | ICD-10-CM

## 2021-11-30 ENCOUNTER — Ambulatory Visit: Payer: Medicare HMO | Admitting: Urology

## 2021-11-30 DIAGNOSIS — R319 Hematuria, unspecified: Secondary | ICD-10-CM

## 2021-12-06 ENCOUNTER — Ambulatory Visit: Payer: Medicare PPO | Admitting: Internal Medicine

## 2021-12-07 ENCOUNTER — Ambulatory Visit: Payer: Medicare PPO | Admitting: Internal Medicine

## 2021-12-14 ENCOUNTER — Other Ambulatory Visit (HOSPITAL_COMMUNITY): Payer: Self-pay

## 2021-12-14 ENCOUNTER — Ambulatory Visit: Payer: Medicare PPO | Admitting: Internal Medicine

## 2021-12-14 ENCOUNTER — Telehealth: Payer: Self-pay

## 2021-12-14 NOTE — Telephone Encounter (Signed)
Oral Oncology Patient Advocate Encounter   Received notification that patient is due for re-enrollment for assistance for Bosulif through [employer].   Re-enrollment process has been initiated and will be submitted upon completion of necessary documents. Re-enrollment submitted via online portal.  [employer]'s phone number [phone].   I will continue to follow until final determination.  Berdine Addison, [facility] Patient [place]  [phone] (phone) [phone] (fax) 12/14/2021 12:17 PM

## 2021-12-14 NOTE — Telephone Encounter (Signed)
Oral Oncology Patient Advocate Encounter   Submitted application for assistance for Bosulif to [employer].   Application submitted via e-fax to [phone]   [employer]'s phone number [phone].   I will continue to check the status until final determination.   Tara Levine, [facility] Patient [place]  [phone] (phone) [phone] (fax) 12/14/2021 2:42 PM

## 2021-12-14 NOTE — Telephone Encounter (Signed)
Oral Oncology Patient Advocate Encounter  Reached out and spoke with patient regarding PAP paperwork, explained that I would send it to their preferred email via [employer].   Confirmed email address: [email or web address].    Patient expressed understanding and consent.  Will follow up once paperwork has been signed and returned.   Tara Levine, [facility] Patient [place]  [phone] (phone) [phone] (fax) 12/14/2021 1:14 PM

## 2021-12-18 ENCOUNTER — Telehealth (INDEPENDENT_AMBULATORY_CARE_PROVIDER_SITE_OTHER): Payer: Medicare HMO | Admitting: Psychiatry

## 2021-12-18 DIAGNOSIS — F4310 Post-traumatic stress disorder, unspecified: Secondary | ICD-10-CM

## 2021-12-18 DIAGNOSIS — F3130 Bipolar disorder, current episode depressed, mild or moderate severity, unspecified: Secondary | ICD-10-CM | POA: Diagnosis not present

## 2021-12-18 DIAGNOSIS — L97512 Non-pressure chronic ulcer of other part of right foot with fat layer exposed: Secondary | ICD-10-CM | POA: Diagnosis not present

## 2021-12-18 MED ORDER — BUSPIRONE HCL 5 MG PO TABS: 10.0000 mg | ORAL_TABLET | Freq: Three times a day (TID) | ORAL | Status: DC

## 2021-12-18 MED ORDER — ALPRAZOLAM 1 MG PO TABS
1.0000 mg | ORAL_TABLET | Freq: Three times a day (TID) | ORAL | 5 refills | Status: DC
Start: 2021-12-18 — End: 2022-06-20

## 2021-12-18 MED ORDER — PREGABALIN 300 MG PO CAPS
300.0000 mg | ORAL_CAPSULE | Freq: Two times a day (BID) | ORAL | 5 refills | Status: DC
Start: 2021-12-18 — End: 2022-06-20

## 2021-12-18 MED ORDER — BUSPIRONE HCL 10 MG PO TABS: 10.0000 mg | ORAL_TABLET | Freq: Three times a day (TID) | ORAL | 5 refills | Status: DC

## 2021-12-18 MED ORDER — DULOXETINE HCL 20 MG PO CPEP: 20.0000 mg | ORAL_CAPSULE | Freq: Four times a day (QID) | ORAL | 5 refills | Status: DC

## 2021-12-18 NOTE — Progress Notes (Signed)
Virtual Visit via Telephone Note  I connected with Tara Levine on 12/18/21 at  1:00 PM EDT by telephone and verified that I am speaking with the correct person using two identifiers.  Location: Patient: Home Provider: Hospital   I discussed the limitations, risks, security and privacy concerns of performing an evaluation and management service by telephone and the availability of in person appointments. I also discussed with the patient that there may be a patient responsible charge related to this service. The patient expressed understanding and agreed to proceed.   History of Present Illness: Patient reached by telephone.  Identities established.  66 year old woman with a history of depression and anxiety PTSD multiple medical problems.  Depression has been worse.  Feels sad and tearful much of the time.  Very limited activity.  Hardly ever leaves her home.  Medical problems have been worse.  Denies any suicidal intent.  Denies psychotic symptoms.    Observations/Objective: Tearful but lucid no evidence of acute suicidal ideation or dangerousness or psychosis   Assessment and Plan: Reviewed medications and treatment options.  Expressed lots of empathy and support for her given her medical issues.  Talked with her through ultimate solutions of trying to find ways that she can get out of her house safely.  Reviewed options for improved treatment of depression.  Patient has not been on BuSpar previously.  Recommend adding 10 mg buspirone 3 times a day to her current medicines including the 60 mg of Cymbalta her Xanax and Lyrica.  Prescriptions for those medicines all refilled.  Follow-up 2 months.   Follow Up Instructions:    I discussed the assessment and treatment plan with the patient. The patient was provided an opportunity to ask questions and all were answered. The patient agreed with the plan and demonstrated an understanding of the instructions.   The patient was advised to call  back or seek an in-person evaluation if the symptoms worsen or if the condition fails to improve as anticipated.  I provided 20 minutes of non-face-to-face time during this encounter.   Alethia Berthold, MD

## 2021-12-21 ENCOUNTER — Encounter: Payer: Self-pay | Admitting: Internal Medicine

## 2021-12-21 ENCOUNTER — Ambulatory Visit: Payer: Medicare PPO | Admitting: Internal Medicine

## 2021-12-21 NOTE — Progress Notes (Deleted)
Established Patient Office Visit  Subjective:  Patient ID: Tara Levine, female    DOB: 02-Jul-1955  Age: 66 y.o. MRN: [id number]  CC:  No chief complaint on file.   HPI Tara Levine presents to follow-up on diabetes.  Diabetes, Type 2: -First diagnosed in 2011 -Last A1c 3/23 8.4%, 8.1% today. -Medications: Tresiba 25 units at night, Novolog 20 units at meals. Ozempic 1 mg weekly.  Jardiance 25 mg.  -Patient is compliant with the above medications and reports no side effects. -Checking BG at home: 200-220 in the morning. Not checking post-prandials  -Diet: eats oatmeal, cornbread and black eyes peas  -Eye exam: Due, referral placed to ophthalmology previously -Foot exam: Seeing Podiatry for wounds on toe, appears to be healing.  Has been on multiple antibiotics and is continuing to complain of vaginal yeast infections. -Microalbumin: Up-to-date 3/23 -Statin: Yes -PNA vaccine: Prevnar 13 in 2015 -Has symptoms of diabetic neuropathy on Lyrica, also follows with podiatry for ulcer on right foot  Hypertension/CHF: -Medications: Losartan 100, Amlodipine 5, Lasix 40 mg  -Patient is compliant with above medications and reports no side effects. -Checking BP at home (average): 136/72 -Denies any SOB, CP, vision changes, LE edema or symptoms of hypotension -last echo /22 EF 55-60%  HLD: -Medications: Lipitor 20 mg -Patient is compliant with above medications and reports no side effects.  -Last lipid panel: Lipid Panel     Component Value Date/Time   CHOL 130 05/22/2021 1446   TRIG 166 (H) 05/22/2021 1446   TRIG 418 (H) 06/01/2012 1224   HDL 42 (L) 05/22/2021 1446   CHOLHDL 3.1 05/22/2021 1446   LDLCALC 64 05/22/2021 1446   CML: -Following with Oncology, taking Bosulif 400 mg daily   MDD: -Following with Psychiatry  -Current treatment: Cymbalta 20 TID and 30 at night, xanax TID     10/26/2021    2:11 PM 10/18/2021    1:45 PM 08/31/2021    3:10 PM 08/15/2021   11:01 AM  08/01/2021   11:29 AM  Depression screen PHQ 2/9  Decreased Interest 0 0 3 0 0  Down, Depressed, Hopeless 0 0 3 0 0  PHQ - 2 Score 0 0 6 0 0  Altered sleeping 0 0 3 0 0  Tired, decreased energy 0 0 3 0 0  Change in appetite 0 0 3 0 0  Feeling bad or failure about yourself  0 0 3 0 0  Trouble concentrating 0 0 1 0 0  Moving slowly or fidgety/restless 0 0 1 0 0  Suicidal thoughts 0 0 0 0 0  PHQ-9 Score 0 0 20 0 0  Difficult doing work/chores Not difficult at all Not difficult at all Very difficult Not difficult at all Not difficult at all   Chronic Pain: -Pain in back, shoulders and neck -Follows with pain management  -On opioids, having opioid induced constipation   Past Medical History:  Diagnosis Date   CML (chronic myelocytic leukemia) (HCC)    Depression    DM type 2 (diabetes mellitus, type 2) (HCC)    Environmental allergies    History of gastric ulcer    HLD (hyperlipidemia)    IDA (iron deficiency anemia)    chronic   Leukemia (HCC)    Leukemia, chronic myeloid (HCC)    Osteoarthritis    Panic disorder    PSS (progressive systemic sclerosis) (HCC)    Unspecified essential hypertension     Past Surgical History:  Procedure Laterality Date  back and neck surgery  1999, 2009   had rods placed   [place] [facility]) No blackages found.    CARPAL TUNNEL RELEASE     PARTIAL HYSTERECTOMY     age 23, no cancer    Family History  Problem Relation Age of Onset   Diabetes Mother    Hypertension Mother    Heart disease Mother    Prostate cancer Father    Heart disease Father    Diabetes Brother     Social History   Socioeconomic History   Marital status: Married    Spouse name: Not on file   Number of children: Not on file   Years of education: Not on file   Highest education level: Not on file  Occupational History   Not on file  Tobacco Use   Smoking status: Never   Smokeless tobacco: Never   Tobacco  comments:    quit 1985  Vaping Use   Vaping Use: Never used  Substance and Sexual Activity   Alcohol use: No    Alcohol/week: 0.0 standard drinks of alcohol   Drug use: No   Sexual activity: Not Currently  Other Topics Concern   Not on file  Social History Narrative   Not on file   Social Determinants of Health   Financial Resource Strain: Not on file  Food Insecurity: Not on file  Transportation Needs: Not on file  Physical Activity: Not on file  Stress: Not on file  Social Connections: Not on file  Intimate Partner Violence: Not on file    ROS Review of Systems  All other systems reviewed and are negative.   Objective:   Today's Vitals: There were no vitals taken for this visit.  Physical Exam Constitutional:      Appearance: Normal appearance. She is obese.     Comments: Presents in wheelchair  HENT:     Head: Normocephalic and atraumatic.     Mouth/Throat:     Mouth: Mucous membranes are moist.     Pharynx: Oropharynx is clear.     Comments: PND present Eyes:     Extraocular Movements: Extraocular movements intact.     Conjunctiva/sclera: Conjunctivae normal.     Pupils: Pupils are equal, round, and reactive to light.  Cardiovascular:     Rate and Rhythm: Normal rate and regular rhythm.  Pulmonary:     Effort: Pulmonary effort is normal.     Breath sounds: Normal breath sounds.  Musculoskeletal:     Right lower leg: No edema.     Left lower leg: No edema.  Skin:    General: Skin is warm and dry.  Neurological:     General: No focal deficit present.     Mental Status: She is alert. Mental status is at baseline.  Psychiatric:        Mood and Affect: Mood normal.        Behavior: Behavior normal.     Assessment & Plan:   1. Type 2 diabetes mellitus without complication, with long-term current use of insulin ([place]): A1c in the office today slightly improved to 8.1%.  Continue Tresiba 25 units at night, NovoLog 20 units 3 times daily at meals.   Continue Ozempic 1 mg weekly.  Patient insistent to be on Jardiance.  Discussed potential side effects including increased risk of urinary tract infections and urinary frequency.  Start Jardiance 25 mg daily.  Follow-up in 3 months for recheck  of A1c.  Patient also would like to switch from Dexcom 6 to [facility] 7, transmitter and sensors sent to pharmacy.  - POCT HgB A1C - Continuous Blood Gluc Sensor (DEXCOM G7 SENSOR) MISC; 1 each by Does not apply route every 14 (fourteen) days.  Dispense: 12 each; Refill: 1 - empagliflozin (JARDIANCE) 25 MG TABS tablet; Take 1 tablet (25 mg total) by mouth daily before breakfast.  Dispense: 30 tablet; Refill: 3 - Continuous Blood Gluc Receiver ([place]) DEVI; 1 each by Does not apply route every 14 (fourteen) days.  Dispense: 1 each; Refill: 3  2. Primary hypertension: Chronic and stable.  Continue losartan 100 mg, amlodipine 5 mg and Lasix 40 mg daily.  3. Hypercholesterolemia: Chronic and stable.  Continue Lipitor 20 mg daily.  4. Urinary incontinence, unspecified type: Forms filled out to qualify for adult incontinence underwear.  5. Vaginal yeast infection: Secondary to multiple antibiotics.  Diflucan sent to pharmacy.  - fluconazole (DIFLUCAN) 150 MG tablet; Take 1 tablet (150 mg total) by mouth once a week. For four weeks.  Dispense: 4 tablet; Refill: 0  Follow-up: No follow-ups on file.   Teodora Medici, DO

## 2021-12-24 NOTE — Progress Notes (Deleted)
Established Patient Office Visit  Subjective:  Patient ID: Tara Levine, female    DOB: [birth year]  Age: 66 y.o. MRN: [id number]  CC:  No chief complaint on file.   HPI Tara Levine presents to follow-up on diabetes.  Diabetes, Type 2: -First diagnosed in 2011 -Last A1c 3/23 8.4%, 8.1% today. -Medications: Tresiba 25 units at night, Novolog 20 units at meals. Ozempic 1 mg weekly.  Jardiance 25 mg.  -Patient is compliant with the above medications and reports no side effects. -Checking BG at home: 200-220 in the morning. Not checking post-prandials  -Diet: eats oatmeal, cornbread and black eyes peas  -Eye exam: Due, referral placed to ophthalmology previously -Foot exam: Seeing Podiatry for wounds on toe, appears to be healing.  Has been on multiple antibiotics and is continuing to complain of vaginal yeast infections. -Microalbumin: Up-to-date 3/23 -Statin: Yes -PNA vaccine: Prevnar 13 in 2015 -Has symptoms of diabetic neuropathy on Lyrica, also follows with podiatry for ulcer on right foot  Hypertension/CHF: -Medications: Losartan 100, Amlodipine 5, Lasix 40 mg  -Patient is compliant with above medications and reports no side effects. -Checking BP at home (average): 136/72 -Denies any SOB, CP, vision changes, LE edema or symptoms of hypotension -last echo /22 EF 55-60%  HLD: -Medications: Lipitor 20 mg -Patient is compliant with above medications and reports no side effects.  -Last lipid panel: Lipid Panel     Component Value Date/Time   CHOL 130 05/22/2021 1446   TRIG 166 (H) 05/22/2021 1446   TRIG 418 (H) 06/01/2012 1224   HDL 42 (L) 05/22/2021 1446   CHOLHDL 3.1 05/22/2021 1446   LDLCALC 64 05/22/2021 1446   CML: -Following with Oncology, taking Bosulif 400 mg daily   MDD: -Following with Psychiatry  -Current treatment: Cymbalta 20 TID and 30 at night, xanax TID     10/26/2021    2:11 PM 10/18/2021    1:45 PM 08/31/2021    3:10 PM 08/15/2021   11:01 AM  08/01/2021   11:29 AM  Depression screen PHQ 2/9  Decreased Interest 0 0 3 0 0  Down, Depressed, Hopeless 0 0 3 0 0  PHQ - 2 Score 0 0 6 0 0  Altered sleeping 0 0 3 0 0  Tired, decreased energy 0 0 3 0 0  Change in appetite 0 0 3 0 0  Feeling bad or failure about yourself  0 0 3 0 0  Trouble concentrating 0 0 1 0 0  Moving slowly or fidgety/restless 0 0 1 0 0  Suicidal thoughts 0 0 0 0 0  PHQ-9 Score 0 0 20 0 0  Difficult doing work/chores Not difficult at all Not difficult at all Very difficult Not difficult at all Not difficult at all   Chronic Pain: -Pain in back, shoulders and neck -Follows with pain management  -On opioids, having opioid induced constipation   Past Medical History:  Diagnosis Date   CML (chronic myelocytic leukemia) (HCC)    Depression    DM type 2 (diabetes mellitus, type 2) (HCC)    Environmental allergies    History of gastric ulcer    HLD (hyperlipidemia)    IDA (iron deficiency anemia)    chronic   Leukemia (HCC)    Leukemia, chronic myeloid (HCC)    Osteoarthritis    Panic disorder    PSS (progressive systemic sclerosis) (HCC)    Unspecified essential hypertension     Past Surgical History:  Procedure Laterality Date  back and neck surgery  1999, 2009   had rods placed   [facility]) No blackages found.    CARPAL TUNNEL RELEASE     PARTIAL HYSTERECTOMY     age 43, no cancer    Family History  Problem Relation Age of Onset   Diabetes Mother    Hypertension Mother    Heart disease Mother    Prostate cancer Father    Heart disease Father    Diabetes Brother     Social History   Socioeconomic History   Marital status: Married    Spouse name: Not on file   Number of children: Not on file   Years of education: Not on file   Highest education level: Not on file  Occupational History   Not on file  Tobacco Use   Smoking status: Never   Smokeless tobacco: Never   Tobacco  comments:    quit 1985  Vaping Use   Vaping Use: Never used  Substance and Sexual Activity   Alcohol use: No    Alcohol/week: 0.0 standard drinks of alcohol   Drug use: No   Sexual activity: Not Currently  Other Topics Concern   Not on file  Social History Narrative   Not on file   Social Determinants of Health   Financial Resource Strain: Not on file  Food Insecurity: Not on file  Transportation Needs: Not on file  Physical Activity: Not on file  Stress: Not on file  Social Connections: Not on file  Intimate Partner Violence: Not on file    ROS Review of Systems  All other systems reviewed and are negative.   Objective:   Today's Vitals: There were no vitals taken for this visit.  Physical Exam Constitutional:      Appearance: Normal appearance. She is obese.     Comments: Presents in wheelchair  HENT:     Head: Normocephalic and atraumatic.     Mouth/Throat:     Mouth: Mucous membranes are moist.     Pharynx: Oropharynx is clear.     Comments: PND present Eyes:     Extraocular Movements: Extraocular movements intact.     Conjunctiva/sclera: Conjunctivae normal.     Pupils: Pupils are equal, round, and reactive to light.  Cardiovascular:     Rate and Rhythm: Normal rate and regular rhythm.  Pulmonary:     Effort: Pulmonary effort is normal.     Breath sounds: Normal breath sounds.  Musculoskeletal:     Right lower leg: No edema.     Left lower leg: No edema.  Skin:    General: Skin is warm and dry.  Neurological:     General: No focal deficit present.     Mental Status: She is alert. Mental status is at baseline.  Psychiatric:        Mood and Affect: Mood normal.        Behavior: Behavior normal.     Assessment & Plan:   1. Type 2 diabetes mellitus without complication, with long-term current use of insulin ([place]): A1c in the office today slightly improved to 8.1%.  Continue Tresiba 25 units at night, NovoLog 20 units 3 times daily at meals.   Continue Ozempic 1 mg weekly.  Patient insistent to be on Jardiance.  Discussed potential side effects including increased risk of urinary tract infections and urinary frequency.  Start Jardiance 25 mg daily.  Follow-up in 3 months for recheck  of A1c.  Patient also would like to switch from Dexcom 6 to [facility] 7, transmitter and sensors sent to pharmacy.  - POCT HgB A1C - Continuous Blood Gluc Sensor (DEXCOM G7 SENSOR) MISC; 1 each by Does not apply route every 14 (fourteen) days.  Dispense: 12 each; Refill: 1 - empagliflozin (JARDIANCE) 25 MG TABS tablet; Take 1 tablet (25 mg total) by mouth daily before breakfast.  Dispense: 30 tablet; Refill: 3 - Continuous Blood Gluc Receiver (Menan) DEVI; 1 each by Does not apply route every 14 (fourteen) days.  Dispense: 1 each; Refill: 3  2. Primary hypertension: Chronic and stable.  Continue losartan 100 mg, amlodipine 5 mg and Lasix 40 mg daily.  3. Hypercholesterolemia: Chronic and stable.  Continue Lipitor 20 mg daily.  4. Urinary incontinence, unspecified type: Forms filled out to qualify for adult incontinence underwear.  5. Vaginal yeast infection: Secondary to multiple antibiotics.  Diflucan sent to pharmacy.  - fluconazole (DIFLUCAN) 150 MG tablet; Take 1 tablet (150 mg total) by mouth once a week. For four weeks.  Dispense: 4 tablet; Refill: 0  Follow-up: No follow-ups on file.   Teodora Medici, DO

## 2021-12-25 ENCOUNTER — Encounter: Payer: Self-pay | Admitting: Internal Medicine

## 2021-12-25 ENCOUNTER — Ambulatory Visit: Payer: Medicare PPO | Admitting: Internal Medicine

## 2021-12-25 DIAGNOSIS — E119 Type 2 diabetes mellitus without complications: Secondary | ICD-10-CM

## 2021-12-27 ENCOUNTER — Telehealth: Payer: Self-pay | Admitting: Internal Medicine

## 2021-12-27 NOTE — Telephone Encounter (Signed)
[facility] needs a PA due to not being covered by insurance / please advise

## 2021-12-27 NOTE — Telephone Encounter (Signed)
Working on PA

## 2022-01-04 DIAGNOSIS — Z79899 Other long term (current) drug therapy: Secondary | ICD-10-CM | POA: Diagnosis not present

## 2022-01-04 DIAGNOSIS — M545 Low back pain, unspecified: Secondary | ICD-10-CM | POA: Diagnosis not present

## 2022-01-04 DIAGNOSIS — Z79891 Long term (current) use of opiate analgesic: Secondary | ICD-10-CM | POA: Diagnosis not present

## 2022-01-04 DIAGNOSIS — M5412 Radiculopathy, cervical region: Secondary | ICD-10-CM | POA: Diagnosis not present

## 2022-01-04 DIAGNOSIS — G8929 Other chronic pain: Secondary | ICD-10-CM | POA: Diagnosis not present

## 2022-01-04 DIAGNOSIS — M7918 Myalgia, other site: Secondary | ICD-10-CM | POA: Diagnosis not present

## 2022-01-06 ENCOUNTER — Other Ambulatory Visit: Payer: Self-pay | Admitting: Internal Medicine

## 2022-01-07 ENCOUNTER — Inpatient Hospital Stay: Payer: Medicare HMO | Attending: Oncology

## 2022-01-07 NOTE — Telephone Encounter (Signed)
Requested Prescriptions  Pending Prescriptions Disp Refills   Accu-Chek Softclix Lancets lancets [Pharmacy Med Name: ACCU-CHEK SOFTCLIX LANCETS] 300 each 10    Sig: TEST BLOOD SUGAR THREE TIMES DAILY AS INSTRUCTED     Endocrinology: Diabetes - Testing Supplies Passed - 01/06/2022  4:18 AM      Passed - Valid encounter within last 12 months    Recent Outpatient Visits           2 months ago Fall, initial encounter   [facility] Teodora Medici, DO   2 months ago Uncontrolled type 2 diabetes mellitus with hyperglycemia, with long-term current use of insulin [facility])   [facility] Teodora Medici, DO   3 months ago Type 2 diabetes mellitus without complication, with long-term current use of insulin [facility])   [facility] Teodora Medici, DO   4 months ago Type 2 diabetes mellitus without complication, with long-term current use of insulin [facility])   [facility] Teodora Medici, DO   4 months ago Type 2 diabetes mellitus without complication, with long-term current use of insulin [name]   [place], Jake Church, DO       Future Appointments             Tomorrow Earlie Server, MD [facility] [place]Medical Oncology             Alcohol Swabs (DROPSAFE ALCOHOL PREP) 70 % PADS [Pharmacy Med Name: DROPSAFE ALCOHOL PREP PADS 70 % Pad] 300 each 10    Sig: USE THREE TIMES DAILY  TO CHECK BLOOD SUGAR     Off-Protocol Failed - 01/06/2022  4:18 AM      Failed - Medication not assigned to a protocol, review manually.      Passed - Valid encounter within last 12 months    Recent Outpatient Visits           2 months ago Fall, initial encounter   [facility] Teodora Medici, DO   2 months ago Uncontrolled type 2 diabetes mellitus with hyperglycemia, with long-term current use of insulin [facility])   [facility] Teodora Medici, DO   3  months ago Type 2 diabetes mellitus without complication, with long-term current use of insulin [facility])   [facility] Teodora Medici, DO   4 months ago Type 2 diabetes mellitus without complication, with long-term current use of insulin [facility])   [facility] Teodora Medici, DO   4 months ago Type 2 diabetes mellitus without complication, with long-term current use of insulin [facility])   [facility] Myles Gip, DO       Future Appointments             Tomorrow Earlie Server, MD [facility] [place] test strip [Pharmacy Med Name: ACCU-CHEK GUIDE   Strip] 300 strip 10    Sig: TEST DAILY AND AS DIRECTED.     Endocrinology: Diabetes - Testing Supplies Passed - 01/06/2022  4:18 AM      Passed - Valid encounter within last 12 months    Recent Outpatient Visits           2 months ago Fall, initial encounter   [place], DO   2 months ago Uncontrolled type 2 diabetes mellitus with hyperglycemia, with long-term current  use of insulin [facility])   [facility] Teodora Medici, DO   3 months ago Type 2 diabetes mellitus without complication, with long-term current use of insulin [facility])   [facility] Teodora Medici, DO   4 months ago Type 2 diabetes mellitus without complication, with long-term current use of insulin [facility])   [facility] Teodora Medici, DO   4 months ago Type 2 diabetes mellitus without complication, with long-term current use of insulin [facility])   [place], Jake Church, DO       Future Appointments             Tomorrow Earlie Server, MD [facility] [place]

## 2022-01-08 ENCOUNTER — Inpatient Hospital Stay: Payer: Medicare HMO | Admitting: Oncology

## 2022-01-08 ENCOUNTER — Encounter: Payer: Self-pay | Admitting: Oncology

## 2022-01-13 NOTE — Progress Notes (Signed)
Encounter was cancelled

## 2022-01-15 ENCOUNTER — Telehealth (INDEPENDENT_AMBULATORY_CARE_PROVIDER_SITE_OTHER): Payer: Medicare HMO | Admitting: Internal Medicine

## 2022-01-15 ENCOUNTER — Encounter: Payer: Self-pay | Admitting: Internal Medicine

## 2022-01-15 DIAGNOSIS — E119 Type 2 diabetes mellitus without complications: Secondary | ICD-10-CM | POA: Diagnosis not present

## 2022-01-15 DIAGNOSIS — Z794 Long term (current) use of insulin: Secondary | ICD-10-CM

## 2022-01-15 DIAGNOSIS — J0100 Acute maxillary sinusitis, unspecified: Secondary | ICD-10-CM | POA: Diagnosis not present

## 2022-01-15 MED ORDER — AMOXICILLIN-POT CLAVULANATE 875-125 MG PO TABS: 1.0000 | ORAL_TABLET | Freq: Two times a day (BID) | ORAL | 0 refills | Status: AC

## 2022-01-15 MED ORDER — SEMAGLUTIDE (1 MG/DOSE) 4 MG/3ML ~~LOC~~ SOPN: 1.0000 mg | PEN_INJECTOR | SUBCUTANEOUS | 0 refills | Status: AC

## 2022-01-15 NOTE — Progress Notes (Signed)
Virtual Visit via Video Note  I connected with Tara Levine on 01/15/22 at  1:40 PM EST by a video enabled telemedicine application and verified that I am speaking with the correct person using two identifiers.  Location: Patient: Home Provider: [facility]   I discussed the limitations of evaluation and management by telemedicine and the availability of in person appointments. The patient expressed understanding and agreed to proceed.  History of Present Illness:  Patient presenting via telemedicine for exposure to COVID. She was exposed a few weeks ago by her granddaughter and son. Her son tested positive 2 weeks ago. Patient's symptoms going on 1 week.   URI Compliant:  -Fever: no -Cough: yes, productive yellow  -Shortness of breath: yes -Wheezing: yes -Chest congestion: yes -Nasal congestion: yes -Runny nose: yes, yellow mucus  -Sore throat:  -Sinus pressure: yes -Face pain: yes right side  -Ear pain: no  -Ear pressure: no   -Context: worse -Also has a stye on her right eye but it is resolving. Has watery eyes.  -Relief with OTC cold/cough medications: no  -Treatments attempted: Theraflu    Observations/Objective:  General: well appearing, no acute distress ENT: conjunctiva normal appearing bilaterally  Neuro: answers all questions appropriately  Assessment and Plan:  1. Acute maxillary sinusitis, recurrence not specified: Due to sinus pain/pressure she will be treated with an antibiotic. Discussed having her come in to the clinic for COVID testing which the patient declines, states she will take a home test.   - amoxicillin-clavulanate (AUGMENTIN) 875-125 MG tablet; Take 1 tablet by mouth 2 (two) times daily for 5 days.  Dispense: 10 tablet; Refill: 0  2. Type 2 diabetes mellitus without complication, with long-term current use of insulin ([place]): Ozempic refilled.   - Semaglutide, 1 MG/DOSE, 4 MG/3ML SOPN; Inject 1 mg as directed once a week.  Dispense: 3 mL; Refill: 0     I discussed the assessment and treatment plan with the patient. The patient was provided an opportunity to ask questions and all were answered. The patient agreed with the plan and demonstrated an understanding of the instructions.   The patient was advised to call back or seek an in-person evaluation if the symptoms worsen or if the condition fails to improve as anticipated.  I provided 11 minutes of non-face-to-face time during this encounter.   Teodora Medici, DO

## 2022-01-23 ENCOUNTER — Other Ambulatory Visit: Payer: Medicare HMO

## 2022-01-23 ENCOUNTER — Other Ambulatory Visit: Payer: Self-pay

## 2022-01-23 DIAGNOSIS — Z1211 Encounter for screening for malignant neoplasm of colon: Secondary | ICD-10-CM | POA: Diagnosis not present

## 2022-01-23 DIAGNOSIS — C9210 Chronic myeloid leukemia, BCR/ABL-positive, not having achieved remission: Secondary | ICD-10-CM

## 2022-01-24 ENCOUNTER — Inpatient Hospital Stay: Payer: Medicare HMO

## 2022-01-25 DIAGNOSIS — R32 Unspecified urinary incontinence: Secondary | ICD-10-CM | POA: Diagnosis not present

## 2022-01-31 ENCOUNTER — Other Ambulatory Visit: Payer: Medicare HMO

## 2022-01-31 ENCOUNTER — Inpatient Hospital Stay: Payer: Medicare HMO | Attending: Oncology

## 2022-01-31 DIAGNOSIS — C9210 Chronic myeloid leukemia, BCR/ABL-positive, not having achieved remission: Secondary | ICD-10-CM | POA: Diagnosis not present

## 2022-01-31 DIAGNOSIS — R110 Nausea: Secondary | ICD-10-CM | POA: Diagnosis not present

## 2022-01-31 DIAGNOSIS — E1165 Type 2 diabetes mellitus with hyperglycemia: Secondary | ICD-10-CM | POA: Insufficient documentation

## 2022-01-31 DIAGNOSIS — Z794 Long term (current) use of insulin: Secondary | ICD-10-CM | POA: Diagnosis not present

## 2022-01-31 DIAGNOSIS — F339 Major depressive disorder, recurrent, unspecified: Secondary | ICD-10-CM | POA: Diagnosis not present

## 2022-01-31 DIAGNOSIS — Z9181 History of falling: Secondary | ICD-10-CM | POA: Diagnosis not present

## 2022-01-31 DIAGNOSIS — G4733 Obstructive sleep apnea (adult) (pediatric): Secondary | ICD-10-CM | POA: Diagnosis not present

## 2022-01-31 DIAGNOSIS — Z780 Asymptomatic menopausal state: Secondary | ICD-10-CM | POA: Diagnosis not present

## 2022-01-31 DIAGNOSIS — E538 Deficiency of other specified B group vitamins: Secondary | ICD-10-CM | POA: Diagnosis not present

## 2022-01-31 DIAGNOSIS — E611 Iron deficiency: Secondary | ICD-10-CM | POA: Diagnosis not present

## 2022-01-31 DIAGNOSIS — E1122 Type 2 diabetes mellitus with diabetic chronic kidney disease: Secondary | ICD-10-CM | POA: Diagnosis not present

## 2022-01-31 DIAGNOSIS — Z0000 Encounter for general adult medical examination without abnormal findings: Secondary | ICD-10-CM | POA: Diagnosis not present

## 2022-01-31 LAB — CBC WITH DIFFERENTIAL/PLATELET
Abs Immature Granulocytes: 0.03 10*3/uL (ref 0.00–0.07)
Basophils Absolute: 0 10*3/uL (ref 0.0–0.1)
Basophils Relative: 0 %
Eosinophils Absolute: 0.1 10*3/uL (ref 0.0–0.5)
Eosinophils Relative: 2 %
HCT: 43.9 % (ref 36.0–46.0)
Hemoglobin: 14.4 g/dL (ref 12.0–15.0)
Immature Granulocytes: 1 %
Lymphocytes Relative: 14 %
Lymphs Abs: 0.8 10*3/uL (ref 0.7–4.0)
MCH: 27.5 pg (ref 26.0–34.0)
MCHC: 32.8 g/dL (ref 30.0–36.0)
MCV: 83.9 fL (ref 80.0–100.0)
Monocytes Absolute: 0.6 10*3/uL (ref 0.1–1.0)
Monocytes Relative: 9 %
Neutro Abs: 4.6 10*3/uL (ref 1.7–7.7)
Neutrophils Relative %: 74 %
Platelets: 243 10*3/uL (ref 150–400)
RBC: 5.23 MIL/uL — ABNORMAL HIGH (ref 3.87–5.11)
RDW: 13.4 % (ref 11.5–15.5)
WBC: 6.1 10*3/uL (ref 4.0–10.5)
nRBC: 0 % (ref 0.0–0.2)

## 2022-01-31 LAB — COMPREHENSIVE METABOLIC PANEL
ALT: 27 U/L (ref 0–44)
AST: 32 U/L (ref 15–41)
Albumin: 4.1 g/dL (ref 3.5–5.0)
Alkaline Phosphatase: 79 U/L (ref 38–126)
Anion gap: 10 (ref 5–15)
BUN: 11 mg/dL (ref 8–23)
CO2: 28 mmol/L (ref 22–32)
Calcium: 9.6 mg/dL (ref 8.9–10.3)
Chloride: 102 mmol/L (ref 98–111)
Creatinine, Ser: 1.12 mg/dL — ABNORMAL HIGH (ref 0.44–1.00)
GFR, Estimated: 54 mL/min — ABNORMAL LOW (ref >60)
Glucose, Bld: 165 mg/dL — ABNORMAL HIGH (ref 70–99)
Potassium: 4.1 mmol/L (ref 3.5–5.1)
Sodium: 140 mmol/L (ref 135–145)
Total Bilirubin: 0.5 mg/dL (ref 0.3–1.2)
Total Protein: 7.6 g/dL (ref 6.5–8.1)

## 2022-02-01 LAB — COLOGUARD: COLOGUARD: NEGATIVE

## 2022-02-04 ENCOUNTER — Telehealth: Payer: Self-pay

## 2022-02-04 NOTE — Telephone Encounter (Signed)
   Fax received from [facility] pharmacy for a prior auth for the Cymbalta 20 mg DR capsules. Initiated auth message from covermymeds Available without auth. Called pharmacy Rx needs to be written for generic brand please advise.

## 2022-02-05 ENCOUNTER — Telehealth: Payer: Self-pay | Admitting: Internal Medicine

## 2022-02-05 LAB — BCR-ABL1 FISH
Cells Analyzed: 200
Cells Counted: 200

## 2022-02-05 NOTE — Telephone Encounter (Signed)
Pt was informed that Dr Rosana Berger is no longer her PCP

## 2022-02-05 NOTE — Telephone Encounter (Signed)
Pt stated that the ER is why she started taking pantoprazole (PROTONIX) 40 MG tablet / pt would rather have Rx for Omeprazole / she states this works better for her and would like refill sent to  [facility] [address] - [place], [place] - [address] AT [facility] [address]    Please advise

## 2022-02-07 ENCOUNTER — Telehealth: Payer: Medicare HMO | Admitting: Oncology

## 2022-02-08 ENCOUNTER — Other Ambulatory Visit: Payer: Self-pay | Admitting: Internal Medicine

## 2022-02-08 DIAGNOSIS — Z79891 Long term (current) use of opiate analgesic: Secondary | ICD-10-CM | POA: Diagnosis not present

## 2022-02-08 DIAGNOSIS — E119 Type 2 diabetes mellitus without complications: Secondary | ICD-10-CM

## 2022-02-08 DIAGNOSIS — M545 Low back pain, unspecified: Secondary | ICD-10-CM | POA: Diagnosis not present

## 2022-02-08 DIAGNOSIS — G8929 Other chronic pain: Secondary | ICD-10-CM | POA: Diagnosis not present

## 2022-02-11 ENCOUNTER — Other Ambulatory Visit: Payer: Self-pay | Admitting: Internal Medicine

## 2022-02-11 DIAGNOSIS — Z794 Long term (current) use of insulin: Secondary | ICD-10-CM

## 2022-02-11 LAB — BCR-ABL1, CML/ALL, PCR, QUANT: b3a2 transcript: 0.0251 %

## 2022-02-12 ENCOUNTER — Other Ambulatory Visit: Payer: Self-pay | Admitting: Psychiatry

## 2022-02-12 MED ORDER — DULOXETINE HCL 20 MG PO CPEP: 20.0000 mg | ORAL_CAPSULE | Freq: Four times a day (QID) | ORAL | 5 refills | Status: DC

## 2022-02-14 ENCOUNTER — Other Ambulatory Visit: Payer: Self-pay

## 2022-02-14 ENCOUNTER — Inpatient Hospital Stay (HOSPITAL_BASED_OUTPATIENT_CLINIC_OR_DEPARTMENT_OTHER): Payer: Medicare HMO | Admitting: Oncology

## 2022-02-14 ENCOUNTER — Encounter: Payer: Self-pay | Admitting: Oncology

## 2022-02-14 ENCOUNTER — Other Ambulatory Visit (HOSPITAL_COMMUNITY): Payer: Self-pay

## 2022-02-14 DIAGNOSIS — C9210 Chronic myeloid leukemia, BCR/ABL-positive, not having achieved remission: Secondary | ICD-10-CM

## 2022-02-14 DIAGNOSIS — Z5111 Encounter for antineoplastic chemotherapy: Secondary | ICD-10-CM

## 2022-02-14 MED ORDER — BOSUTINIB 100 MG PO TABS
300.0000 mg | ORAL_TABLET | Freq: Every day | ORAL | 1 refills | Status: DC
  Filled 2022-02-14: qty 90, 30d supply, fill #0

## 2022-02-14 NOTE — Assessment & Plan Note (Addendum)
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 02/20/2021, BCR ABL 1 FISH is negative.b3 a2 transcript 0.27%. 08/03/2021, BCR-ABL1 FISH is negative,b3 a2 transcript 0.0521%. 10/26/2021 BCR-ABL1 FISH is negative,b3 a2 transcript 0.0458% 01/31/2022 BCR-ABL1 FISH is negative,b3 a2 transcript 0.0251% Recommend patient to continue bosutinib,resume to '300mg'$  daily as her renal function has improved.  Refill prescription has been sent to pharmacy.

## 2022-02-15 ENCOUNTER — Other Ambulatory Visit: Payer: Self-pay

## 2022-02-15 ENCOUNTER — Other Ambulatory Visit: Payer: Self-pay | Admitting: Internal Medicine

## 2022-02-15 ENCOUNTER — Ambulatory Visit
Admission: RE | Admit: 2022-02-15 | Discharge: 2022-02-15 | Disposition: A | Discharge status: Home / Self Care | Payer: Medicare HMO | Source: Ambulatory Visit | Attending: Internal Medicine | Admitting: Internal Medicine

## 2022-02-15 ENCOUNTER — Ambulatory Visit
Admission: RE | Admit: 2022-02-15 | Discharge: 2022-02-15 | Disposition: A | Discharge status: Home / Self Care | Payer: Medicare HMO | Attending: Internal Medicine | Admitting: Internal Medicine

## 2022-02-15 ENCOUNTER — Other Ambulatory Visit (HOSPITAL_COMMUNITY): Payer: Self-pay

## 2022-02-15 ENCOUNTER — Other Ambulatory Visit: Payer: Self-pay | Admitting: Pharmacist

## 2022-02-15 DIAGNOSIS — M25561 Pain in right knee: Secondary | ICD-10-CM | POA: Insufficient documentation

## 2022-02-15 DIAGNOSIS — M25552 Pain in left hip: Secondary | ICD-10-CM | POA: Diagnosis not present

## 2022-02-15 DIAGNOSIS — M25562 Pain in left knee: Secondary | ICD-10-CM | POA: Insufficient documentation

## 2022-02-15 DIAGNOSIS — M25551 Pain in right hip: Secondary | ICD-10-CM

## 2022-02-15 DIAGNOSIS — C9210 Chronic myeloid leukemia, BCR/ABL-positive, not having achieved remission: Secondary | ICD-10-CM

## 2022-02-15 DIAGNOSIS — Z780 Asymptomatic menopausal state: Secondary | ICD-10-CM | POA: Diagnosis not present

## 2022-02-15 DIAGNOSIS — Z794 Long term (current) use of insulin: Secondary | ICD-10-CM

## 2022-02-15 DIAGNOSIS — M170 Bilateral primary osteoarthritis of knee: Secondary | ICD-10-CM | POA: Diagnosis not present

## 2022-02-15 DIAGNOSIS — M160 Bilateral primary osteoarthritis of hip: Secondary | ICD-10-CM | POA: Diagnosis not present

## 2022-02-15 MED ORDER — BOSUTINIB 100 MG PO TABS
400.0000 mg | ORAL_TABLET | Freq: Every day | ORAL | 1 refills | Status: DC
  Filled 2022-02-15 (×2): qty 120, 30d supply, fill #0
  Filled 2022-03-13: qty 120, 30d supply, fill #1

## 2022-02-17 NOTE — Assessment & Plan Note (Signed)
Treatment plan as listed above. 

## 2022-02-17 NOTE — Progress Notes (Signed)
HEMATOLOGY-ONCOLOGY TeleHEALTH VISIT PROGRESS NOTE  I connected with Tara Levine on 02/17/22  at  2:45 PM EST by video enabled telemedicine visit and verified that I am speaking with the correct person using two identifiers. I discussed the limitations, risks, security and privacy concerns of performing an evaluation and management service by telemedicine and the availability of in-person appointments. The patient expressed understanding and agreed to proceed.   Other persons participating in the visit and their role in the encounter:  None  Patient's location: Home  Provider's location: office Chief Complaint: Tara Levine is a 66 y.o. female who has above history reviewed by me today presents virtually for follow up visit for management of CML Patients requests virtual visits due to difficulty in ambulating and not able to come for in person follow up  I discussed the limitations of evaluation and management by telemedicine. The patient expressed understanding and agreed to proceed. She takes Bosutinib '300mg'$  daily.  She reports nausea recently. She has never has nausea as side effect of Bosutinib,    Review of Systems  Constitutional:  Positive for fatigue. Negative for chills and fever.  HENT:   Negative for hearing loss and voice change.   Eyes:  Negative for eye problems.  Respiratory:  Positive for shortness of breath. Negative for chest tightness and cough.   Cardiovascular:  Negative for chest pain.  Gastrointestinal:  Positive for nausea. Negative for abdominal distention, abdominal pain and blood in stool.  Endocrine: Negative for hot flashes.  Genitourinary:  Negative for difficulty urinating, frequency and hematuria.   Musculoskeletal:  Positive for flank pain. Negative for arthralgias.  Skin:  Negative for itching and rash.  Neurological:  Negative for extremity weakness.  Hematological:  Negative for adenopathy.  Psychiatric/Behavioral:   Negative for confusion. The patient is not nervous/anxious.     Past Medical History:  Diagnosis Date   CML (chronic myelocytic leukemia) ([place])    Depression    DM type 2 (diabetes mellitus, type 2) ([place])    Environmental allergies    History of gastric ulcer    HLD (hyperlipidemia)    IDA (iron deficiency anemia)    chronic   Leukemia (HCC)    Leukemia, chronic myeloid (HCC)    Osteoarthritis    Panic disorder    PSS (progressive systemic sclerosis) ([place])    Unspecified essential hypertension    Past Surgical History:  Procedure Laterality Date   back and neck surgery  1999, 2009   had rods placed   [place] [facility]) No blackages found.    CARPAL TUNNEL RELEASE     PARTIAL HYSTERECTOMY     age 31, no cancer    Family History  Problem Relation Age of Onset   Diabetes Mother    Hypertension Mother    Heart disease Mother    Prostate cancer Father    Heart disease Father    Diabetes Brother     Social History   Socioeconomic History   Marital status: Married    Spouse name: Not on file   Number of children: Not on file   Years of education: Not on file   Highest education level: Not on file  Occupational History   Not on file  Tobacco Use   Smoking status: Never   Smokeless tobacco: Never   Tobacco comments:    quit 1985  Vaping Use   Vaping Use: Never used  Substance and Sexual Activity   Alcohol use: No    Alcohol/week: 0.0 standard drinks of alcohol   Drug use: No   Sexual activity: Not Currently  Other Topics Concern   Not on file  Social History Narrative   Not on file   Social Determinants of Health   Financial Resource Strain: Not on file  Food Insecurity: Not on file  Transportation Needs: Not on file  Physical Activity: Not on file  Stress: Not on file  Social Connections: Not on file  Intimate Partner Violence: Not on file    Current Outpatient Medications on File Prior to Visit   Medication Sig Dispense Refill   ACCU-CHEK GUIDE test strip TEST DAILY AND AS DIRECTED. 300 strip 10   Accu-Chek Softclix Lancets lancets TEST BLOOD SUGAR THREE TIMES DAILY AS INSTRUCTED 300 each 10   albuterol (PROVENTIL HFA;VENTOLIN HFA) 108 (90 BASE) MCG/ACT inhaler Inhale 2 puffs into the lungs every 6 (six) hours as needed for wheezing or shortness of breath.     albuterol (PROVENTIL) (2.5 MG/3ML) 0.083% nebulizer solution INHALE CONTENTS OF 1 VIAL VIA NEBULIZER THREE TIMES A DAY prn     Alcohol Swabs (DROPSAFE ALCOHOL PREP) 70 % PADS USE THREE TIMES DAILY  TO CHECK BLOOD SUGAR 300 each 10   ALPRAZolam (XANAX) 1 MG tablet Take 1 tablet (1 mg total) by mouth 3 (three) times daily. 90 tablet 5   amLODipine (NORVASC) 5 MG tablet Take 1 tablet (5 mg total) by mouth daily. 90 tablet 0   Ascorbic Acid (VITAMIN C) 1000 MG tablet Take 3,000 mg by mouth daily.     atorvastatin (LIPITOR) 20 MG tablet Take 1 tablet (20 mg total) by mouth daily. 90 tablet 3   baclofen (LIORESAL) 10 MG tablet Take 10 mg by mouth 2 (two) times daily. 1/2 tab     Blood Glucose Monitoring Suppl (GLUCOCOM BLOOD GLUCOSE MONITOR) DEVI Accu -chek 1 each 0   busPIRone (BUSPAR) 10 MG tablet Take 1 tablet (10 mg total) by mouth 3 (three) times daily. 90 tablet 5   celecoxib (CELEBREX) 100 MG capsule      cetirizine (ZYRTEC) 10 MG tablet Take 20 mg by mouth daily as needed.      Cholecalciferol (VITAMIN D PO) Take 1 tablet by mouth daily.     co-enzyme Q-10 50 MG capsule Take 50 mg by mouth daily.     Continuous Blood Gluc Receiver ([place]) DEVI 1 each by Does not apply route every 14 (fourteen) days. 1 each 3   Continuous Blood Gluc Sensor (DEXCOM G7 SENSOR) MISC 1 each by Does not apply route every 14 (fourteen) days. 12 each 1   diphenhydrAMINE (BENADRYL) 25 MG tablet Take 25 mg by mouth every 6 (six) hours as needed for itching or allergies.      DULoxetine (CYMBALTA) 20 MG capsule Take 1 capsule (20 mg total) by  mouth in the morning, at noon, in the evening, and at bedtime. 120 capsule 5   empagliflozin (JARDIANCE) 25 MG TABS tablet Take 1 tablet (25 mg total) by mouth daily before breakfast. 30 tablet 3   fluticasone (FLONASE) 50 MCG/ACT nasal spray Place 1-2 sprays into both nostrils as needed for rhinitis.      furosemide (LASIX) 40 MG tablet Take 1 tablet (40 mg total) by mouth daily. 30 tablet 3   HYDROmorphone (DILAUDID) 4 MG tablet Take 4 mg by mouth 3 (three) times daily as needed.  insulin aspart (NOVOLOG) 100 UNIT/ML injection Inject 10 Units into the skin 3 (three) times daily as needed for high blood sugar. Pt reports she is taking 2-8 units per sliding scale based on glucose     insulin glargine (LANTUS SOLOSTAR) 100 UNIT/ML Solostar Pen Inject 25 Units into the skin daily. 15 mL PRN   NOVOLOG FLEXPEN 100 UNIT/ML FlexPen Inject into the skin.     nystatin (MYCOSTATIN/NYSTOP) powder Apply 1 Application topically 3 (three) times daily. 60 g 2   nystatin cream (MYCOSTATIN) Apply 1 Application topically 2 (two) times daily. 30 g 0   Omega-3 Fatty Acids (OMEGA 3 PO) Take 3 capsules by mouth daily.     ondansetron (ZOFRAN-ODT) 4 MG disintegrating tablet Take 1 tablet (4 mg total) by mouth every 8 (eight) hours as needed for nausea or vomiting. 20 tablet 0   Oxycodone HCl 10 MG TABS Take 10 mg by mouth as needed.     pantoprazole (PROTONIX) 40 MG tablet TAKE 1 TABLET BY MOUTH EVERY DAY 90 tablet 1   polyethylene glycol (MIRALAX MIX-IN PAX) 17 g packet Take 17 g by mouth daily as needed. 14 each 0   prednisoLONE acetate (PRED FORTE) 1 % ophthalmic suspension SMARTSIG:In Eye(s)     pregabalin (LYRICA) 300 MG capsule Take 1 capsule (300 mg total) by mouth 2 (two) times daily. 60 capsule 5   Semaglutide, 1 MG/DOSE, 4 MG/3ML SOPN Inject 1 mg as directed once a week. 3 mL 0   sucralfate (CARAFATE) 1 GM/10ML suspension Take 10 mLs (1 g total) by mouth 4 (four) times daily. 420 mL 1   vitamin B-12  (CYANOCOBALAMIN) 1000 MCG tablet Take 4,000 mcg by mouth daily.     Current Facility-Administered Medications on File Prior to Visit  Medication Dose Route Frequency Provider Last Rate Last Admin   busPIRone (BUSPAR) tablet 10 mg  10 mg Oral TID Clapacs, Madie Reno, MD        Allergies  Allergen Reactions   Morphine Rash    Headache too   Codeine Other (See Comments)    Reaction: Unknown    Latex Other (See Comments)    Reaction:  Unknown    Morphine And Related Other (See Comments)    Reaction:  Unknown    Relafen [Nabumetone] Hives   Nsaids Rash   Tape Other (See Comments) and Rash    Reaction:  Unknown        Observations/Objective: There were no vitals filed for this visit.   There is no height or weight on file to calculate BMI.  Physical Exam Neurological:     Mental Status: She is alert.     LABS    Latest Ref Rng & Units 01/31/2022   12:02 PM 10/26/2021   12:17 PM 10/20/2021    7:25 PM  CBC  WBC 4.0 - 10.5 K/uL 6.1  6.7  10.9   Hemoglobin 12.0 - 15.0 g/dL 14.4  14.0  15.3   Hematocrit 36.0 - 46.0 % 43.9  42.5  47.7   Platelets 150 - 400 K/uL 243  278  325       Latest Ref Rng & Units 01/31/2022   12:02 PM 10/26/2021   12:17 PM 10/20/2021    7:29 PM  CMP  Glucose 70 - 99 mg/dL 165  158    BUN 8 - 23 mg/dL 11  18    Creatinine 0.44 - 1.00 mg/dL 1.12  1.24    Sodium 135 -  145 mmol/L 140  137    Potassium 3.5 - 5.1 mmol/L 4.1  3.9    Chloride 98 - 111 mmol/L 102  102    CO2 22 - 32 mmol/L 28  25    Calcium 8.9 - 10.3 mg/dL 9.6  8.8    Total Protein 6.5 - 8.1 g/dL 7.6  7.6  8.6   Total Bilirubin 0.3 - 1.2 mg/dL 0.5  0.2  0.8   Alkaline Phos 38 - 126 U/L 79  74  82   AST 15 - 41 U/L 32  31  31   ALT 0 - 44 U/L 27  31  33    ASSESSMENT & PLAN:   CML (chronic myelocytic leukemia) (HCC) #CML, chronic phase, 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 02/20/2021, BCR ABL 1 FISH is  negative.b3 a2 transcript 0.27%. 08/03/2021, BCR-ABL1 FISH is negative,b3 a2 transcript 0.0521%. 10/26/2021 BCR-ABL1 FISH is negative,b3 a2 transcript 0.0458% 01/31/2022 BCR-ABL1 FISH is negative,b3 a2 transcript 0.0251% Recommend patient to continue bosutinib,resume to '300mg'$  daily as her renal function has improved.  Refill prescription has been sent to pharmacy.  Encounter for antineoplastic chemotherapy Treatment plan as listed above.   Orders Placed This Encounter  Procedures   CBC with Differential/Platelet    Standing Status:   Future    Standing Expiration Date:   02/14/2023   Comprehensive metabolic panel    Standing Status:   Future    Standing Expiration Date:   02/14/2023   BCR-ABL1 FISH    Standing Status:   Future    Standing Expiration Date:   02/15/2023   BCR-ABL1, CML/ALL, PCR, QUANT    Standing Status:   Future    Standing Expiration Date:   02/15/2023   I discussed the assessment and treatment plan with the patient. The patient was provided an opportunity to ask questions and all were answered. The patient agreed with the plan and demonstrated an understanding of the instructions. The patient was advised to call back or seek an in-person evaluation if the symptoms worsen or if the condition fails to improve as anticipated.  All questions were answered.     Earlie Server, MD, PhD [facility] Hematology Oncology 02/14/2022

## 2022-02-19 ENCOUNTER — Other Ambulatory Visit: Payer: Self-pay

## 2022-02-19 ENCOUNTER — Other Ambulatory Visit (HOSPITAL_COMMUNITY): Payer: Self-pay

## 2022-02-20 ENCOUNTER — Other Ambulatory Visit: Payer: Self-pay

## 2022-02-20 DIAGNOSIS — M159 Polyosteoarthritis, unspecified: Secondary | ICD-10-CM | POA: Diagnosis not present

## 2022-02-20 DIAGNOSIS — M5137 Other intervertebral disc degeneration, lumbosacral region: Secondary | ICD-10-CM | POA: Diagnosis not present

## 2022-02-20 DIAGNOSIS — N644 Mastodynia: Secondary | ICD-10-CM | POA: Diagnosis not present

## 2022-02-20 DIAGNOSIS — Z6841 Body Mass Index (BMI) 40.0 and over, adult: Secondary | ICD-10-CM | POA: Diagnosis not present

## 2022-02-20 DIAGNOSIS — G4733 Obstructive sleep apnea (adult) (pediatric): Secondary | ICD-10-CM | POA: Diagnosis not present

## 2022-02-20 DIAGNOSIS — M65331 Trigger finger, right middle finger: Secondary | ICD-10-CM | POA: Diagnosis not present

## 2022-02-20 DIAGNOSIS — M5030 Other cervical disc degeneration, unspecified cervical region: Secondary | ICD-10-CM | POA: Diagnosis not present

## 2022-02-20 DIAGNOSIS — M3500 Sicca syndrome, unspecified: Secondary | ICD-10-CM | POA: Diagnosis not present

## 2022-02-20 DIAGNOSIS — Z794 Long term (current) use of insulin: Secondary | ICD-10-CM | POA: Diagnosis not present

## 2022-02-20 DIAGNOSIS — I5032 Chronic diastolic (congestive) heart failure: Secondary | ICD-10-CM | POA: Diagnosis not present

## 2022-02-20 DIAGNOSIS — E1165 Type 2 diabetes mellitus with hyperglycemia: Secondary | ICD-10-CM | POA: Diagnosis not present

## 2022-02-20 DIAGNOSIS — M2550 Pain in unspecified joint: Secondary | ICD-10-CM | POA: Diagnosis not present

## 2022-02-20 DIAGNOSIS — E538 Deficiency of other specified B group vitamins: Secondary | ICD-10-CM | POA: Diagnosis not present

## 2022-02-20 DIAGNOSIS — C9210 Chronic myeloid leukemia, BCR/ABL-positive, not having achieved remission: Secondary | ICD-10-CM | POA: Diagnosis not present

## 2022-02-21 ENCOUNTER — Other Ambulatory Visit: Payer: Self-pay | Admitting: Internal Medicine

## 2022-02-21 DIAGNOSIS — E1100 Type 2 diabetes mellitus with hyperosmolarity without nonketotic hyperglycemic-hyperosmolar coma (NKHHC): Secondary | ICD-10-CM

## 2022-02-21 DIAGNOSIS — N644 Mastodynia: Secondary | ICD-10-CM

## 2022-02-22 DIAGNOSIS — Z961 Presence of intraocular lens: Secondary | ICD-10-CM | POA: Diagnosis not present

## 2022-02-22 DIAGNOSIS — Z0100 Encounter for examination of eyes and vision without abnormal findings: Secondary | ICD-10-CM | POA: Diagnosis not present

## 2022-02-22 DIAGNOSIS — E119 Type 2 diabetes mellitus without complications: Secondary | ICD-10-CM | POA: Diagnosis not present

## 2022-02-28 ENCOUNTER — Other Ambulatory Visit: Payer: Self-pay | Admitting: Internal Medicine

## 2022-02-28 DIAGNOSIS — N644 Mastodynia: Secondary | ICD-10-CM

## 2022-03-08 DIAGNOSIS — M545 Low back pain, unspecified: Secondary | ICD-10-CM | POA: Diagnosis not present

## 2022-03-08 DIAGNOSIS — M25561 Pain in right knee: Secondary | ICD-10-CM | POA: Diagnosis not present

## 2022-03-08 DIAGNOSIS — M25562 Pain in left knee: Secondary | ICD-10-CM | POA: Diagnosis not present

## 2022-03-08 DIAGNOSIS — G8929 Other chronic pain: Secondary | ICD-10-CM | POA: Diagnosis not present

## 2022-03-13 ENCOUNTER — Other Ambulatory Visit (HOSPITAL_COMMUNITY): Payer: Self-pay

## 2022-03-13 NOTE — Telephone Encounter (Signed)
Oral Oncology Patient Advocate Encounter   Received notification re-enrollment for assistance for Bosulif through [employer] has been approved. Patient may continue to receive their medication at $0 from this program.    [employer]'s phone number [phone].   Effective dates: 03/04/22 through 02/25/23  I have spoken to the patient.  Berdine Addison, [facility] Patient [place]  [phone] (phone) [phone] (fax) 03/13/2022 1:44 PM

## 2022-03-14 ENCOUNTER — Other Ambulatory Visit (HOSPITAL_COMMUNITY): Payer: Self-pay

## 2022-03-14 ENCOUNTER — Other Ambulatory Visit: Payer: Self-pay | Admitting: Internal Medicine

## 2022-03-15 ENCOUNTER — Other Ambulatory Visit (HOSPITAL_COMMUNITY): Payer: Self-pay

## 2022-03-15 ENCOUNTER — Other Ambulatory Visit: Payer: Medicare HMO

## 2022-03-15 ENCOUNTER — Inpatient Hospital Stay: Admission: RE | Admit: 2022-03-15 | Payer: Medicare HMO | Source: Ambulatory Visit

## 2022-03-21 DIAGNOSIS — E538 Deficiency of other specified B group vitamins: Secondary | ICD-10-CM | POA: Diagnosis not present

## 2022-03-21 DIAGNOSIS — J9601 Acute respiratory failure with hypoxia: Secondary | ICD-10-CM | POA: Diagnosis not present

## 2022-03-21 DIAGNOSIS — I130 Hypertensive heart and chronic kidney disease with heart failure and stage 1 through stage 4 chronic kidney disease, or unspecified chronic kidney disease: Secondary | ICD-10-CM | POA: Diagnosis not present

## 2022-03-21 DIAGNOSIS — N959 Unspecified menopausal and perimenopausal disorder: Secondary | ICD-10-CM | POA: Diagnosis not present

## 2022-03-21 DIAGNOSIS — E1122 Type 2 diabetes mellitus with diabetic chronic kidney disease: Secondary | ICD-10-CM | POA: Diagnosis not present

## 2022-03-21 DIAGNOSIS — C9210 Chronic myeloid leukemia, BCR/ABL-positive, not having achieved remission: Secondary | ICD-10-CM | POA: Diagnosis not present

## 2022-03-21 DIAGNOSIS — G4733 Obstructive sleep apnea (adult) (pediatric): Secondary | ICD-10-CM | POA: Diagnosis not present

## 2022-03-21 DIAGNOSIS — N1831 Chronic kidney disease, stage 3a: Secondary | ICD-10-CM | POA: Diagnosis not present

## 2022-03-21 DIAGNOSIS — I5032 Chronic diastolic (congestive) heart failure: Secondary | ICD-10-CM | POA: Diagnosis not present

## 2022-03-22 ENCOUNTER — Other Ambulatory Visit: Payer: Self-pay

## 2022-03-22 DIAGNOSIS — M5030 Other cervical disc degeneration, unspecified cervical region: Secondary | ICD-10-CM | POA: Diagnosis not present

## 2022-03-22 DIAGNOSIS — M159 Polyosteoarthritis, unspecified: Secondary | ICD-10-CM | POA: Diagnosis not present

## 2022-03-22 DIAGNOSIS — M5136 Other intervertebral disc degeneration, lumbar region: Secondary | ICD-10-CM | POA: Diagnosis not present

## 2022-03-24 ENCOUNTER — Other Ambulatory Visit: Payer: Self-pay | Admitting: Internal Medicine

## 2022-03-24 DIAGNOSIS — E7800 Pure hypercholesterolemia, unspecified: Secondary | ICD-10-CM

## 2022-04-01 DIAGNOSIS — N1831 Chronic kidney disease, stage 3a: Secondary | ICD-10-CM | POA: Diagnosis not present

## 2022-04-01 DIAGNOSIS — E538 Deficiency of other specified B group vitamins: Secondary | ICD-10-CM | POA: Diagnosis not present

## 2022-04-01 DIAGNOSIS — E1122 Type 2 diabetes mellitus with diabetic chronic kidney disease: Secondary | ICD-10-CM | POA: Diagnosis not present

## 2022-04-01 DIAGNOSIS — N959 Unspecified menopausal and perimenopausal disorder: Secondary | ICD-10-CM | POA: Diagnosis not present

## 2022-04-01 DIAGNOSIS — G4733 Obstructive sleep apnea (adult) (pediatric): Secondary | ICD-10-CM | POA: Diagnosis not present

## 2022-04-01 DIAGNOSIS — I5032 Chronic diastolic (congestive) heart failure: Secondary | ICD-10-CM | POA: Diagnosis not present

## 2022-04-01 DIAGNOSIS — C9210 Chronic myeloid leukemia, BCR/ABL-positive, not having achieved remission: Secondary | ICD-10-CM | POA: Diagnosis not present

## 2022-04-01 DIAGNOSIS — J9601 Acute respiratory failure with hypoxia: Secondary | ICD-10-CM | POA: Diagnosis not present

## 2022-04-01 DIAGNOSIS — I130 Hypertensive heart and chronic kidney disease with heart failure and stage 1 through stage 4 chronic kidney disease, or unspecified chronic kidney disease: Secondary | ICD-10-CM | POA: Diagnosis not present

## 2022-04-02 DIAGNOSIS — I5032 Chronic diastolic (congestive) heart failure: Secondary | ICD-10-CM | POA: Diagnosis not present

## 2022-04-02 DIAGNOSIS — N959 Unspecified menopausal and perimenopausal disorder: Secondary | ICD-10-CM | POA: Diagnosis not present

## 2022-04-02 DIAGNOSIS — C9210 Chronic myeloid leukemia, BCR/ABL-positive, not having achieved remission: Secondary | ICD-10-CM | POA: Diagnosis not present

## 2022-04-02 DIAGNOSIS — E1122 Type 2 diabetes mellitus with diabetic chronic kidney disease: Secondary | ICD-10-CM | POA: Diagnosis not present

## 2022-04-02 DIAGNOSIS — I130 Hypertensive heart and chronic kidney disease with heart failure and stage 1 through stage 4 chronic kidney disease, or unspecified chronic kidney disease: Secondary | ICD-10-CM | POA: Diagnosis not present

## 2022-04-02 DIAGNOSIS — E538 Deficiency of other specified B group vitamins: Secondary | ICD-10-CM | POA: Diagnosis not present

## 2022-04-02 DIAGNOSIS — N1831 Chronic kidney disease, stage 3a: Secondary | ICD-10-CM | POA: Diagnosis not present

## 2022-04-03 DIAGNOSIS — E1122 Type 2 diabetes mellitus with diabetic chronic kidney disease: Secondary | ICD-10-CM | POA: Diagnosis not present

## 2022-04-03 DIAGNOSIS — N959 Unspecified menopausal and perimenopausal disorder: Secondary | ICD-10-CM | POA: Diagnosis not present

## 2022-04-03 DIAGNOSIS — N1831 Chronic kidney disease, stage 3a: Secondary | ICD-10-CM | POA: Diagnosis not present

## 2022-04-03 DIAGNOSIS — E538 Deficiency of other specified B group vitamins: Secondary | ICD-10-CM | POA: Diagnosis not present

## 2022-04-03 DIAGNOSIS — I5032 Chronic diastolic (congestive) heart failure: Secondary | ICD-10-CM | POA: Diagnosis not present

## 2022-04-03 DIAGNOSIS — C9210 Chronic myeloid leukemia, BCR/ABL-positive, not having achieved remission: Secondary | ICD-10-CM | POA: Diagnosis not present

## 2022-04-03 DIAGNOSIS — I130 Hypertensive heart and chronic kidney disease with heart failure and stage 1 through stage 4 chronic kidney disease, or unspecified chronic kidney disease: Secondary | ICD-10-CM | POA: Diagnosis not present

## 2022-04-12 DIAGNOSIS — E119 Type 2 diabetes mellitus without complications: Secondary | ICD-10-CM | POA: Diagnosis not present

## 2022-04-12 DIAGNOSIS — J019 Acute sinusitis, unspecified: Secondary | ICD-10-CM | POA: Diagnosis not present

## 2022-04-12 DIAGNOSIS — I130 Hypertensive heart and chronic kidney disease with heart failure and stage 1 through stage 4 chronic kidney disease, or unspecified chronic kidney disease: Secondary | ICD-10-CM | POA: Diagnosis not present

## 2022-04-12 DIAGNOSIS — B9689 Other specified bacterial agents as the cause of diseases classified elsewhere: Secondary | ICD-10-CM | POA: Diagnosis not present

## 2022-04-12 DIAGNOSIS — N1831 Chronic kidney disease, stage 3a: Secondary | ICD-10-CM | POA: Diagnosis not present

## 2022-04-12 DIAGNOSIS — I509 Heart failure, unspecified: Secondary | ICD-10-CM | POA: Diagnosis not present

## 2022-04-12 DIAGNOSIS — E1122 Type 2 diabetes mellitus with diabetic chronic kidney disease: Secondary | ICD-10-CM | POA: Diagnosis not present

## 2022-04-12 DIAGNOSIS — Z794 Long term (current) use of insulin: Secondary | ICD-10-CM | POA: Diagnosis not present

## 2022-04-12 DIAGNOSIS — C9210 Chronic myeloid leukemia, BCR/ABL-positive, not having achieved remission: Secondary | ICD-10-CM | POA: Diagnosis not present

## 2022-04-16 NOTE — Progress Notes (Signed)
Nutrition  Messaged received from patient requesting diet information on stage 3 kidney disease.   Chart reviewed.  Followed by Dr Tasia Catchings for [facility].  Called patient but no answer.  Left message that RD would be mailing out information and to call with questions.  Contact information included.    Laurine Kuyper B. Zenia Resides, [place], [place] Registered Dietitian [phone]

## 2022-04-22 DIAGNOSIS — J9601 Acute respiratory failure with hypoxia: Secondary | ICD-10-CM | POA: Diagnosis not present

## 2022-04-22 DIAGNOSIS — E1122 Type 2 diabetes mellitus with diabetic chronic kidney disease: Secondary | ICD-10-CM | POA: Diagnosis not present

## 2022-04-22 DIAGNOSIS — G4733 Obstructive sleep apnea (adult) (pediatric): Secondary | ICD-10-CM | POA: Diagnosis not present

## 2022-04-22 DIAGNOSIS — N959 Unspecified menopausal and perimenopausal disorder: Secondary | ICD-10-CM | POA: Diagnosis not present

## 2022-04-22 DIAGNOSIS — I130 Hypertensive heart and chronic kidney disease with heart failure and stage 1 through stage 4 chronic kidney disease, or unspecified chronic kidney disease: Secondary | ICD-10-CM | POA: Diagnosis not present

## 2022-04-22 DIAGNOSIS — C9210 Chronic myeloid leukemia, BCR/ABL-positive, not having achieved remission: Secondary | ICD-10-CM | POA: Diagnosis not present

## 2022-04-22 DIAGNOSIS — E538 Deficiency of other specified B group vitamins: Secondary | ICD-10-CM | POA: Diagnosis not present

## 2022-04-22 DIAGNOSIS — I5032 Chronic diastolic (congestive) heart failure: Secondary | ICD-10-CM | POA: Diagnosis not present

## 2022-04-22 DIAGNOSIS — N1831 Chronic kidney disease, stage 3a: Secondary | ICD-10-CM | POA: Diagnosis not present

## 2022-04-26 DIAGNOSIS — R32 Unspecified urinary incontinence: Secondary | ICD-10-CM | POA: Diagnosis not present

## 2022-05-03 DIAGNOSIS — G8929 Other chronic pain: Secondary | ICD-10-CM | POA: Diagnosis not present

## 2022-05-03 DIAGNOSIS — M545 Low back pain, unspecified: Secondary | ICD-10-CM | POA: Diagnosis not present

## 2022-05-06 DIAGNOSIS — N959 Unspecified menopausal and perimenopausal disorder: Secondary | ICD-10-CM | POA: Diagnosis not present

## 2022-05-06 DIAGNOSIS — J9601 Acute respiratory failure with hypoxia: Secondary | ICD-10-CM | POA: Diagnosis not present

## 2022-05-06 DIAGNOSIS — G4733 Obstructive sleep apnea (adult) (pediatric): Secondary | ICD-10-CM | POA: Diagnosis not present

## 2022-05-06 DIAGNOSIS — E1122 Type 2 diabetes mellitus with diabetic chronic kidney disease: Secondary | ICD-10-CM | POA: Diagnosis not present

## 2022-05-06 DIAGNOSIS — E538 Deficiency of other specified B group vitamins: Secondary | ICD-10-CM | POA: Diagnosis not present

## 2022-05-06 DIAGNOSIS — I130 Hypertensive heart and chronic kidney disease with heart failure and stage 1 through stage 4 chronic kidney disease, or unspecified chronic kidney disease: Secondary | ICD-10-CM | POA: Diagnosis not present

## 2022-05-06 DIAGNOSIS — I5032 Chronic diastolic (congestive) heart failure: Secondary | ICD-10-CM | POA: Diagnosis not present

## 2022-05-06 DIAGNOSIS — C9210 Chronic myeloid leukemia, BCR/ABL-positive, not having achieved remission: Secondary | ICD-10-CM | POA: Diagnosis not present

## 2022-05-06 DIAGNOSIS — N1831 Chronic kidney disease, stage 3a: Secondary | ICD-10-CM | POA: Diagnosis not present

## 2022-05-16 ENCOUNTER — Telehealth: Payer: Self-pay

## 2022-05-16 ENCOUNTER — Other Ambulatory Visit: Payer: Medicare HMO

## 2022-05-16 NOTE — Telephone Encounter (Signed)
Pt scheduled for labs on 3/22. Please move mychart visit on 3/25 to be at least a week after labs, to allow time for labs to result. Ok to move [employer] visit to after MD comes back from vacation.

## 2022-05-17 ENCOUNTER — Inpatient Hospital Stay: Payer: Medicare HMO | Attending: Oncology

## 2022-05-17 DIAGNOSIS — Z7984 Long term (current) use of oral hypoglycemic drugs: Secondary | ICD-10-CM | POA: Diagnosis not present

## 2022-05-17 DIAGNOSIS — Z794 Long term (current) use of insulin: Secondary | ICD-10-CM | POA: Diagnosis not present

## 2022-05-17 DIAGNOSIS — E785 Hyperlipidemia, unspecified: Secondary | ICD-10-CM | POA: Diagnosis not present

## 2022-05-17 DIAGNOSIS — C9212 Chronic myeloid leukemia, BCR/ABL-positive, in relapse: Secondary | ICD-10-CM | POA: Insufficient documentation

## 2022-05-17 DIAGNOSIS — E119 Type 2 diabetes mellitus without complications: Secondary | ICD-10-CM | POA: Insufficient documentation

## 2022-05-17 DIAGNOSIS — I10 Essential (primary) hypertension: Secondary | ICD-10-CM | POA: Insufficient documentation

## 2022-05-17 DIAGNOSIS — C9210 Chronic myeloid leukemia, BCR/ABL-positive, not having achieved remission: Secondary | ICD-10-CM

## 2022-05-17 DIAGNOSIS — Z79899 Other long term (current) drug therapy: Secondary | ICD-10-CM | POA: Insufficient documentation

## 2022-05-17 LAB — COMPREHENSIVE METABOLIC PANEL
ALT: 21 U/L (ref 0–44)
AST: 25 U/L (ref 15–41)
Albumin: 3.6 g/dL (ref 3.5–5.0)
Alkaline Phosphatase: 87 U/L (ref 38–126)
Anion gap: 7 (ref 5–15)
BUN: 15 mg/dL (ref 8–23)
CO2: 26 mmol/L (ref 22–32)
Calcium: 9.2 mg/dL (ref 8.9–10.3)
Chloride: 101 mmol/L (ref 98–111)
Creatinine, Ser: 0.95 mg/dL (ref 0.44–1.00)
GFR, Estimated: >60 mL/min (ref >60)
Glucose, Bld: 230 mg/dL — ABNORMAL HIGH (ref 70–99)
Potassium: 4.2 mmol/L (ref 3.5–5.1)
Sodium: 134 mmol/L — ABNORMAL LOW (ref 135–145)
Total Bilirubin: 0.6 mg/dL (ref 0.3–1.2)
Total Protein: 7.2 g/dL (ref 6.5–8.1)

## 2022-05-17 LAB — CBC WITH DIFFERENTIAL/PLATELET
Abs Immature Granulocytes: 0.03 10*3/uL (ref 0.00–0.07)
Basophils Absolute: 0 10*3/uL (ref 0.0–0.1)
Basophils Relative: 0 %
Eosinophils Absolute: 0.1 10*3/uL (ref 0.0–0.5)
Eosinophils Relative: 1 %
HCT: 40.5 % (ref 36.0–46.0)
Hemoglobin: 13.2 g/dL (ref 12.0–15.0)
Immature Granulocytes: 0 %
Lymphocytes Relative: 11 %
Lymphs Abs: 1.1 10*3/uL (ref 0.7–4.0)
MCH: 27.7 pg (ref 26.0–34.0)
MCHC: 32.6 g/dL (ref 30.0–36.0)
MCV: 85.1 fL (ref 80.0–100.0)
Monocytes Absolute: 0.5 10*3/uL (ref 0.1–1.0)
Monocytes Relative: 5 %
Neutro Abs: 8.5 10*3/uL — ABNORMAL HIGH (ref 1.7–7.7)
Neutrophils Relative %: 83 %
Platelets: 298 10*3/uL (ref 150–400)
RBC: 4.76 MIL/uL (ref 3.87–5.11)
RDW: 13.2 % (ref 11.5–15.5)
WBC: 10.3 10*3/uL (ref 4.0–10.5)
nRBC: 0 % (ref 0.0–0.2)

## 2022-05-20 ENCOUNTER — Inpatient Hospital Stay: Payer: Medicare HMO | Admitting: Oncology

## 2022-05-21 ENCOUNTER — Inpatient Hospital Stay: Payer: Medicare HMO | Admitting: Oncology

## 2022-05-22 LAB — BCR-ABL1 FISH
Cells Analyzed: 200
Cells Counted: 200

## 2022-05-23 ENCOUNTER — Other Ambulatory Visit (HOSPITAL_COMMUNITY): Payer: Self-pay

## 2022-05-23 ENCOUNTER — Other Ambulatory Visit: Payer: Self-pay

## 2022-05-23 ENCOUNTER — Encounter: Payer: Self-pay | Admitting: Oncology

## 2022-05-23 ENCOUNTER — Inpatient Hospital Stay (HOSPITAL_BASED_OUTPATIENT_CLINIC_OR_DEPARTMENT_OTHER): Payer: Medicare HMO | Admitting: Oncology

## 2022-05-23 DIAGNOSIS — C9210 Chronic myeloid leukemia, BCR/ABL-positive, not having achieved remission: Secondary | ICD-10-CM

## 2022-05-23 DIAGNOSIS — I509 Heart failure, unspecified: Secondary | ICD-10-CM | POA: Diagnosis not present

## 2022-05-23 DIAGNOSIS — Z5111 Encounter for antineoplastic chemotherapy: Secondary | ICD-10-CM

## 2022-05-23 DIAGNOSIS — J449 Chronic obstructive pulmonary disease, unspecified: Secondary | ICD-10-CM | POA: Diagnosis not present

## 2022-05-23 MED ORDER — BOSUTINIB 100 MG PO TABS
400.0000 mg | ORAL_TABLET | Freq: Every day | ORAL | 2 refills | Status: DC
  Filled 2022-05-23: qty 120, 30d supply, fill #0

## 2022-05-23 NOTE — Progress Notes (Signed)
HEMATOLOGY-ONCOLOGY TeleHEALTH VISIT PROGRESS NOTE  I connected with Tara Levine on 05/23/22  at  2:45 PM EDT by video enabled telemedicine visit and verified that I am speaking with the correct person using two identifiers. I discussed the limitations, risks, security and privacy concerns of performing an evaluation and management service by telemedicine and the availability of in-person appointments. The patient expressed understanding and agreed to proceed.   Other persons participating in the visit and their role in the encounter:  None  Patient's location: Home  Provider's location: office Chief Complaint: Tara Levine is a 67 y.o. female who has above history reviewed by me today presents virtually for follow up visit for management of CML Patients requests virtual visits due to difficulty in ambulating and not able to come for in person follow up  I discussed the limitations of evaluation and management by telemedicine. The patient expressed understanding and agreed to proceed. She takes Bosutinib 400mg  daily.     Review of Systems  Constitutional:  Positive for fatigue. Negative for chills and fever.  HENT:   Negative for hearing loss and voice change.   Eyes:  Negative for eye problems.  Respiratory:  Negative for chest tightness, cough and shortness of breath.   Cardiovascular:  Negative for chest pain.  Gastrointestinal:  Negative for abdominal distention, abdominal pain, blood in stool and nausea.  Endocrine: Negative for hot flashes.  Genitourinary:  Negative for difficulty urinating, frequency and hematuria.   Musculoskeletal:  Negative for arthralgias and flank pain.  Skin:  Negative for itching and rash.  Neurological:  Negative for extremity weakness.  Hematological:  Negative for adenopathy.  Psychiatric/Behavioral:  Negative for confusion. The patient is not nervous/anxious.     Past Medical History:  Diagnosis Date   CML (chronic  myelocytic leukemia) ([place])    Depression    DM type 2 (diabetes mellitus, type 2) ([place])    Environmental allergies    History of gastric ulcer    HLD (hyperlipidemia)    IDA (iron deficiency anemia)    chronic   Leukemia (HCC)    Leukemia, chronic myeloid (HCC)    Osteoarthritis    Panic disorder    PSS (progressive systemic sclerosis) ([place])    Unspecified essential hypertension    Past Surgical History:  Procedure Laterality Date   back and neck surgery  1999, 2009   had rods placed   [place] [facility]) No blackages found.    CARPAL TUNNEL RELEASE     PARTIAL HYSTERECTOMY     age 52, no cancer    Family History  Problem Relation Age of Onset   Diabetes Mother    Hypertension Mother    Heart disease Mother    Prostate cancer Father    Heart disease Father    Diabetes Brother     Social History   Socioeconomic History   Marital status: Married    Spouse name: Not on file   Number of children: Not on file   Years of education: Not on file   Highest education level: Not on file  Occupational History   Not on file  Tobacco Use   Smoking status: Never   Smokeless tobacco: Never   Tobacco comments:    quit 1985  Vaping Use   Vaping Use: Never used  Substance and Sexual Activity   Alcohol use: No    Alcohol/week: 0.0 standard drinks of alcohol  Drug use: No   Sexual activity: Not Currently  Other Topics Concern   Not on file  Social History Narrative   Not on file   Social Determinants of Health   Financial Resource Strain: Not on file  Food Insecurity: Not on file  Transportation Needs: Not on file  Physical Activity: Not on file  Stress: Not on file  Social Connections: Not on file  Intimate Partner Violence: Not on file    Current Outpatient Medications on File Prior to Visit  Medication Sig Dispense Refill   ACCU-CHEK GUIDE test strip TEST DAILY AND AS DIRECTED. 300 strip 10   Accu-Chek Softclix  Lancets lancets TEST BLOOD SUGAR THREE TIMES DAILY AS INSTRUCTED 300 each 10   albuterol (PROVENTIL HFA;VENTOLIN HFA) 108 (90 BASE) MCG/ACT inhaler Inhale 2 puffs into the lungs every 6 (six) hours as needed for wheezing or shortness of breath.     albuterol (PROVENTIL) (2.5 MG/3ML) 0.083% nebulizer solution INHALE CONTENTS OF 1 VIAL VIA NEBULIZER THREE TIMES A DAY prn     Alcohol Swabs (DROPSAFE ALCOHOL PREP) 70 % PADS USE THREE TIMES DAILY  TO CHECK BLOOD SUGAR 300 each 10   ALPRAZolam (XANAX) 1 MG tablet Take 1 tablet (1 mg total) by mouth 3 (three) times daily. 90 tablet 5   amLODipine (NORVASC) 5 MG tablet Take 1 tablet (5 mg total) by mouth daily. 90 tablet 0   Ascorbic Acid (VITAMIN C) 1000 MG tablet Take 3,000 mg by mouth daily.     atorvastatin (LIPITOR) 20 MG tablet Take 1 tablet (20 mg total) by mouth daily. 90 tablet 3   baclofen (LIORESAL) 10 MG tablet Take 10 mg by mouth 2 (two) times daily. 1/2 tab     Blood Glucose Monitoring Suppl (GLUCOCOM BLOOD GLUCOSE MONITOR) DEVI Accu -chek 1 each 0   celecoxib (CELEBREX) 100 MG capsule      cetirizine (ZYRTEC) 10 MG tablet Take 20 mg by mouth daily as needed.      Cholecalciferol (VITAMIN D PO) Take 1 tablet by mouth daily.     co-enzyme Q-10 50 MG capsule Take 50 mg by mouth daily.     Continuous Blood Gluc Receiver ([place]) DEVI 1 each by Does not apply route every 14 (fourteen) days. 1 each 3   Continuous Blood Gluc Sensor (DEXCOM G7 SENSOR) MISC 1 each by Does not apply route every 14 (fourteen) days. 12 each 1   diphenhydrAMINE (BENADRYL) 25 MG tablet Take 25 mg by mouth every 6 (six) hours as needed for itching or allergies.      DULoxetine (CYMBALTA) 20 MG capsule Take 1 capsule (20 mg total) by mouth in the morning, at noon, in the evening, and at bedtime. 120 capsule 5   empagliflozin (JARDIANCE) 25 MG TABS tablet Take 1 tablet (25 mg total) by mouth daily before breakfast. 30 tablet 3   fluticasone (FLONASE) 50 MCG/ACT  nasal spray Place 1-2 sprays into both nostrils as needed for rhinitis.      furosemide (LASIX) 40 MG tablet Take 1 tablet (40 mg total) by mouth daily. 30 tablet 3   insulin aspart (NOVOLOG) 100 UNIT/ML injection Inject 10 Units into the skin 3 (three) times daily as needed for high blood sugar. Pt reports she is taking 2-8 units per sliding scale based on glucose     NOVOLOG FLEXPEN 100 UNIT/ML FlexPen Inject into the skin.     nystatin (MYCOSTATIN/NYSTOP) powder Apply 1 Application topically 3 (three) times  daily. 60 g 2   Omega-3 Fatty Acids (OMEGA 3 PO) Take 3 capsules by mouth daily.     ondansetron (ZOFRAN-ODT) 4 MG disintegrating tablet Take 1 tablet (4 mg total) by mouth every 8 (eight) hours as needed for nausea or vomiting. 20 tablet 0   Oxycodone HCl 10 MG TABS Take 10 mg by mouth as needed.     pantoprazole (PROTONIX) 40 MG tablet TAKE 1 TABLET BY MOUTH EVERY DAY 90 tablet 1   pregabalin (LYRICA) 300 MG capsule Take 1 capsule (300 mg total) by mouth 2 (two) times daily. 60 capsule 5   Semaglutide, 1 MG/DOSE, 4 MG/3ML SOPN Inject 1 mg as directed once a week. 3 mL 0   sucralfate (CARAFATE) 1 GM/10ML suspension Take 10 mLs (1 g total) by mouth 4 (four) times daily. 420 mL 1   vitamin B-12 (CYANOCOBALAMIN) 1000 MCG tablet Take 4,000 mcg by mouth daily.     HYDROmorphone (DILAUDID) 4 MG tablet Take 4 mg by mouth 3 (three) times daily as needed. (Patient not taking: Reported on 05/23/2022)     polyethylene glycol (MIRALAX MIX-IN PAX) 17 g packet Take 17 g by mouth daily as needed. (Patient not taking: Reported on 05/23/2022) 14 each 0   Current Facility-Administered Medications on File Prior to Visit  Medication Dose Route Frequency Provider Last Rate Last Admin   busPIRone (BUSPAR) tablet 10 mg  10 mg Oral TID Clapacs, Madie Reno, MD        Allergies  Allergen Reactions   Morphine Rash    Headache too   Codeine Other (See Comments)    Reaction: Unknown    Latex Other (See Comments)     Reaction:  Unknown    Morphine And Related Other (See Comments)    Reaction:  Unknown    Relafen [Nabumetone] Hives   Nsaids Rash   Tape Other (See Comments) and Rash    Reaction:  Unknown        Observations/Objective: There were no vitals filed for this visit.   There is no height or weight on file to calculate BMI.  Physical Exam Neurological:     Mental Status: She is alert.     LABS    Latest Ref Rng & Units 05/17/2022   12:12 PM 01/31/2022   12:02 PM 10/26/2021   12:17 PM  CBC  WBC 4.0 - 10.5 K/uL 10.3  6.1  6.7   Hemoglobin 12.0 - 15.0 g/dL 13.2  14.4  14.0   Hematocrit 36.0 - 46.0 % 40.5  43.9  42.5   Platelets 150 - 400 K/uL 298  243  278       Latest Ref Rng & Units 05/17/2022   12:12 PM 01/31/2022   12:02 PM 10/26/2021   12:17 PM  CMP  Glucose 70 - 99 mg/dL 230  165  158   BUN 8 - 23 mg/dL 15  11  18    Creatinine 0.44 - 1.00 mg/dL 0.95  1.12  1.24   Sodium 135 - 145 mmol/L 134  140  137   Potassium 3.5 - 5.1 mmol/L 4.2  4.1  3.9   Chloride 98 - 111 mmol/L 101  102  102   CO2 22 - 32 mmol/L 26  28  25    Calcium 8.9 - 10.3 mg/dL 9.2  9.6  8.8   Total Protein 6.5 - 8.1 g/dL 7.2  7.6  7.6   Total Bilirubin 0.3 - 1.2 mg/dL 0.6  0.5  0.2   Alkaline Phos 38 - 126 U/L 87  79  74   AST 15 - 41 U/L 25  32  31   ALT 0 - 44 U/L 21  27  31     ASSESSMENT & PLAN:   CML (chronic myelocytic leukemia) (HCC) #CML, chronic phase, 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 02/20/2021, BCR ABL 1 FISH is negative.b3 a2 transcript 0.27%. 08/03/2021, BCR-ABL1 FISH is negative,b3 a2 transcript 0.0521%. 10/26/2021 BCR-ABL1 FISH is negative,b3 a2 transcript 0.0458% 01/31/2022 BCR-ABL1 FISH is negative,b3 a2 transcript 0.0251% 05/17/2022 BCR-ABL1 FISH is negative,b3 a2 transcript pending Recommend patient to continue bosutinib,continue 400mg  daily as her renal function has improved.  Refill prescription has  been sent to pharmacy.  Encounter for antineoplastic chemotherapy Treatment plan as listed above.   Orders Placed This Encounter  Procedures   CBC with Differential ([place] Only)    Standing Status:   Future    Standing Expiration Date:   05/23/2023   CMP ([place] only)    Standing Status:   Future    Standing Expiration Date:   05/23/2023   BCR-ABL1, CML/ALL, PCR, QUANT    Standing Status:   Future    Standing Expiration Date:   05/23/2023   BCR-ABL1 FISH    Standing Status:   Future    Standing Expiration Date:   05/23/2023   I discussed the assessment and treatment plan with the patient. The patient was provided an opportunity to ask questions and all were answered. The patient agreed with the plan and demonstrated an understanding of the instructions. The patient was advised to call back or seek an in-person evaluation if the symptoms worsen or if the condition fails to improve as anticipated.  All questions were answered.   Follow up in 3 months, patient understands the limitation of virtual visit and requests virtual visit due to transportation difficulty.    Earlie Server, MD, PhD [facility] Hematology Oncology 05/23/2022

## 2022-05-23 NOTE — Assessment & Plan Note (Signed)
Treatment plan as listed above. 

## 2022-05-23 NOTE — Assessment & Plan Note (Addendum)
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 02/20/2021, BCR ABL 1 FISH is negative.b3 a2 transcript 0.27%. 08/03/2021, BCR-ABL1 FISH is negative,b3 a2 transcript 0.0521%. 10/26/2021 BCR-ABL1 FISH is negative,b3 a2 transcript 0.0458% 01/31/2022 BCR-ABL1 FISH is negative,b3 a2 transcript 0.0251% 05/17/2022 BCR-ABL1 FISH is negative,b3 a2 transcript pending Recommend patient to continue bosutinib,continue 400mg  daily as her renal function has improved.  Refill prescription has been sent to pharmacy.

## 2022-05-23 NOTE — Progress Notes (Signed)
Pt contacted for Mychart visit.

## 2022-05-24 ENCOUNTER — Other Ambulatory Visit: Payer: Self-pay | Admitting: Pharmacist

## 2022-05-24 ENCOUNTER — Other Ambulatory Visit: Payer: Self-pay

## 2022-05-24 DIAGNOSIS — C9210 Chronic myeloid leukemia, BCR/ABL-positive, not having achieved remission: Secondary | ICD-10-CM

## 2022-05-24 MED ORDER — BOSUTINIB 100 MG PO TABS: 400.0000 mg | ORAL_TABLET | Freq: Every day | ORAL | 2 refills | Status: DC

## 2022-05-24 NOTE — Progress Notes (Signed)
Prescription redirected to patient's dispensing pharmacy [facility]

## 2022-05-29 LAB — BCR-ABL1, CML/ALL, PCR, QUANT: b3a2 transcript: 0.041 %

## 2022-06-03 DIAGNOSIS — Y92009 Unspecified place in unspecified non-institutional (private) residence as the place of occurrence of the external cause: Secondary | ICD-10-CM | POA: Diagnosis not present

## 2022-06-03 DIAGNOSIS — M170 Bilateral primary osteoarthritis of knee: Secondary | ICD-10-CM | POA: Diagnosis not present

## 2022-06-03 DIAGNOSIS — M25512 Pain in left shoulder: Secondary | ICD-10-CM | POA: Diagnosis not present

## 2022-06-03 DIAGNOSIS — M11261 Other chondrocalcinosis, right knee: Secondary | ICD-10-CM | POA: Diagnosis not present

## 2022-06-03 DIAGNOSIS — M542 Cervicalgia: Secondary | ICD-10-CM | POA: Diagnosis not present

## 2022-06-03 DIAGNOSIS — M25561 Pain in right knee: Secondary | ICD-10-CM | POA: Diagnosis not present

## 2022-06-03 DIAGNOSIS — W010XXA Fall on same level from slipping, tripping and stumbling without subsequent striking against object, initial encounter: Secondary | ICD-10-CM | POA: Diagnosis not present

## 2022-06-03 DIAGNOSIS — M8588 Other specified disorders of bone density and structure, other site: Secondary | ICD-10-CM | POA: Diagnosis not present

## 2022-06-03 DIAGNOSIS — M545 Low back pain, unspecified: Secondary | ICD-10-CM | POA: Diagnosis not present

## 2022-06-03 DIAGNOSIS — M25562 Pain in left knee: Secondary | ICD-10-CM | POA: Diagnosis not present

## 2022-06-14 DIAGNOSIS — R202 Paresthesia of skin: Secondary | ICD-10-CM | POA: Diagnosis not present

## 2022-06-14 DIAGNOSIS — E119 Type 2 diabetes mellitus without complications: Secondary | ICD-10-CM | POA: Diagnosis not present

## 2022-06-14 DIAGNOSIS — J019 Acute sinusitis, unspecified: Secondary | ICD-10-CM | POA: Diagnosis not present

## 2022-06-14 DIAGNOSIS — B9689 Other specified bacterial agents as the cause of diseases classified elsewhere: Secondary | ICD-10-CM | POA: Diagnosis not present

## 2022-06-14 DIAGNOSIS — Z794 Long term (current) use of insulin: Secondary | ICD-10-CM | POA: Diagnosis not present

## 2022-06-14 DIAGNOSIS — R531 Weakness: Secondary | ICD-10-CM | POA: Diagnosis not present

## 2022-06-14 DIAGNOSIS — E1165 Type 2 diabetes mellitus with hyperglycemia: Secondary | ICD-10-CM | POA: Diagnosis not present

## 2022-06-14 DIAGNOSIS — I10 Essential (primary) hypertension: Secondary | ICD-10-CM | POA: Diagnosis not present

## 2022-06-20 ENCOUNTER — Telehealth (INDEPENDENT_AMBULATORY_CARE_PROVIDER_SITE_OTHER): Payer: Medicare HMO | Admitting: Psychiatry

## 2022-06-20 DIAGNOSIS — F4310 Post-traumatic stress disorder, unspecified: Secondary | ICD-10-CM

## 2022-06-20 DIAGNOSIS — F3130 Bipolar disorder, current episode depressed, mild or moderate severity, unspecified: Secondary | ICD-10-CM

## 2022-06-20 MED ORDER — PREGABALIN 300 MG PO CAPS: 300.0000 mg | ORAL_CAPSULE | Freq: Two times a day (BID) | ORAL | 5 refills | Status: AC

## 2022-06-20 MED ORDER — ALPRAZOLAM 1 MG PO TABS: 1.0000 mg | ORAL_TABLET | Freq: Three times a day (TID) | ORAL | 5 refills | Status: DC

## 2022-06-20 MED ORDER — DULOXETINE HCL 20 MG PO CPEP: 20.0000 mg | ORAL_CAPSULE | Freq: Four times a day (QID) | ORAL | 5 refills | Status: DC

## 2022-06-20 NOTE — Progress Notes (Signed)
Virtual Visit via Telephone Note  I connected with Tara Levine on 06/20/22 at  1:20 PM EDT by telephone and verified that I am speaking with the correct person using two identifiers.  Location: Patient: Home Provider: Hospital   I discussed the limitations, risks, security and privacy concerns of performing an evaluation and management service by telephone and the availability of in person appointments. I also discussed with the patient that there may be a patient responsible charge related to this service. The patient expressed understanding and agreed to proceed.   History of Present Illness: Patient seen and chart reviewed.  Patient reached by telephone.  No specific new complaints.  Chronic anxiety.  We spent much of the time talking about her concerns about no longer being able to see me.  Patient would be very frightened and having to stop benzodiazepines.    Observations/Objective: Alert and oriented.  No suicidal ideation and no psychosis.  Assessment and Plan: Reviewed with her that we will be getting out information about possible alternative providers that she consider.  Otherwise renewed medicine for 6 months.  Supportive counseling.  Reviewed medicine with patient.   Follow Up Instructions:    I discussed the assessment and treatment plan with the patient. The patient was provided an opportunity to ask questions and all were answered. The patient agreed with the plan and demonstrated an understanding of the instructions.   The patient was advised to call back or seek an in-person evaluation if the symptoms worsen or if the condition fails to improve as anticipated.  I provided 20 minutes of non-face-to-face time during this encounter.   Mordecai Rasmussen, MD

## 2022-06-21 ENCOUNTER — Other Ambulatory Visit: Payer: Self-pay | Admitting: Internal Medicine

## 2022-06-21 DIAGNOSIS — R269 Unspecified abnormalities of gait and mobility: Secondary | ICD-10-CM | POA: Diagnosis not present

## 2022-06-21 DIAGNOSIS — E7800 Pure hypercholesterolemia, unspecified: Secondary | ICD-10-CM

## 2022-06-21 DIAGNOSIS — C9210 Chronic myeloid leukemia, BCR/ABL-positive, not having achieved remission: Secondary | ICD-10-CM | POA: Diagnosis not present

## 2022-06-21 DIAGNOSIS — E1165 Type 2 diabetes mellitus with hyperglycemia: Secondary | ICD-10-CM | POA: Diagnosis not present

## 2022-06-21 DIAGNOSIS — Z794 Long term (current) use of insulin: Secondary | ICD-10-CM | POA: Diagnosis not present

## 2022-06-28 DIAGNOSIS — G8929 Other chronic pain: Secondary | ICD-10-CM | POA: Diagnosis not present

## 2022-06-28 DIAGNOSIS — M545 Low back pain, unspecified: Secondary | ICD-10-CM | POA: Diagnosis not present

## 2022-07-01 ENCOUNTER — Encounter (HOSPITAL_COMMUNITY): Payer: Self-pay

## 2022-07-02 DIAGNOSIS — M25561 Pain in right knee: Secondary | ICD-10-CM | POA: Diagnosis not present

## 2022-07-02 DIAGNOSIS — M545 Low back pain, unspecified: Secondary | ICD-10-CM | POA: Diagnosis not present

## 2022-07-02 DIAGNOSIS — M25551 Pain in right hip: Secondary | ICD-10-CM | POA: Diagnosis not present

## 2022-07-02 DIAGNOSIS — G8929 Other chronic pain: Secondary | ICD-10-CM | POA: Diagnosis not present

## 2022-07-31 ENCOUNTER — Telehealth: Payer: Self-pay | Admitting: *Deleted

## 2022-07-31 NOTE — Telephone Encounter (Signed)
Patient called reporting that she hours as a chest cold and sore throat. She has stopped taking her oral chemotherapy and said she needs to be off of it until she is over this. She also states that she needs antibiotics. Please advise

## 2022-07-31 NOTE — Telephone Encounter (Signed)
Call returned to patient and informed of doctor response . She states that she will call her PCP

## 2022-08-09 NOTE — Telephone Encounter (Unsigned)
Copied from CRM [id number]. Topic: General - Other >> Aug 09, 2022  9:10 AM Franchot Heidelberg wrote: Morrie Sheldon from [employer] called to see if fax submissions were received for incontinence supplies.   06/07 05/23  Best contact: [phone]  She plans to refax the request again

## 2022-08-09 NOTE — Telephone Encounter (Signed)
I did not see this paperwork in chart. Will check fax folder.

## 2022-08-13 ENCOUNTER — Inpatient Hospital Stay: Payer: Medicare HMO | Attending: Oncology

## 2022-08-19 DIAGNOSIS — R32 Unspecified urinary incontinence: Secondary | ICD-10-CM | POA: Diagnosis not present

## 2022-08-22 ENCOUNTER — Telehealth: Payer: Self-pay

## 2022-08-22 NOTE — Telephone Encounter (Signed)
Pt no showed for labs last week. Please r/s appt on 6/28 appt as well.   Labs  Mychart 1 week after labs.   Please inform pt of appts.

## 2022-08-23 ENCOUNTER — Inpatient Hospital Stay: Payer: Medicare HMO | Admitting: Oncology

## 2022-08-23 DIAGNOSIS — M545 Low back pain, unspecified: Secondary | ICD-10-CM | POA: Diagnosis not present

## 2022-08-23 DIAGNOSIS — Z79891 Long term (current) use of opiate analgesic: Secondary | ICD-10-CM | POA: Diagnosis not present

## 2022-08-23 DIAGNOSIS — G8929 Other chronic pain: Secondary | ICD-10-CM | POA: Diagnosis not present

## 2022-08-23 DIAGNOSIS — M25561 Pain in right knee: Secondary | ICD-10-CM | POA: Diagnosis not present

## 2022-08-23 DIAGNOSIS — M25562 Pain in left knee: Secondary | ICD-10-CM | POA: Diagnosis not present

## 2022-08-26 ENCOUNTER — Inpatient Hospital Stay: Payer: Medicare HMO | Attending: Oncology

## 2022-08-26 DIAGNOSIS — Z8042 Family history of malignant neoplasm of prostate: Secondary | ICD-10-CM | POA: Insufficient documentation

## 2022-08-26 DIAGNOSIS — G4730 Sleep apnea, unspecified: Secondary | ICD-10-CM | POA: Insufficient documentation

## 2022-08-26 DIAGNOSIS — Z794 Long term (current) use of insulin: Secondary | ICD-10-CM | POA: Insufficient documentation

## 2022-08-26 DIAGNOSIS — D751 Secondary polycythemia: Secondary | ICD-10-CM | POA: Insufficient documentation

## 2022-08-26 DIAGNOSIS — Z7984 Long term (current) use of oral hypoglycemic drugs: Secondary | ICD-10-CM | POA: Insufficient documentation

## 2022-08-26 DIAGNOSIS — C9212 Chronic myeloid leukemia, BCR/ABL-positive, in relapse: Secondary | ICD-10-CM | POA: Insufficient documentation

## 2022-08-26 DIAGNOSIS — Z79899 Other long term (current) drug therapy: Secondary | ICD-10-CM | POA: Insufficient documentation

## 2022-08-26 DIAGNOSIS — E119 Type 2 diabetes mellitus without complications: Secondary | ICD-10-CM | POA: Insufficient documentation

## 2022-08-26 DIAGNOSIS — R5383 Other fatigue: Secondary | ICD-10-CM | POA: Insufficient documentation

## 2022-08-28 ENCOUNTER — Telehealth: Payer: Self-pay | Admitting: Internal Medicine

## 2022-08-28 NOTE — Telephone Encounter (Signed)
Tara Levine with [facility] is calling to check and see if the PCP received the fax that she sent over for incontinence supplies on 08/15/2022. Tara Levine states that the prescription is getting ready to run out and they will be needing a new prescription. Please call Tara Levine back.   [phone] [name] phone number)

## 2022-08-28 NOTE — Telephone Encounter (Signed)
Called to notify no longer patient here

## 2022-08-30 ENCOUNTER — Telehealth: Payer: Self-pay

## 2022-08-30 NOTE — Telephone Encounter (Signed)
Pt missed labs on 7/1 again. Mychart appt on 7/12 will need to be r/s.   Please r/s labs , then Mychart visit approx 10 days after labs, per pt availability.

## 2022-09-06 ENCOUNTER — Telehealth: Payer: Medicare HMO | Admitting: Oncology

## 2022-09-06 ENCOUNTER — Inpatient Hospital Stay: Payer: Medicare HMO

## 2022-09-06 DIAGNOSIS — E119 Type 2 diabetes mellitus without complications: Secondary | ICD-10-CM | POA: Diagnosis not present

## 2022-09-06 DIAGNOSIS — C9212 Chronic myeloid leukemia, BCR/ABL-positive, in relapse: Secondary | ICD-10-CM | POA: Diagnosis not present

## 2022-09-06 DIAGNOSIS — Z794 Long term (current) use of insulin: Secondary | ICD-10-CM | POA: Diagnosis not present

## 2022-09-06 DIAGNOSIS — J45909 Unspecified asthma, uncomplicated: Secondary | ICD-10-CM | POA: Diagnosis not present

## 2022-09-06 DIAGNOSIS — D751 Secondary polycythemia: Secondary | ICD-10-CM | POA: Diagnosis not present

## 2022-09-06 DIAGNOSIS — C9210 Chronic myeloid leukemia, BCR/ABL-positive, not having achieved remission: Secondary | ICD-10-CM

## 2022-09-06 DIAGNOSIS — R5383 Other fatigue: Secondary | ICD-10-CM | POA: Diagnosis not present

## 2022-09-06 DIAGNOSIS — F319 Bipolar disorder, unspecified: Secondary | ICD-10-CM | POA: Diagnosis not present

## 2022-09-06 DIAGNOSIS — G4733 Obstructive sleep apnea (adult) (pediatric): Secondary | ICD-10-CM | POA: Diagnosis not present

## 2022-09-06 DIAGNOSIS — E1165 Type 2 diabetes mellitus with hyperglycemia: Secondary | ICD-10-CM | POA: Diagnosis not present

## 2022-09-06 DIAGNOSIS — Z79899 Other long term (current) drug therapy: Secondary | ICD-10-CM | POA: Diagnosis not present

## 2022-09-06 DIAGNOSIS — Z7984 Long term (current) use of oral hypoglycemic drugs: Secondary | ICD-10-CM | POA: Diagnosis not present

## 2022-09-06 DIAGNOSIS — M5010 Cervical disc disorder with radiculopathy, unspecified cervical region: Secondary | ICD-10-CM | POA: Diagnosis not present

## 2022-09-06 DIAGNOSIS — G4730 Sleep apnea, unspecified: Secondary | ICD-10-CM | POA: Diagnosis not present

## 2022-09-06 DIAGNOSIS — Z1231 Encounter for screening mammogram for malignant neoplasm of breast: Secondary | ICD-10-CM | POA: Diagnosis not present

## 2022-09-06 DIAGNOSIS — Z8042 Family history of malignant neoplasm of prostate: Secondary | ICD-10-CM | POA: Diagnosis not present

## 2022-09-06 LAB — CMP (CANCER CENTER ONLY)
ALT: 40 U/L (ref 0–44)
AST: 35 U/L (ref 15–41)
Albumin: 4.5 g/dL (ref 3.5–5.0)
Alkaline Phosphatase: 70 U/L (ref 38–126)
Anion gap: 11 (ref 5–15)
BUN: 24 mg/dL — ABNORMAL HIGH (ref 8–23)
CO2: 23 mmol/L (ref 22–32)
Calcium: 9.8 mg/dL (ref 8.9–10.3)
Chloride: 102 mmol/L (ref 98–111)
Creatinine: 1.08 mg/dL — ABNORMAL HIGH (ref 0.44–1.00)
GFR, Estimated: 56 mL/min — ABNORMAL LOW (ref >60)
Glucose, Bld: 130 mg/dL — ABNORMAL HIGH (ref 70–99)
Potassium: 4.3 mmol/L (ref 3.5–5.1)
Sodium: 136 mmol/L (ref 135–145)
Total Bilirubin: 0.5 mg/dL (ref 0.3–1.2)
Total Protein: 8.1 g/dL (ref 6.5–8.1)

## 2022-09-06 LAB — CBC WITH DIFFERENTIAL (CANCER CENTER ONLY)
Abs Immature Granulocytes: 0.03 10*3/uL (ref 0.00–0.07)
Basophils Absolute: 0 10*3/uL (ref 0.0–0.1)
Basophils Relative: 0 %
Eosinophils Absolute: 0.2 10*3/uL (ref 0.0–0.5)
Eosinophils Relative: 2 %
HCT: 46.3 % — ABNORMAL HIGH (ref 36.0–46.0)
Hemoglobin: 15.1 g/dL — ABNORMAL HIGH (ref 12.0–15.0)
Immature Granulocytes: 0 %
Lymphocytes Relative: 17 %
Lymphs Abs: 1.3 10*3/uL (ref 0.7–4.0)
MCH: 27.2 pg (ref 26.0–34.0)
MCHC: 32.6 g/dL (ref 30.0–36.0)
MCV: 83.3 fL (ref 80.0–100.0)
Monocytes Absolute: 0.6 10*3/uL (ref 0.1–1.0)
Monocytes Relative: 8 %
Neutro Abs: 5.7 10*3/uL (ref 1.7–7.7)
Neutrophils Relative %: 73 %
Platelet Count: 250 10*3/uL (ref 150–400)
RBC: 5.56 MIL/uL — ABNORMAL HIGH (ref 3.87–5.11)
RDW: 13.5 % (ref 11.5–15.5)
WBC Count: 7.8 10*3/uL (ref 4.0–10.5)
nRBC: 0 % (ref 0.0–0.2)

## 2022-09-09 ENCOUNTER — Other Ambulatory Visit: Payer: Self-pay | Admitting: Internal Medicine

## 2022-09-09 DIAGNOSIS — Z1231 Encounter for screening mammogram for malignant neoplasm of breast: Secondary | ICD-10-CM

## 2022-09-11 LAB — BCR-ABL1 FISH
Cells Analyzed: 200
Cells Counted: 200

## 2022-09-13 LAB — BCR-ABL1, CML/ALL, PCR, QUANT
E1A2 Transcript: <0.0032 %
Interpretation (BCRAL):: NEGATIVE
b2a2 transcript: <0.0032 %
b3a2 transcript: <0.0032 %

## 2022-09-18 ENCOUNTER — Ambulatory Visit: Payer: Medicare HMO | Admitting: Oncology

## 2022-09-18 ENCOUNTER — Inpatient Hospital Stay (HOSPITAL_BASED_OUTPATIENT_CLINIC_OR_DEPARTMENT_OTHER): Payer: Medicare HMO | Admitting: Oncology

## 2022-09-18 DIAGNOSIS — C9210 Chronic myeloid leukemia, BCR/ABL-positive, not having achieved remission: Secondary | ICD-10-CM | POA: Diagnosis not present

## 2022-09-18 DIAGNOSIS — Z5111 Encounter for antineoplastic chemotherapy: Secondary | ICD-10-CM

## 2022-09-18 DIAGNOSIS — D751 Secondary polycythemia: Secondary | ICD-10-CM | POA: Insufficient documentation

## 2022-09-18 MED ORDER — BOSUTINIB 100 MG PO TABS: 400.0000 mg | ORAL_TABLET | Freq: Every day | ORAL | 2 refills | Status: DC

## 2022-09-18 MED ORDER — BOSUTINIB 100 MG PO TABS: 400.0000 mg | ORAL_TABLET | Freq: Every day | ORAL | 3 refills | Status: DC

## 2022-09-18 NOTE — Assessment & Plan Note (Signed)
Likely secondary to sleep apnea.  No need for phlebotomy.

## 2022-09-18 NOTE — Assessment & Plan Note (Signed)
Treatment plan as listed above. 

## 2022-09-18 NOTE — Assessment & Plan Note (Addendum)
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 02/20/2021, BCR ABL 1 FISH is negative.b3 a2 transcript 0.27%. 08/03/2021, BCR-ABL1 FISH is negative,b3 a2 transcript 0.0521%. 10/26/2021 BCR-ABL1 FISH is negative,b3 a2 transcript 0.0458% 01/31/2022 BCR-ABL1 FISH is negative,b3 a2 transcript 0.0251% 05/17/2022 BCR-ABL1 FISH is negative,b3 a2 transcript 0.0410% 09/06/2022 BCR-ABL1 FISH is negative,b3 a2 transcript negative - Recommend patient to continue bosutinib,continue 400mg  daily as her renal function has improved.  Refill prescription has been sent to pharmacy.

## 2022-09-18 NOTE — Progress Notes (Signed)
HEMATOLOGY-ONCOLOGY TeleHEALTH VISIT PROGRESS NOTE  I connected with Tara Levine on 09/18/22  at  2:45 PM EDT by video enabled telemedicine visit and verified that I am speaking with the correct person using two identifiers. I discussed the limitations, risks, security and privacy concerns of performing an evaluation and management service by telemedicine and the availability of in-person appointments. The patient expressed understanding and agreed to proceed.   Other persons participating in the visit and their role in the encounter:  None  Patient's location: Home  Provider's location: office Chief Complaint: CML   INTERVAL HISTORY Tara Levine is a 67 y.o. female who has above history reviewed by me today presents virtually for follow up visit for management of CML Patients requests virtual visits due to difficulty in ambulating and not able to come for in person follow up I discussed the limitations of evaluation and management by telemedicine. The patient expressed understanding and agreed to proceed. She takes Bosutinib 400mg  daily.    Review of Systems  Constitutional:  Positive for fatigue. Negative for chills and fever.  HENT:   Negative for hearing loss and voice change.   Eyes:  Negative for eye problems.  Respiratory:  Negative for chest tightness, cough and shortness of breath.   Cardiovascular:  Negative for chest pain.  Gastrointestinal:  Negative for abdominal distention, abdominal pain, blood in stool and nausea.  Endocrine: Negative for hot flashes.  Genitourinary:  Negative for difficulty urinating, frequency and hematuria.   Musculoskeletal:  Negative for arthralgias and flank pain.  Skin:  Negative for itching and rash.  Neurological:  Negative for extremity weakness.  Hematological:  Negative for adenopathy.  Psychiatric/Behavioral:  Negative for confusion. The patient is not nervous/anxious.     Past Medical History:  Diagnosis Date   CML (chronic  myelocytic leukemia) (HCC)    Depression    DM type 2 (diabetes mellitus, type 2) (HCC)    Environmental allergies    History of gastric ulcer    HLD (hyperlipidemia)    IDA (iron deficiency anemia)    chronic   Leukemia (HCC)    Leukemia, chronic myeloid (HCC)    Osteoarthritis    Panic disorder    PSS (progressive systemic sclerosis) (HCC)    Unspecified essential hypertension    Past Surgical History:  Procedure Laterality Date   back and neck surgery  1999, 2009   had rods placed   CARDIAC CATHETERIZATION  1990   [facility]) No blackages found.    CARPAL TUNNEL RELEASE     PARTIAL HYSTERECTOMY     age 62, no cancer    Family History  Problem Relation Age of Onset   Diabetes Mother    Hypertension Mother    Heart disease Mother    Prostate cancer Father    Heart disease Father    Diabetes Brother     Social History   Socioeconomic History   Marital status: Married    Spouse name: Not on file   Number of children: Not on file   Years of education: Not on file   Highest education level: Not on file  Occupational History   Not on file  Tobacco Use   Smoking status: Never   Smokeless tobacco: Never   Tobacco comments:    quit 1985  Vaping Use   Vaping status: Never Used  Substance and Sexual Activity   Alcohol use: No    Alcohol/week: 0.0 standard drinks of alcohol   Drug  use: No   Sexual activity: Not Currently  Other Topics Concern   Not on file  Social History Narrative   Not on file   Social Determinants of Health   Financial Resource Strain: Low Risk  (09/06/2022)   Received from [facility]   Overall Financial Resource Strain (CARDIA)    Difficulty of Paying Living Expenses: Not hard at all  Food Insecurity: No Food Insecurity (09/06/2022)   Received from [facility]   Hunger Vital Sign    Worried About Running Out of Food in the Last Year: Never true    Ran Out of Food in the Last Year:  Never true  Transportation Needs: No Transportation Needs (09/06/2022)   Received from [facility] - Transportation    In the past 12 months, has lack of transportation kept you from medical appointments or from getting medications?: No    Lack of Transportation (Non-Medical): No  Physical Activity: Not on file  Stress: Not on file  Social Connections: Not on file  Intimate Partner Violence: Not on file    Current Outpatient Medications on File Prior to Visit  Medication Sig Dispense Refill   ACCU-CHEK GUIDE test strip TEST DAILY AND AS DIRECTED. 300 strip 10   Accu-Chek Softclix Lancets lancets TEST BLOOD SUGAR THREE TIMES DAILY AS INSTRUCTED 300 each 10   albuterol (PROVENTIL HFA;VENTOLIN HFA) 108 (90 BASE) MCG/ACT inhaler Inhale 2 puffs into the lungs every 6 (six) hours as needed for wheezing or shortness of breath.     albuterol (PROVENTIL) (2.5 MG/3ML) 0.083% nebulizer solution INHALE CONTENTS OF 1 VIAL VIA NEBULIZER THREE TIMES A DAY prn     Alcohol Swabs (DROPSAFE ALCOHOL PREP) 70 % PADS USE THREE TIMES DAILY  TO CHECK BLOOD SUGAR 300 each 10   ALPRAZolam (XANAX) 1 MG tablet Take 1 tablet (1 mg total) by mouth 3 (three) times daily. 90 tablet 5   amLODipine (NORVASC) 5 MG tablet Take 1 tablet (5 mg total) by mouth daily. 90 tablet 0   Ascorbic Acid (VITAMIN C) 1000 MG tablet Take 3,000 mg by mouth daily.     atorvastatin (LIPITOR) 20 MG tablet Take 1 tablet (20 mg total) by mouth daily. 90 tablet 3   baclofen (LIORESAL) 10 MG tablet Take 10 mg by mouth 2 (two) times daily. 1/2 tab     Blood Glucose Monitoring Suppl (GLUCOCOM BLOOD GLUCOSE MONITOR) DEVI Accu -chek 1 each 0   bosutinib (BOSULIF) 100 MG tablet Take 4 tablets (400 mg total) by mouth daily with breakfast. Take with food. 120 tablet 2   celecoxib (CELEBREX) 100 MG capsule      cetirizine (ZYRTEC) 10 MG tablet Take 20 mg by mouth daily as needed.      Cholecalciferol (VITAMIN D PO) Take 1 tablet  by mouth daily.     co-enzyme Q-10 50 MG capsule Take 50 mg by mouth daily.     Continuous Blood Gluc Receiver (DEXCOM G7 RECEIVER) DEVI 1 each by Does not apply route every 14 (fourteen) days. 1 each 3   Continuous Blood Gluc Sensor (DEXCOM G7 SENSOR) MISC 1 each by Does not apply route every 14 (fourteen) days. 12 each 1   diphenhydrAMINE (BENADRYL) 25 MG tablet Take 25 mg by mouth every 6 (six) hours as needed for itching or allergies.      DULoxetine (CYMBALTA) 20 MG capsule Take 1 capsule (20 mg total) by mouth in the morning,  at noon, in the evening, and at bedtime. 120 capsule 5   empagliflozin (JARDIANCE) 25 MG TABS tablet Take 1 tablet (25 mg total) by mouth daily before breakfast. 30 tablet 3   fluticasone (FLONASE) 50 MCG/ACT nasal spray Place 1-2 sprays into both nostrils as needed for rhinitis.      furosemide (LASIX) 40 MG tablet Take 1 tablet (40 mg total) by mouth daily. 30 tablet 3   insulin aspart (NOVOLOG) 100 UNIT/ML injection Inject 10 Units into the skin 3 (three) times daily as needed for high blood sugar. Pt reports she is taking 2-8 units per sliding scale based on glucose     NOVOLOG FLEXPEN 100 UNIT/ML FlexPen Inject into the skin.     nystatin (MYCOSTATIN/NYSTOP) powder Apply 1 Application topically 3 (three) times daily. 60 g 2   Omega-3 Fatty Acids (OMEGA 3 PO) Take 3 capsules by mouth daily.     ondansetron (ZOFRAN-ODT) 4 MG disintegrating tablet Take 1 tablet (4 mg total) by mouth every 8 (eight) hours as needed for nausea or vomiting. 20 tablet 0   Oxycodone HCl 10 MG TABS Take 10 mg by mouth as needed.     pantoprazole (PROTONIX) 40 MG tablet TAKE 1 TABLET BY MOUTH EVERY DAY 90 tablet 1   polyethylene glycol (MIRALAX MIX-IN PAX) 17 g packet Take 17 g by mouth daily as needed. 14 each 0   pregabalin (LYRICA) 300 MG capsule Take 1 capsule (300 mg total) by mouth 2 (two) times daily. 60 capsule 5   Semaglutide, 1 MG/DOSE, 4 MG/3ML SOPN Inject 1 mg as directed once a  week. 3 mL 0   sucralfate (CARAFATE) 1 GM/10ML suspension Take 10 mLs (1 g total) by mouth 4 (four) times daily. 420 mL 1   vitamin B-12 (CYANOCOBALAMIN) 1000 MCG tablet Take 4,000 mcg by mouth daily.     HYDROmorphone (DILAUDID) 4 MG tablet Take 4 mg by mouth 3 (three) times daily as needed. (Patient not taking: Reported on 09/18/2022)     No current facility-administered medications on file prior to visit.    Allergies  Allergen Reactions   Morphine Rash    Headache too   Codeine Other (See Comments)    Reaction: Unknown    Latex Other (See Comments)    Reaction:  Unknown    Morphine And Codeine Other (See Comments)    Reaction:  Unknown    Relafen [Nabumetone] Hives   Nsaids Rash   Tape Other (See Comments) and Rash    Reaction:  Unknown        Observations/Objective: There were no vitals filed for this visit.   There is no height or weight on file to calculate BMI.  Physical Exam Neurological:     Mental Status: She is alert.     LABS    Latest Ref Rng & Units 09/06/2022    1:08 PM 05/17/2022   12:12 PM 01/31/2022   12:02 PM  CBC  WBC 4.0 - 10.5 K/uL 7.8  10.3  6.1   Hemoglobin 12.0 - 15.0 g/dL 82.9  56.2  13.0   Hematocrit 36.0 - 46.0 % 46.3  40.5  43.9   Platelets 150 - 400 K/uL 250  298  243       Latest Ref Rng & Units 09/06/2022    1:08 PM 05/17/2022   12:12 PM 01/31/2022   12:02 PM  CMP  Glucose 70 - 99 mg/dL 865  784  696   BUN 8 -  23 mg/dL 24  15  11    Creatinine 0.44 - 1.00 mg/dL 2.53  6.64  4.03   Sodium 135 - 145 mmol/L 136  134  140   Potassium 3.5 - 5.1 mmol/L 4.3  4.2  4.1   Chloride 98 - 111 mmol/L 102  101  102   CO2 22 - 32 mmol/L 23  26  28    Calcium 8.9 - 10.3 mg/dL 9.8  9.2  9.6   Total Protein 6.5 - 8.1 g/dL 8.1  7.2  7.6   Total Bilirubin 0.3 - 1.2 mg/dL 0.5  0.6  0.5   Alkaline Phos 38 - 126 U/L 70  87  79   AST 15 - 41 U/L 35  25  32   ALT 0 - 44 U/L 40  21  27    ASSESSMENT & PLAN:   CML (chronic myelocytic leukemia)  (HCC) #CML, chronic phase, 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 02/20/2021, BCR ABL 1 FISH is negative.b3 a2 transcript 0.27%. 08/03/2021, BCR-ABL1 FISH is negative,b3 a2 transcript 0.0521%. 10/26/2021 BCR-ABL1 FISH is negative,b3 a2 transcript 0.0458% 01/31/2022 BCR-ABL1 FISH is negative,b3 a2 transcript 0.0251% 05/17/2022 BCR-ABL1 FISH is negative,b3 a2 transcript 0.0410% 09/06/2022 BCR-ABL1 FISH is negative,b3 a2 transcript negative - Recommend patient to continue bosutinib,continue 400mg  daily as her renal function has improved.  Refill prescription has been sent to pharmacy.  Encounter for antineoplastic chemotherapy Treatment plan as listed above.   Erythrocytosis Likely secondary to sleep apnea.  No need for phlebotomy.   Orders Placed This Encounter  Procedures   CBC with Differential (Cancer Center Only)    Standing Status:   Future    Standing Expiration Date:   09/18/2023   CMP (Cancer Center only)    Standing Status:   Future    Standing Expiration Date:   09/18/2023   Vitamin B12    Standing Status:   Future    Standing Expiration Date:   09/18/2023   BCR-ABL1 FISH    Standing Status:   Future    Standing Expiration Date:   09/18/2023   BCR-ABL1, CML/ALL, PCR, QUANT    Standing Status:   Future    Standing Expiration Date:   09/18/2023   I discussed the assessment and treatment plan with the patient. The patient was provided an opportunity to ask questions and all were answered. She has not been able to come to our office for in person visit due to multiple medical issues. She understands the limitation of virtual visits. The patient agreed with the plan and demonstrated an understanding of the instructions. The patient was advised to call back or seek an in-person evaluation if the symptoms worsen or if the condition fails to improve as anticipated.  All questions were answered.   Follow up in 3  months, patient understands the limitation of virtual visit and requests virtual visit due to transportation difficulty.    Rickard Patience, MD, PhD [facility] Hematology Oncology 09/18/2022

## 2022-09-26 ENCOUNTER — Telehealth: Payer: Self-pay | Admitting: Internal Medicine

## 2022-09-26 NOTE — Telephone Encounter (Signed)
Called to let them know no longer patient here

## 2022-09-26 NOTE — Telephone Encounter (Signed)
Morrie Sheldon with [employer] is calling in regarding a fax she sent over on 07/08 and 07/24 for the pt's incontinence supplies but she hasn't heard anything back. Please follow up with Morrie Sheldon [phone]. Morrie Sheldon wants to know if pt needs an appointment or is there anything else holding up the paperwork being sent back. [phone]

## 2022-10-18 DIAGNOSIS — M545 Low back pain, unspecified: Secondary | ICD-10-CM | POA: Diagnosis not present

## 2022-10-18 DIAGNOSIS — M25562 Pain in left knee: Secondary | ICD-10-CM | POA: Diagnosis not present

## 2022-10-18 DIAGNOSIS — G8929 Other chronic pain: Secondary | ICD-10-CM | POA: Diagnosis not present

## 2022-10-18 DIAGNOSIS — M25561 Pain in right knee: Secondary | ICD-10-CM | POA: Diagnosis not present

## 2022-11-02 ENCOUNTER — Other Ambulatory Visit: Payer: Self-pay

## 2022-11-02 ENCOUNTER — Emergency Department
Admission: EM | Admit: 2022-11-02 | Discharge: 2022-11-02 | Disposition: A | Discharge status: Home / Self Care | Payer: Medicare HMO | Attending: Emergency Medicine | Admitting: Emergency Medicine

## 2022-11-02 ENCOUNTER — Emergency Department: Payer: Medicare HMO

## 2022-11-02 DIAGNOSIS — S0990XA Unspecified injury of head, initial encounter: Secondary | ICD-10-CM | POA: Insufficient documentation

## 2022-11-02 DIAGNOSIS — S12691A Other nondisplaced fracture of seventh cervical vertebra, initial encounter for closed fracture: Secondary | ICD-10-CM | POA: Diagnosis not present

## 2022-11-02 DIAGNOSIS — R109 Unspecified abdominal pain: Secondary | ICD-10-CM

## 2022-11-02 DIAGNOSIS — R102 Pelvic and perineal pain: Secondary | ICD-10-CM | POA: Diagnosis not present

## 2022-11-02 DIAGNOSIS — R079 Chest pain, unspecified: Secondary | ICD-10-CM | POA: Insufficient documentation

## 2022-11-02 DIAGNOSIS — S3993XA Unspecified injury of pelvis, initial encounter: Secondary | ICD-10-CM | POA: Diagnosis not present

## 2022-11-02 DIAGNOSIS — S129XXA Fracture of neck, unspecified, initial encounter: Secondary | ICD-10-CM

## 2022-11-02 DIAGNOSIS — W01198A Fall on same level from slipping, tripping and stumbling with subsequent striking against other object, initial encounter: Secondary | ICD-10-CM | POA: Insufficient documentation

## 2022-11-02 DIAGNOSIS — S199XXA Unspecified injury of neck, initial encounter: Secondary | ICD-10-CM | POA: Diagnosis not present

## 2022-11-02 DIAGNOSIS — M542 Cervicalgia: Secondary | ICD-10-CM | POA: Insufficient documentation

## 2022-11-02 DIAGNOSIS — S3991XA Unspecified injury of abdomen, initial encounter: Secondary | ICD-10-CM | POA: Diagnosis not present

## 2022-11-02 DIAGNOSIS — S299XXA Unspecified injury of thorax, initial encounter: Secondary | ICD-10-CM | POA: Diagnosis not present

## 2022-11-02 LAB — CBC WITH DIFFERENTIAL/PLATELET
Abs Immature Granulocytes: 0.03 10*3/uL (ref 0.00–0.07)
Basophils Absolute: 0 10*3/uL (ref 0.0–0.1)
Basophils Relative: 0 %
Eosinophils Absolute: 0.2 10*3/uL (ref 0.0–0.5)
Eosinophils Relative: 3 %
HCT: 44 % (ref 36.0–46.0)
Hemoglobin: 14.7 g/dL (ref 12.0–15.0)
Immature Granulocytes: 0 %
Lymphocytes Relative: 15 %
Lymphs Abs: 1.1 10*3/uL (ref 0.7–4.0)
MCH: 27.3 pg (ref 26.0–34.0)
MCHC: 33.4 g/dL (ref 30.0–36.0)
MCV: 81.6 fL (ref 80.0–100.0)
Monocytes Absolute: 0.7 10*3/uL (ref 0.1–1.0)
Monocytes Relative: 9 %
Neutro Abs: 5.7 10*3/uL (ref 1.7–7.7)
Neutrophils Relative %: 73 %
Platelets: 228 10*3/uL (ref 150–400)
RBC: 5.39 MIL/uL — ABNORMAL HIGH (ref 3.87–5.11)
RDW: 13.5 % (ref 11.5–15.5)
WBC: 7.8 10*3/uL (ref 4.0–10.5)
nRBC: 0 % (ref 0.0–0.2)

## 2022-11-02 LAB — COMPREHENSIVE METABOLIC PANEL
ALT: 32 U/L (ref 0–44)
AST: 33 U/L (ref 15–41)
Albumin: 4.2 g/dL (ref 3.5–5.0)
Alkaline Phosphatase: 64 U/L (ref 38–126)
Anion gap: 14 (ref 5–15)
BUN: 25 mg/dL — ABNORMAL HIGH (ref 8–23)
CO2: 27 mmol/L (ref 22–32)
Calcium: 9.1 mg/dL (ref 8.9–10.3)
Chloride: 93 mmol/L — ABNORMAL LOW (ref 98–111)
Creatinine, Ser: 0.98 mg/dL (ref 0.44–1.00)
GFR, Estimated: >60 mL/min (ref >60)
Glucose, Bld: 121 mg/dL — ABNORMAL HIGH (ref 70–99)
Potassium: 3.5 mmol/L (ref 3.5–5.1)
Sodium: 134 mmol/L — ABNORMAL LOW (ref 135–145)
Total Bilirubin: 0.8 mg/dL (ref 0.3–1.2)
Total Protein: 7.6 g/dL (ref 6.5–8.1)

## 2022-11-02 MED ORDER — IOHEXOL 300 MG/ML  SOLN
100.0000 mL | Freq: Once | INTRAMUSCULAR | Status: AC | PRN
  Administered 2022-11-02: 100 mL via INTRAVENOUS

## 2022-11-02 MED ORDER — HYDROMORPHONE HCL 1 MG/ML IJ SOLN
0.5000 mg | Freq: Once | INTRAMUSCULAR | Status: AC
  Administered 2022-11-02: 0.5 mg via INTRAVENOUS
  Filled 2022-11-02: qty 0.5

## 2022-11-02 NOTE — Discharge Instructions (Addendum)
You were seen in the ER today for evaluation after fall.  Your testing showed that she may have a small fracture in your neck but typically would not require any surgery, but otherwise we did not find any serious injuries.  You can take your prescribed pain medication to help with your symptoms. Follow-up with your primary care doctor for further evaluation if your symptoms not improved within a couple of days.  You can also follow-up with your spine surgeon if you are continuing to have neck pain.  Return to the ER for new or worsening symptoms.

## 2022-11-02 NOTE — ED Triage Notes (Signed)
[facility] reports pt coming from home c/o fall. Pt states she tripped over a baby gate and is hurting all over.

## 2022-11-02 NOTE — ED Provider Notes (Signed)
[facility] Provider Note    Event Date/Time   First MD Initiated Contact with Patient 11/02/22 1426     (approximate)   History   Fall   HPI  Tara Levine is a 67 year old female presenting to the Emergency Department for evaluation after a fall.  Patient reports that she went to bend over and pick something else when she lost her balance and fell backward over a wooden baby gate onto her face and left side.  Currently complaining of pain in multiple areas including face, neck, left chest, left abdomen, pelvis.  Was able to ambulate from recliner to bed.     Physical Exam   Triage Vital Signs: ED Triage Vitals  Encounter Vitals Group     BP 11/02/22 1314 128/80     Systolic BP Percentile --      Diastolic BP Percentile --      Pulse Rate 11/02/22 1314 76     Resp 11/02/22 1314 18     Temp 11/02/22 1314 98.2 F (36.8 C)     Temp Source 11/02/22 1314 Oral     SpO2 11/02/22 1314 97 %     Weight --      Height --      Head Circumference --      Peak Flow --      Pain Score 11/02/22 1313 10     Pain Loc --      Pain Education --      Exclude from Growth Chart --     Most recent vital signs: Vitals:   11/02/22 1314  BP: 128/80  Pulse: 76  Resp: 18  Temp: 98.2 F (36.8 C)  SpO2: 97%    Nursing notes and vital signs reviewed.  General: Adult female, laying in bed, awake interactive Head: Atraumatic Chest: Symmetric chest rise, tenderness to palpation over the left anterior chest wall Cardiac: Regular rhythm and rate.  Respiratory: Lungs clear to auscultation Abdomen: Soft, nondistended.  Tenderness palpation over the left lateral abdomen. Pelvis: Stable in AP and lateral compression.  Tenderness over the left pelvis.   MSK: No deformity to bilateral upper and lower extremity. Full range of motion to bilateral upper lower extremity with no pain. Back: Tenderness in multiple locations over the cervical, thoracic, and lumbar spine  without focal area of point tenderness Neuro: Alert, oriented. GCS 15. 5 out of 5 strength in bilateral upper and lower extremities. Normal sensation to light touch in bilateral upper and lower extremity. Skin: No evidence of burns or lacerations.  ED Results / Procedures / Treatments   Labs (all labs ordered are listed, but only abnormal results are displayed) Labs Reviewed  COMPREHENSIVE METABOLIC PANEL - Abnormal; Notable for the following components:      Result Value   Sodium 134 (*)    Chloride 93 (*)    Glucose, Bld 121 (*)    BUN 25 (*)    All other components within normal limits  CBC WITH DIFFERENTIAL/PLATELET - Abnormal; Notable for the following components:   RBC 5.39 (*)    All other components within normal limits     EKG EKG independently reviewed interpreted by myself (ER attending) demonstrates:    RADIOLOGY Imaging independently reviewed and interpreted by myself demonstrates:  CT head without acute bleed CT C-spine without obvious fracture, radiology notes possible C7 transverse process fracture CT chest abdomen pelvis without hemothorax, pneumothorax, other acute intra-abdominal injury  PROCEDURES:  Critical Care performed: No  Procedures   MEDICATIONS ORDERED IN ED: Medications  HYDROmorphone (DILAUDID) injection 0.5 mg (0.5 mg Intravenous Given 11/02/22 1507)  iohexol (OMNIPAQUE) 300 MG/ML solution 100 mL (100 mLs Intravenous Contrast Given 11/02/22 1611)  HYDROmorphone (DILAUDID) injection 0.5 mg (0.5 mg Intravenous Given 11/02/22 1743)     IMPRESSION / MDM / ASSESSMENT AND PLAN / ED COURSE  I reviewed the triage vital signs and the nursing notes.  Differential diagnosis includes, but is not limited to, intracranial bleed, spine fracture, rib fracture, hemothorax, pneumothorax, intra-abdominal injury  Patient's presentation is most consistent with acute presentation with potential threat to life or bodily function.  35 open-year-old female  presenting to the Emergency Department for evaluation after a fall.  Multiple areas of tenderness on exam.  Labs and CT scans ordered to further evaluate.  Patient ordered for Dilaudid for pain control.  CT scans with possible C7 transverse process fracture, no other acute traumatic injuries noted.  Patient does have some tenderness in her neck though it is not clearly isolated to C7.  Patient updated on the results of her workup.  She needs to have some diffuse achy pain, but is comfortable with discharge home.  Patient is on home narcotics that she will use for pain control.  Strict return precautions provided.  Patient does transanal condition.     FINAL CLINICAL IMPRESSION(S) / ED DIAGNOSES   Final diagnoses:  Closed head injury, initial encounter  Closed fracture of transverse process of cervical vertebra, initial encounter (HCC)  Flank pain     Rx / DC Orders   ED Discharge Orders     None        Note:  This document was prepared using Dragon voice recognition software and may include unintentional dictation errors.   Trinna Post, MD 11/02/22 [phone]

## 2022-11-02 NOTE — ED Notes (Signed)
Patient given a remote and mouth moisturizing items per request.

## 2022-11-02 NOTE — ED Notes (Signed)
Patient taken to imaging. 

## 2022-11-04 NOTE — Group Note (Deleted)

## 2022-11-22 DIAGNOSIS — J0100 Acute maxillary sinusitis, unspecified: Secondary | ICD-10-CM | POA: Diagnosis not present

## 2022-11-22 DIAGNOSIS — E119 Type 2 diabetes mellitus without complications: Secondary | ICD-10-CM | POA: Diagnosis not present

## 2022-11-22 DIAGNOSIS — I10 Essential (primary) hypertension: Secondary | ICD-10-CM | POA: Diagnosis not present

## 2022-11-22 DIAGNOSIS — Z794 Long term (current) use of insulin: Secondary | ICD-10-CM | POA: Diagnosis not present

## 2022-11-22 DIAGNOSIS — M5412 Radiculopathy, cervical region: Secondary | ICD-10-CM | POA: Diagnosis not present

## 2022-11-22 DIAGNOSIS — Z9181 History of falling: Secondary | ICD-10-CM | POA: Diagnosis not present

## 2022-11-22 DIAGNOSIS — S12600A Unspecified displaced fracture of seventh cervical vertebra, initial encounter for closed fracture: Secondary | ICD-10-CM | POA: Diagnosis not present

## 2022-11-28 ENCOUNTER — Telehealth: Payer: Self-pay

## 2022-11-28 NOTE — Telephone Encounter (Signed)
Transition Care Management Unsuccessful Follow-up Telephone Call  Date of discharge and from where:  11/02/2022  Attempts:  1st Attempt  Reason for unsuccessful TCM follow-up call:  No answer/busy  [facility]ine [facility]  [facility], [facility] Guide Direct Dial: [phone]  Website: [email or web address]

## 2022-11-29 ENCOUNTER — Telehealth: Payer: Self-pay

## 2022-11-29 NOTE — Telephone Encounter (Signed)
Transition Care Management Unsuccessful Follow-up Telephone Call  Date of discharge and from where:  11/02/2022 [facility]  Attempts:  2nd Attempt  Reason for unsuccessful TCM follow-up call:  No answer/busy unable to leave message.  [name]  [facility], [facility] Guide Direct Dial: [phone]  Website: [email or web address]

## 2022-12-02 DIAGNOSIS — Z79899 Other long term (current) drug therapy: Secondary | ICD-10-CM | POA: Diagnosis not present

## 2022-12-02 DIAGNOSIS — G894 Chronic pain syndrome: Secondary | ICD-10-CM | POA: Diagnosis not present

## 2022-12-02 DIAGNOSIS — R42 Dizziness and giddiness: Secondary | ICD-10-CM | POA: Diagnosis not present

## 2022-12-02 DIAGNOSIS — Z856 Personal history of leukemia: Secondary | ICD-10-CM | POA: Diagnosis not present

## 2022-12-13 DIAGNOSIS — M545 Low back pain, unspecified: Secondary | ICD-10-CM | POA: Diagnosis not present

## 2022-12-13 DIAGNOSIS — M7918 Myalgia, other site: Secondary | ICD-10-CM | POA: Diagnosis not present

## 2022-12-13 DIAGNOSIS — G8929 Other chronic pain: Secondary | ICD-10-CM | POA: Diagnosis not present

## 2022-12-14 DIAGNOSIS — M419 Scoliosis, unspecified: Secondary | ICD-10-CM | POA: Diagnosis not present

## 2022-12-14 DIAGNOSIS — R42 Dizziness and giddiness: Secondary | ICD-10-CM | POA: Diagnosis not present

## 2022-12-14 DIAGNOSIS — S12601D Unspecified nondisplaced fracture of seventh cervical vertebra, subsequent encounter for fracture with routine healing: Secondary | ICD-10-CM | POA: Diagnosis not present

## 2022-12-14 DIAGNOSIS — C9211 Chronic myeloid leukemia, BCR/ABL-positive, in remission: Secondary | ICD-10-CM | POA: Diagnosis not present

## 2022-12-14 DIAGNOSIS — I10 Essential (primary) hypertension: Secondary | ICD-10-CM | POA: Diagnosis not present

## 2022-12-14 DIAGNOSIS — M65331 Trigger finger, right middle finger: Secondary | ICD-10-CM | POA: Diagnosis not present

## 2022-12-14 DIAGNOSIS — E785 Hyperlipidemia, unspecified: Secondary | ICD-10-CM | POA: Diagnosis not present

## 2022-12-14 DIAGNOSIS — G8929 Other chronic pain: Secondary | ICD-10-CM | POA: Diagnosis not present

## 2022-12-14 DIAGNOSIS — E119 Type 2 diabetes mellitus without complications: Secondary | ICD-10-CM | POA: Diagnosis not present

## 2022-12-17 DIAGNOSIS — M545 Low back pain, unspecified: Secondary | ICD-10-CM | POA: Diagnosis not present

## 2022-12-17 DIAGNOSIS — M5412 Radiculopathy, cervical region: Secondary | ICD-10-CM | POA: Diagnosis not present

## 2022-12-17 DIAGNOSIS — R413 Other amnesia: Secondary | ICD-10-CM | POA: Diagnosis not present

## 2022-12-17 DIAGNOSIS — E119 Type 2 diabetes mellitus without complications: Secondary | ICD-10-CM | POA: Diagnosis not present

## 2022-12-17 DIAGNOSIS — E1165 Type 2 diabetes mellitus with hyperglycemia: Secondary | ICD-10-CM | POA: Diagnosis not present

## 2022-12-17 DIAGNOSIS — M19011 Primary osteoarthritis, right shoulder: Secondary | ICD-10-CM | POA: Diagnosis not present

## 2022-12-17 DIAGNOSIS — M19012 Primary osteoarthritis, left shoulder: Secondary | ICD-10-CM | POA: Diagnosis not present

## 2022-12-17 DIAGNOSIS — Z981 Arthrodesis status: Secondary | ICD-10-CM | POA: Diagnosis not present

## 2022-12-17 DIAGNOSIS — Z79899 Other long term (current) drug therapy: Secondary | ICD-10-CM | POA: Diagnosis not present

## 2022-12-17 DIAGNOSIS — M25512 Pain in left shoulder: Secondary | ICD-10-CM | POA: Diagnosis not present

## 2022-12-17 DIAGNOSIS — M25511 Pain in right shoulder: Secondary | ICD-10-CM | POA: Diagnosis not present

## 2022-12-17 DIAGNOSIS — G8929 Other chronic pain: Secondary | ICD-10-CM | POA: Diagnosis not present

## 2022-12-17 DIAGNOSIS — I700 Atherosclerosis of aorta: Secondary | ICD-10-CM | POA: Diagnosis not present

## 2022-12-17 DIAGNOSIS — Z794 Long term (current) use of insulin: Secondary | ICD-10-CM | POA: Diagnosis not present

## 2022-12-17 DIAGNOSIS — M5011 Cervical disc disorder with radiculopathy,  high cervical region: Secondary | ICD-10-CM | POA: Diagnosis not present

## 2022-12-18 DIAGNOSIS — E785 Hyperlipidemia, unspecified: Secondary | ICD-10-CM | POA: Diagnosis not present

## 2022-12-18 DIAGNOSIS — S12601D Unspecified nondisplaced fracture of seventh cervical vertebra, subsequent encounter for fracture with routine healing: Secondary | ICD-10-CM | POA: Diagnosis not present

## 2022-12-18 DIAGNOSIS — E119 Type 2 diabetes mellitus without complications: Secondary | ICD-10-CM | POA: Diagnosis not present

## 2022-12-18 DIAGNOSIS — M419 Scoliosis, unspecified: Secondary | ICD-10-CM | POA: Diagnosis not present

## 2022-12-18 DIAGNOSIS — G8929 Other chronic pain: Secondary | ICD-10-CM | POA: Diagnosis not present

## 2022-12-18 DIAGNOSIS — R42 Dizziness and giddiness: Secondary | ICD-10-CM | POA: Diagnosis not present

## 2022-12-18 DIAGNOSIS — I10 Essential (primary) hypertension: Secondary | ICD-10-CM | POA: Diagnosis not present

## 2022-12-18 DIAGNOSIS — M65331 Trigger finger, right middle finger: Secondary | ICD-10-CM | POA: Diagnosis not present

## 2022-12-18 DIAGNOSIS — C9211 Chronic myeloid leukemia, BCR/ABL-positive, in remission: Secondary | ICD-10-CM | POA: Diagnosis not present

## 2022-12-18 DIAGNOSIS — R8290 Unspecified abnormal findings in urine: Secondary | ICD-10-CM | POA: Diagnosis not present

## 2022-12-19 ENCOUNTER — Other Ambulatory Visit: Payer: Self-pay | Admitting: Internal Medicine

## 2022-12-19 DIAGNOSIS — I10 Essential (primary) hypertension: Secondary | ICD-10-CM | POA: Diagnosis not present

## 2022-12-19 DIAGNOSIS — G8929 Other chronic pain: Secondary | ICD-10-CM | POA: Diagnosis not present

## 2022-12-19 DIAGNOSIS — M65331 Trigger finger, right middle finger: Secondary | ICD-10-CM | POA: Diagnosis not present

## 2022-12-19 DIAGNOSIS — M419 Scoliosis, unspecified: Secondary | ICD-10-CM | POA: Diagnosis not present

## 2022-12-19 DIAGNOSIS — E119 Type 2 diabetes mellitus without complications: Secondary | ICD-10-CM | POA: Diagnosis not present

## 2022-12-19 DIAGNOSIS — C9211 Chronic myeloid leukemia, BCR/ABL-positive, in remission: Secondary | ICD-10-CM | POA: Diagnosis not present

## 2022-12-19 DIAGNOSIS — E785 Hyperlipidemia, unspecified: Secondary | ICD-10-CM | POA: Diagnosis not present

## 2022-12-19 DIAGNOSIS — R413 Other amnesia: Secondary | ICD-10-CM

## 2022-12-19 DIAGNOSIS — S12601D Unspecified nondisplaced fracture of seventh cervical vertebra, subsequent encounter for fracture with routine healing: Secondary | ICD-10-CM | POA: Diagnosis not present

## 2022-12-19 DIAGNOSIS — R42 Dizziness and giddiness: Secondary | ICD-10-CM | POA: Diagnosis not present

## 2022-12-20 ENCOUNTER — Ambulatory Visit
Admission: RE | Admit: 2022-12-20 | Discharge: 2022-12-20 | Disposition: A | Discharge status: Home / Self Care | Payer: Medicare HMO | Source: Ambulatory Visit | Attending: Internal Medicine | Admitting: Internal Medicine

## 2022-12-20 DIAGNOSIS — R413 Other amnesia: Secondary | ICD-10-CM | POA: Insufficient documentation

## 2022-12-20 DIAGNOSIS — G319 Degenerative disease of nervous system, unspecified: Secondary | ICD-10-CM | POA: Diagnosis not present

## 2022-12-20 DIAGNOSIS — R410 Disorientation, unspecified: Secondary | ICD-10-CM | POA: Diagnosis not present

## 2022-12-20 DIAGNOSIS — R42 Dizziness and giddiness: Secondary | ICD-10-CM | POA: Diagnosis not present

## 2022-12-24 DIAGNOSIS — C9211 Chronic myeloid leukemia, BCR/ABL-positive, in remission: Secondary | ICD-10-CM | POA: Diagnosis not present

## 2022-12-24 DIAGNOSIS — E785 Hyperlipidemia, unspecified: Secondary | ICD-10-CM | POA: Diagnosis not present

## 2022-12-24 DIAGNOSIS — E119 Type 2 diabetes mellitus without complications: Secondary | ICD-10-CM | POA: Diagnosis not present

## 2022-12-24 DIAGNOSIS — I10 Essential (primary) hypertension: Secondary | ICD-10-CM | POA: Diagnosis not present

## 2022-12-24 DIAGNOSIS — G8929 Other chronic pain: Secondary | ICD-10-CM | POA: Diagnosis not present

## 2022-12-24 DIAGNOSIS — S12601D Unspecified nondisplaced fracture of seventh cervical vertebra, subsequent encounter for fracture with routine healing: Secondary | ICD-10-CM | POA: Diagnosis not present

## 2022-12-24 DIAGNOSIS — M419 Scoliosis, unspecified: Secondary | ICD-10-CM | POA: Diagnosis not present

## 2022-12-24 DIAGNOSIS — M65331 Trigger finger, right middle finger: Secondary | ICD-10-CM | POA: Diagnosis not present

## 2022-12-24 DIAGNOSIS — R42 Dizziness and giddiness: Secondary | ICD-10-CM | POA: Diagnosis not present

## 2022-12-26 DIAGNOSIS — R42 Dizziness and giddiness: Secondary | ICD-10-CM | POA: Diagnosis not present

## 2022-12-26 DIAGNOSIS — E119 Type 2 diabetes mellitus without complications: Secondary | ICD-10-CM | POA: Diagnosis not present

## 2022-12-26 DIAGNOSIS — S12601D Unspecified nondisplaced fracture of seventh cervical vertebra, subsequent encounter for fracture with routine healing: Secondary | ICD-10-CM | POA: Diagnosis not present

## 2022-12-26 DIAGNOSIS — C9211 Chronic myeloid leukemia, BCR/ABL-positive, in remission: Secondary | ICD-10-CM | POA: Diagnosis not present

## 2022-12-26 DIAGNOSIS — G8929 Other chronic pain: Secondary | ICD-10-CM | POA: Diagnosis not present

## 2022-12-26 DIAGNOSIS — M65331 Trigger finger, right middle finger: Secondary | ICD-10-CM | POA: Diagnosis not present

## 2022-12-26 DIAGNOSIS — E785 Hyperlipidemia, unspecified: Secondary | ICD-10-CM | POA: Diagnosis not present

## 2022-12-26 DIAGNOSIS — I10 Essential (primary) hypertension: Secondary | ICD-10-CM | POA: Diagnosis not present

## 2022-12-26 DIAGNOSIS — M419 Scoliosis, unspecified: Secondary | ICD-10-CM | POA: Diagnosis not present

## 2022-12-31 DIAGNOSIS — E785 Hyperlipidemia, unspecified: Secondary | ICD-10-CM | POA: Diagnosis not present

## 2022-12-31 DIAGNOSIS — I10 Essential (primary) hypertension: Secondary | ICD-10-CM | POA: Diagnosis not present

## 2022-12-31 DIAGNOSIS — E119 Type 2 diabetes mellitus without complications: Secondary | ICD-10-CM | POA: Diagnosis not present

## 2022-12-31 DIAGNOSIS — M419 Scoliosis, unspecified: Secondary | ICD-10-CM | POA: Diagnosis not present

## 2022-12-31 DIAGNOSIS — S12601D Unspecified nondisplaced fracture of seventh cervical vertebra, subsequent encounter for fracture with routine healing: Secondary | ICD-10-CM | POA: Diagnosis not present

## 2022-12-31 DIAGNOSIS — M65331 Trigger finger, right middle finger: Secondary | ICD-10-CM | POA: Diagnosis not present

## 2022-12-31 DIAGNOSIS — R42 Dizziness and giddiness: Secondary | ICD-10-CM | POA: Diagnosis not present

## 2022-12-31 DIAGNOSIS — G8929 Other chronic pain: Secondary | ICD-10-CM | POA: Diagnosis not present

## 2022-12-31 DIAGNOSIS — C9211 Chronic myeloid leukemia, BCR/ABL-positive, in remission: Secondary | ICD-10-CM | POA: Diagnosis not present

## 2023-01-01 DIAGNOSIS — E785 Hyperlipidemia, unspecified: Secondary | ICD-10-CM | POA: Diagnosis not present

## 2023-01-01 DIAGNOSIS — S12601D Unspecified nondisplaced fracture of seventh cervical vertebra, subsequent encounter for fracture with routine healing: Secondary | ICD-10-CM | POA: Diagnosis not present

## 2023-01-01 DIAGNOSIS — C9211 Chronic myeloid leukemia, BCR/ABL-positive, in remission: Secondary | ICD-10-CM | POA: Diagnosis not present

## 2023-01-01 DIAGNOSIS — E119 Type 2 diabetes mellitus without complications: Secondary | ICD-10-CM | POA: Diagnosis not present

## 2023-01-01 DIAGNOSIS — M419 Scoliosis, unspecified: Secondary | ICD-10-CM | POA: Diagnosis not present

## 2023-01-01 DIAGNOSIS — M65331 Trigger finger, right middle finger: Secondary | ICD-10-CM | POA: Diagnosis not present

## 2023-01-01 DIAGNOSIS — R42 Dizziness and giddiness: Secondary | ICD-10-CM | POA: Diagnosis not present
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
## 2023-01-08 ENCOUNTER — Other Ambulatory Visit: Payer: Self-pay | Admitting: Internal Medicine

## 2023-01-08 DIAGNOSIS — E785 Hyperlipidemia, unspecified: Secondary | ICD-10-CM | POA: Diagnosis not present

## 2023-01-08 DIAGNOSIS — G8929 Other chronic pain: Secondary | ICD-10-CM | POA: Diagnosis not present

## 2023-01-08 DIAGNOSIS — I10 Essential (primary) hypertension: Secondary | ICD-10-CM | POA: Diagnosis not present

## 2023-01-08 DIAGNOSIS — R42 Dizziness and giddiness: Secondary | ICD-10-CM | POA: Diagnosis not present

## 2023-01-08 DIAGNOSIS — E119 Type 2 diabetes mellitus without complications: Secondary | ICD-10-CM | POA: Diagnosis not present

## 2023-01-08 DIAGNOSIS — M65331 Trigger finger, right middle finger: Secondary | ICD-10-CM | POA: Diagnosis not present

## 2023-01-08 DIAGNOSIS — C9211 Chronic myeloid leukemia, BCR/ABL-positive, in remission: Secondary | ICD-10-CM | POA: Diagnosis not present

## 2023-01-08 DIAGNOSIS — S12601D Unspecified nondisplaced fracture of seventh cervical vertebra, subsequent encounter for fracture with routine healing: Secondary | ICD-10-CM | POA: Diagnosis not present

## 2023-01-08 DIAGNOSIS — M419 Scoliosis, unspecified: Secondary | ICD-10-CM | POA: Diagnosis not present

## 2023-01-09 ENCOUNTER — Ambulatory Visit (INDEPENDENT_AMBULATORY_CARE_PROVIDER_SITE_OTHER): Payer: Medicare HMO | Admitting: Psychiatry

## 2023-01-09 ENCOUNTER — Encounter: Payer: Self-pay | Admitting: Psychiatry

## 2023-01-09 VITALS — BP 114/78 | HR 94 | Temp 96.2°F | Ht 67.0 in | Wt 223.0 lb

## 2023-01-09 DIAGNOSIS — F419 Anxiety disorder, unspecified: Secondary | ICD-10-CM

## 2023-01-09 DIAGNOSIS — G4700 Insomnia, unspecified: Secondary | ICD-10-CM | POA: Diagnosis not present

## 2023-01-09 DIAGNOSIS — F331 Major depressive disorder, recurrent, moderate: Secondary | ICD-10-CM

## 2023-01-09 MED ORDER — BUSPIRONE HCL 5 MG PO TABS: 5.0000 mg | ORAL_TABLET | Freq: Two times a day (BID) | ORAL | 2 refills | Status: DC

## 2023-01-09 MED ORDER — PREGABALIN 300 MG PO CAPS: 300.0000 mg | ORAL_CAPSULE | Freq: Two times a day (BID) | ORAL | 0 refills | Status: DC

## 2023-01-09 MED ORDER — ALPRAZOLAM 0.5 MG PO TABS: 0.5000 mg | ORAL_TABLET | Freq: Three times a day (TID) | ORAL | 2 refills | Status: DC | PRN

## 2023-01-09 NOTE — Patient Instructions (Addendum)
Continue duloxetine 20 mg four times a day  Continue buspar 5 mg twice a day  Decrease xanax 0.5 mg three times a day as needed for anxiety  Referral for evaluation of insomnia Restart pregabalin 300 mg twice a day  Next appointment: 1/16 at 1 pm

## 2023-01-13 DIAGNOSIS — E785 Hyperlipidemia, unspecified: Secondary | ICD-10-CM | POA: Diagnosis not present

## 2023-01-13 DIAGNOSIS — G8929 Other chronic pain: Secondary | ICD-10-CM | POA: Diagnosis not present

## 2023-01-13 DIAGNOSIS — R42 Dizziness and giddiness: Secondary | ICD-10-CM | POA: Diagnosis not present

## 2023-01-13 DIAGNOSIS — C9211 Chronic myeloid leukemia, BCR/ABL-positive, in remission: Secondary | ICD-10-CM | POA: Diagnosis not present

## 2023-01-13 DIAGNOSIS — S12601D Unspecified nondisplaced fracture of seventh cervical vertebra, subsequent encounter for fracture with routine healing: Secondary | ICD-10-CM | POA: Diagnosis not present

## 2023-01-13 DIAGNOSIS — M65331 Trigger finger, right middle finger: Secondary | ICD-10-CM | POA: Diagnosis not present

## 2023-01-13 DIAGNOSIS — E119 Type 2 diabetes mellitus without complications: Secondary | ICD-10-CM | POA: Diagnosis not present

## 2023-01-13 DIAGNOSIS — I10 Essential (primary) hypertension: Secondary | ICD-10-CM | POA: Diagnosis not present

## 2023-01-13 DIAGNOSIS — M419 Scoliosis, unspecified: Secondary | ICD-10-CM | POA: Diagnosis not present

## 2023-01-16 ENCOUNTER — Encounter: Payer: Self-pay | Admitting: Internal Medicine

## 2023-01-16 DIAGNOSIS — R42 Dizziness and giddiness: Secondary | ICD-10-CM | POA: Diagnosis not present

## 2023-01-16 DIAGNOSIS — G8929 Other chronic pain: Secondary | ICD-10-CM | POA: Diagnosis not present

## 2023-01-16 DIAGNOSIS — M65331 Trigger finger, right middle finger: Secondary | ICD-10-CM | POA: Diagnosis not present

## 2023-01-16 DIAGNOSIS — S12601D Unspecified nondisplaced fracture of seventh cervical vertebra, subsequent encounter for fracture with routine healing: Secondary | ICD-10-CM | POA: Diagnosis not present

## 2023-01-16 DIAGNOSIS — E785 Hyperlipidemia, unspecified: Secondary | ICD-10-CM | POA: Diagnosis not present

## 2023-01-16 DIAGNOSIS — I10 Essential (primary) hypertension: Secondary | ICD-10-CM | POA: Diagnosis not present

## 2023-01-16 DIAGNOSIS — C9211 Chronic myeloid leukemia, BCR/ABL-positive, in remission: Secondary | ICD-10-CM | POA: Diagnosis not present

## 2023-01-16 DIAGNOSIS — M419 Scoliosis, unspecified: Secondary | ICD-10-CM | POA: Diagnosis not present

## 2023-01-16 DIAGNOSIS — E119 Type 2 diabetes mellitus without complications: Secondary | ICD-10-CM | POA: Diagnosis not present

## 2023-01-17 ENCOUNTER — Other Ambulatory Visit: Payer: Medicare HMO

## 2023-01-17 ENCOUNTER — Inpatient Hospital Stay: Payer: Medicare HMO | Attending: Oncology

## 2023-01-17 DIAGNOSIS — E119 Type 2 diabetes mellitus without complications: Secondary | ICD-10-CM | POA: Diagnosis not present

## 2023-01-17 DIAGNOSIS — R42 Dizziness and giddiness: Secondary | ICD-10-CM | POA: Diagnosis not present

## 2023-01-17 DIAGNOSIS — M65331 Trigger finger, right middle finger: Secondary | ICD-10-CM | POA: Diagnosis not present

## 2023-01-17 DIAGNOSIS — S12601D Unspecified nondisplaced fracture of seventh cervical vertebra, subsequent encounter for fracture with routine healing: Secondary | ICD-10-CM | POA: Diagnosis not present

## 2023-01-17 DIAGNOSIS — C9211 Chronic myeloid leukemia, BCR/ABL-positive, in remission: Secondary | ICD-10-CM | POA: Diagnosis not present

## 2023-01-17 DIAGNOSIS — G8929 Other chronic pain: Secondary | ICD-10-CM | POA: Diagnosis not present

## 2023-01-17 DIAGNOSIS — M419 Scoliosis, unspecified: Secondary | ICD-10-CM | POA: Diagnosis not present

## 2023-01-17 DIAGNOSIS — E785 Hyperlipidemia, unspecified: Secondary | ICD-10-CM | POA: Diagnosis not present

## 2023-01-17 DIAGNOSIS — I10 Essential (primary) hypertension: Secondary | ICD-10-CM | POA: Diagnosis not present

## 2023-01-21 ENCOUNTER — Ambulatory Visit: Payer: Medicare HMO | Admitting: Psychiatry

## 2023-01-22 ENCOUNTER — Telehealth: Payer: Self-pay

## 2023-01-22 ENCOUNTER — Telehealth: Payer: Self-pay | Admitting: Oncology

## 2023-01-22 NOTE — Telephone Encounter (Signed)
Left vm about appt changes.   Pt missed lab prior (11/22) and was scheduled for mychart visit (12/2) to go over those results.  Per RN, appts needed to be r/s. I rescheduled the appts and left a vm for pt.

## 2023-01-22 NOTE — Telephone Encounter (Signed)
Pt no showed to labs on 11/22 and is scheduled for virtual visit with Dr. Cathie Hoops on 12/4.   Please r/s labs and virtual visit to be 10 days after labs.

## 2023-01-27 ENCOUNTER — Inpatient Hospital Stay: Payer: Medicare HMO | Admitting: Oncology

## 2023-01-27 DIAGNOSIS — G8929 Other chronic pain: Secondary | ICD-10-CM | POA: Diagnosis not present

## 2023-01-27 DIAGNOSIS — M65331 Trigger finger, right middle finger: Secondary | ICD-10-CM | POA: Diagnosis not present

## 2023-01-27 DIAGNOSIS — R42 Dizziness and giddiness: Secondary | ICD-10-CM | POA: Diagnosis not present

## 2023-01-27 DIAGNOSIS — M419 Scoliosis, unspecified: Secondary | ICD-10-CM | POA: Diagnosis not present

## 2023-01-27 DIAGNOSIS — C9211 Chronic myeloid leukemia, BCR/ABL-positive, in remission: Secondary | ICD-10-CM | POA: Diagnosis not present

## 2023-01-27 DIAGNOSIS — S12601D Unspecified nondisplaced fracture of seventh cervical vertebra, subsequent encounter for fracture with routine healing: Secondary | ICD-10-CM | POA: Diagnosis not present

## 2023-01-27 DIAGNOSIS — I10 Essential (primary) hypertension: Secondary | ICD-10-CM | POA: Diagnosis not present

## 2023-01-27 DIAGNOSIS — E119 Type 2 diabetes mellitus without complications: Secondary | ICD-10-CM | POA: Diagnosis not present

## 2023-01-27 DIAGNOSIS — E785 Hyperlipidemia, unspecified: Secondary | ICD-10-CM | POA: Diagnosis not present

## 2023-01-28 DIAGNOSIS — E785 Hyperlipidemia, unspecified: Secondary | ICD-10-CM | POA: Diagnosis not present

## 2023-01-28 DIAGNOSIS — R42 Dizziness and giddiness: Secondary | ICD-10-CM | POA: Diagnosis not present

## 2023-01-28 DIAGNOSIS — E119 Type 2 diabetes mellitus without complications: Secondary | ICD-10-CM | POA: Diagnosis not present

## 2023-01-28 DIAGNOSIS — G8929 Other chronic pain: Secondary | ICD-10-CM | POA: Diagnosis not present

## 2023-01-28 DIAGNOSIS — S12601D Unspecified nondisplaced fracture of seventh cervical vertebra, subsequent encounter for fracture with routine healing: Secondary | ICD-10-CM | POA: Diagnosis not present

## 2023-01-28 DIAGNOSIS — C9211 Chronic myeloid leukemia, BCR/ABL-positive, in remission: Secondary | ICD-10-CM | POA: Diagnosis not present

## 2023-01-28 DIAGNOSIS — I10 Essential (primary) hypertension: Secondary | ICD-10-CM | POA: Diagnosis not present

## 2023-01-28 DIAGNOSIS — M419 Scoliosis, unspecified: Secondary | ICD-10-CM | POA: Diagnosis not present

## 2023-01-28 DIAGNOSIS — M65331 Trigger finger, right middle finger: Secondary | ICD-10-CM | POA: Diagnosis not present

## 2023-02-07 DIAGNOSIS — M7918 Myalgia, other site: Secondary | ICD-10-CM | POA: Diagnosis not present

## 2023-02-07 DIAGNOSIS — M545 Low back pain, unspecified: Secondary | ICD-10-CM | POA: Diagnosis not present

## 2023-02-07 DIAGNOSIS — G8929 Other chronic pain: Secondary | ICD-10-CM | POA: Diagnosis not present

## 2023-02-11 ENCOUNTER — Other Ambulatory Visit (HOSPITAL_COMMUNITY): Payer: Self-pay

## 2023-02-11 DIAGNOSIS — E119 Type 2 diabetes mellitus without complications: Secondary | ICD-10-CM | POA: Diagnosis not present

## 2023-02-11 DIAGNOSIS — H6592 Unspecified nonsuppurative otitis media, left ear: Secondary | ICD-10-CM | POA: Diagnosis not present

## 2023-02-11 DIAGNOSIS — I10 Essential (primary) hypertension: Secondary | ICD-10-CM | POA: Diagnosis not present

## 2023-02-11 DIAGNOSIS — R0981 Nasal congestion: Secondary | ICD-10-CM | POA: Diagnosis not present

## 2023-02-24 ENCOUNTER — Inpatient Hospital Stay: Payer: Medicare HMO | Attending: Oncology

## 2023-03-03 ENCOUNTER — Telehealth: Payer: Self-pay

## 2023-03-03 NOTE — Telephone Encounter (Signed)
 Pt scheduled for Mychart visit on 1/9. She no showed to labs on 12/30. She will need lab & visit before any more medication (Bosulif) can be refilled.    Please r/s with labs at least 1 week prior to Mychart visit.

## 2023-03-05 ENCOUNTER — Other Ambulatory Visit (HOSPITAL_COMMUNITY): Payer: Self-pay

## 2023-03-05 ENCOUNTER — Telehealth: Payer: Self-pay

## 2023-03-05 NOTE — Telephone Encounter (Signed)
 Oral Oncology Patient Advocate Encounter  Re-authorization   Received notification that prior authorization for Bosulif  is due for renewal.   PA submitted on 03/05/23  Key [id number]  Status is pending     Morene Potters, CPhT Oncology Pharmacy Patient Advocate  [facility]  [phone] (phone) [phone] (fax) 03/05/2023 11:34 AM

## 2023-03-05 NOTE — Telephone Encounter (Signed)
 Oral Oncology Patient Advocate Encounter  Prior Authorization for Bosulif  has been approved.    PA# [id number]  Effective dates: 02/26/23 through 02/25/24  Patients co-pay is $0.00.    Morene Potters, CPhT Oncology Pharmacy Patient Advocate  [facility]  [phone] (phone) [phone] (fax) 03/05/2023 3:37 PM

## 2023-03-06 ENCOUNTER — Inpatient Hospital Stay: Payer: Medicare HMO | Admitting: Oncology
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
## 2023-03-13 ENCOUNTER — Encounter: Payer: Self-pay | Admitting: Psychiatry

## 2023-03-13 ENCOUNTER — Telehealth: Payer: Medicare HMO | Admitting: Psychiatry

## 2023-03-13 DIAGNOSIS — F419 Anxiety disorder, unspecified: Secondary | ICD-10-CM

## 2023-03-13 DIAGNOSIS — F331 Major depressive disorder, recurrent, moderate: Secondary | ICD-10-CM

## 2023-03-13 DIAGNOSIS — F1320 Sedative, hypnotic or anxiolytic dependence, uncomplicated: Secondary | ICD-10-CM

## 2023-03-13 DIAGNOSIS — G4700 Insomnia, unspecified: Secondary | ICD-10-CM

## 2023-03-13 MED ORDER — ALPRAZOLAM 0.5 MG PO TABS: 0.5000 mg | ORAL_TABLET | Freq: Three times a day (TID) | ORAL | 1 refills | Status: AC | PRN

## 2023-03-24 ENCOUNTER — Inpatient Hospital Stay: Payer: Medicare HMO

## 2023-03-31 ENCOUNTER — Inpatient Hospital Stay: Payer: Medicare HMO | Attending: Oncology

## 2023-04-03 ENCOUNTER — Telehealth: Payer: Medicare HMO | Admitting: Oncology

## 2023-04-10 ENCOUNTER — Inpatient Hospital Stay: Payer: Medicare HMO | Admitting: Oncology

## 2023-04-11 DIAGNOSIS — M545 Low back pain, unspecified: Secondary | ICD-10-CM | POA: Diagnosis not present

## 2023-04-11 DIAGNOSIS — M7918 Myalgia, other site: Secondary | ICD-10-CM | POA: Diagnosis not present

## 2023-04-11 DIAGNOSIS — M25562 Pain in left knee: Secondary | ICD-10-CM | POA: Diagnosis not present

## 2023-04-11 DIAGNOSIS — G8929 Other chronic pain: Secondary | ICD-10-CM | POA: Diagnosis not present

## 2023-04-11 DIAGNOSIS — M25561 Pain in right knee: Secondary | ICD-10-CM | POA: Diagnosis not present

## 2023-04-14 ENCOUNTER — Other Ambulatory Visit: Payer: Medicare HMO

## 2023-04-14 ENCOUNTER — Inpatient Hospital Stay: Payer: Medicare HMO

## 2023-04-16 ENCOUNTER — Ambulatory Visit (INDEPENDENT_AMBULATORY_CARE_PROVIDER_SITE_OTHER): Payer: Medicare HMO | Admitting: Professional Counselor

## 2023-04-16 DIAGNOSIS — Z91199 Patient's noncompliance with other medical treatment and regimen due to unspecified reason: Secondary | ICD-10-CM

## 2023-04-16 NOTE — Progress Notes (Signed)
 Patient no-showed today's appointment; appointment was for 04/16/23 at 1 PM to establish outpatient therapy. Therapist sent link to [phone] and patient did not sign in for virtual appointment.

## 2023-04-19 NOTE — Progress Notes (Unsigned)
Sent a video visit link through Epic, but the patient didn't sign in. Tried calling for today's appointment, but got no answer. Left a voicemail instructing the patient to contact the office at [phone].  

## 2023-04-23 ENCOUNTER — Telehealth (INDEPENDENT_AMBULATORY_CARE_PROVIDER_SITE_OTHER): Payer: Self-pay | Admitting: Psychiatry

## 2023-04-23 ENCOUNTER — Other Ambulatory Visit: Payer: Self-pay | Admitting: Psychiatry

## 2023-04-23 ENCOUNTER — Telehealth: Payer: Self-pay | Admitting: Psychiatry

## 2023-04-23 DIAGNOSIS — Z91199 Patient's noncompliance with other medical treatment and regimen due to unspecified reason: Secondary | ICD-10-CM

## 2023-04-23 MED ORDER — ALPRAZOLAM 0.5 MG PO TABS: 0.5000 mg | ORAL_TABLET | Freq: Three times a day (TID) | ORAL | 0 refills | Status: DC | PRN

## 2023-04-23 MED ORDER — DULOXETINE HCL 20 MG PO CPEP: 20.0000 mg | ORAL_CAPSULE | Freq: Four times a day (QID) | ORAL | 3 refills | Status: AC

## 2023-04-23 NOTE — Telephone Encounter (Signed)
 Ordered

## 2023-04-23 NOTE — Telephone Encounter (Signed)
 Ordered Xanax. Please remind her that the next Xanax fill date would be on 3/17, as she appeared to have filled the previous one earlier in February (filled 30-day supplies on 1/17 and 2/12). Also, please check if she needs a refill of Duloxetine. Thanks.

## 2023-04-23 NOTE — Telephone Encounter (Signed)
 Spoke to patient made aware of the message she voiced understanding and stated that she does need a refill of the Duloxetine please advise

## 2023-04-29 ENCOUNTER — Other Ambulatory Visit (HOSPITAL_COMMUNITY): Payer: Self-pay

## 2023-04-29 ENCOUNTER — Telehealth: Payer: Medicare HMO | Admitting: Oncology

## 2023-04-29 ENCOUNTER — Encounter: Payer: Self-pay | Admitting: Pharmacist

## 2023-04-29 NOTE — Progress Notes (Signed)
 Due to previous no shows and rescheduling, MD would like to wait until her next appt to send in new Bosulif rx.  At that time patient with establish filling with [facility]

## 2023-05-01 ENCOUNTER — Telehealth: Payer: Self-pay | Admitting: *Deleted

## 2023-05-01 NOTE — Telephone Encounter (Signed)
 Pt. States that she needs her meds  bonsulif and she needs help to pay for it. I spoked to patty and alyson about the pt. I was told because the patient is frequently no show for her appts. Dr. Cathie Hoops states she will not get refill until she come in to office. She has an appt 3/15 for labs and see MD on  3/26 as video 2:45. She states that she needs her meds. I told her that you not coming to the appts . Dr. Cathie Hoops can't make changes in your drugs if she does  know how your doing so you will need to get lab I already said to pt. She is thinking that she may be able to get son to bring her to the cancer center in afew days. I told her tht she needs to call in and tell us a date so we can get it set up. Please do not walk in the building without an appt.She says she will speak to son and let me know.

## 2023-05-12 ENCOUNTER — Inpatient Hospital Stay: Payer: Medicare HMO | Attending: Oncology

## 2023-05-12 ENCOUNTER — Inpatient Hospital Stay (HOSPITAL_BASED_OUTPATIENT_CLINIC_OR_DEPARTMENT_OTHER): Admitting: Oncology

## 2023-05-12 ENCOUNTER — Encounter: Payer: Self-pay | Admitting: Oncology

## 2023-05-12 VITALS — BP 98/67 | HR 84 | Temp 96.1°F | Resp 18

## 2023-05-12 DIAGNOSIS — F32A Depression, unspecified: Secondary | ICD-10-CM

## 2023-05-12 DIAGNOSIS — Z8042 Family history of malignant neoplasm of prostate: Secondary | ICD-10-CM | POA: Diagnosis not present

## 2023-05-12 DIAGNOSIS — R61 Generalized hyperhidrosis: Secondary | ICD-10-CM | POA: Diagnosis not present

## 2023-05-12 DIAGNOSIS — Z79899 Other long term (current) drug therapy: Secondary | ICD-10-CM | POA: Insufficient documentation

## 2023-05-12 DIAGNOSIS — R5383 Other fatigue: Secondary | ICD-10-CM | POA: Insufficient documentation

## 2023-05-12 DIAGNOSIS — Z5111 Encounter for antineoplastic chemotherapy: Secondary | ICD-10-CM

## 2023-05-12 DIAGNOSIS — F419 Anxiety disorder, unspecified: Secondary | ICD-10-CM | POA: Diagnosis not present

## 2023-05-12 DIAGNOSIS — C9210 Chronic myeloid leukemia, BCR/ABL-positive, not having achieved remission: Secondary | ICD-10-CM

## 2023-05-12 DIAGNOSIS — C9212 Chronic myeloid leukemia, BCR/ABL-positive, in relapse: Secondary | ICD-10-CM | POA: Diagnosis not present

## 2023-05-12 DIAGNOSIS — L02419 Cutaneous abscess of limb, unspecified: Secondary | ICD-10-CM | POA: Insufficient documentation

## 2023-05-12 DIAGNOSIS — R634 Abnormal weight loss: Secondary | ICD-10-CM | POA: Insufficient documentation

## 2023-05-12 LAB — CBC WITH DIFFERENTIAL (CANCER CENTER ONLY)
Abs Immature Granulocytes: 0.14 10*3/uL — ABNORMAL HIGH (ref 0.00–0.07)
Basophils Absolute: 0.1 10*3/uL (ref 0.0–0.1)
Basophils Relative: 0 %
Eosinophils Absolute: 0.1 10*3/uL (ref 0.0–0.5)
Eosinophils Relative: 1 %
HCT: 37.3 % (ref 36.0–46.0)
Hemoglobin: 12.6 g/dL (ref 12.0–15.0)
Immature Granulocytes: 1 %
Lymphocytes Relative: 7 %
Lymphs Abs: 0.8 10*3/uL (ref 0.7–4.0)
MCH: 28.9 pg (ref 26.0–34.0)
MCHC: 33.8 g/dL (ref 30.0–36.0)
MCV: 85.6 fL (ref 80.0–100.0)
Monocytes Absolute: 0.8 10*3/uL (ref 0.1–1.0)
Monocytes Relative: 7 %
Neutro Abs: 9.9 10*3/uL — ABNORMAL HIGH (ref 1.7–7.7)
Neutrophils Relative %: 84 %
Platelet Count: 274 10*3/uL (ref 150–400)
RBC: 4.36 MIL/uL (ref 3.87–5.11)
RDW: 13.5 % (ref 11.5–15.5)
WBC Count: 11.8 10*3/uL — ABNORMAL HIGH (ref 4.0–10.5)
nRBC: 0 % (ref 0.0–0.2)

## 2023-05-12 LAB — CMP (CANCER CENTER ONLY)
ALT: 13 U/L (ref 0–44)
AST: 19 U/L (ref 15–41)
Albumin: 3 g/dL — ABNORMAL LOW (ref 3.5–5.0)
Alkaline Phosphatase: 63 U/L (ref 38–126)
Anion gap: 11 (ref 5–15)
BUN: 9 mg/dL (ref 8–23)
CO2: 25 mmol/L (ref 22–32)
Calcium: 8.9 mg/dL (ref 8.9–10.3)
Chloride: 98 mmol/L (ref 98–111)
Creatinine: 0.75 mg/dL (ref 0.44–1.00)
GFR, Estimated: >60 mL/min (ref >60)
Glucose, Bld: 137 mg/dL — ABNORMAL HIGH (ref 70–99)
Potassium: 3.4 mmol/L — ABNORMAL LOW (ref 3.5–5.1)
Sodium: 134 mmol/L — ABNORMAL LOW (ref 135–145)
Total Bilirubin: 0.6 mg/dL (ref 0.0–1.2)
Total Protein: 6.4 g/dL — ABNORMAL LOW (ref 6.5–8.1)

## 2023-05-12 LAB — VITAMIN B12: Vitamin B-12: 992 pg/mL — ABNORMAL HIGH (ref 180–914)

## 2023-05-12 MED ORDER — DOXYCYCLINE HYCLATE 100 MG PO TABS: 100.0000 mg | ORAL_TABLET | Freq: Two times a day (BID) | ORAL | 0 refills | Status: DC

## 2023-05-12 MED ORDER — BOSUTINIB 100 MG PO TABS
400.0000 mg | ORAL_TABLET | Freq: Every day | ORAL | 3 refills | Status: DC
Start: 2023-05-12 — End: 2023-08-26
  Filled 2023-05-13: qty 120, 30d supply, fill #0
  Filled 2023-06-05: qty 120, 30d supply, fill #1
  Filled 2023-07-04: qty 120, 30d supply, fill #2
  Filled 2023-08-04: qty 120, 30d supply, fill #3

## 2023-05-12 NOTE — Progress Notes (Signed)
 Hematology/Oncology progress note [phone] [phone]      Clinic Day:  05/12/2023  Referring physician: Enid Baas, MD   ASSESSMENT & PLAN:   CML (chronic myelocytic leukemia) (HCC) #CML, chronic phase, 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 02/20/2021, BCR ABL 1 FISH is negative.b3 a2 transcript 0.27%. 08/03/2021, BCR-ABL1 FISH is negative,b3 a2 transcript 0.0521%. 10/26/2021 BCR-ABL1 FISH is negative,b3 a2 transcript 0.0458% 01/31/2022 BCR-ABL1 FISH is negative,b3 a2 transcript 0.0251% 05/17/2022 BCR-ABL1 FISH is negative,b3 a2 transcript 0.0410% 09/06/2022 BCR-ABL1 FISH is negative,b3 a2 transcript negative  Today's blood work is pending.  Recommend patient to continue bosutinib,continue 400mg  daily  Refill prescription has been sent to pharmacy.  Encounter for antineoplastic chemotherapy Treatment plan as listed above.   Abscess of axillary region Given the size of skin infection and her immunocompromised state, recommend patient to go to emergency room for further evaluation.  She may need I&D She adamantly declines and prefers to trial a course of antibiotics. I recommend patient to take doxycycline 100 mg twice daily for 7 days.  I urged patient to make a follow-up appointment with her primary care provider for further evaluation.  If symptom gets worse recommend patient to consider go to emergency room  Anxiety and depression Follow-up with psychiatrist  Orders Placed This Encounter  Procedures   BCR-ABL1, CML/ALL, PCR, QUANT    Standing Status:   Future    Expected Date:   08/12/2023    Expiration Date:   05/11/2024   BCR-ABL1 FISH    Standing Status:   Future    Expected Date:   08/12/2023    Expiration Date:   05/11/2024   CBC with Differential (Cancer Center Only)    Standing Status:   Future    Expected Date:   08/12/2023    Expiration Date:   05/11/2024    CMP (Cancer Center only)    Standing Status:   Future    Expected Date:   08/12/2023    Expiration Date:   05/11/2024   Follow-up in 3 months All questions were answered. The patient knows to call the clinic with any problems, questions or concerns.  Rickard Patience, MD, PhD [facility] Hematology Oncology 05/12/2023   Chief Complaint: Tara Levine is a 68 y.o. female presents for follow up of chronic myelogenous leukemia (CML)   PERTINENT ONCOLOGY HISTORY Patient previously followed up by Dr.Finnegan and Dr.Corcoran, patient switched care to me on 01/12/21 Extensive medical record review was performed by me  Patient was initially seen by Dr. Lenox Ponds at and Dr. Teodoro Spray.  She was seen by Dr. Merlene Pulling on 12/28/2018. CML was diagnosed in 2011. Bone marrow on 11/2009 revealed CML in chronic phase with no increase in blast (4%).  B3a2 transcript was 60% (p210).   11/2009, patient was started on nilotinib 300 mg twice daily.  Because of weight gain and intolerance to neratinib, dose was reduced to 150 mg twice daily. 12/2011 BCR ABL IS 0.11% 02/2012 nilotinib was increased to 600 mg twice daily 05/2012 patient was intubated in ICU for possible aspiration, narcotic effects, cardiac arrest and septic shock. nilotinib was held.  Restarted at a dose of 200 mg p.o. daily with dose escalation beginning May 2014. 08/2012 BCR ABL 0.55%  03/2013 patient was switched to bosutinib 400 mg daily. Patient was noticed to have medical noncompliance, missing multiple appointments. Concern was raised about an increasing BCR ABL transcript.  06/12/2017, patient was  seen by [facility] Dr. Cindra Presume for second opinion. Per note, patient continued bosutinib and to she lost her insurance in July 2019. BCR-ABLR was < 0.0032% therefore Dr. Merlene Pulling recommend patient to stay off bosutinib.  Patient then lost follow-up after that appointment.  Patient has psychiatric issues, major depression and anxiety..  She is very  anxious in the clinic today.  Accompanied by [name]. Patient reports extremely fatigued lately.  Also night sweats.  She restarted bosutinib using leftover supply and stayed on the medication for months.  Last taking bosutinib a week ago. She lives at home by herself.  Widowed 10/15/2020, hemoglobin 7.5, MCV 89.1.  White count 6.0.  Platelet 267. Creatinine 1.44   INTERVAL HISTORY Tara Levine is a 68 y.o. female who has above history reviewed by me today presents for follow up visit for management of CML Patient has been bosutinib 400 mg daily.  She ran out of medication 3 weeks ago.  She has no showed multiple times to her follow-up appointments.  Last seen virtually in July 2024. Today patient also report a tender erythematous in her left axillary area. She noticed 5 days ago.  Initially attributing it to a 'rope burn' from a twisted gown. Over the past five days, the mass has progressively enlarged, becoming hard and approximately the size of a 'whole handful'. The redness from the mass has extended into her breast tissue, causing difficulty wearing a bra due to its size.  She experiences fever with temperatures reaching 101F to 102F, managed with aspirin and Tylenol. She also reports chills and nausea, but no vomiting.  She has had weight loss since the start of Mounjaro   Past Medical History:  Diagnosis Date   CML (chronic myelocytic leukemia) (HCC)    Depression    DM type 2 (diabetes mellitus, type 2) (HCC)    Environmental allergies    History of gastric ulcer    HLD (hyperlipidemia)    IDA (iron deficiency anemia)    chronic   Leukemia (HCC)    Leukemia, chronic myeloid (HCC)    Osteoarthritis    Panic disorder    PSS (progressive systemic sclerosis) (HCC)    Unspecified essential hypertension     Past Surgical History:  Procedure Laterality Date   back and neck surgery  1999, 2009   had rods placed   CARDIAC CATHETERIZATION  1990   [facility]) No blackages found.    CARPAL TUNNEL RELEASE     PARTIAL HYSTERECTOMY     age 84, no cancer    Family History  Problem Relation Age of Onset   Diabetes Mother    Hypertension Mother    Heart disease Mother    Prostate cancer Father    Heart disease Father    Diabetes Brother     Social History:  reports that she has never smoked. She has never used smokeless tobacco. She reports that she does not drink alcohol and does not use drugs.  Patient is widowed.  Lives by herself.  Allergies:  Allergies  Allergen Reactions   Morphine Rash    Headache too   Codeine Other (See Comments)    Reaction: Unknown    Latex Other (See Comments)    Reaction:  Unknown    Morphine And Codeine Other (See Comments)    Reaction:  Unknown    Relafen [Nabumetone] Hives   Nsaids Rash   Tape Other (See Comments) and Rash    Reaction:  Unknown     Current Medications: Current Outpatient Medications  Medication Sig Dispense Refill   ACCU-CHEK GUIDE test strip TEST DAILY AND AS DIRECTED. 300 strip 10   Accu-Chek Softclix Lancets lancets TEST BLOOD SUGAR THREE TIMES DAILY AS INSTRUCTED 300 each 10   albuterol (PROVENTIL HFA;VENTOLIN HFA) 108 (90 BASE) MCG/ACT inhaler Inhale 2 puffs into the lungs every 6 (six) hours as needed for wheezing or shortness of breath.     albuterol (PROVENTIL) (2.5 MG/3ML) 0.083% nebulizer solution INHALE CONTENTS OF 1 VIAL VIA NEBULIZER THREE TIMES A DAY prn     Alcohol Swabs (DROPSAFE ALCOHOL PREP) 70 % PADS USE THREE TIMES DAILY  TO CHECK BLOOD SUGAR 300 each 10   ALPRAZolam (XANAX) 0.5 MG tablet Take 1 tablet (0.5 mg total) by mouth 3 (three) times daily as needed for anxiety. 90 tablet 1   ALPRAZolam (XANAX) 0.5 MG tablet Take 1 tablet (0.5 mg total) by mouth 3 (three) times daily as needed for anxiety. 90 tablet 0   amLODipine (NORVASC) 5 MG tablet Take 1 tablet (5 mg total) by mouth daily. 90 tablet 0   Ascorbic Acid (VITAMIN C) 1000 MG tablet Take  3,000 mg by mouth daily.     atorvastatin (LIPITOR) 20 MG tablet Take 1 tablet (20 mg total) by mouth daily. 90 tablet 3   baclofen (LIORESAL) 10 MG tablet Take 10 mg by mouth 2 (two) times daily. 1/2 tab     Blood Glucose Monitoring Suppl (GLUCOCOM BLOOD GLUCOSE MONITOR) DEVI Accu -chek 1 each 0   celecoxib (CELEBREX) 100 MG capsule      cetirizine (ZYRTEC) 10 MG tablet Take 20 mg by mouth daily as needed.      Cholecalciferol (VITAMIN D PO) Take 1 tablet by mouth daily.     co-enzyme Q-10 50 MG capsule Take 50 mg by mouth daily.     Continuous Blood Gluc Receiver (DEXCOM G7 RECEIVER) DEVI 1 each by Does not apply route every 14 (fourteen) days. 1 each 3   Continuous Blood Gluc Sensor (DEXCOM G7 SENSOR) MISC 1 each by Does not apply route every 14 (fourteen) days. 12 each 1   doxycycline (VIBRA-TABS) 100 MG tablet Take 1 tablet (100 mg total) by mouth 2 (two) times daily. 14 tablet 0   DULoxetine (CYMBALTA) 20 MG capsule Take 1 capsule (20 mg total) by mouth in the morning, at noon, in the evening, and at bedtime. 120 capsule 3   empagliflozin (JARDIANCE) 25 MG TABS tablet Take 1 tablet (25 mg total) by mouth daily before breakfast. 30 tablet 3   fluticasone (FLONASE) 50 MCG/ACT nasal spray Place 1-2 sprays into both nostrils as needed for rhinitis.      furosemide (LASIX) 40 MG tablet Take 1 tablet (40 mg total) by mouth daily. 30 tablet 3   insulin aspart (NOVOLOG) 100 UNIT/ML injection Inject 10 Units into the skin 3 (three) times daily as needed for high blood sugar. Pt reports she is taking 2-8 units per sliding scale based on glucose     NOVOLOG FLEXPEN 100 UNIT/ML FlexPen Inject into the skin.     nystatin (MYCOSTATIN/NYSTOP) powder Apply 1 Application topically 3 (three) times daily. 60 g 2   Omega-3 Fatty Acids (OMEGA 3 PO) Take 3 capsules by mouth daily.     ondansetron (ZOFRAN-ODT) 4 MG disintegrating tablet Take 1 tablet (4 mg total) by mouth every 8 (eight) hours as needed for nausea  or vomiting. 20 tablet 0  Oxycodone HCl 10 MG TABS Take 10 mg by mouth as needed.     pantoprazole (PROTONIX) 40 MG tablet TAKE 1 TABLET BY MOUTH EVERY DAY 90 tablet 1   polyethylene glycol (MIRALAX MIX-IN PAX) 17 g packet Take 17 g by mouth daily as needed. 14 each 0   pregabalin (LYRICA) 300 MG capsule Take 1 capsule (300 mg total) by mouth 2 (two) times daily. 60 capsule 5   pregabalin (LYRICA) 300 MG capsule Take 1 capsule (300 mg total) by mouth 2 (two) times daily. 60 capsule 0   Semaglutide, 1 MG/DOSE, 4 MG/3ML SOPN Inject 1 mg as directed once a week. 3 mL 0   sucralfate (CARAFATE) 1 GM/10ML suspension Take 10 mLs (1 g total) by mouth 4 (four) times daily. 420 mL 1   vitamin B-12 (CYANOCOBALAMIN) 1000 MCG tablet Take 4,000 mcg by mouth daily.     bosutinib (BOSULIF) 100 MG tablet Take 4 tablets (400 mg total) by mouth daily with breakfast. Take with food. 120 tablet 3   No current facility-administered medications for this visit.    Review of Systems  Constitutional:  Positive for malaise/fatigue. Negative for chills, fever and weight loss.       She does not feel good.  HENT:  Negative for congestion, ear pain, hearing loss, nosebleeds, sinus pain, sore throat and tinnitus.   Eyes: Negative.  Negative for blurred vision, double vision and photophobia.  Respiratory:  Negative for cough, hemoptysis, sputum production, shortness of breath and wheezing.   Cardiovascular:  Negative for chest pain, palpitations and PND.       CHF.  Gastrointestinal: Negative.  Negative for blood in stool, diarrhea, nausea and vomiting.  Genitourinary:  Negative for dysuria, hematuria and urgency.       Stress incontinence.  Musculoskeletal:  Positive for back pain (rods in spine) and joint pain (knees, shoulders, ankles, wrists). Negative for myalgias.       Scoliosis.  Skin:  Negative for rash.  Neurological:  Positive for sensory change (numbness in feet on Lyrica). Negative for dizziness, tremors,  speech change, seizures, weakness and headaches. Focal weakness: issues with balance. Endo/Heme/Allergies: Negative.  Does not bruise/bleed easily.  Psychiatric/Behavioral:  Positive for depression and memory loss (poor short term memory). Negative for substance abuse and suicidal ideas.        Tearful because of health condition.  All other systems reviewed and are negative. Left axillary redness and tenderness    Vitals Blood pressure 98/67, pulse 84, temperature (!) 96.1 F (35.6 C), temperature source Tympanic, resp. rate 18, SpO2 97%.  Physical Exam Vitals and nursing note reviewed.  Constitutional:      General: She is not in acute distress.    Appearance: She is well-developed. She is obese. She is not diaphoretic.  HENT:     Head: Normocephalic and atraumatic.  Eyes:     General: No scleral icterus.    Conjunctiva/sclera: Conjunctivae normal.  Cardiovascular:     Rate and Rhythm: Normal rate and regular rhythm.  Pulmonary:     Effort: Pulmonary effort is normal. No respiratory distress.     Breath sounds: No wheezing.  Abdominal:     Palpations: Abdomen is soft.  Musculoskeletal:        General: No swelling. Normal range of motion.     Cervical back: Normal range of motion.  Skin:    General: Skin is warm.  Neurological:     Mental Status: She is alert and oriented  to person, place, and time.  Psychiatric:        Mood and Affect: Mood normal.    Left axillary painful and tenderness area with erythematous changes.,    Appointment on 05/12/2023  Component Date Value Ref Range Status   Sodium 05/12/2023 134 (L)  135 - 145 mmol/L Final   Potassium 05/12/2023 3.4 (L)  3.5 - 5.1 mmol/L Final   Chloride 05/12/2023 98  98 - 111 mmol/L Final   CO2 05/12/2023 25  22 - 32 mmol/L Final   Glucose, Bld 05/12/2023 137 (H)  70 - 99 mg/dL Final   Glucose reference range applies only to samples taken after fasting for at least 8 hours.   BUN 05/12/2023 9  8 - 23 mg/dL Final    Creatinine 65/78/4696 0.75  0.44 - 1.00 mg/dL Final   Calcium 29/52/8413 8.9  8.9 - 10.3 mg/dL Final   Total Protein 24/40/1027 6.4 (L)  6.5 - 8.1 g/dL Final   Albumin 25/36/6440 3.0 (L)  3.5 - 5.0 g/dL Final   AST 34/74/2595 19  15 - 41 U/L Final   ALT 05/12/2023 13  0 - 44 U/L Final   Alkaline Phosphatase 05/12/2023 63  38 - 126 U/L Final   Total Bilirubin 05/12/2023 0.6  0.0 - 1.2 mg/dL Final   GFR, Estimated 05/12/2023 >60  >60 mL/min Final   Comment: (NOTE) Calculated using the CKD-EPI Creatinine Equation (2021)    Anion gap 05/12/2023 11  5 - 15 Final   Performed at [facility], [address] [place], [place] [address]   WBC Count 05/12/2023 11.8 (H)  4.0 - 10.5 K/uL Final   RBC 05/12/2023 4.36  3.87 - 5.11 MIL/uL Final   Hemoglobin 05/12/2023 12.6  12.0 - 15.0 g/dL Final   HCT 64/33/2951 37.3  36.0 - 46.0 % Final   MCV 05/12/2023 85.6  80.0 - 100.0 fL Final   MCH 05/12/2023 28.9  26.0 - 34.0 pg Final   MCHC 05/12/2023 33.8  30.0 - 36.0 g/dL Final   RDW 88/41/6606 13.5  11.5 - 15.5 % Final   Platelet Count 05/12/2023 274  150 - 400 K/uL Final   nRBC 05/12/2023 0.0  0.0 - 0.2 % Final   Neutrophils Relative % 05/12/2023 84  % Final   Neutro Abs 05/12/2023 9.9 (H)  1.7 - 7.7 K/uL Final   Lymphocytes Relative 05/12/2023 7  % Final   Lymphs Abs 05/12/2023 0.8  0.7 - 4.0 K/uL Final   Monocytes Relative 05/12/2023 7  % Final   Monocytes Absolute 05/12/2023 0.8  0.1 - 1.0 K/uL Final   Eosinophils Relative 05/12/2023 1  % Final   Eosinophils Absolute 05/12/2023 0.1  0.0 - 0.5 K/uL Final   Basophils Relative 05/12/2023 0  % Final   Basophils Absolute 05/12/2023 0.1  0.0 - 0.1 K/uL Final   Immature Granulocytes 05/12/2023 1  % Final   Abs Immature Granulocytes 05/12/2023 0.14 (H)  0.00 - 0.07 K/uL Final   Performed at [facility], [address], [place], [place] [address]

## 2023-05-12 NOTE — Assessment & Plan Note (Signed)
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 02/20/2021, BCR ABL 1 FISH is negative.b3 a2 transcript 0.27%. 08/03/2021, BCR-ABL1 FISH is negative,b3 a2 transcript 0.0521%. 10/26/2021 BCR-ABL1 FISH is negative,b3 a2 transcript 0.0458% 01/31/2022 BCR-ABL1 FISH is negative,b3 a2 transcript 0.0251% 05/17/2022 BCR-ABL1 FISH is negative,b3 a2 transcript 0.0410% 09/06/2022 BCR-ABL1 FISH is negative,b3 a2 transcript negative  Today's blood work is pending.  Recommend patient to continue bosutinib,continue 400mg  daily  Refill prescription has been sent to pharmacy.

## 2023-05-12 NOTE — Assessment & Plan Note (Signed)
Follow-up with psychiatrist

## 2023-05-12 NOTE — Assessment & Plan Note (Signed)
 Given the size of skin infection and her immunocompromised state, recommend patient to go to emergency room for further evaluation.  She may need I&D She adamantly declines and prefers to trial a course of antibiotics. I recommend patient to take doxycycline 100 mg twice daily for 7 days.  I urged patient to make a follow-up appointment with her primary care provider for further evaluation.  If symptom gets worse recommend patient to consider go to emergency room

## 2023-05-12 NOTE — Assessment & Plan Note (Signed)
 Treatment plan as listed above.

## 2023-05-13 ENCOUNTER — Telehealth: Payer: Self-pay | Admitting: Pharmacy Technician

## 2023-05-13 ENCOUNTER — Other Ambulatory Visit: Payer: Self-pay

## 2023-05-13 ENCOUNTER — Other Ambulatory Visit: Payer: Self-pay | Admitting: Pharmacy Technician

## 2023-05-13 ENCOUNTER — Telehealth: Payer: Self-pay

## 2023-05-13 ENCOUNTER — Other Ambulatory Visit (HOSPITAL_COMMUNITY): Payer: Self-pay

## 2023-05-13 NOTE — Telephone Encounter (Signed)
 Note faxed to Dr. Prudencio Pair office.

## 2023-05-13 NOTE — Telephone Encounter (Signed)
-----   Message from Rickard Patience sent at 05/12/2023  9:01 PM EDT ----- Please fax a copy of my note to primary care provider's office.  Recommend PCP to make a follow-up appointment for further evaluation of the left axillary abscess.

## 2023-05-13 NOTE — Progress Notes (Signed)
 Specialty Pharmacy Initiation Note   Tara Levine is a 68 y.o. female who will be followed by the specialty pharmacy service for RxSp Oncology    Review of administration, indication, effectiveness, safety, potential side effects, storage/disposable, and missed dose instructions occurred today for patient's specialty medication(s) Bosutinib (BOSULIF)     Patient/Caregiver did not have any additional questions or concerns.   Patient's therapy is appropriate to: Initiate    Goals Addressed             This Visit's Progress    Achieve or maintain remission       Patient is initiating therapy. Patient will maintain adherence        Patient switching from PAP to filling at [facility] (Specialty). Patient no showed and rescheduled many office appts. Due to this MD wanted to wait until patient was seen in clinic to send in rx.    Remi Haggard Specialty Pharmacist

## 2023-05-13 NOTE — Telephone Encounter (Signed)
 Oral Oncology Patient Advocate Encounter  After completing a benefits investigation, prior authorization for Bonsulif is not required at this time through [facility] + Medicaid of [place].  Patient's copay is $0.     Patty Almedia Balls, CPhT Oncology Pharmacy Patient Advocate [facility] Direct Number: [phone] Fax: [phone]

## 2023-05-13 NOTE — Progress Notes (Signed)
 Specialty Pharmacy Initial Fill Coordination Note  Tara Levine is a 68 y.o. female contacted today regarding refills of specialty medication(s) Bosutinib (BOSULIF) .  Patient requested Delivery  on 05/15/23  to verified address [address] [place] [address]   Medication will be filled on 05/14/2022.   Patient is aware of $0 copayment.   Patty Almedia Balls, CPhT Oncology Pharmacy Patient Advocate [facility] Direct Number: [phone] Fax: [phone]

## 2023-05-14 ENCOUNTER — Other Ambulatory Visit: Payer: Self-pay

## 2023-05-15 LAB — BCR-ABL1 FISH
Cells Analyzed: 200
Cells Counted: 200

## 2023-05-17 LAB — BCR-ABL1, CML/ALL, PCR, QUANT
E1A2 Transcript: <0.0032 %
b2a2 transcript: <0.0032 %
b3a2 transcript: 0.0237 %

## 2023-05-21 ENCOUNTER — Telehealth: Payer: Medicare HMO | Admitting: Oncology

## 2023-05-26 ENCOUNTER — Telehealth: Payer: Self-pay

## 2023-05-26 DIAGNOSIS — C9211 Chronic myeloid leukemia, BCR/ABL-positive, in remission: Secondary | ICD-10-CM | POA: Diagnosis not present

## 2023-05-26 DIAGNOSIS — R509 Fever, unspecified: Secondary | ICD-10-CM | POA: Diagnosis not present

## 2023-05-26 DIAGNOSIS — L02412 Cutaneous abscess of left axilla: Secondary | ICD-10-CM | POA: Diagnosis not present

## 2023-05-26 NOTE — Telephone Encounter (Signed)
 received fax that "name brand cymbalta is not covered by [employer], please send new script for [id number] Lupin brand duloxetine 20mg  along with reason for lupin brand and /or symptoms when using other brands."   Pt last seen on 1-16 next appt 4-9

## 2023-05-26 NOTE — Telephone Encounter (Signed)
 went online to [email or web address] and submitted a prior auth for the name brand cymbalta. -pendin

## 2023-05-27 DIAGNOSIS — L02412 Cutaneous abscess of left axilla: Secondary | ICD-10-CM | POA: Diagnosis not present

## 2023-05-29 NOTE — Telephone Encounter (Signed)
 prior Tara Levine was denied for name brand. - please review form that is in your basket.

## 2023-05-29 NOTE — Telephone Encounter (Signed)
 Could you update her with this information and ask if she would be willing to try the generic version? thanks
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
## 2023-06-02 NOTE — Telephone Encounter (Signed)
 this is 2nd message left will try again tomorrow and if no answer message will be closed.

## 2023-06-04 ENCOUNTER — Encounter: Payer: Self-pay | Admitting: Psychiatry

## 2023-06-04 ENCOUNTER — Telehealth: Payer: Medicare HMO | Admitting: Psychiatry

## 2023-06-04 DIAGNOSIS — F419 Anxiety disorder, unspecified: Secondary | ICD-10-CM

## 2023-06-04 DIAGNOSIS — F1320 Sedative, hypnotic or anxiolytic dependence, uncomplicated: Secondary | ICD-10-CM | POA: Diagnosis not present

## 2023-06-04 DIAGNOSIS — F410 Panic disorder [episodic paroxysmal anxiety] without agoraphobia: Secondary | ICD-10-CM

## 2023-06-04 DIAGNOSIS — F331 Major depressive disorder, recurrent, moderate: Secondary | ICD-10-CM | POA: Diagnosis not present

## 2023-06-04 DIAGNOSIS — Z79899 Other long term (current) drug therapy: Secondary | ICD-10-CM

## 2023-06-04 MED ORDER — ALPRAZOLAM 0.5 MG PO TABS: 0.5000 mg | ORAL_TABLET | Freq: Three times a day (TID) | ORAL | 0 refills | Status: AC | PRN

## 2023-06-05 ENCOUNTER — Other Ambulatory Visit: Payer: Self-pay

## 2023-06-05 ENCOUNTER — Other Ambulatory Visit (HOSPITAL_COMMUNITY): Payer: Self-pay

## 2023-06-05 NOTE — Progress Notes (Signed)
 Specialty Pharmacy Refill Coordination Note  Tara Levine is a 68 y.o. female contacted today regarding refills of specialty medication(s) Bosutinib (BOSULIF)   Patient requested Delivery   Delivery date: 06/10/23   Verified address: [address] [place] [address]   Medication will be filled on 04.14.25.

## 2023-06-05 NOTE — Progress Notes (Signed)
 Specialty Pharmacy Ongoing Clinical Assessment Note  Tara Levine is a 68 y.o. female who is being followed by the specialty pharmacy service for RxSp Oncology   Patient's specialty medication(s) reviewed today: Bosutinib (BOSULIF)   Missed doses in the last 4 weeks: 0   Patient/Caregiver did not have any additional questions or concerns.   Therapeutic benefit summary: Patient is achieving benefit   Adverse events/side effects summary: No adverse events/side effects   Patient's therapy is appropriate to: Continue    Goals Addressed             This Visit's Progress    Achieve or maintain remission       Patient is on track. Patient will maintain adherence          Follow up:  6 months  Otto Herb Specialty Pharmacist

## 2023-06-05 NOTE — Telephone Encounter (Signed)
 faxed and confirmed approval notice for the cymbalta

## 2023-06-05 NOTE — Telephone Encounter (Signed)
 appeal from [facility] for the cymbalta has been approved from 02-26-23 to 02-25-24

## 2023-06-06 DIAGNOSIS — M5412 Radiculopathy, cervical region: Secondary | ICD-10-CM | POA: Diagnosis not present

## 2023-06-06 DIAGNOSIS — M7918 Myalgia, other site: Secondary | ICD-10-CM | POA: Diagnosis not present

## 2023-06-06 DIAGNOSIS — M545 Low back pain, unspecified: Secondary | ICD-10-CM | POA: Diagnosis not present

## 2023-06-06 DIAGNOSIS — G8929 Other chronic pain: Secondary | ICD-10-CM | POA: Diagnosis not present

## 2023-06-09 ENCOUNTER — Other Ambulatory Visit: Payer: Self-pay | Admitting: Internal Medicine

## 2023-06-09 DIAGNOSIS — L02419 Cutaneous abscess of limb, unspecified: Secondary | ICD-10-CM

## 2023-06-13 ENCOUNTER — Ambulatory Visit: Attending: Internal Medicine

## 2023-06-13 ENCOUNTER — Encounter

## 2023-07-02 ENCOUNTER — Other Ambulatory Visit: Payer: Self-pay

## 2023-07-04 ENCOUNTER — Other Ambulatory Visit (HOSPITAL_COMMUNITY): Payer: Self-pay

## 2023-07-04 ENCOUNTER — Other Ambulatory Visit: Payer: Self-pay

## 2023-07-04 NOTE — Progress Notes (Signed)
 Specialty Pharmacy Refill Coordination Note  Tara Levine is a 68 y.o. female contacted today regarding refills of specialty medication(s) Bosutinib (BOSULIF )   Patient requested Delivery   Delivery date: 07/08/23   Verified address: [address] [place] [address]   Medication will be filled on 07/07/23.

## 2023-07-07 ENCOUNTER — Other Ambulatory Visit: Payer: Self-pay
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
## 2023-07-30 ENCOUNTER — Encounter: Payer: Self-pay | Admitting: Psychiatry

## 2023-07-30 ENCOUNTER — Telehealth: Payer: Self-pay

## 2023-07-30 ENCOUNTER — Telehealth (INDEPENDENT_AMBULATORY_CARE_PROVIDER_SITE_OTHER): Admitting: Psychiatry

## 2023-07-30 DIAGNOSIS — F410 Panic disorder [episodic paroxysmal anxiety] without agoraphobia: Secondary | ICD-10-CM | POA: Diagnosis not present

## 2023-07-30 DIAGNOSIS — F1320 Sedative, hypnotic or anxiolytic dependence, uncomplicated: Secondary | ICD-10-CM

## 2023-07-30 DIAGNOSIS — F331 Major depressive disorder, recurrent, moderate: Secondary | ICD-10-CM | POA: Diagnosis not present

## 2023-07-30 DIAGNOSIS — F419 Anxiety disorder, unspecified: Secondary | ICD-10-CM | POA: Diagnosis not present

## 2023-07-30 MED ORDER — BREXPIPRAZOLE 0.25 MG PO TABS: 0.2500 mg | ORAL_TABLET | Freq: Every day | ORAL | 1 refills | Status: AC

## 2023-07-30 NOTE — Patient Instructions (Signed)
 Continue duloxetine  20 mg four times a day  Start rexulti 0.25 mg at night  Continue xanax  0.5 mg three times a day as needed for anxiety  Referred for evaluation of insomnia Referral for therapy Obtain urine drug screening.  Next appointment: 67/9 at 11 am

## 2023-07-30 NOTE — Telephone Encounter (Signed)
 prior Tara Levine was approved pa case # [id number] from 02-26-23 to 02-25-24

## 2023-07-30 NOTE — Telephone Encounter (Signed)
 on [email or web address] was a request for a prior auth for the rexulti. prior auth was submitted and is pending.

## 2023-08-01 ENCOUNTER — Inpatient Hospital Stay: Attending: Oncology

## 2023-08-01 ENCOUNTER — Other Ambulatory Visit: Payer: Self-pay

## 2023-08-01 DIAGNOSIS — Z79891 Long term (current) use of opiate analgesic: Secondary | ICD-10-CM | POA: Diagnosis not present

## 2023-08-01 DIAGNOSIS — C9212 Chronic myeloid leukemia, BCR/ABL-positive, in relapse: Secondary | ICD-10-CM | POA: Insufficient documentation

## 2023-08-01 DIAGNOSIS — Z79899 Other long term (current) drug therapy: Secondary | ICD-10-CM | POA: Diagnosis not present

## 2023-08-01 DIAGNOSIS — R5383 Other fatigue: Secondary | ICD-10-CM | POA: Insufficient documentation

## 2023-08-01 DIAGNOSIS — R531 Weakness: Secondary | ICD-10-CM | POA: Diagnosis not present

## 2023-08-01 DIAGNOSIS — F419 Anxiety disorder, unspecified: Secondary | ICD-10-CM | POA: Insufficient documentation

## 2023-08-01 DIAGNOSIS — M545 Low back pain, unspecified: Secondary | ICD-10-CM | POA: Diagnosis not present

## 2023-08-01 DIAGNOSIS — C9210 Chronic myeloid leukemia, BCR/ABL-positive, not having achieved remission: Secondary | ICD-10-CM

## 2023-08-01 DIAGNOSIS — M7918 Myalgia, other site: Secondary | ICD-10-CM | POA: Diagnosis not present

## 2023-08-01 DIAGNOSIS — R3912 Poor urinary stream: Secondary | ICD-10-CM | POA: Insufficient documentation

## 2023-08-01 DIAGNOSIS — F32A Depression, unspecified: Secondary | ICD-10-CM | POA: Insufficient documentation

## 2023-08-01 DIAGNOSIS — M5412 Radiculopathy, cervical region: Secondary | ICD-10-CM | POA: Diagnosis not present

## 2023-08-01 DIAGNOSIS — G8929 Other chronic pain: Secondary | ICD-10-CM | POA: Diagnosis not present

## 2023-08-01 DIAGNOSIS — N179 Acute kidney failure, unspecified: Secondary | ICD-10-CM | POA: Insufficient documentation

## 2023-08-01 LAB — CMP (CANCER CENTER ONLY)
ALT: 17 U/L (ref 0–44)
AST: 27 U/L (ref 15–41)
Albumin: 3.8 g/dL (ref 3.5–5.0)
Alkaline Phosphatase: 63 U/L (ref 38–126)
Anion gap: 8 (ref 5–15)
BUN: 16 mg/dL (ref 8–23)
CO2: 26 mmol/L (ref 22–32)
Calcium: 8.5 mg/dL — ABNORMAL LOW (ref 8.9–10.3)
Chloride: 100 mmol/L (ref 98–111)
Creatinine: 1.43 mg/dL — ABNORMAL HIGH (ref 0.44–1.00)
GFR, Estimated: 40 mL/min — ABNORMAL LOW (ref >60)
Glucose, Bld: 81 mg/dL (ref 70–99)
Potassium: 3.3 mmol/L — ABNORMAL LOW (ref 3.5–5.1)
Sodium: 134 mmol/L — ABNORMAL LOW (ref 135–145)
Total Bilirubin: 0.6 mg/dL (ref 0.0–1.2)
Total Protein: 6.9 g/dL (ref 6.5–8.1)

## 2023-08-01 LAB — CBC WITH DIFFERENTIAL (CANCER CENTER ONLY)
Abs Immature Granulocytes: 0.01 10*3/uL (ref 0.00–0.07)
Basophils Absolute: 0 10*3/uL (ref 0.0–0.1)
Basophils Relative: 0 %
Eosinophils Absolute: 0.1 10*3/uL (ref 0.0–0.5)
Eosinophils Relative: 2 %
HCT: 38.3 % (ref 36.0–46.0)
Hemoglobin: 12.5 g/dL (ref 12.0–15.0)
Immature Granulocytes: 0 %
Lymphocytes Relative: 20 %
Lymphs Abs: 1.3 10*3/uL (ref 0.7–4.0)
MCH: 28.5 pg (ref 26.0–34.0)
MCHC: 32.6 g/dL (ref 30.0–36.0)
MCV: 87.2 fL (ref 80.0–100.0)
Monocytes Absolute: 0.6 10*3/uL (ref 0.1–1.0)
Monocytes Relative: 10 %
Neutro Abs: 4.4 10*3/uL (ref 1.7–7.7)
Neutrophils Relative %: 68 %
Platelet Count: 132 10*3/uL — ABNORMAL LOW (ref 150–400)
RBC: 4.39 MIL/uL (ref 3.87–5.11)
RDW: 13.8 % (ref 11.5–15.5)
WBC Count: 6.4 10*3/uL (ref 4.0–10.5)
nRBC: 0 % (ref 0.0–0.2)

## 2023-08-04 ENCOUNTER — Telehealth: Payer: Self-pay | Admitting: Oncology

## 2023-08-04 ENCOUNTER — Telehealth: Payer: Self-pay | Admitting: *Deleted

## 2023-08-04 ENCOUNTER — Other Ambulatory Visit: Payer: Self-pay

## 2023-08-04 NOTE — Telephone Encounter (Signed)
 The patient called again and go over the labs plt , then B12 and kidney numbers. Nellie Banas called and left message about this and all I did try again to get in touch with her . I did say again that Dr Wilhelmenia Harada suggested to to to ER with kidney numbers going up had to leave a voicemail because pt. Did not pick up the call

## 2023-08-04 NOTE — Telephone Encounter (Signed)
 Patient says that she has looked online about all of her labs especially her platelets being low the B12 was being high and kidney function.  She started to cry on the message that she sent because other people in his family has died and she wants to make sure that someone is going to call her and tell her what to do about these labs numbers.

## 2023-08-04 NOTE — Telephone Encounter (Signed)
 Pt is scheduled for Mychart visit on 6/20. Some of her lab work is still not back. Please advise.

## 2023-08-04 NOTE — Telephone Encounter (Signed)
 She got her blood work done and the results are back. She is worried about the results. She is asking if she can get a mychart visit.   Tara Levine is very scared and crying. She said she is sleeping all the time and just has no energy. She can't get out of bed and do anything not even a shower. She said due to not being able to get up, she needs a mychart visit. Please advise scheduling   [phone] is the best number to reach her

## 2023-08-04 NOTE — Progress Notes (Signed)
 Specialty Pharmacy Refill Coordination Note  Tara Levine is a 68 y.o. female contacted today regarding refills of specialty medication(s) Bosutinib (BOSULIF )   Patient requested Delivery   Delivery date: 08/06/23   Verified address: [address]   [place] [address]   Medication will be filled on 08/05/23.

## 2023-08-04 NOTE — Telephone Encounter (Signed)
 Called pt back x2, no answer. Detailed VM left letting her know that Dr. Wilhelmenia Harada has reviewed recent labs and plaletes are on the low end but it is fine, not dangerously low. Last B12 level is from March but it is just slight above normal range, no need to be concerned. Kidney function is worse so Dr. Wilhelmenia Harada highly recommeds for pt to go to ER for furthe evaluation.   CML labs are still pending. Advised pt to keep Mychart appt on 6/20.

## 2023-08-05 ENCOUNTER — Other Ambulatory Visit: Payer: Self-pay

## 2023-08-06 LAB — BCR-ABL1 FISH
Cells Analyzed: 200
Cells Counted: 200

## 2023-08-13 ENCOUNTER — Other Ambulatory Visit: Payer: Self-pay

## 2023-08-13 NOTE — Progress Notes (Signed)
 Clinical Intervention Note  Clinical Intervention Notes: Patient reports initiating Rexulti . No DDIs identified with Bosulif .   Clinical Intervention Outcomes: Prevention of an adverse drug event   Rena Carnes Specialty Pharmacist

## 2023-08-15 ENCOUNTER — Inpatient Hospital Stay: Admitting: Oncology

## 2023-08-15 ENCOUNTER — Other Ambulatory Visit

## 2023-08-15 ENCOUNTER — Encounter: Payer: Self-pay | Admitting: Oncology

## 2023-08-15 DIAGNOSIS — C9210 Chronic myeloid leukemia, BCR/ABL-positive, not having achieved remission: Secondary | ICD-10-CM | POA: Diagnosis not present

## 2023-08-15 DIAGNOSIS — F32A Depression, unspecified: Secondary | ICD-10-CM

## 2023-08-15 DIAGNOSIS — F419 Anxiety disorder, unspecified: Secondary | ICD-10-CM | POA: Diagnosis not present

## 2023-08-15 DIAGNOSIS — N179 Acute kidney failure, unspecified: Secondary | ICD-10-CM

## 2023-08-15 NOTE — Progress Notes (Signed)
 HEMATOLOGY-ONCOLOGY TeleHEALTH VISIT PROGRESS NOTE  I connected with Tara Levine on 08/15/23  at 10:30 AM EDT by video enabled telemedicine visit and verified that I am speaking with the correct person using two identifiers. I discussed the limitations, risks, security and privacy concerns of performing an evaluation and management service by telemedicine and the availability of in-person appointments. The patient expressed understanding and agreed to proceed.   Other persons participating in the visit and their role in the encounter:  None  Patient's location: Home  Provider's location: office Chief Complaint: CML   INTERVAL HISTORY Tara Levine is a 69 y.o. female who has above history reviewed by me today presents virtually for follow up visit for management of CML Patients requests virtual visits due to difficulty in ambulating and not able to come for in person follow up I discussed the limitations of evaluation and management by telemedicine. The patient expressed understanding and agreed to proceed.  Patient reports that since she started off Farxiga, she has experienced a weak urinary stream and increased weakness.  08/01/2023, patient had a blood work done which showed acute decrease of kidney function compared to her baseline.  Our office has contacted patient recommend patient to go to ER for further evaluation.  Patient did not follow.  Today, patient informs me that she has felt better since stop the Farxiga and bosutinib 400 mg daily.    Review of Systems  Constitutional:  Positive for fatigue. Negative for chills and fever.  HENT:   Negative for hearing loss and voice change.   Eyes:  Negative for eye problems.  Respiratory:  Negative for chest tightness, cough and shortness of breath.   Cardiovascular:  Negative for chest pain.  Gastrointestinal:  Negative for abdominal distention, abdominal pain, blood in stool and nausea.  Endocrine: Negative for hot flashes.   Genitourinary:  Negative for difficulty urinating, frequency and hematuria.        Weak urine stream  Musculoskeletal:  Positive for arthralgias. Negative for flank pain.  Skin:  Negative for itching and rash.  Neurological:  Negative for extremity weakness.  Hematological:  Negative for adenopathy.  Psychiatric/Behavioral:  Negative for confusion. The patient is not nervous/anxious.     Past Medical History:  Diagnosis Date   CML (chronic myelocytic leukemia) (HCC)    Depression    DM type 2 (diabetes mellitus, type 2) (HCC)    Environmental allergies    History of gastric ulcer    HLD (hyperlipidemia)    IDA (iron deficiency anemia)    chronic   Leukemia (HCC)    Leukemia, chronic myeloid (HCC)    Osteoarthritis    Panic disorder    PSS (progressive systemic sclerosis) (HCC)    Unspecified essential hypertension    Past Surgical History:  Procedure Laterality Date   back and neck surgery  1999, 2009   had rods placed   CARDIAC CATHETERIZATION  1990   [place]  [facility]) No blackages found.    CARPAL TUNNEL RELEASE     PARTIAL HYSTERECTOMY     age 23, no cancer    Family History  Problem Relation Age of Onset   Diabetes Mother    Hypertension Mother    Heart disease Mother    Prostate cancer Father    Heart disease Father    Diabetes Brother     Social History   Socioeconomic History   Marital status: Married    Spouse name: Not on file   Number of  children: Not on file   Years of education: Not on file   Highest education level: Not on file  Occupational History   Not on file  Tobacco Use   Smoking status: Never   Smokeless tobacco: Never   Tobacco comments:    quit 1985  Vaping Use   Vaping status: Never Used  Substance and Sexual Activity   Alcohol  use: No    Alcohol /week: 0.0 standard drinks of alcohol    Drug use: No   Sexual activity: Not Currently  Other Topics Concern   Not on file  Social History Narrative   Not on file    Social Drivers of Health   Financial Resource Strain: Low Risk  (05/26/2023)   Received from [facility]   Overall Financial Resource Strain (CARDIA)    Difficulty of Paying Living Expenses: Not hard at all  Food Insecurity: No Food Insecurity (05/26/2023)   Received from [facility]   Hunger Vital Sign    Within the past 12 months, you worried that your food would run out before you got the money to buy more.: Never true    Within the past 12 months, the food you bought just didn't last and you didn't have money to get more.: Never true  Transportation Needs: No Transportation Needs (05/26/2023)   Received from [facility] - Transportation    In the past 12 months, has lack of transportation kept you from medical appointments or from getting medications?: No    Lack of Transportation (Non-Medical): No  Physical Activity: Not on file  Stress: Not on file  Social Connections: Not on file  Intimate Partner Violence: Not on file    Current Outpatient Medications on File Prior to Visit  Medication Sig Dispense Refill   ACCU-CHEK GUIDE test strip TEST DAILY AND AS DIRECTED. 300 strip 10   Accu-Chek Softclix Lancets lancets TEST BLOOD SUGAR THREE TIMES DAILY AS INSTRUCTED 300 each 10   albuterol  (PROVENTIL  HFA;VENTOLIN  HFA) 108 (90 BASE) MCG/ACT inhaler Inhale 2 puffs into the lungs every 6 (six) hours as needed for wheezing or shortness of breath.     albuterol  (PROVENTIL ) (2.5 MG/3ML) 0.083% nebulizer solution INHALE CONTENTS OF 1 VIAL VIA NEBULIZER THREE TIMES A DAY prn     Alcohol  Swabs  (DROPSAFE ALCOHOL  PREP) 70 % PADS USE THREE TIMES DAILY  TO CHECK BLOOD SUGAR 300 each 10   amLODipine  (NORVASC ) 5 MG tablet Take 1 tablet (5 mg total) by mouth daily. 90 tablet 0   Ascorbic Acid  (VITAMIN C) 1000 MG tablet Take 3,000 mg by mouth daily.     atorvastatin  (LIPITOR) 20 MG tablet Take 1 tablet (20 mg total) by mouth daily. 90  tablet 3   baclofen  (LIORESAL ) 10 MG tablet Take 10 mg by mouth 2 (two) times daily. 1/2 tab     Blood Glucose Monitoring Suppl (GLUCOCOM BLOOD GLUCOSE MONITOR) DEVI Accu -chek 1 each 0   celecoxib  (CELEBREX ) 100 MG capsule      cetirizine (ZYRTEC) 10 MG tablet Take 20 mg by mouth daily as needed.      Cholecalciferol  (VITAMIN D  PO) Take 1 tablet by mouth daily.     co-enzyme Q-10 50 MG capsule Take 50 mg by mouth daily.     Continuous Blood Gluc Receiver (DEXCOM G7 RECEIVER) DEVI 1 each by Does not apply route every 14 (fourteen) days. 1 each 3   Continuous Blood Gluc Sensor (DEXCOM G7 SENSOR)  MISC 1 each by Does not apply route every 14 (fourteen) days. 12 each 1   doxycycline  (VIBRA -TABS) 100 MG tablet Take 1 tablet (100 mg total) by mouth 2 (two) times daily. 14 tablet 0   DULoxetine  (CYMBALTA ) 20 MG capsule Take 1 capsule (20 mg total) by mouth in the morning, at noon, in the evening, and at bedtime. 120 capsule 3   empagliflozin  (JARDIANCE ) 25 MG TABS tablet Take 1 tablet (25 mg total) by mouth daily before breakfast. 30 tablet 3   fluticasone  (FLONASE ) 50 MCG/ACT nasal spray Place 1-2 sprays into both nostrils as needed for rhinitis.      furosemide  (LASIX ) 40 MG tablet Take 1 tablet (40 mg total) by mouth daily. 30 tablet 3   insulin  aspart (NOVOLOG ) 100 UNIT/ML injection Inject 10 Units into the skin 3 (three) times daily as needed for high blood sugar. Pt reports she is taking 2-8 units per sliding scale based on glucose     NOVOLOG  FLEXPEN 100 UNIT/ML FlexPen Inject into the skin.     nystatin  (MYCOSTATIN /NYSTOP ) powder Apply 1 Application topically 3 (three) times daily. 60 g 2   Omega-3 Fatty Acids (OMEGA 3 PO) Take 3 capsules by mouth daily.     ondansetron  (ZOFRAN -ODT) 4 MG disintegrating tablet Take 1 tablet (4 mg total) by mouth every 8 (eight) hours as needed for nausea or vomiting. 20 tablet 0   Oxycodone  HCl 10 MG TABS Take 10 mg by mouth as needed.     pantoprazole  (PROTONIX )  40 MG tablet TAKE 1 TABLET BY MOUTH EVERY DAY 90 tablet 1   polyethylene glycol (MIRALAX  MIX-IN PAX) 17 g packet Take 17 g by mouth daily as needed. 14 each 0   pregabalin  (LYRICA ) 300 MG capsule Take 1 capsule (300 mg total) by mouth 2 (two) times daily. 60 capsule 5   pregabalin  (LYRICA ) 300 MG capsule Take 1 capsule (300 mg total) by mouth 2 (two) times daily. 60 capsule 0   Semaglutide , 1 MG/DOSE, 4 MG/3ML SOPN Inject 1 mg as directed once a week. 3 mL 0   sucralfate  (CARAFATE ) 1 GM/10ML suspension Take 10 mLs (1 g total) by mouth 4 (four) times daily. 420 mL 1   vitamin B-12 (CYANOCOBALAMIN ) 1000 MCG tablet Take 4,000 mcg by mouth daily.     bosutinib (BOSULIF ) 100 MG tablet Take 4 tablets (400 mg total) by mouth daily with breakfast. Take with food. (Patient not taking: Reported on 08/15/2023) 120 tablet 3   brexpiprazole  (REXULTI ) 0.25 MG TABS tablet Take 1 tablet (0.25 mg total) by mouth daily. (Patient not taking: Reported on 08/15/2023) 30 tablet 1   No current facility-administered medications on file prior to visit.    Allergies  Allergen Reactions   Morphine  Rash    Headache too   Codeine  Other (See Comments)    Reaction: Unknown    Latex Other (See Comments)    Reaction:  Unknown    Morphine  And Codeine  Other (See Comments)    Reaction:  Unknown    Relafen [Nabumetone] Hives   Nsaids Rash   Tape Other (See Comments) and Rash    Reaction:  Unknown        Observations/Objective: There were no vitals filed for this visit.   There is no height or weight on file to calculate BMI.  Physical Exam  Neurological:     Mental Status: She is alert.     LABS    Latest Ref Rng & Units 08/01/2023  1:07 PM 05/12/2023    3:00 PM 11/02/2022    3:02 PM  CBC  WBC 4.0 - 10.5 K/uL 6.4  11.8  7.8   Hemoglobin 12.0 - 15.0 g/dL 16.1  09.6  04.5   Hematocrit 36.0 - 46.0 % 38.3  37.3  44.0   Platelets 150 - 400 K/uL 132  274  228       Latest Ref Rng & Units 08/01/2023    1:06 PM  05/12/2023    3:00 PM 11/02/2022    3:02 PM  CMP  Glucose 70 - 99 mg/dL 81  409  811   BUN 8 - 23 mg/dL 16  9  25    Creatinine 0.44 - 1.00 mg/dL 9.14  7.82  9.56   Sodium 135 - 145 mmol/L 134  134  134   Potassium 3.5 - 5.1 mmol/L 3.3  3.4  3.5   Chloride 98 - 111 mmol/L 100  98  93   CO2 22 - 32 mmol/L 26  25  27    Calcium  8.9 - 10.3 mg/dL 8.5  8.9  9.1   Total Protein 6.5 - 8.1 g/dL 6.9  6.4  7.6   Total Bilirubin 0.0 - 1.2 mg/dL 0.6  0.6  0.8   Alkaline Phos 38 - 126 U/L 63  63  64   AST 15 - 41 U/L 27  19  33   ALT 0 - 44 U/L 17  13  32    ASSESSMENT & PLAN:   CML (chronic myelocytic leukemia) (HCC) #CML, chronic phase, recurrent.-Restarted on bosutinib In molecular remission Hold bosutinib 400mg  daily due to acute change of renal function.   Anxiety and depression Follow-up with psychiatrist  Acute kidney failure [facility]) Encourage patient to increase oral hydration. Possible secondary to Farxiga which patient has self discontinued. Recommend patient to further discuss with primary care provider. Hold off bosutinib due to change of GFR.  Repeat BMP in 2 weeks   Orders Placed This Encounter  Procedures   CBC with Differential (Cancer Center Only)    Standing Status:   Future    Expected Date:   11/15/2023    Expiration Date:   02/13/2024   CMP (Cancer Center only)    Standing Status:   Future    Expected Date:   11/15/2023    Expiration Date:   02/13/2024   BCR-ABL1, CML/ALL, PCR, QUANT    Standing Status:   Future    Expected Date:   11/15/2023    Expiration Date:   02/13/2024   BCR-ABL1 FISH    Standing Status:   Future    Expected Date:   11/15/2023    Expiration Date:   02/13/2024   Basic Metabolic Panel - Cancer Center Only    Standing Status:   Future    Expected Date:   08/29/2023    Expiration Date:   11/27/2023   I discussed the assessment and treatment plan with the patient. The patient was provided an opportunity to ask questions and all were answered. She  has not been able to come to our office for in person visit due to multiple medical issues. She understands the limitation of virtual visits. The patient agreed with the plan and demonstrated an understanding of the instructions. The patient was advised to call back or seek an in-person evaluation if the symptoms worsen or if the condition fails to improve as anticipated.  All questions were answered.  Repeat blood work in 2 weeks.  Follow up in 3 months, patient understands the limitation of virtual visit and requests virtual visit due to transportation difficulty.    Timmy Forbes, MD, PhD [facility] Hematology Oncology 08/15/2023

## 2023-08-15 NOTE — Assessment & Plan Note (Signed)
 Encourage patient to increase oral hydration. Possible secondary to Farxiga which patient has self discontinued. Recommend patient to further discuss with primary care provider. Hold off bosutinib due to change of GFR.  Repeat BMP in 2 weeks

## 2023-08-15 NOTE — Assessment & Plan Note (Signed)
Follow-up with psychiatrist

## 2023-08-15 NOTE — Assessment & Plan Note (Signed)
#  CML, chronic phase, recurrent.-Restarted on bosutinib In molecular remission Hold bosutinib 400mg  daily due to acute change of renal function.

## 2023-08-25 ENCOUNTER — Telehealth: Payer: Self-pay

## 2023-08-25 NOTE — Telephone Encounter (Signed)
 Please advise her to discontinue Rexulti  if she experiences any side effects. I fully support if she wishes to change providers, and I will check whether another provider has availability to take over her care.

## 2023-08-25 NOTE — Telephone Encounter (Signed)
 Patient called stating that the brexpiprazole  (REXULTI ) 0.25 MG TABS tablet is causing her to have body pains she started the medication on 07-30-23 she took it for 5 days and has not taken it anymore since then she also stated that she would like to see someone else in the practice but not sure how to go about switching or if its possible please advise

## 2023-08-26 ENCOUNTER — Other Ambulatory Visit: Payer: Self-pay | Admitting: Oncology

## 2023-08-26 ENCOUNTER — Other Ambulatory Visit (HOSPITAL_COMMUNITY): Payer: Self-pay

## 2023-08-26 ENCOUNTER — Other Ambulatory Visit: Payer: Self-pay

## 2023-08-26 DIAGNOSIS — C9210 Chronic myeloid leukemia, BCR/ABL-positive, not having achieved remission: Secondary | ICD-10-CM

## 2023-08-26 NOTE — Progress Notes (Signed)
 Specialty Pharmacy Refill Coordination Note  Tara Levine is a 68 y.o. female contacted today regarding refills of specialty medication(s) Bosutinib (BOSULIF )   Patient requested Delivery   Delivery date: 08/27/23   Verified address: [address]   [place] [address]   Medication will be filled on 08/26/23. This fill date is pending response to refill request from provider. Patient is aware and if they have not received fill by intended date they must follow up with pharmacy.

## 2023-08-26 NOTE — Telephone Encounter (Signed)
 Spoke to patient advised her to discontinue the Rexulti  she voiced understanding

## 2023-08-28 ENCOUNTER — Other Ambulatory Visit: Payer: Self-pay

## 2023-08-28 ENCOUNTER — Inpatient Hospital Stay: Attending: Oncology

## 2023-08-28 ENCOUNTER — Telehealth: Payer: Self-pay

## 2023-08-28 DIAGNOSIS — C9210 Chronic myeloid leukemia, BCR/ABL-positive, not having achieved remission: Secondary | ICD-10-CM

## 2023-08-28 DIAGNOSIS — N179 Acute kidney failure, unspecified: Secondary | ICD-10-CM | POA: Insufficient documentation

## 2023-08-28 DIAGNOSIS — C9212 Chronic myeloid leukemia, BCR/ABL-positive, in relapse: Secondary | ICD-10-CM | POA: Diagnosis not present

## 2023-08-28 LAB — BASIC METABOLIC PANEL - CANCER CENTER ONLY
Anion gap: 6 (ref 5–15)
BUN: 10 mg/dL (ref 8–23)
CO2: 28 mmol/L (ref 22–32)
Calcium: 9.4 mg/dL (ref 8.9–10.3)
Chloride: 101 mmol/L (ref 98–111)
Creatinine: 1.09 mg/dL — ABNORMAL HIGH (ref 0.44–1.00)
GFR, Estimated: 55 mL/min — ABNORMAL LOW (ref >60)
Glucose, Bld: 98 mg/dL (ref 70–99)
Potassium: 4 mmol/L (ref 3.5–5.1)
Sodium: 135 mmol/L (ref 135–145)

## 2023-08-28 MED ORDER — BOSUTINIB 100 MG PO TABS
400.0000 mg | ORAL_TABLET | Freq: Every day | ORAL | 3 refills | Status: DC
  Filled 2023-08-28: qty 120, 30d supply, fill #0
  Filled 2023-09-23 – 2023-10-21 (×3): qty 120, 30d supply, fill #1
  Filled 2023-11-13 – 2023-11-18 (×2): qty 120, 30d supply, fill #2

## 2023-08-28 NOTE — Progress Notes (Signed)
 Left Voice Mail for patient Prescription refill was received today 08/28/23 and medication has to be order. (Message sent to [facility] as well) Ship 7/7 for 7/8.

## 2023-08-28 NOTE — Telephone Encounter (Signed)
 Pt informed that she may resume on Bosulif  at same dose and refill has been sent.   Per Dr.Yu, since pt will have in-person visit then we can r/s appts to lab/MD same day.    Please schedule lab/MD approx  9/19 and notify pt of new appt
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
## 2023-09-01 ENCOUNTER — Other Ambulatory Visit: Payer: Self-pay

## 2023-09-03 ENCOUNTER — Telehealth: Admitting: Psychiatry

## 2023-09-16 ENCOUNTER — Emergency Department

## 2023-09-16 ENCOUNTER — Encounter: Payer: Self-pay | Admitting: Emergency Medicine

## 2023-09-16 ENCOUNTER — Other Ambulatory Visit: Payer: Self-pay

## 2023-09-16 ENCOUNTER — Emergency Department
Admission: EM | Admit: 2023-09-16 | Discharge: 2023-09-16 | Disposition: A | Discharge status: Home / Self Care | Attending: Emergency Medicine | Admitting: Emergency Medicine

## 2023-09-16 DIAGNOSIS — J189 Pneumonia, unspecified organism: Secondary | ICD-10-CM

## 2023-09-16 DIAGNOSIS — N189 Chronic kidney disease, unspecified: Secondary | ICD-10-CM | POA: Insufficient documentation

## 2023-09-16 DIAGNOSIS — I509 Heart failure, unspecified: Secondary | ICD-10-CM | POA: Diagnosis not present

## 2023-09-16 DIAGNOSIS — I130 Hypertensive heart and chronic kidney disease with heart failure and stage 1 through stage 4 chronic kidney disease, or unspecified chronic kidney disease: Secondary | ICD-10-CM | POA: Diagnosis not present

## 2023-09-16 DIAGNOSIS — K5730 Diverticulosis of large intestine without perforation or abscess without bleeding: Secondary | ICD-10-CM | POA: Diagnosis not present

## 2023-09-16 DIAGNOSIS — J449 Chronic obstructive pulmonary disease, unspecified: Secondary | ICD-10-CM | POA: Diagnosis not present

## 2023-09-16 DIAGNOSIS — R109 Unspecified abdominal pain: Secondary | ICD-10-CM | POA: Diagnosis present

## 2023-09-16 DIAGNOSIS — J181 Lobar pneumonia, unspecified organism: Secondary | ICD-10-CM | POA: Diagnosis not present

## 2023-09-16 DIAGNOSIS — R1011 Right upper quadrant pain: Secondary | ICD-10-CM | POA: Diagnosis not present

## 2023-09-16 DIAGNOSIS — E1122 Type 2 diabetes mellitus with diabetic chronic kidney disease: Secondary | ICD-10-CM | POA: Insufficient documentation

## 2023-09-16 DIAGNOSIS — Z856 Personal history of leukemia: Secondary | ICD-10-CM | POA: Insufficient documentation

## 2023-09-16 DIAGNOSIS — R1084 Generalized abdominal pain: Secondary | ICD-10-CM | POA: Diagnosis not present

## 2023-09-16 DIAGNOSIS — K449 Diaphragmatic hernia without obstruction or gangrene: Secondary | ICD-10-CM | POA: Diagnosis not present

## 2023-09-16 LAB — COMPREHENSIVE METABOLIC PANEL WITH GFR
ALT: 11 U/L (ref 0–44)
AST: 18 U/L (ref 15–41)
Albumin: 3.5 g/dL (ref 3.5–5.0)
Alkaline Phosphatase: 81 U/L (ref 38–126)
Anion gap: 16 — ABNORMAL HIGH (ref 5–15)
BUN: 15 mg/dL (ref 8–23)
CO2: 23 mmol/L (ref 22–32)
Calcium: 9.6 mg/dL (ref 8.9–10.3)
Chloride: 100 mmol/L (ref 98–111)
Creatinine, Ser: 0.93 mg/dL (ref 0.44–1.00)
GFR, Estimated: >60 mL/min (ref >60)
Glucose, Bld: 134 mg/dL — ABNORMAL HIGH (ref 70–99)
Potassium: 3.7 mmol/L (ref 3.5–5.1)
Sodium: 139 mmol/L (ref 135–145)
Total Bilirubin: 1 mg/dL (ref 0.0–1.2)
Total Protein: 7.5 g/dL (ref 6.5–8.1)

## 2023-09-16 LAB — URINALYSIS, ROUTINE W REFLEX MICROSCOPIC
Bacteria, UA: NONE SEEN
Bilirubin Urine: NEGATIVE
Glucose, UA: NEGATIVE mg/dL
Hgb urine dipstick: NEGATIVE
Ketones, ur: 5 mg/dL — AB
Nitrite: NEGATIVE
Protein, ur: NEGATIVE mg/dL
Specific Gravity, Urine: 1.014 (ref 1.005–1.030)
pH: 7 (ref 5.0–8.0)

## 2023-09-16 LAB — CBC
HCT: 43.8 % (ref 36.0–46.0)
Hemoglobin: 14.3 g/dL (ref 12.0–15.0)
MCH: 27.3 pg (ref 26.0–34.0)
MCHC: 32.6 g/dL (ref 30.0–36.0)
MCV: 83.6 fL (ref 80.0–100.0)
Platelets: 298 K/uL (ref 150–400)
RBC: 5.24 MIL/uL — ABNORMAL HIGH (ref 3.87–5.11)
RDW: 13.3 % (ref 11.5–15.5)
WBC: 9.5 K/uL (ref 4.0–10.5)
nRBC: 0 % (ref 0.0–0.2)

## 2023-09-16 LAB — TROPONIN I (HIGH SENSITIVITY): Troponin I (High Sensitivity): 7 ng/L (ref <18)

## 2023-09-16 LAB — LIPASE, BLOOD: Lipase: 27 U/L (ref 11–51)

## 2023-09-16 MED ORDER — LACTATED RINGERS IV BOLUS
1000.0000 mL | Freq: Once | INTRAVENOUS | Status: AC
  Administered 2023-09-16: 1000 mL via INTRAVENOUS

## 2023-09-16 MED ORDER — AZITHROMYCIN 250 MG PO TABS: ORAL_TABLET | ORAL | 0 refills | Status: AC

## 2023-09-16 MED ORDER — CEFUROXIME AXETIL 500 MG PO TABS
500.0000 mg | ORAL_TABLET | Freq: Once | ORAL | Status: AC
  Administered 2023-09-16: 500 mg via ORAL
  Filled 2023-09-16: qty 1

## 2023-09-16 MED ORDER — ONDANSETRON HCL 4 MG/2ML IJ SOLN
4.0000 mg | Freq: Once | INTRAMUSCULAR | Status: AC
  Administered 2023-09-16: 4 mg via INTRAVENOUS
  Filled 2023-09-16: qty 2

## 2023-09-16 MED ORDER — CEFPODOXIME PROXETIL 200 MG PO TABS: 200.0000 mg | ORAL_TABLET | Freq: Two times a day (BID) | ORAL | 0 refills | Status: AC

## 2023-09-16 MED ORDER — IOHEXOL 300 MG/ML  SOLN
100.0000 mL | Freq: Once | INTRAMUSCULAR | Status: AC | PRN
  Administered 2023-09-16: 100 mL via INTRAVENOUS

## 2023-09-16 MED ORDER — HYDROMORPHONE HCL 1 MG/ML IJ SOLN
0.5000 mg | Freq: Once | INTRAMUSCULAR | Status: AC
  Administered 2023-09-16: 0.5 mg via INTRAVENOUS
  Filled 2023-09-16: qty 0.5

## 2023-09-16 NOTE — ED Notes (Signed)
 Patient transported to CT

## 2023-09-16 NOTE — ED Triage Notes (Signed)
 Pt to ED via [facility] from home for upper abdominal pain. Pt states that pain is worse in the RUQ. Pt states that she has been having pain for the last 3 days. Pt is in NAD.

## 2023-09-16 NOTE — ED Notes (Signed)
 First Nurse Note: Pt to ED via [facility] from home for RUQ pain x 3 days. Pt also having pain with urination.   18 G LAC CBG 116

## 2023-09-16 NOTE — ED Notes (Signed)
Fall bundle in place

## 2023-09-16 NOTE — ED Notes (Signed)
 Reviewed D/C information with the patient, pt verbalized understanding. No additional concerns at this time.

## 2023-09-16 NOTE — ED Provider Notes (Signed)
 [facility] Provider Note    Event Date/Time   First MD Initiated Contact with Patient 09/16/23 1812     (approximate)   History   Chief Complaint Abdominal Pain   HPI  Tara Levine is a 68 y.o. female with past medical history of hypertension, diabetes, CHF, COPD, CKD, and CML who presents to the ED complaining of abdominal pain.  Patient reports that she has had 2 weeks of increasing pain underneath her right rib cage.  She describes the pain as sharp, not exacerbated or alleviated by anything.  She has not had any associated nausea or vomiting, denies any changes in her bowel movements.  She denies any fevers, cough, chest pain, or difficulty breathing.  She does state that she had a fall a few days before the onset of symptoms, is concerned that she may have injured something in her back, where she has previous hardware.  She does endorse some dysuria and chills, denies any fevers or flank pain.     Physical Exam   Triage Vital Signs: ED Triage Vitals  Encounter Vitals Group     BP 09/16/23 1335 98/70     Girls Systolic BP Percentile --      Girls Diastolic BP Percentile --      Boys Systolic BP Percentile --      Boys Diastolic BP Percentile --      Pulse Rate 09/16/23 1335 (!) 101     Resp 09/16/23 1335 16     Temp 09/16/23 1345 97.9 F (36.6 C)     Temp Source 09/16/23 1345 Oral     SpO2 09/16/23 1335 96 %     Weight 09/16/23 1336 200 lb (90.7 kg)     Height 09/16/23 1336 5' 7 (1.702 m)     Head Circumference --      Peak Flow --      Pain Score 09/16/23 1336 7     Pain Loc --      Pain Education --      Exclude from Growth Chart --     Most recent vital signs: Vitals:   09/16/23 1802 09/16/23 1900  BP: 127/82   Pulse: 80 73  Resp: 18 13  Temp: 98.2 F (36.8 C)   SpO2: 97% 96%    Constitutional: Alert and oriented. Eyes: Conjunctivae are normal. Head: Atraumatic. Nose: No congestion/rhinnorhea. Mouth/Throat: Mucous  membranes are moist.  Cardiovascular: Normal rate, regular rhythm. Grossly normal heart sounds.  2+ radial pulses bilaterally. Respiratory: Normal respiratory effort.  No retractions. Lungs CTAB. Gastrointestinal: Soft and diffusely tender to palpation, greatest in the right upper quadrant.  No CVA tenderness bilaterally. No distention. Musculoskeletal: No lower extremity tenderness nor edema.  Neurologic:  Normal speech and language. No gross focal neurologic deficits are appreciated.    ED Results / Procedures / Treatments   Labs (all labs ordered are listed, but only abnormal results are displayed) Labs Reviewed  COMPREHENSIVE METABOLIC PANEL WITH GFR - Abnormal; Notable for the following components:      Result Value   Glucose, Bld 134 (*)    Anion gap 16 (*)    All other components within normal limits  CBC - Abnormal; Notable for the following components:   RBC 5.24 (*)    All other components within normal limits  URINALYSIS, ROUTINE W REFLEX MICROSCOPIC - Abnormal; Notable for the following components:   Color, Urine YELLOW (*)    APPearance CLEAR (*)  Ketones, ur 5 (*)    Leukocytes,Ua TRACE (*)    All other components within normal limits  LIPASE, BLOOD  TROPONIN I (HIGH SENSITIVITY)     EKG  ED ECG REPORT I, Carlin Palin, the attending physician, personally viewed and interpreted this ECG.   Date: 09/16/2023  EKG Time: 13:42  Rate: 100  Rhythm: normal sinus rhythm  Axis: LAD  Intervals:left anterior fascicular block  ST&T Change: None  RADIOLOGY CT abdomen/pelvis reviewed and interpreted by me with no inflammatory changes in the bowel, focal fluid collections, or dilated bowel loops.  PROCEDURES:  Critical Care performed: No  Procedures   MEDICATIONS ORDERED IN ED: Medications  cefUROXime  (CEFTIN ) tablet 500 mg (has no administration in time range)  HYDROmorphone  (DILAUDID ) injection 0.5 mg (0.5 mg Intravenous Given 09/16/23 1854)  ondansetron   (ZOFRAN ) injection 4 mg (4 mg Intravenous Given 09/16/23 1856)  lactated ringers  bolus 1,000 mL (1,000 mLs Intravenous New Bag/Given 09/16/23 1856)  iohexol  (OMNIPAQUE ) 300 MG/ML solution 100 mL (100 mLs Intravenous Contrast Given 09/16/23 1904)     IMPRESSION / MDM / ASSESSMENT AND PLAN / ED COURSE  I reviewed the triage vital signs and the nursing notes.                              68 y.o. female with past medical history of hypertension, diabetes, CHF, CKD, COPD, and CML who presents to the ED complaining of increasing pain in her abdomen, especially the right upper quadrant, over the past 2 weeks.  Patient's presentation is most consistent with acute presentation with potential threat to life or bodily function.  Differential diagnosis includes, but is not limited to, cholecystitis, biliary colic, hepatitis, pancreatitis, UTI, kidney stone, appendicitis, bowel obstruction, musculoskeletal injury.  Patient nontoxic-appearing and in no acute distress, vital signs are unremarkable.  Her abdomen is soft with diffuse tenderness, greatest in the right upper quadrant.  We will further assess with CT imaging, treat symptomatically with IV Dilaudid  and Zofran  as patient reports allergy to morphine .  Labs without significant anemia, leukocytosis, electrolyte abnormality, or AKI.  LFTs and lipase are unremarkable, urinalysis pending at this time.  CT imaging is negative for intra-abdominal process, does show bilateral lower lobe pneumonia, which could be causing patient's upper abdominal discomfort.  Given no signs of sepsis or respiratory failure, patient appropriate for outpatient management with antibiotics.  She was given initial dose here in the ED and counseled to follow-up with PCP, otherwise return to the ED for new or worsening symptoms.  Patient and son agree with plan.      FINAL CLINICAL IMPRESSION(S) / ED DIAGNOSES   Final diagnoses:  RUQ abdominal pain  Pneumonia of right middle lobe  due to infectious organism     Rx / DC Orders   ED Discharge Orders          Ordered    cefpodoxime  (VANTIN ) 200 MG tablet  2 times daily        09/16/23 1953    azithromycin  (ZITHROMAX  Z-PAK) 250 MG tablet        09/16/23 1953             Note:  This document was prepared using Dragon voice recognition software and may include unintentional dictation errors.   Palin Carlin, MD 09/16/23 [phone]

## 2023-09-23 ENCOUNTER — Other Ambulatory Visit: Payer: Self-pay

## 2023-09-25 ENCOUNTER — Other Ambulatory Visit: Payer: Self-pay

## 2023-09-29 ENCOUNTER — Other Ambulatory Visit: Payer: Self-pay

## 2023-10-03 DIAGNOSIS — G8929 Other chronic pain: Secondary | ICD-10-CM | POA: Diagnosis not present

## 2023-10-03 DIAGNOSIS — M545 Low back pain, unspecified: Secondary | ICD-10-CM | POA: Diagnosis not present

## 2023-10-03 DIAGNOSIS — M7918 Myalgia, other site: Secondary | ICD-10-CM | POA: Diagnosis not present

## 2023-10-03 DIAGNOSIS — M5412 Radiculopathy, cervical region: Secondary | ICD-10-CM | POA: Diagnosis not present

## 2023-10-17 DIAGNOSIS — J0100 Acute maxillary sinusitis, unspecified: Secondary | ICD-10-CM | POA: Diagnosis not present

## 2023-10-17 DIAGNOSIS — E1122 Type 2 diabetes mellitus with diabetic chronic kidney disease: Secondary | ICD-10-CM | POA: Diagnosis not present

## 2023-10-17 DIAGNOSIS — Z1331 Encounter for screening for depression: Secondary | ICD-10-CM | POA: Diagnosis not present

## 2023-10-17 DIAGNOSIS — C9210 Chronic myeloid leukemia, BCR/ABL-positive, not having achieved remission: Secondary | ICD-10-CM | POA: Diagnosis not present

## 2023-10-17 DIAGNOSIS — Z6831 Body mass index (BMI) 31.0-31.9, adult: Secondary | ICD-10-CM | POA: Diagnosis not present

## 2023-10-17 DIAGNOSIS — E66813 Obesity, class 3: Secondary | ICD-10-CM | POA: Diagnosis not present

## 2023-10-17 DIAGNOSIS — N1831 Chronic kidney disease, stage 3a: Secondary | ICD-10-CM | POA: Diagnosis not present

## 2023-10-17 DIAGNOSIS — Z0000 Encounter for general adult medical examination without abnormal findings: Secondary | ICD-10-CM | POA: Diagnosis not present

## 2023-10-17 DIAGNOSIS — I129 Hypertensive chronic kidney disease with stage 1 through stage 4 chronic kidney disease, or unspecified chronic kidney disease: Secondary | ICD-10-CM | POA: Diagnosis not present

## 2023-10-21 ENCOUNTER — Other Ambulatory Visit: Payer: Self-pay | Admitting: Pharmacy Technician

## 2023-10-21 ENCOUNTER — Telehealth: Payer: Self-pay | Admitting: Psychiatry

## 2023-10-21 ENCOUNTER — Other Ambulatory Visit: Payer: Self-pay

## 2023-10-21 NOTE — Telephone Encounter (Signed)
 Received new referral for patient to return to office. Patient has been dismissed from practice and per note was changing providers. And due to attendance was dismissed. Message left to call and discuss

## 2023-10-21 NOTE — Progress Notes (Signed)
 Specialty Pharmacy Refill Coordination Note  Hetvi Kittleson is a 68 y.o. female contacted today regarding refills of specialty medication(s) Bosutinib (BOSULIF )  I spoke with patient's son, Lynwood.  Patient requested Delivery   Delivery date: 10/23/23   Verified address: [address] [place] [address]   Medication will be filled on 10/22/2023.   Marco Raper (Patty) Chet Burnet, CPhT  [facility], Zelda Salmon, [facility] Patient Advocate Specialist III Phone: [phone]  Fax: [phone]

## 2023-10-22 ENCOUNTER — Other Ambulatory Visit: Payer: Self-pay

## 2023-10-24 DIAGNOSIS — M25562 Pain in left knee: Secondary | ICD-10-CM | POA: Diagnosis not present

## 2023-10-24 DIAGNOSIS — Z79899 Other long term (current) drug therapy: Secondary | ICD-10-CM | POA: Diagnosis not present

## 2023-10-24 DIAGNOSIS — M25561 Pain in right knee: Secondary | ICD-10-CM | POA: Diagnosis not present

## 2023-10-24 DIAGNOSIS — Z79891 Long term (current) use of opiate analgesic: Secondary | ICD-10-CM | POA: Diagnosis not present

## 2023-11-01 ENCOUNTER — Emergency Department

## 2023-11-01 ENCOUNTER — Other Ambulatory Visit: Payer: Self-pay

## 2023-11-01 ENCOUNTER — Emergency Department
Admission: EM | Admit: 2023-11-01 | Discharge: 2023-11-01 | Disposition: A | Discharge status: Home / Self Care | Attending: Emergency Medicine | Admitting: Emergency Medicine

## 2023-11-01 DIAGNOSIS — R079 Chest pain, unspecified: Secondary | ICD-10-CM | POA: Diagnosis not present

## 2023-11-01 DIAGNOSIS — W06XXXA Fall from bed, initial encounter: Secondary | ICD-10-CM | POA: Insufficient documentation

## 2023-11-01 DIAGNOSIS — M25551 Pain in right hip: Secondary | ICD-10-CM | POA: Diagnosis not present

## 2023-11-01 DIAGNOSIS — R739 Hyperglycemia, unspecified: Secondary | ICD-10-CM | POA: Diagnosis not present

## 2023-11-01 DIAGNOSIS — E1122 Type 2 diabetes mellitus with diabetic chronic kidney disease: Secondary | ICD-10-CM | POA: Diagnosis not present

## 2023-11-01 DIAGNOSIS — R519 Headache, unspecified: Secondary | ICD-10-CM | POA: Diagnosis not present

## 2023-11-01 DIAGNOSIS — S20211A Contusion of right front wall of thorax, initial encounter: Secondary | ICD-10-CM | POA: Diagnosis not present

## 2023-11-01 DIAGNOSIS — R0781 Pleurodynia: Secondary | ICD-10-CM | POA: Diagnosis not present

## 2023-11-01 DIAGNOSIS — S0990XA Unspecified injury of head, initial encounter: Secondary | ICD-10-CM | POA: Diagnosis not present

## 2023-11-01 DIAGNOSIS — S79911A Unspecified injury of right hip, initial encounter: Secondary | ICD-10-CM | POA: Diagnosis not present

## 2023-11-01 DIAGNOSIS — M25552 Pain in left hip: Secondary | ICD-10-CM | POA: Diagnosis not present

## 2023-11-01 DIAGNOSIS — R930 Abnormal findings on diagnostic imaging of skull and head, not elsewhere classified: Secondary | ICD-10-CM | POA: Diagnosis not present

## 2023-11-01 DIAGNOSIS — M25561 Pain in right knee: Secondary | ICD-10-CM | POA: Insufficient documentation

## 2023-11-01 DIAGNOSIS — M25562 Pain in left knee: Secondary | ICD-10-CM | POA: Insufficient documentation

## 2023-11-01 DIAGNOSIS — W19XXXA Unspecified fall, initial encounter: Secondary | ICD-10-CM

## 2023-11-01 DIAGNOSIS — Z4789 Encounter for other orthopedic aftercare: Secondary | ICD-10-CM | POA: Diagnosis not present

## 2023-11-01 DIAGNOSIS — Z043 Encounter for examination and observation following other accident: Secondary | ICD-10-CM | POA: Diagnosis not present

## 2023-11-01 DIAGNOSIS — M47816 Spondylosis without myelopathy or radiculopathy, lumbar region: Secondary | ICD-10-CM | POA: Diagnosis not present

## 2023-11-01 DIAGNOSIS — Z981 Arthrodesis status: Secondary | ICD-10-CM | POA: Diagnosis not present

## 2023-11-01 DIAGNOSIS — G319 Degenerative disease of nervous system, unspecified: Secondary | ICD-10-CM | POA: Diagnosis not present

## 2023-11-01 DIAGNOSIS — N189 Chronic kidney disease, unspecified: Secondary | ICD-10-CM | POA: Diagnosis not present

## 2023-11-01 NOTE — ED Notes (Signed)
 First nurse note:  To ED [facility] from home for mechanical fall today--slipped out of bed, fell forward. No head trauma. Only complaint is R rib pain under R breast, 8/10. Is guarding the area. Also had mechanical fall yesterday but rib pain is new from this fall.   EMS VS: 147/78, HR 120, 18RR, 91% RA, CBG 202 hx DM, 99.5 oral  Recent UTI about 1 week ago

## 2023-11-01 NOTE — ED Provider Notes (Signed)
 [facility] Provider Note    Event Date/Time   First MD Initiated Contact with Patient 11/01/23 1219     (approximate)   History   Fall   HPI  Tara Levine is a 68 y.o. female with a history of diabetes, chronic pain, CKD, fibromyalgia who presents with complaints of pain in her knees, right hip, right chest after a fall 2 days ago.  No head injury reported     Physical Exam   Triage Vital Signs: ED Triage Vitals  Encounter Vitals Group     BP 11/01/23 1151 (!) 110/56     Girls Systolic BP Percentile --      Girls Diastolic BP Percentile --      Boys Systolic BP Percentile --      Boys Diastolic BP Percentile --      Pulse Rate 11/01/23 1151 100     Resp 11/01/23 1151 16     Temp 11/01/23 1151 99.4 F (37.4 C)     Temp Source 11/01/23 1151 Oral     SpO2 11/01/23 1151 92 %     Weight --      Height --      Head Circumference --      Peak Flow --      Pain Score 11/01/23 1152 8     Pain Loc --      Pain Education --      Exclude from Growth Chart --     Most recent vital signs: Vitals:   11/01/23 1151  BP: (!) 110/56  Pulse: 100  Resp: 16  Temp: 99.4 F (37.4 C)  SpO2: 92%     General: Awake, no distress.  CV:  Good peripheral perfusion.  Mild chest wall tenderness along the right lower anterior chest, no bruising or bony abnormalities palpated Resp:  Normal effort.  Abd:  No distention.  Soft, nontender, reassuring exam Other:  No pain with axial load on both hips, good flexion at the knees bilaterally, no significant swelling, no vertebral tenderness to palpation Normal range of motion of the upper extremities   ED Results / Procedures / Treatments   Labs (all labs ordered are listed, but only abnormal results are displayed) Labs Reviewed - No data to display   EKG    RADIOLOGY X-rays of the ribs, lumbar spine, knees, hips without acute abnormalities    PROCEDURES:  Critical Care performed:    Procedures   MEDICATIONS ORDERED IN ED: Medications - No data to display   IMPRESSION / MDM / ASSESSMENT AND PLAN / ED COURSE  I reviewed the triage vital signs and the nursing notes. Patient's presentation is most consistent with acute complicated illness / injury requiring diagnostic workup.  Presents after a fall as described above, primarily her complaint is in the right chest wall where she is tender, differential includes chest wall contusion, rib fracture.  However she also complains of pain in her hips pelvis and knees, unclear if this is chronic pain or not  X-rays pending   X-rays are reassuring, no acute fractures  ----------------------------------------- 2:28 PM on 11/01/2023 ----------------------------------------- Patient ambulates well with walker, no issues, no indication for admission at this time, appropriate for discharge with outpatient follow-up.     FINAL CLINICAL IMPRESSION(S) / ED DIAGNOSES   Final diagnoses:  Fall, initial encounter  Chest wall contusion, right, initial encounter     Rx / DC Orders   ED Discharge Orders  None        Note:  This document was prepared using Dragon voice recognition software and may include unintentional dictation errors.   Arlander Charleston, MD 11/01/23 1450
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
## 2023-11-12 ENCOUNTER — Telehealth: Payer: Self-pay | Admitting: Oncology

## 2023-11-12 NOTE — Telephone Encounter (Signed)
 Pt called and states her son has a per op appt on 9/19 at 11:15 and she needs to rs her appts due to him having surgery on her shoulder.

## 2023-11-13 ENCOUNTER — Other Ambulatory Visit (HOSPITAL_COMMUNITY): Payer: Self-pay

## 2023-11-14 ENCOUNTER — Inpatient Hospital Stay

## 2023-11-14 ENCOUNTER — Inpatient Hospital Stay: Admitting: Oncology

## 2023-11-18 ENCOUNTER — Other Ambulatory Visit: Payer: Self-pay

## 2023-11-18 NOTE — Progress Notes (Signed)
 Specialty Pharmacy Refill Coordination Note  Tara Levine is a 68 y.o. female contacted today regarding refills of specialty medication(s) Bosutinib (BOSULIF )   Patient requested Delivery   Delivery date: 11/21/23   Verified address: [address] [place]  [id number]   Medication will be filled on 11/20/23.

## 2023-11-19 ENCOUNTER — Other Ambulatory Visit: Payer: Self-pay

## 2023-11-20 ENCOUNTER — Other Ambulatory Visit: Payer: Self-pay

## 2023-11-27 ENCOUNTER — Other Ambulatory Visit: Payer: Self-pay

## 2023-11-28 ENCOUNTER — Ambulatory Visit: Admitting: Oncology

## 2023-12-01 ENCOUNTER — Other Ambulatory Visit: Payer: Self-pay

## 2023-12-01 DIAGNOSIS — M47816 Spondylosis without myelopathy or radiculopathy, lumbar region: Secondary | ICD-10-CM | POA: Diagnosis not present

## 2023-12-01 DIAGNOSIS — M25552 Pain in left hip: Secondary | ICD-10-CM | POA: Diagnosis not present

## 2023-12-01 DIAGNOSIS — G8929 Other chronic pain: Secondary | ICD-10-CM | POA: Diagnosis not present

## 2023-12-01 DIAGNOSIS — M25551 Pain in right hip: Secondary | ICD-10-CM | POA: Diagnosis not present

## 2023-12-01 NOTE — Progress Notes (Signed)
 Specialty Pharmacy Ongoing Clinical Assessment Note  Tara Levine is a 68 y.o. female who is being followed by the specialty pharmacy service for RxSp Oncology   Patient's specialty medication(s) reviewed today: Bosutinib (BOSULIF )   Missed doses in the last 4 weeks: 0   Patient/Caregiver did not have any additional questions or concerns.   Therapeutic benefit summary: Patient is achieving benefit   Adverse events/side effects summary: No adverse events/side effects   Patient's therapy is appropriate to: Continue    Goals Addressed             This Visit's Progress    Achieve or maintain remission   On track    Patient is on track. Patient will maintain adherence. Per Dr. Layvonne office visit notes from 08/15/23, patient is in molecular remission.           Follow up: 6 months  Silvano LOISE Dolly Specialty Pharmacist

## 2023-12-09 DIAGNOSIS — E1122 Type 2 diabetes mellitus with diabetic chronic kidney disease: Secondary | ICD-10-CM | POA: Diagnosis not present

## 2023-12-09 DIAGNOSIS — B372 Candidiasis of skin and nail: Secondary | ICD-10-CM | POA: Diagnosis not present

## 2023-12-09 DIAGNOSIS — N3000 Acute cystitis without hematuria: Secondary | ICD-10-CM | POA: Diagnosis not present

## 2023-12-09 DIAGNOSIS — N1831 Chronic kidney disease, stage 3a: Secondary | ICD-10-CM | POA: Diagnosis not present

## 2023-12-09 DIAGNOSIS — Z9181 History of falling: Secondary | ICD-10-CM | POA: Diagnosis not present

## 2023-12-09 DIAGNOSIS — I129 Hypertensive chronic kidney disease with stage 1 through stage 4 chronic kidney disease, or unspecified chronic kidney disease: Secondary | ICD-10-CM | POA: Diagnosis not present

## 2023-12-09 NOTE — Progress Notes (Signed)
 Chief Complaint:   No chief complaint on file.   Subjective:   Tara Levine is a 68 y.o. female in today for an acute concern  History of Present Illness Tara Levine is a 68 year old female with recurrent urinary tract infections who presents with UTI symptoms. She is doing a video visit today.  Does not drive and did not have transportation to come to the clinic.  She experiences urgency, frequency, and burning during urination. She is concerned about falling again due to these symptoms, as she has had several falls in the past, one of which required a visit to the emergency room.  Also has history of incontinence and overactive bladder from her diabetes.  Hence recurrent UTIs.  She reports a yeast rash under her breast,  She lives alone and uses a cane for mobility. Her son, Tara Levine, who lives in [place], assists her with groceries and medication pick-up. He is also designated to make medical decisions on her behalf if necessary. Another son passed away, her older son lives in [place] .   Current Outpatient Medications  Medication Sig Dispense Refill  . albuterol  90 mcg/actuation inhaler Inhale into the lungs As directed    . ALPRAZolam  (XANAX ) 1 MG tablet Take 0.5 mg by mouth 3 (three) times daily as needed.      . ascorbic acid , vitamin C, (VITAMIN C) 1000 MG tablet Take 1 tablet by mouth once daily    . atorvastatin  (LIPITOR) 20 MG tablet TAKE ONE TABLET BY MOUTH ONE TIME DAILY 90 tablet 1  . baclofen  (LIORESAL ) 10 MG tablet Take 10 mg by mouth 3 (three) times daily as needed    . blood glucose diagnostic (ACCU-CHEK AVIVA) test strip Use 1 each 3 (three) times daily. Use as instructed. 100 each 11  . BLOOD SUGAR DIAGNOSTIC (ACCU-CHEK AVIVA MISC) Use. One accu-check meter    . blood-glucose meter (ACCU-CHEK AVIVA PLUS METER) Misc Use as directed. 1 each 0  . bosutinib 400 mg Tab Take by mouth    . DULoxetine  (CYMBALTA ) 20 MG DR capsule TAKE ONE CAPSULE BY MOUTH EVERY  MORNING, ONE AT NOON, ONE IN THE EVENING, AND ONE AT BEDTIME (Patient taking differently: 4 (four) times daily) 120 capsule 5  . FUROsemide  (LASIX ) 40 MG tablet TAKE ONE TABLET BY MOUTH ONE TIME DAILY AS NEEDED FOR EDEMA 30 tablet 0  . meclizine  (ANTIVERT ) 25 mg tablet Take 1 tablet (25 mg total) by mouth 3 (three) times daily as needed for Dizziness 30 tablet 0  . montelukast  (SINGULAIR ) 10 mg tablet TAKE ONE TABLET BY MOUTH ONE TIME DAILY 90 tablet 1  . olopatadine  (PATANOL) 0.1 % ophthalmic solution Place 1 drop into both eyes 2 (two) times daily 5 mL 3  . omeprazole  (PRILOSEC) 20 MG DR capsule TAKE ONE CAPSULE BY MOUTH ONE TIME DAILY 90 capsule 1  . oxyCODONE -acetaminophen  (PERCOCET) 7.5-325 mg tablet Take 1 tablet by mouth every 8 (eight) hours as needed for Pain    . pregabalin  (LYRICA ) 300 MG capsule TAKE ONE CAPSULE BY MOUTH TWICE A DAY 60 capsule 0  . promethazine  (PHENERGAN ) 25 MG tablet Take 25 mg by mouth every 6 (six) hours as needed.      . tiotropium-olodateroL (STIOLTO RESPIMAT) 2.5-2.5 mcg/actuation inhaler Inhale 2 inhalations into the lungs once daily 4 g 2  . tirzepatide (MOUNJARO) 15 mg/0.5 mL pen injector Inject 0.5 mLs (15 mg total) subcutaneously every 7 (seven) days 2 mL 2  . fluconazole  (DIFLUCAN ) 100  MG tablet Take 1 tablet (100 mg total) by mouth once daily for 7 days 7 tablet 0  . nitrofurantoin, macrocrystal-monohydrate, (MACROBID) 100 MG capsule Take 1 capsule (100 mg total) by mouth 2 (two) times daily for 5 days 10 capsule 0  . nystatin  (MYCOSTATIN ) 100,000 unit/gram cream Apply topically 2 (two) times daily 30 g 1   No current facility-administered medications for this visit.    Allergies as of 12/09/2023 - Reviewed 10/17/2023  Allergen Reaction Noted  . Morphine  Rash 05/17/2014  . Aspirin Other (See Comments) 02/20/2022  . Adhesive tape-silicones Rash 05/17/2014  . Codeine  Other (See Comments) 05/17/2014  . Latex Rash 01/03/2011  . Nsaids (non-steroidal  anti-inflammatory drug) Rash 08/12/2012  . Silicone Rash 01/28/2014    Past Medical History:  Diagnosis Date  . 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 Depression   . Diabetes mellitus type 2, uncomplicated (CMS/HHS-HCC)   . Gastritis    EGD 04/02/12  . Hyperlipidemia   . Hypertension   . Spinal stenosis    s/p rod placement    Past Surgical History:  Procedure Laterality Date  . EGD  04/02/2012   Gastritis: No repeat per RTE  . COLONOSCOPY  04/02/2012   PH Colon Polyps (outside facility): [facility] 03/2017; Recall Ltr mailed 03/10/2017 (dh)  . BACK SURGERY     s/p rod placement     Family History  Problem Relation Name Age of Onset  . Coronary Artery Disease (Blocked arteries around heart) Other    . Diabetes type II Other    . Prostate cancer Other      Social History:  reports that she has never smoked. She has never used smokeless tobacco. She reports that she does not drink alcohol  and does not use drugs.  Results for orders placed or performed in visit on 11/03/23  Order   Narrative   Ordered by an unspecified provider.      ROS:  General: Chills but no fevers.  Feeling sick and tired.  No change in weight Skin:   No skin lesions, growths, masses, rashes, pruritus  HEENT: No change in vision or hearing. No pain or difficulty with swallowing Respiratory: No cough or shortness of breath CV:  No chest pain or palpitations GI:  No pain, dyspepsia or change in bowel habits GU:  Increased frequency, burning on urination MSK:  Chronic low back pain and bilateral leg weakness neurological: No headaches, changes in mental status, loss of sensation or strength   Psychological: No anxiety insomnia or depression  Objective:   There is no height or weight on file to calculate BMI.  LMP  (LMP Unknown)   Exam limited due to being virtual visit  General: WD/WN female, does not appear to be in distress Respiratory: Breathing is even.  No distress  identified.  Able to speak in full sentences.  No use of accessory muscles to breathe. Musculoskeletal: Laying in bed today Neuro: CN grossly intact.  Speech is clear.    Assessment/Plan:   Type 2 diabetes mellitus with chronic kidney disease, without long-term current use of insulin , unspecified CKD stage (CMS/HHS-HCC)  (primary encounter diagnosis) Stage 3a chronic kidney disease (CMS-HCC) CML (chronic myelocytic leukemia) (CMS/HHS-HCC) Acute cystitis without hematuria Candidal intertrigo  Assessment & Plan   Urinary tract infection with urinary incontinence and recurrent falls - Recurrent UTI with urgency, frequency, and burning.  -Farxiga discontinued for the same reason - Most recent CT scan did not show any kidney stones. -  Unable to have transportation to come to the clinic to do the urine test - Will prescribe antibiotics.  Recommend over-the-counter UTI dipstick tests  2.  High risk for falls- - Home health physical therapy ordered - Has had several falls lately.  Poor social situation.  Will need a walker but cannot fit in her home.  3. Type 2 diabetes mellitus with hyperglycemia -Not on Farxiga because of recurrent UTIs - Continue Mounjaro at 15 mg subcu weekly - Needs to check her blood sugars more regularly - Diabetes education provided - Home health nursing ordered as well.  4. Primary hypertension - Blood pressure well-controlled at 110/68. - Low-salt diet.  Takes Lasix  as needed for lower extremity edema  5. Intertrigo (yeast rash under breast) Yeast rash under breast,  . - Prescribe nystatin  antifungal cream for yeast rash - Also oral Diflucan  prescribed..   This video encounter was conducted with the patient's (or proxy's) verbal consent via secure, interactive audio and video telecommunications while in clinic/office/hospital.  The patient (or proxy) was instructed to have this encounter in a suitably private space and to only have persons present to  whom they give permission to participate. In addition, patient identity was confirmed by use of name plus two identifiers.  This visit was coded based on medical decision making (MDM).      Goals     . Follow my doctor's care plan        LAVENIA BEAVER, MD  Portions of this note were created using dictation software and may contain typographical errors.

## 2023-12-12 ENCOUNTER — Other Ambulatory Visit: Payer: Self-pay

## 2023-12-12 ENCOUNTER — Encounter: Payer: Self-pay | Admitting: Oncology

## 2023-12-12 ENCOUNTER — Inpatient Hospital Stay: Attending: Oncology

## 2023-12-12 ENCOUNTER — Inpatient Hospital Stay (HOSPITAL_BASED_OUTPATIENT_CLINIC_OR_DEPARTMENT_OTHER): Admitting: Oncology

## 2023-12-12 VITALS — BP 110/73 | HR 80 | Temp 97.3°F | Resp 18 | Wt 194.3 lb

## 2023-12-12 DIAGNOSIS — Z7985 Long-term (current) use of injectable non-insulin antidiabetic drugs: Secondary | ICD-10-CM | POA: Insufficient documentation

## 2023-12-12 DIAGNOSIS — R61 Generalized hyperhidrosis: Secondary | ICD-10-CM | POA: Insufficient documentation

## 2023-12-12 DIAGNOSIS — N1831 Chronic kidney disease, stage 3a: Secondary | ICD-10-CM | POA: Diagnosis not present

## 2023-12-12 DIAGNOSIS — C9210 Chronic myeloid leukemia, BCR/ABL-positive, not having achieved remission: Secondary | ICD-10-CM

## 2023-12-12 DIAGNOSIS — R5383 Other fatigue: Secondary | ICD-10-CM | POA: Diagnosis not present

## 2023-12-12 DIAGNOSIS — Z5111 Encounter for antineoplastic chemotherapy: Secondary | ICD-10-CM

## 2023-12-12 DIAGNOSIS — C9212 Chronic myeloid leukemia, BCR/ABL-positive, in relapse: Secondary | ICD-10-CM | POA: Diagnosis not present

## 2023-12-12 DIAGNOSIS — N390 Urinary tract infection, site not specified: Secondary | ICD-10-CM | POA: Diagnosis not present

## 2023-12-12 LAB — CBC WITH DIFFERENTIAL (CANCER CENTER ONLY)
Abs Immature Granulocytes: 0.02 K/uL (ref 0.00–0.07)
Basophils Absolute: 0 K/uL (ref 0.0–0.1)
Basophils Relative: 0 %
Eosinophils Absolute: 0.1 K/uL (ref 0.0–0.5)
Eosinophils Relative: 3 %
HCT: 43.1 % (ref 36.0–46.0)
Hemoglobin: 14.5 g/dL (ref 12.0–15.0)
Immature Granulocytes: 0 %
Lymphocytes Relative: 7 %
Lymphs Abs: 0.3 K/uL — ABNORMAL LOW (ref 0.7–4.0)
MCH: 28.3 pg (ref 26.0–34.0)
MCHC: 33.6 g/dL (ref 30.0–36.0)
MCV: 84 fL (ref 80.0–100.0)
Monocytes Absolute: 0.4 K/uL (ref 0.1–1.0)
Monocytes Relative: 9 %
Neutro Abs: 4 K/uL (ref 1.7–7.7)
Neutrophils Relative %: 81 %
Platelet Count: 193 K/uL (ref 150–400)
RBC: 5.13 MIL/uL — ABNORMAL HIGH (ref 3.87–5.11)
RDW: 13.8 % (ref 11.5–15.5)
WBC Count: 5 K/uL (ref 4.0–10.5)
nRBC: 0 % (ref 0.0–0.2)

## 2023-12-12 LAB — CMP (CANCER CENTER ONLY)
ALT: 15 U/L (ref 0–44)
AST: 33 U/L (ref 15–41)
Albumin: 3.7 g/dL (ref 3.5–5.0)
Alkaline Phosphatase: 60 U/L (ref 38–126)
Anion gap: 11 (ref 5–15)
BUN: 10 mg/dL (ref 8–23)
CO2: 24 mmol/L (ref 22–32)
Calcium: 9.3 mg/dL (ref 8.9–10.3)
Chloride: 100 mmol/L (ref 98–111)
Creatinine: 1.06 mg/dL — ABNORMAL HIGH (ref 0.44–1.00)
GFR, Estimated: 57 mL/min — ABNORMAL LOW (ref >60)
Glucose, Bld: 98 mg/dL (ref 70–99)
Potassium: 3.8 mmol/L (ref 3.5–5.1)
Sodium: 135 mmol/L (ref 135–145)
Total Bilirubin: 0.7 mg/dL (ref 0.0–1.2)
Total Protein: 6.9 g/dL (ref 6.5–8.1)

## 2023-12-12 MED ORDER — BOSUTINIB 100 MG PO TABS
400.0000 mg | ORAL_TABLET | Freq: Every day | ORAL | 3 refills | Status: AC
  Filled 2023-12-12 – 2023-12-16 (×2): qty 120, 30d supply, fill #0

## 2023-12-12 NOTE — Assessment & Plan Note (Signed)
 Treatment plan as listed above.

## 2023-12-12 NOTE — Progress Notes (Signed)
 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   Stage 3a chronic kidney disease (HCC) Encourage oral hydration and avoid nephrotoxins.    Orders Placed This Encounter  Procedures   BCR-ABL1 FISH    Standing Status:   Future    Expected Date:   03/13/2024    Expiration Date:   06/11/2024   BCR-ABL1, CML/ALL, PCR, QUANT    Standing Status:   Future    Expected Date:   03/13/2024    Expiration Date:   06/11/2024   CMP (Cancer Center only)    Standing Status:   Future    Expected Date:   03/13/2024    Expiration Date:   06/11/2024   CBC with Differential (Cancer Center Only)    Standing Status:   Future    Expected Date:   03/13/2024    Expiration Date:   06/11/2024   Follow-up in 3 months All questions were answered. The patient knows to call the clinic with any problems, questions or concerns.  Tara Cap, MD, PhD [facility] H[facility]ology Oncology 12/12/2023   Chief Complaint: Tara Levine is a 68 y.o. female presents for follow up of chronic myelogenous leukemia (CML)   PERTINENT ONCOLOGY HISTORY Patient previously followed up by TaraFinnegan and TaraCorcoran, patient switched care to me on 01/12/21 Extensive medical record review was performed by me  Patient was initially seen by Dr. Gabe at and Dr. Christa.  She was seen by Dr. Rudell on 12/28/2018. CML was diagnosed in 2011. Bone marrow on 11/2009 revealed CML in chronic phase with no increase in blast (4%).  B3a2 transcript was 60% (p210).   11/2009, patient was started on nilotinib 300 mg twice daily.  Because of weight gain and intolerance to neratinib, dose  was reduced to 150 mg twice daily. 12/2011 BCR ABL IS 0.11% 02/2012 nilotinib was increased to 600 mg twice daily 05/2012 patient was intubated in ICU for possible aspiration, narcotic effects, cardiac arrest and septic shock. nilotinib was held.  Restarted at a dose of 200 mg p.o. daily with dose escalation beginning May 2014. 08/2012 BCR ABL 0.55%  03/2013 patient was switched to bosutinib 400 mg daily. Patient was noticed to have medical noncompliance, missing multiple appointments. Concern was raised about an increasing BCR ABL transcript.  06/12/2017, patient was seen by Tara Levine for second opinion. Per note, patient continued bosutinib and to she lost her insurance in July 2019. BCR-ABLR was < 0.0032% therefore Dr. Rudell recommend patient to stay off bosutinib.  Patient then lost follow-up after that appointment.  Patient has psychiatric issues, major depression and anxiety..  She is very anxious in the clinic today.  Accompanied by her Sister Tara Levine. Patient reports extremely fatigued lately.  Also night sweats.  She restarted bosutinib using leftover supply and stayed on the medication for months.  Last taking bosutinib a week ago. She lives at home by herself.  Widowed 10/15/2020, hemoglobin 7.5, MCV 89.1.  White count 6.0.  Platelet 267. Creatinine 1.44   INTERVAL HISTORY Tara Levine is a 68 y.o. female who has above history reviewed by me today presents for follow up visit for management of CML  She is managing her chronic  myeloid leukemia with bosutinib 400 mg daily, which has effectively controlled her condition. Her BCR-ABL levels from four months ago were stable and showed improvement compared to previous results.  She experiences frequent urinary tract infections and is currently on antibiotics for a UTI he takes precautions to stay hydrated by drinking plenty of water. She is concerned about potential kidney damage.  She is also taking Mounjaro, which  helps control her appetite.  Past Medical History:  Diagnosis Date   CML (chronic myelocytic leukemia) (HCC)    Depression    DM type 2 (diabetes mellitus, type 2) (HCC)    Environmental allergies    History of gastric ulcer    HLD (hyperlipidemia)    IDA (iron deficiency anemia)    chronic   Leukemia (HCC)    Leukemia, chronic myeloid (HCC)    Osteoarthritis    Panic disorder    PSS (progressive systemic sclerosis) (HCC)    Unspecified essential hypertension     Past Surgical History:  Procedure Laterality Date   back and neck surgery  1999, 2009   had rods placed   CARDIAC CATHETERIZATION  1990   [place]  [facility]) No blackages found.    CARPAL TUNNEL RELEASE     PARTIAL HYSTERECTOMY     age 22, no cancer    Family History  Problem Relation Age of Onset   Diabetes Mother    Hypertension Mother    Heart disease Mother    Prostate cancer Father    Heart disease Father    Diabetes Brother     Social History:  reports that she has never smoked. She has never used smokeless tobacco. She reports that she does not drink alcohol  and does not use drugs.  Patient is widowed.  Lives by herself.  Allergies:  Allergies  Allergen Reactions   Morphine  Rash    Headache too   Codeine  Other (See Comments)    Reaction: Unknown    Latex Other (See Comments)    Reaction:  Unknown    Morphine  And Codeine  Other (See Comments)    Reaction:  Unknown    Relafen [Nabumetone] Hives   Nsaids Rash   Tape Other (See Comments) and Rash    Reaction:  Unknown     Current Medications: Current Outpatient Medications  Medication Sig Dispense Refill   ACCU-CHEK GUIDE test strip TEST DAILY AND AS DIRECTED. 300 strip 10   Accu-Chek Softclix Lancets lancets TEST BLOOD SUGAR THREE TIMES DAILY AS INSTRUCTED 300 each 10   albuterol  (PROVENTIL  HFA;VENTOLIN  HFA) 108 (90 BASE) MCG/ACT inhaler Inhale 2 puffs into the lungs every 6 (six) hours as needed for wheezing or shortness of  breath.     albuterol  (PROVENTIL ) (2.5 MG/3ML) 0.083% nebulizer solution INHALE CONTENTS OF 1 VIAL VIA NEBULIZER THREE TIMES A DAY prn     Alcohol  Swabs  (DROPSAFE ALCOHOL  PREP) 70 % PADS USE THREE TIMES DAILY  TO CHECK BLOOD SUGAR 300 each 10   Ascorbic Acid  (VITAMIN C) 1000 MG tablet Take 3,000 mg by mouth daily.     atorvastatin  (LIPITOR) 20 MG tablet Take 1 tablet (20 mg total) by mouth daily. 90 tablet 3   baclofen  (LIORESAL ) 10 MG tablet Take 10 mg by mouth 2 (two) times daily. 1/2 tab     Blood Glucose Monitoring Suppl (GLUCOCOM BLOOD GLUCOSE MONITOR) DEVI Accu -chek 1 each 0   cetirizine (ZYRTEC) 10 MG tablet Take 20 mg by mouth daily as needed.  Cholecalciferol  (VITAMIN D  PO) Take 1 tablet by mouth daily.     co-enzyme Q-10 50 MG capsule Take 50 mg by mouth daily.     DULoxetine  (CYMBALTA ) 20 MG capsule Take 1 capsule (20 mg total) by mouth in the morning, at noon, in the evening, and at bedtime. 120 capsule 3   fluconazole  (DIFLUCAN ) 100 MG tablet Take 100 mg by mouth.     fluticasone  (FLONASE ) 50 MCG/ACT nasal spray Place 1-2 sprays into both nostrils as needed for rhinitis.      insulin  aspart (NOVOLOG ) 100 UNIT/ML injection Inject 10 Units into the skin 3 (three) times daily as needed for high blood sugar. Pt reports she is taking 2-8 units per sliding scale based on glucose     Omega-3 Fatty Acids (OMEGA 3 PO) Take 3 capsules by mouth daily.     Oxycodone  HCl 10 MG TABS Take 10 mg by mouth as needed.     pregabalin  (LYRICA ) 300 MG capsule Take 1 capsule (300 mg total) by mouth 2 (two) times daily. 60 capsule 5   Semaglutide , 1 MG/DOSE, 4 MG/3ML SOPN Inject 1 mg as directed once a week. 3 mL 0   vitamin B-12 (CYANOCOBALAMIN ) 1000 MCG tablet Take 4,000 mcg by mouth daily.     amLODipine  (NORVASC ) 5 MG tablet Take 1 tablet (5 mg total) by mouth daily. (Patient not taking: Reported on 12/12/2023) 90 tablet 0   bosutinib (BOSULIF ) 100 MG tablet Take 4 tablets (400 mg total) by mouth  daily with breakfast. Take with food. 120 tablet 3   celecoxib  (CELEBREX ) 100 MG capsule  (Patient not taking: Reported on 12/12/2023)     Continuous Blood Gluc Receiver (DEXCOM G7 RECEIVER) DEVI 1 each by Does not apply route every 14 (fourteen) days. (Patient not taking: Reported on 12/12/2023) 1 each 3   Continuous Blood Gluc Sensor (DEXCOM G7 SENSOR) MISC 1 each by Does not apply route every 14 (fourteen) days. (Patient not taking: Reported on 12/12/2023) 12 each 1   empagliflozin  (JARDIANCE ) 25 MG TABS tablet Take 1 tablet (25 mg total) by mouth daily before breakfast. (Patient not taking: Reported on 12/12/2023) 30 tablet 3   furosemide  (LASIX ) 40 MG tablet Take 1 tablet (40 mg total) by mouth daily. (Patient not taking: Reported on 12/12/2023) 30 tablet 3   NOVOLOG  FLEXPEN 100 UNIT/ML FlexPen Inject into the skin. (Patient not taking: Reported on 12/12/2023)     nystatin  (MYCOSTATIN /NYSTOP ) powder Apply 1 Application topically 3 (three) times daily. (Patient not taking: Reported on 12/12/2023) 60 g 2   ondansetron  (ZOFRAN -ODT) 4 MG disintegrating tablet Take 1 tablet (4 mg total) by mouth every 8 (eight) hours as needed for nausea or vomiting. (Patient not taking: Reported on 12/12/2023) 20 tablet 0   pantoprazole  (PROTONIX ) 40 MG tablet TAKE 1 TABLET BY MOUTH EVERY DAY (Patient not taking: Reported on 12/12/2023) 90 tablet 1   polyethylene glycol (MIRALAX  MIX-IN PAX) 17 g packet Take 17 g by mouth daily as needed. (Patient not taking: Reported on 12/12/2023) 14 each 0   pregabalin  (LYRICA ) 300 MG capsule Take 1 capsule (300 mg total) by mouth 2 (two) times daily. (Patient not taking: Reported on 12/12/2023) 60 capsule 0   sucralfate  (CARAFATE ) 1 GM/10ML suspension Take 10 mLs (1 g total) by mouth 4 (four) times daily. (Patient not taking: Reported on 12/12/2023) 420 mL 1   No current facility-administered medications for this visit.    Review of Systems  Constitutional:  Positive for  malaise/fatigue. Negative for chills, fever and weight loss.       She does not feel good.  HENT:  Negative for congestion, ear pain, hearing loss, nosebleeds, sinus pain, sore throat and tinnitus.   Eyes: Negative.  Negative for blurred vision, double vision and photophobia.  Respiratory:  Negative for cough, hemoptysis, sputum production, shortness of breath and wheezing.   Cardiovascular:  Negative for chest pain, palpitations and PND.       CHF.  Gastrointestinal: Negative.  Negative for blood in stool, diarrhea, nausea and vomiting.  Genitourinary:  Negative for dysuria, hematuria and urgency.       Stress incontinence.  Musculoskeletal:  Positive for back pain (rods in spine) and joint pain (knees, shoulders, ankles, wrists). Negative for myalgias.       Scoliosis.  Skin:  Negative for rash.  Neurological:  Positive for sensory change (numbness in feet on Lyrica ). Negative for dizziness, tremors, speech change, seizures, weakness and headaches. Focal weakness: issues with balance. Endo/Heme/Allergies: Negative.  Does not bruise/bleed easily.  Psychiatric/Behavioral:  Positive for depression and memory loss (poor short term memory). Negative for substance abuse and suicidal ideas.   All other systems reviewed and are negative. Left axillary redness and tenderness    Vitals Blood pressure 110/73, pulse 80, temperature (!) 97.3 F (36.3 C), temperature source Tympanic, resp. rate 18, weight 194 lb 4.8 oz (88.1 kg), SpO2 94%.  Physical Exam Vitals and nursing note reviewed.  Constitutional:      General: She is not in acute distress.    Appearance: She is well-developed. She is obese. She is not diaphoretic.  HENT:     Head: Normocephalic and atraumatic.  Eyes:     General: No scleral icterus.    Conjunctiva/sclera: Conjunctivae normal.  Cardiovascular:     Rate and Rhythm: Normal rate and regular rhythm.  Pulmonary:     Effort: Pulmonary effort is normal. No respiratory  distress.     Breath sounds: No wheezing.  Abdominal:     Palpations: Abdomen is soft.  Musculoskeletal:        General: No swelling. Normal range of motion.     Cervical back: Normal range of motion.  Skin:    General: Skin is warm.  Neurological:     Mental Status: She is alert and oriented to person, place, and time.  Psychiatric:        Mood and Affect: Mood normal.     Appointment on 12/12/2023  Component Date Value Ref Range Status   Sodium 12/12/2023 135  135 - 145 mmol/L Final   Potassium 12/12/2023 3.8  3.5 - 5.1 mmol/L Final   Chloride 12/12/2023 100  98 - 111 mmol/L Final   CO2 12/12/2023 24  22 - 32 mmol/L Final   Glucose, Bld 12/12/2023 98  70 - 99 mg/dL Final   Glucose reference range applies only to samples taken after fasting for at least 8 hours.   BUN 12/12/2023 10  8 - 23 mg/dL Final   Creatinine 89/82/7974 1.06 (H)  0.44 - 1.00 mg/dL Final   Calcium  12/12/2023 9.3  8.9 - 10.3 mg/dL Final   Total Protein 89/82/7974 6.9  6.5 - 8.1 g/dL Final   Albumin 89/82/7974 3.7  3.5 - 5.0 g/dL Final   AST 89/82/7974 33  15 - 41 U/L Final   ALT 12/12/2023 15  0 - 44 U/L Final   Alkaline Phosphatase 12/12/2023 60  38 - 126 U/L Final   Total Bilirubin  12/12/2023 0.7  0.0 - 1.2 mg/dL Final   GFR, Estimated 12/12/2023 57 (L)  >60 mL/min Final   Comment: (NOTE) Calculated using the CKD-EPI Creatinine Equation (2021)    Anion gap 12/12/2023 11  5 - 15 Final   Performed at [facility], [address] [place], [place] [address]   WBC Count 12/12/2023 5.0  4.0 - 10.5 K/uL Final   RBC 12/12/2023 5.13 (H)  3.87 - 5.11 MIL/uL Final   Hemoglobin 12/12/2023 14.5  12.0 - 15.0 g/dL Final   HCT 89/82/7974 43.1  36.0 - 46.0 % Final   MCV 12/12/2023 84.0  80.0 - 100.0 fL Final   MCH 12/12/2023 28.3  26.0 - 34.0 pg Final   MCHC 12/12/2023 33.6  30.0 - 36.0 g/dL Final   RDW 89/82/7974 13.8  11.5 - 15.5 % Final   Platelet Count 12/12/2023 193  150 - 400 K/uL Final   nRBC  12/12/2023 0.0  0.0 - 0.2 % Final   Neutrophils Relative % 12/12/2023 81  % Final   Neutro Abs 12/12/2023 4.0  1.7 - 7.7 K/uL Final   Lymphocytes Relative 12/12/2023 7  % Final   Lymphs Abs 12/12/2023 0.3 (L)  0.7 - 4.0 K/uL Final   Monocytes Relative 12/12/2023 9  % Final   Monocytes Absolute 12/12/2023 0.4  0.1 - 1.0 K/uL Final   Eosinophils Relative 12/12/2023 3  % Final   Eosinophils Absolute 12/12/2023 0.1  0.0 - 0.5 K/uL Final   Basophils Relative 12/12/2023 0  % Final   Basophils Absolute 12/12/2023 0.0  0.0 - 0.1 K/uL Final   Immature Granulocytes 12/12/2023 0  % Final   Abs Immature Granulocytes 12/12/2023 0.02  0.00 - 0.07 K/uL Final   Performed at [facility], [address], [place], [place] [address]

## 2023-12-12 NOTE — Assessment & Plan Note (Signed)
 Encourage oral hydration and avoid nephrotoxins.

## 2023-12-12 NOTE — Assessment & Plan Note (Signed)
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

## 2023-12-16 ENCOUNTER — Other Ambulatory Visit (HOSPITAL_COMMUNITY): Payer: Self-pay

## 2023-12-16 ENCOUNTER — Other Ambulatory Visit: Payer: Self-pay

## 2023-12-17 LAB — BCR-ABL1 FISH
Cells Analyzed: 200
Cells Counted: 200

## 2023-12-18 ENCOUNTER — Other Ambulatory Visit: Payer: Self-pay

## 2023-12-19 LAB — BCR-ABL1, CML/ALL, PCR, QUANT
E1A2 Transcript: <0.0032 %
b2a2 transcript: <0.0032 %
b3a2 transcript: 0.1859 %

## 2024-03-11 ENCOUNTER — Inpatient Hospital Stay

## 2024-03-12 ENCOUNTER — Telehealth: Payer: Self-pay | Admitting: Oncology

## 2024-03-12 ENCOUNTER — Inpatient Hospital Stay

## 2024-03-12 ENCOUNTER — Telehealth: Payer: Self-pay | Admitting: *Deleted

## 2024-03-12 DIAGNOSIS — N1831 Chronic kidney disease, stage 3a: Secondary | ICD-10-CM

## 2024-03-12 DIAGNOSIS — C9210 Chronic myeloid leukemia, BCR/ABL-positive, not having achieved remission: Secondary | ICD-10-CM

## 2024-03-12 NOTE — Telephone Encounter (Addendum)
 "     NURSING SYMPTOM TRIAGE ASSESSMENT & DISPOSITION  Mode of interaction:  Telephone Date of symptom triage interaction:  03/12/24 Time of symptom triage interaction:  210pm  Cancer diagnosis:  CML (chronic myelocytic leukemia) (HCC)   Oral chemotherapy:  No, patient stopped Bosulif  months ago I think maybe October  Patient left vm. I have an apt on Monday for lab work and I see Dr. Babara on the 19'th virtually. I am unable to urinate. I can't stand up and walk and don't have any urge to pass any urine either. I am pretty sure I am dehydrated. I've been drinking water and even had a cup a caffeine  free coffee today. I know I shouldn't. I am trying to pass urine, but I don't have any urge to do so. I felt my urethra was burning like I was passing a stone.  Call returned. Caller verified using pt's full name and dob prior to discussing PHI     Patient has a h/o chronic UTIs and stage 3 CKD. She reports that she is very weak, lightheaded and dizzy. Patient experienced a pre-syncopal episode when attempting to ambulate, prior to my return phone call. I felt like I was going to pass out. She was trying to ambulate to the kitchen with a cane. She was able to sit down on a chair in kitchen. She called her son, who came to the house and assisted her back to bed safely.  She is currently sitting on her bed and notes that she is now alone in her house. She lives by herself.  She reports pelvic pain with inability to void. She reports that the pelvic pain is relieved by her laying on her side. In general, she has to push on her bladder to try to eliminate her urine.  Last void was last evening and had a scant urinary output (bright yellow). Urine did not have any odor. Did not notice any blood in her urine. Patient denies any sexual activity or trauma. Reports white vaginal discharge. Denies any fever. But reports frequent episodes of flushing and feeling hot. She does not smoke or drink alcohol .  Pt reports low back pain, but she had rods in her lower back. This pain effects her inability to ambulate at times.  History of frequent falls. Patient denies shortness of breath or difficulty breathing. Patient denies nausea or vomiting.  She has been out of her amlodipine  and many routine meds. Patient poor historian on meds. When discuss her concerns, she become emotionally distraught and crying through out the entire conversation. Patient's account of symptoms was a bit scattered throughout the conversation and needed some gentle redirection for clarity throughout the phone call.   I advised patient to call 911 due to her inability to ambulate, being alone and inability to void and the presyncope episode. Patient stated that she didn't want to go to the ER. She stated I need an antibiotics. I know have a UTI. I tested by urine out earlier in the week and it was all kinds of flags on the urine strip. I explained to the patient a urine culture would need to be performed to figure out first what is causing her symptoms before antibiotics could be prescribed. Patient again advised that given her presyncope event and inability to void greater than 48 hours, requires emergency evaluation.  I spent approximately 30 mins on the phone discussing patient's care with her.   Nurse Triage Priority:  Emergent (refer for emergent  medical intervention)  Barriers to Care:  See [facility] Flowsheet--depression  Nurse Triage Disposition:  Emergency Department and Patient refused or declined instruction. -  I advised patient to go to ER. She initially refused and declined instruction. However, at the end of the call she stated that she would call 911 to have them come out to take her vitals and discuss transport to the ER.   Protocol Source:  Ruthine HERO., & Eldonna CANDIE Pee.). (2019). Telephone triage for oncology nurses (3rd ed.). Oncology Nursing Society.  "

## 2024-03-12 NOTE — Telephone Encounter (Signed)
 Pt called to r/s appt - pt confirmed new date/time - pt stated that she was having problems urinating and felt dehydrated - transferred call to triage after r/s - [facility]

## 2024-03-15 ENCOUNTER — Inpatient Hospital Stay: Attending: Oncology

## 2024-03-15 DIAGNOSIS — Z7985 Long-term (current) use of injectable non-insulin antidiabetic drugs: Secondary | ICD-10-CM | POA: Insufficient documentation

## 2024-03-15 DIAGNOSIS — N1831 Chronic kidney disease, stage 3a: Secondary | ICD-10-CM | POA: Diagnosis not present

## 2024-03-15 DIAGNOSIS — C9212 Chronic myeloid leukemia, BCR/ABL-positive, in relapse: Secondary | ICD-10-CM | POA: Diagnosis present

## 2024-03-15 DIAGNOSIS — C9210 Chronic myeloid leukemia, BCR/ABL-positive, not having achieved remission: Secondary | ICD-10-CM

## 2024-03-15 DIAGNOSIS — R5383 Other fatigue: Secondary | ICD-10-CM | POA: Diagnosis not present

## 2024-03-15 DIAGNOSIS — R61 Generalized hyperhidrosis: Secondary | ICD-10-CM | POA: Insufficient documentation

## 2024-03-15 DIAGNOSIS — N390 Urinary tract infection, site not specified: Secondary | ICD-10-CM | POA: Insufficient documentation

## 2024-03-15 LAB — CBC WITH DIFFERENTIAL (CANCER CENTER ONLY)
Abs Immature Granulocytes: 0.01 K/uL (ref 0.00–0.07)
Basophils Absolute: 0 K/uL (ref 0.0–0.1)
Basophils Relative: 1 %
Eosinophils Absolute: 0.2 K/uL (ref 0.0–0.5)
Eosinophils Relative: 4 %
HCT: 42 % (ref 36.0–46.0)
Hemoglobin: 14 g/dL (ref 12.0–15.0)
Immature Granulocytes: 0 %
Lymphocytes Relative: 36 %
Lymphs Abs: 1.8 K/uL (ref 0.7–4.0)
MCH: 27.7 pg (ref 26.0–34.0)
MCHC: 33.3 g/dL (ref 30.0–36.0)
MCV: 83 fL (ref 80.0–100.0)
Monocytes Absolute: 0.4 K/uL (ref 0.1–1.0)
Monocytes Relative: 7 %
Neutro Abs: 2.7 K/uL (ref 1.7–7.7)
Neutrophils Relative %: 52 %
Platelet Count: 232 K/uL (ref 150–400)
RBC: 5.06 MIL/uL (ref 3.87–5.11)
RDW: 13.3 % (ref 11.5–15.5)
WBC Count: 5.1 K/uL (ref 4.0–10.5)
nRBC: 0 % (ref 0.0–0.2)

## 2024-03-15 LAB — CMP (CANCER CENTER ONLY)
ALT: 10 U/L (ref 0–44)
AST: 23 U/L (ref 15–41)
Albumin: 4 g/dL (ref 3.5–5.0)
Alkaline Phosphatase: 60 U/L (ref 38–126)
Anion gap: 10 (ref 5–15)
BUN: 14 mg/dL (ref 8–23)
CO2: 25 mmol/L (ref 22–32)
Calcium: 9.6 mg/dL (ref 8.9–10.3)
Chloride: 101 mmol/L (ref 98–111)
Creatinine: 0.9 mg/dL (ref 0.44–1.00)
GFR, Estimated: >60 mL/min
Glucose, Bld: 101 mg/dL — ABNORMAL HIGH (ref 70–99)
Potassium: 4 mmol/L (ref 3.5–5.1)
Sodium: 137 mmol/L (ref 135–145)
Total Bilirubin: 0.4 mg/dL (ref 0.0–1.2)
Total Protein: 6.7 g/dL (ref 6.5–8.1)

## 2024-03-18 LAB — BCR-ABL1 FISH
Cells Analyzed: 200
Cells Counted: 200

## 2024-03-22 ENCOUNTER — Inpatient Hospital Stay: Admitting: Oncology

## 2024-03-22 ENCOUNTER — Encounter: Payer: Self-pay | Admitting: Oncology

## 2024-03-22 DIAGNOSIS — J069 Acute upper respiratory infection, unspecified: Secondary | ICD-10-CM

## 2024-03-22 DIAGNOSIS — C9210 Chronic myeloid leukemia, BCR/ABL-positive, not having achieved remission: Secondary | ICD-10-CM

## 2024-03-22 DIAGNOSIS — C9212 Chronic myeloid leukemia, BCR/ABL-positive, in relapse: Secondary | ICD-10-CM | POA: Diagnosis not present

## 2024-03-22 DIAGNOSIS — Z5111 Encounter for antineoplastic chemotherapy: Secondary | ICD-10-CM

## 2024-03-22 NOTE — Assessment & Plan Note (Signed)
 Supportive care.  Recommend patient to discuss with primary care provider if symptom gets worse.

## 2024-03-22 NOTE — Assessment & Plan Note (Signed)
#  CML, chronic phase, 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 Continue bosutinib 400mg  daily

## 2024-03-22 NOTE — Progress Notes (Signed)
 HEMATOLOGY-ONCOLOGY TeleHEALTH VISIT PROGRESS NOTE  I connected with Tara Levine on 03/22/24  at  2:45 PM EST by video enabled telemedicine visit and verified that I am speaking with the correct person using two identifiers. I discussed the limitations, risks, security and privacy concerns of performing an evaluation and management service by telemedicine and the availability of in-person appointments. The patient expressed understanding and agreed to proceed.   Other persons participating in the visit and their role in the encounter:  None  Patient's location: Home  Provider's location: office Chief Complaint: CML   INTERVAL HISTORY Tara Levine is a 69 y.o. female who has above history reviewed by me today presents for follow up visit for management of CML Discussed the use of AI scribe software for clinical note transcription with the patient, who gave verbal consent to proceed.   Recent CML blood work from January 19th was negative, with one component pending.  She developed symptoms of an acute upper respiratory infection a few days prior to the visit, characterized by nasal congestion and productive cough. She denies shortness of breath beyond her baseline and has not undergone testing for COVID-19 or influenza. She notes that early spring congestion tends to exacerbate her symptoms.  Renal function has normalized, with creatinine at 0.9 mg/dL and estimated glomerular filtration rate above 50, both improved compared to three and six months prior.   Review of Systems  Constitutional:  Positive for fatigue. Negative for appetite change, chills and fever.  HENT:   Negative for hearing loss and voice change.        Nasal congestion.  Eyes:  Negative for eye problems.  Respiratory:  Positive for cough. Negative for chest tightness.   Cardiovascular:  Negative for chest pain.       CHF  Gastrointestinal:  Negative for abdominal distention, abdominal pain and blood in stool.   Endocrine: Negative for hot flashes.  Genitourinary:  Negative for difficulty urinating and frequency.        Stress incontinence  Musculoskeletal:  Positive for arthralgias.  Skin:  Negative for itching and rash.  Neurological:  Negative for extremity weakness.       Balance problem  Hematological:  Negative for adenopathy.  Psychiatric/Behavioral:  Positive for decreased concentration and depression. Negative for confusion.     Past Medical History:  Diagnosis Date   CML (chronic myelocytic leukemia) (HCC)    Depression    DM type 2 (diabetes mellitus, type 2) (HCC)    Environmental allergies    History of gastric ulcer    HLD (hyperlipidemia)    IDA (iron deficiency anemia)    chronic   Leukemia (HCC)    Leukemia, chronic myeloid (HCC)    Osteoarthritis    Panic disorder    PSS (progressive systemic sclerosis) (HCC)    Unspecified essential hypertension    Past Surgical History:  Procedure Laterality Date   back and neck surgery  1999, 2009   had rods placed   CARDIAC CATHETERIZATION  1990   [place]  [facility]) No blackages found.    CARPAL TUNNEL RELEASE     PARTIAL HYSTERECTOMY     age 71, no cancer    Family History  Problem Relation Age of Onset   Diabetes Mother    Hypertension Mother    Heart disease Mother    Prostate cancer Father    Heart disease Father    Diabetes Brother     Social History   Socioeconomic History  Marital status: Widowed    Spouse name: Not on file   Number of children: Not on file   Years of education: Not on file   Highest education level: Not on file  Occupational History   Not on file  Tobacco Use   Smoking status: Never   Smokeless tobacco: Never   Tobacco comments:    quit 1985  Vaping Use   Vaping status: Never Used  Substance and Sexual Activity   Alcohol  use: No    Alcohol /week: 0.0 standard drinks of alcohol    Drug use: No   Sexual activity: Not Currently  Other Topics Concern   Not on file   Social History Narrative   Not on file   Social Drivers of Health   Tobacco Use: Low Risk  (03/15/2024)   Received from [facility]   Patient History    Smoking Tobacco Use: Never    Smokeless Tobacco Use: Never    Passive Exposure: Not on file  Financial Resource Strain: Low Risk  (03/15/2024)   Received from [facility]   Overall Financial Resource Strain (CARDIA)    Difficulty of Paying Living Expenses: Not very hard  Food Insecurity: No Food Insecurity (03/15/2024)   Received from [facility]   Epic    Within the past 12 months, you worried that your food would run out before you got the money to buy more.: Never true    Within the past 12 months, the food you bought just didn't last and you didn't have money to get more.: Never true  Transportation Needs: No Transportation Needs (03/15/2024)   Received from [facility] - Transportation    In the past 12 months, has lack of transportation kept you from medical appointments or from getting medications?: No    Lack of Transportation (Non-Medical): No  Physical Activity: Not on file  Stress: Not on file  Social Connections: Not on file  Intimate Partner Violence: Patient Declined (03/12/2024)   Epic    Fear of Current or Ex-Partner: Patient declined    Emotionally Abused: Patient declined    Physically Abused: Patient declined    Sexually Abused: Patient declined  Depression (PHQ2-9): Medium Risk (01/09/2023)   Depression (PHQ2-9)    PHQ-2 Score: 9  Alcohol  Screen: Not on file  Housing: Low Risk  (03/15/2024)   Received from [facility]   Epic    In the last 12 months, was there a time when you were not able to pay the mortgage or rent on time?: No    In the past 12 months, how many times have you moved where you were living?: 0    At any time in the past 12 months, were you homeless or living in a shelter (including now)?: No   Utilities: Not At Risk (03/15/2024)   Received from [facility]   Epic    In the past 12 months has the electric, gas, oil, or water company threatened to shut off services in your home?: No  Health Literacy: Not on file    Medications Ordered Prior to Encounter[1]  Allergies[2]     Observations/Objective: There were no vitals filed for this visit. There is no height or weight on file to calculate BMI.  Physical Exam Neurological:     Mental Status: She is alert.     CBC    Component Value Date/Time   WBC 5.1 03/15/2024  1217   WBC 9.5 09/16/2023 1338   RBC 5.06 03/15/2024 1217   HGB 14.0 03/15/2024 1217   HGB 12.4 03/02/2014 1411   HCT 42.0 03/15/2024 1217   HCT 37.6 03/02/2014 1411   PLT 232 03/15/2024 1217   PLT 234 03/02/2014 1411   MCV 83.0 03/15/2024 1217   MCV 87 03/02/2014 1411   MCH 27.7 03/15/2024 1217   MCHC 33.3 03/15/2024 1217   RDW 13.3 03/15/2024 1217   RDW 14.3 03/02/2014 1411   LYMPHSABS 1.8 03/15/2024 1217   LYMPHSABS 1.6 03/02/2014 1411   MONOABS 0.4 03/15/2024 1217   MONOABS 0.4 03/02/2014 1411   EOSABS 0.2 03/15/2024 1217   EOSABS 0.1 03/02/2014 1411   BASOSABS 0.0 03/15/2024 1217   BASOSABS 0.0 03/02/2014 1411    CMP     Component Value Date/Time   NA 137 03/15/2024 1217   NA 136 03/02/2014 1411   K 4.0 03/15/2024 1217   K 4.0 03/02/2014 1411   CL 101 03/15/2024 1217   CL 97 (L) 03/02/2014 1411   CO2 25 03/15/2024 1217   CO2 32 03/02/2014 1411   GLUCOSE 101 (H) 03/15/2024 1217   GLUCOSE 141 (H) 03/02/2014 1411   BUN 14 03/15/2024 1217   BUN 18 03/02/2014 1411   CREATININE 0.90 03/15/2024 1217   CREATININE 0.99 05/22/2021 1446   CALCIUM  9.6 03/15/2024 1217   CALCIUM  9.1 03/02/2014 1411   PROT 6.7 03/15/2024 1217   PROT 7.1 03/02/2014 1411   ALBUMIN 4.0 03/15/2024 1217   ALBUMIN 2.9 (L) 03/02/2014 1411   AST 23 03/15/2024 1217   ALT 10 03/15/2024 1217   ALT 21 03/02/2014 1411   ALKPHOS 60 03/15/2024 1217    ALKPHOS 69 03/02/2014 1411   BILITOT 0.4 03/15/2024 1217   GFRNONAA >60 03/15/2024 1217   GFRNONAA 46 (L) 03/02/2014 1411   GFRNONAA >60 11/11/2013 1451   GFRAA >60 06/22/2019 2151   GFRAA 56 (L) 03/02/2014 1411   GFRAA >60 11/11/2013 1451      ASSESSMENT & PLAN:   CML (chronic myelocytic leukemia) (HCC) #CML, chronic phase, 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 Continue bosutinib 400mg  daily    Encounter for antineoplastic chemotherapy Treatment plan as listed above.   URI (upper respiratory infection) Supportive care.  Recommend patient to discuss with primary care provider if symptom gets worse.  No orders of the defined types were placed in this encounter.    I discussed the assessment and treatment plan with the patient. The patient was provided an opportunity to ask questions and all were answered. The patient agreed with the plan and demonstrated an understanding of the instructions.  The patient was advised to call back or seek an in-person evaluation if the symptoms worsen or if the condition fails to improve as anticipated.    Zelphia Cap, MD 03/22/2024 3:06 PM      [1]  Current Outpatient Medications on File Prior to Visit  Medication Sig Dispense Refill   ACCU-CHEK GUIDE test strip TEST DAILY AND AS DIRECTED. 300 strip 10   Accu-Chek Softclix Lancets lancets TEST BLOOD SUGAR THREE TIMES DAILY AS INSTRUCTED 300 each 10   albuterol  (PROVENTIL  HFA;VENTOLIN  HFA) 108 (90 BASE) MCG/ACT inhaler Inhale 2 puffs into the lungs every 6 (six) hours as needed for wheezing or shortness of breath.     albuterol  (PROVENTIL ) (2.5 MG/3ML) 0.083% nebulizer solution INHALE CONTENTS OF 1 VIAL VIA NEBULIZER THREE TIMES A DAY prn  Alcohol  Swabs  (DROPSAFE ALCOHOL  PREP) 70 % PADS USE THREE TIMES DAILY  TO CHECK BLOOD SUGAR 300 each 10   amLODipine  (NORVASC ) 5 MG tablet Take 1 tablet (5 mg total) by mouth daily. (Patient not taking: Reported  on 03/12/2024) 90 tablet 0   Ascorbic Acid  (VITAMIN C) 1000 MG tablet Take 3,000 mg by mouth daily.     atorvastatin  (LIPITOR) 20 MG tablet Take 1 tablet (20 mg total) by mouth daily. 90 tablet 3   baclofen (LIORESAL) 10 MG tablet Take 10 mg by mouth 2 (two) times daily. 1/2 tab     Blood Glucose Monitoring Suppl (GLUCOCOM BLOOD GLUCOSE MONITOR) DEVI Accu -chek 1 each 0   bosutinib (BOSULIF ) 100 MG tablet Take 4 tablets (400 mg total) by mouth daily with breakfast. Take with food. (Patient not taking: Reported on 03/12/2024) 120 tablet 3   celecoxib  (CELEBREX ) 100 MG capsule  (Patient not taking: Reported on 03/12/2024)     cetirizine (ZYRTEC) 10 MG tablet Take 20 mg by mouth daily as needed.      Cholecalciferol  (VITAMIN D  PO) Take 1 tablet by mouth daily.     co-enzyme Q-10 50 MG capsule Take 50 mg by mouth daily.     Continuous Blood Gluc Receiver (DEXCOM G7 RECEIVER) DEVI 1 each by Does not apply route every 14 (fourteen) days. (Patient not taking: Reported on 03/12/2024) 1 each 3   Continuous Blood Gluc Sensor (DEXCOM G7 SENSOR) MISC 1 each by Does not apply route every 14 (fourteen) days. (Patient not taking: Reported on 03/12/2024) 12 each 1   DULoxetine  (CYMBALTA ) 20 MG capsule Take 1 capsule (20 mg total) by mouth in the morning, at noon, in the evening, and at bedtime. 120 capsule 3   empagliflozin  (JARDIANCE ) 25 MG TABS tablet Take 1 tablet (25 mg total) by mouth daily before breakfast. (Patient not taking: Reported on 03/12/2024) 30 tablet 3   fluticasone  (FLONASE ) 50 MCG/ACT nasal spray Place 1-2 sprays into both nostrils as needed for rhinitis.      furosemide  (LASIX ) 40 MG tablet Take 1 tablet (40 mg total) by mouth daily. (Patient not taking: Reported on 03/12/2024) 30 tablet 3   insulin  aspart (NOVOLOG ) 100 UNIT/ML injection Inject 10 Units into the skin 3 (three) times daily as needed for high blood sugar. Pt reports she is taking 2-8 units per sliding scale based on glucose     NOVOLOG   FLEXPEN 100 UNIT/ML FlexPen Inject into the skin. (Patient not taking: Reported on 03/12/2024)     nystatin  (MYCOSTATIN /NYSTOP ) powder Apply 1 Application topically 3 (three) times daily. (Patient not taking: Reported on 03/12/2024) 60 g 2   Omega-3 Fatty Acids (OMEGA 3 PO) Take 3 capsules by mouth daily. (Patient not taking: Reported on 03/12/2024)     ondansetron  (ZOFRAN -ODT) 4 MG disintegrating tablet Take 1 tablet (4 mg total) by mouth every 8 (eight) hours as needed for nausea or vomiting. (Patient not taking: Reported on 03/12/2024) 20 tablet 0   Oxycodone  HCl 10 MG TABS Take 10 mg by mouth as needed.     pantoprazole  (PROTONIX ) 40 MG tablet TAKE 1 TABLET BY MOUTH EVERY DAY (Patient not taking: Reported on 03/12/2024) 90 tablet 1   polyethylene glycol (MIRALAX  MIX-IN PAX) 17 g packet Take 17 g by mouth daily as needed. (Patient not taking: Reported on 03/12/2024) 14 each 0   pregabalin  (LYRICA ) 300 MG capsule Take 1 capsule (300 mg total) by mouth 2 (two) times daily. 60 capsule 5  Semaglutide , 1 MG/DOSE, 4 MG/3ML SOPN Inject 1 mg as directed once a week. 3 mL 0   sucralfate  (CARAFATE ) 1 GM/10ML suspension Take 10 mLs (1 g total) by mouth 4 (four) times daily. (Patient not taking: Reported on 03/12/2024) 420 mL 1   vitamin B-12 (CYANOCOBALAMIN ) 1000 MCG tablet Take 4,000 mcg by mouth daily.     No current facility-administered medications on file prior to visit.  [2]  Allergies Allergen Reactions   Morphine  Rash    Headache too   Codeine  Other (See Comments)    Reaction: Unknown    Latex Other (See Comments)    Reaction:  Unknown    Morphine  And Codeine  Other (See Comments)    Reaction:  Unknown    Relafen [Nabumetone] Hives   Nsaids Rash   Tape Other (See Comments) and Rash    Reaction:  Unknown

## 2024-03-22 NOTE — Assessment & Plan Note (Signed)
 Treatment plan as listed above.

## 2024-03-23 LAB — BCR-ABL1, CML/ALL, PCR, QUANT
E1A2 Transcript: <0.0032 %
b2a2 transcript: <0.0032 %
b3a2 transcript: 0.0377 %

## 2024-03-25 ENCOUNTER — Inpatient Hospital Stay: Admitting: Oncology

## 2024-06-18 ENCOUNTER — Inpatient Hospital Stay

## 2024-07-02 ENCOUNTER — Inpatient Hospital Stay: Admitting: Oncology
